# Patient Record
Sex: Female | Born: 1975 | Race: Black or African American | Hispanic: No | Marital: Single | Smoking: Former smoker
Health system: Southern US, Community
[De-identification: ages and names within clinical notes are randomized; demographics above are authoritative.]

## PROBLEM LIST (undated history)

## (undated) DIAGNOSIS — I2699 Other pulmonary embolism without acute cor pulmonale: Secondary | ICD-10-CM

## (undated) DIAGNOSIS — F329 Major depressive disorder, single episode, unspecified: Secondary | ICD-10-CM

## (undated) DIAGNOSIS — E559 Vitamin D deficiency, unspecified: Secondary | ICD-10-CM

## (undated) DIAGNOSIS — K5900 Constipation, unspecified: Secondary | ICD-10-CM

## (undated) DIAGNOSIS — Z91018 Allergy to other foods: Secondary | ICD-10-CM

## (undated) DIAGNOSIS — Z8489 Family history of other specified conditions: Secondary | ICD-10-CM

## (undated) DIAGNOSIS — G35 Multiple sclerosis: Secondary | ICD-10-CM

## (undated) DIAGNOSIS — F419 Anxiety disorder, unspecified: Secondary | ICD-10-CM

## (undated) DIAGNOSIS — G35D Multiple sclerosis, unspecified: Secondary | ICD-10-CM

## (undated) DIAGNOSIS — R001 Bradycardia, unspecified: Secondary | ICD-10-CM

## (undated) DIAGNOSIS — F988 Other specified behavioral and emotional disorders with onset usually occurring in childhood and adolescence: Secondary | ICD-10-CM

## (undated) DIAGNOSIS — M1990 Unspecified osteoarthritis, unspecified site: Secondary | ICD-10-CM

## (undated) DIAGNOSIS — R600 Localized edema: Secondary | ICD-10-CM

## (undated) DIAGNOSIS — E669 Obesity, unspecified: Secondary | ICD-10-CM

## (undated) DIAGNOSIS — F32A Depression, unspecified: Secondary | ICD-10-CM

## (undated) DIAGNOSIS — R569 Unspecified convulsions: Secondary | ICD-10-CM

## (undated) DIAGNOSIS — K219 Gastro-esophageal reflux disease without esophagitis: Secondary | ICD-10-CM

## (undated) DIAGNOSIS — R519 Headache, unspecified: Secondary | ICD-10-CM

## (undated) DIAGNOSIS — K829 Disease of gallbladder, unspecified: Secondary | ICD-10-CM

## (undated) DIAGNOSIS — I10 Essential (primary) hypertension: Secondary | ICD-10-CM

## (undated) HISTORY — DX: Localized edema: R60.0

## (undated) HISTORY — DX: Depression, unspecified: F32.A

## (undated) HISTORY — DX: Major depressive disorder, single episode, unspecified: F32.9

## (undated) HISTORY — DX: Obesity, unspecified: E66.9

## (undated) HISTORY — DX: Bradycardia, unspecified: R00.1

## (undated) HISTORY — DX: Anxiety disorder, unspecified: F41.9

## (undated) HISTORY — DX: Other specified behavioral and emotional disorders with onset usually occurring in childhood and adolescence: F98.8

## (undated) HISTORY — DX: Essential (primary) hypertension: I10

## (undated) HISTORY — PX: CHOLECYSTECTOMY: SHX55

## (undated) HISTORY — PX: KNEE SURGERY: SHX244

## (undated) HISTORY — DX: Allergy to other foods: Z91.018

## (undated) HISTORY — DX: Multiple sclerosis: G35

## (undated) HISTORY — DX: Disease of gallbladder, unspecified: K82.9

## (undated) HISTORY — DX: Constipation, unspecified: K59.00

## (undated) HISTORY — DX: Vitamin D deficiency, unspecified: E55.9

## (undated) HISTORY — DX: Unspecified osteoarthritis, unspecified site: M19.90

## (undated) HISTORY — DX: Multiple sclerosis, unspecified: G35.D

---

## 1999-02-06 ENCOUNTER — Emergency Department (HOSPITAL_COMMUNITY): Admission: EM | Admit: 1999-02-06 | Discharge: 1999-02-07 | Payer: Self-pay | Admitting: Emergency Medicine

## 1999-02-07 ENCOUNTER — Encounter: Payer: Self-pay | Admitting: Emergency Medicine

## 2001-04-18 ENCOUNTER — Other Ambulatory Visit: Admission: RE | Admit: 2001-04-18 | Discharge: 2001-04-18 | Payer: Self-pay | Admitting: Obstetrics and Gynecology

## 2001-12-14 ENCOUNTER — Inpatient Hospital Stay (HOSPITAL_COMMUNITY): Admission: AD | Admit: 2001-12-14 | Discharge: 2001-12-14 | Payer: Self-pay | Admitting: Obstetrics

## 2001-12-15 ENCOUNTER — Inpatient Hospital Stay (HOSPITAL_COMMUNITY): Admission: AD | Admit: 2001-12-15 | Discharge: 2001-12-15 | Payer: Self-pay | Admitting: Obstetrics & Gynecology

## 2001-12-17 ENCOUNTER — Inpatient Hospital Stay (HOSPITAL_COMMUNITY): Admission: AD | Admit: 2001-12-17 | Discharge: 2001-12-17 | Payer: Self-pay | Admitting: *Deleted

## 2012-09-19 DIAGNOSIS — R569 Unspecified convulsions: Secondary | ICD-10-CM | POA: Insufficient documentation

## 2012-09-19 DIAGNOSIS — G40909 Epilepsy, unspecified, not intractable, without status epilepticus: Secondary | ICD-10-CM | POA: Insufficient documentation

## 2012-09-20 DIAGNOSIS — G43909 Migraine, unspecified, not intractable, without status migrainosus: Secondary | ICD-10-CM | POA: Insufficient documentation

## 2014-03-05 DIAGNOSIS — F1910 Other psychoactive substance abuse, uncomplicated: Secondary | ICD-10-CM | POA: Insufficient documentation

## 2014-05-31 ENCOUNTER — Ambulatory Visit: Payer: Medicaid Other | Attending: Specialist

## 2014-05-31 DIAGNOSIS — G35 Multiple sclerosis: Secondary | ICD-10-CM | POA: Diagnosis not present

## 2014-05-31 DIAGNOSIS — G8190 Hemiplegia, unspecified affecting unspecified side: Secondary | ICD-10-CM | POA: Insufficient documentation

## 2014-05-31 DIAGNOSIS — IMO0001 Reserved for inherently not codable concepts without codable children: Secondary | ICD-10-CM | POA: Insufficient documentation

## 2014-08-30 ENCOUNTER — Emergency Department (HOSPITAL_COMMUNITY)
Admission: EM | Admit: 2014-08-30 | Discharge: 2014-08-30 | Disposition: A | Discharge status: Home / Self Care | Payer: Medicaid Other | Attending: Emergency Medicine | Admitting: Emergency Medicine

## 2014-08-30 ENCOUNTER — Emergency Department (HOSPITAL_COMMUNITY): Payer: Medicaid Other

## 2014-08-30 ENCOUNTER — Encounter (HOSPITAL_COMMUNITY): Payer: Self-pay | Admitting: Emergency Medicine

## 2014-08-30 DIAGNOSIS — Z87891 Personal history of nicotine dependence: Secondary | ICD-10-CM | POA: Insufficient documentation

## 2014-08-30 DIAGNOSIS — S0093XA Contusion of unspecified part of head, initial encounter: Secondary | ICD-10-CM

## 2014-08-30 DIAGNOSIS — S0003XA Contusion of scalp, initial encounter: Secondary | ICD-10-CM | POA: Insufficient documentation

## 2014-08-30 DIAGNOSIS — S0990XA Unspecified injury of head, initial encounter: Secondary | ICD-10-CM | POA: Diagnosis present

## 2014-08-30 DIAGNOSIS — Y92009 Unspecified place in unspecified non-institutional (private) residence as the place of occurrence of the external cause: Secondary | ICD-10-CM | POA: Insufficient documentation

## 2014-08-30 DIAGNOSIS — Z8669 Personal history of other diseases of the nervous system and sense organs: Secondary | ICD-10-CM | POA: Diagnosis not present

## 2014-08-30 DIAGNOSIS — Z3202 Encounter for pregnancy test, result negative: Secondary | ICD-10-CM | POA: Insufficient documentation

## 2014-08-30 DIAGNOSIS — R7989 Other specified abnormal findings of blood chemistry: Secondary | ICD-10-CM | POA: Insufficient documentation

## 2014-08-30 DIAGNOSIS — R259 Unspecified abnormal involuntary movements: Secondary | ICD-10-CM | POA: Diagnosis not present

## 2014-08-30 DIAGNOSIS — S1093XA Contusion of unspecified part of neck, initial encounter: Principal | ICD-10-CM

## 2014-08-30 DIAGNOSIS — Y9389 Activity, other specified: Secondary | ICD-10-CM | POA: Insufficient documentation

## 2014-08-30 DIAGNOSIS — S0083XA Contusion of other part of head, initial encounter: Principal | ICD-10-CM | POA: Insufficient documentation

## 2014-08-30 DIAGNOSIS — W010XXA Fall on same level from slipping, tripping and stumbling without subsequent striking against object, initial encounter: Secondary | ICD-10-CM | POA: Diagnosis not present

## 2014-08-30 DIAGNOSIS — F1110 Opioid abuse, uncomplicated: Secondary | ICD-10-CM | POA: Insufficient documentation

## 2014-08-30 DIAGNOSIS — R945 Abnormal results of liver function studies: Secondary | ICD-10-CM

## 2014-08-30 DIAGNOSIS — W19XXXA Unspecified fall, initial encounter: Secondary | ICD-10-CM

## 2014-08-30 DIAGNOSIS — G8190 Hemiplegia, unspecified affecting unspecified side: Secondary | ICD-10-CM

## 2014-08-30 DIAGNOSIS — N390 Urinary tract infection, site not specified: Secondary | ICD-10-CM | POA: Diagnosis not present

## 2014-08-30 HISTORY — DX: Unspecified convulsions: R56.9

## 2014-08-30 LAB — URINALYSIS, ROUTINE W REFLEX MICROSCOPIC
Bilirubin Urine: NEGATIVE
GLUCOSE, UA: NEGATIVE mg/dL
HGB URINE DIPSTICK: NEGATIVE
Ketones, ur: NEGATIVE mg/dL
Nitrite: POSITIVE — AB
PH: 6.5 (ref 5.0–8.0)
PROTEIN: NEGATIVE mg/dL
Specific Gravity, Urine: 1.013 (ref 1.005–1.030)
Urobilinogen, UA: 1 mg/dL (ref 0.0–1.0)

## 2014-08-30 LAB — DIFFERENTIAL
Basophils Absolute: 0 10*3/uL (ref 0.0–0.1)
Basophils Relative: 0 % (ref 0–1)
EOS PCT: 0 % (ref 0–5)
Eosinophils Absolute: 0 10*3/uL (ref 0.0–0.7)
LYMPHS PCT: 20 % (ref 12–46)
Lymphs Abs: 1.5 10*3/uL (ref 0.7–4.0)
Monocytes Absolute: 0.7 10*3/uL (ref 0.1–1.0)
Monocytes Relative: 10 % (ref 3–12)
Neutro Abs: 5.1 10*3/uL (ref 1.7–7.7)
Neutrophils Relative %: 70 % (ref 43–77)

## 2014-08-30 LAB — COMPREHENSIVE METABOLIC PANEL
ALT: 115 U/L — ABNORMAL HIGH (ref 0–35)
AST: 254 U/L — AB (ref 0–37)
Albumin: 3.5 g/dL (ref 3.5–5.2)
Alkaline Phosphatase: 72 U/L (ref 39–117)
Anion gap: 9 (ref 5–15)
BUN: 9 mg/dL (ref 6–23)
CALCIUM: 8.5 mg/dL (ref 8.4–10.5)
CO2: 27 mEq/L (ref 19–32)
Chloride: 102 mEq/L (ref 96–112)
Creatinine, Ser: 0.94 mg/dL (ref 0.50–1.10)
GFR calc Af Amer: 89 mL/min — ABNORMAL LOW (ref >90)
GFR calc non Af Amer: 77 mL/min — ABNORMAL LOW (ref >90)
Glucose, Bld: 91 mg/dL (ref 70–99)
Potassium: 3.7 mEq/L (ref 3.7–5.3)
SODIUM: 138 meq/L (ref 137–147)
TOTAL PROTEIN: 6.6 g/dL (ref 6.0–8.3)
Total Bilirubin: 0.8 mg/dL (ref 0.3–1.2)

## 2014-08-30 LAB — I-STAT TROPONIN, ED: TROPONIN I, POC: 0 ng/mL (ref 0.00–0.08)

## 2014-08-30 LAB — PROTIME-INR
INR: 1.16 (ref 0.00–1.49)
PROTHROMBIN TIME: 14.8 s (ref 11.6–15.2)

## 2014-08-30 LAB — URINE MICROSCOPIC-ADD ON

## 2014-08-30 LAB — RAPID URINE DRUG SCREEN, HOSP PERFORMED
AMPHETAMINES: NOT DETECTED
BENZODIAZEPINES: NOT DETECTED
Barbiturates: NOT DETECTED
Cocaine: NOT DETECTED
OPIATES: POSITIVE — AB
Tetrahydrocannabinol: NOT DETECTED

## 2014-08-30 LAB — CBC
HEMATOCRIT: 37.8 % (ref 36.0–46.0)
Hemoglobin: 12.8 g/dL (ref 12.0–15.0)
MCH: 31.4 pg (ref 26.0–34.0)
MCHC: 33.9 g/dL (ref 30.0–36.0)
MCV: 92.9 fL (ref 78.0–100.0)
PLATELETS: 227 10*3/uL (ref 150–400)
RBC: 4.07 MIL/uL (ref 3.87–5.11)
RDW: 13.8 % (ref 11.5–15.5)
WBC: 7.3 10*3/uL (ref 4.0–10.5)

## 2014-08-30 LAB — POC URINE PREG, ED: Preg Test, Ur: NEGATIVE

## 2014-08-30 LAB — APTT: aPTT: 29 seconds (ref 24–37)

## 2014-08-30 MED ORDER — LORAZEPAM 1 MG PO TABS
0.5000 mg | ORAL_TABLET | Freq: Two times a day (BID) | ORAL | Status: DC
  Filled 2014-08-30: qty 1

## 2014-08-30 MED ORDER — DEXTROSE 5 % IV SOLN
1.0000 g | INTRAVENOUS | Status: DC
  Administered 2014-08-30: 1 g via INTRAVENOUS
  Filled 2014-08-30: qty 10

## 2014-08-30 MED ORDER — OXYCODONE HCL 5 MG PO TABS: 5.0000 mg | ORAL_TABLET | ORAL | Status: DC | PRN

## 2014-08-30 MED ORDER — LORAZEPAM 1 MG PO TABS
1.0000 mg | ORAL_TABLET | Freq: Once | ORAL | Status: AC
  Administered 2014-08-30: 1 mg via ORAL

## 2014-08-30 MED ORDER — DIPHENHYDRAMINE HCL 50 MG/ML IJ SOLN
25.0000 mg | Freq: Once | INTRAMUSCULAR | Status: AC
  Administered 2014-08-30: 25 mg via INTRAVENOUS
  Filled 2014-08-30: qty 1

## 2014-08-30 MED ORDER — MORPHINE SULFATE 4 MG/ML IJ SOLN
4.0000 mg | Freq: Once | INTRAMUSCULAR | Status: AC
  Administered 2014-08-30: 4 mg via INTRAVENOUS
  Filled 2014-08-30: qty 1

## 2014-08-30 MED ORDER — LORAZEPAM 1 MG PO TABS
1.0000 mg | ORAL_TABLET | Freq: Two times a day (BID) | ORAL | Status: DC
  Filled 2014-08-30: qty 1

## 2014-08-30 MED ORDER — ESCITALOPRAM OXALATE 10 MG PO TABS
20.0000 mg | ORAL_TABLET | Freq: Every day | ORAL | Status: DC
  Administered 2014-08-30: 20 mg via ORAL
  Filled 2014-08-30: qty 2

## 2014-08-30 MED ORDER — CEPHALEXIN 500 MG PO CAPS: 500.0000 mg | ORAL_CAPSULE | Freq: Two times a day (BID) | ORAL | Status: DC

## 2014-08-30 NOTE — ED Notes (Signed)
Pt states she was in her bathroom when she fell into the tub hitting her head on the soap holder. Pt now c/o pain in the back of her head. Pt has C-Collar in place.

## 2014-08-30 NOTE — ED Provider Notes (Addendum)
Patient presented to the ER with fall and head injury. Patient reports falling in the shower earlier today, hitting the left side of her head on the soap holder. No loss of consciousness. Patient with headache and left-sided neck pain.  Face to face Exam: HEENT - PERRLA, contusion left occiput Lungs - CTAB Heart - RRR, no M/R/G Abd - S/NT/ND Neuro - alert, oriented x3. Lateral nystagmus, plus minus rotational nystagmus, difficulty with left lateral gaze in the left eye, possible deficit  Plan:  Present to the ER for evaluation of possible head injury. She had a fall this morning, causing fall is unclear at this time. Patient complaining of headache and neck pain. CT scan of head and neck were unremarkable. Examination, however, reveal some nystagmus the patient reports that she has been having double vision and feeling like her Hyzaar causing. She does have nystagmus and some irritability with leftward gaze in the left eye. Patient does, however, have a previous history of CVA. Obtain neurology consult for further evaluation.  Procedure: Venapuncture Area was prepped with chlorhexidine. Right antecubital fossa vein was identified using ultrasound guidance. 20-gauge butterfly was advanced under visualization with ultrasound into the vein, 10 mL so blood was collected. The patient tolerated procedure, no complications.  Gilda Crease, MD 08/30/14 1113  Gilda Crease, MD 08/30/14 [phone]

## 2014-08-30 NOTE — Consult Note (Signed)
Referring Physician: Pollina    Chief Complaint: fall with symptoms of right leg weakness   HPI:                                                                                                                                         Ebony Warren is an 38 y.o. female who presents to ED after she fell into her bath tube. Patient states she had a stroke in 2008 with residual right leg weakness and decreased sensation but per mother she had minimal right leg weakness. In 2013 she states she had a seizure.  Her mother at bedside states she had a full neurological work up in [place] at [facility].  At that time they were told her seizures were not "from the brain but stress induced."  Today she was hanging a wash cloth on a hanger located in the bath tube when she fell into the tube.  She did not lose consciousness and was alert the whole time.  She states she is unclear why she fell. After her fall she noted she had right leg weakness, double vision when looking to the left but only when she did not have her glasses on.  When glasses on, her vision was normal. Her mother noted her speech was not baseline but stuttering.  She has had similar speech problem "when under stress and each time her speech will clear up once the stress is cleared from her life"--per mother. Neurology was called to evaluate patient.   Date last known well: Date: 08/30/2014 Time last known well: Time: 08:00 tPA Given: No: minimal symptoms  Past Medical History  Diagnosis Date  . Seizures     told that they are not from the brain but stress related    Past Surgical History  Procedure Laterality Date  . Cholecystectomy    . Knee surgery      Family History  Problem Relation Age of Onset  . Hypertension Mother   . Hypertension Father    Social History:  reports that she quit smoking about 8 years ago. She does not have any smokeless tobacco history on file. She reports that she does not drink alcohol. Her drug  history is not on file.  Allergies:  Allergies  Allergen Reactions  . Percocet [Oxycodone-Acetaminophen]     Stomach cramps   . Vicodin [Hydrocodone-Acetaminophen]     Stomach cramps     Medications:  Current Facility-Administered Medications  Medication Dose Route Frequency Provider Last Rate Last Dose  . escitalopram (LEXAPRO) tablet 20 mg  20 mg Oral Daily Mellody Drown, PA-C   20 mg at 08/30/14 1117  . LORazepam (ATIVAN) tablet 1 mg  1 mg Oral BID Mellody Drown, PA-C       Current Outpatient Prescriptions  Medication Sig Dispense Refill  . acetaminophen (TYLENOL) 500 MG tablet Take 500 mg by mouth every 8 (eight) hours as needed (pain).      Marland Kitchen acetaminophen-codeine (TYLENOL #3) 300-30 MG per tablet Take 1 tablet by mouth every 6 (six) hours as needed for moderate pain.      Marland Kitchen escitalopram (LEXAPRO) 20 MG tablet Take 20 mg by mouth daily.      Marland Kitchen ibuprofen (ADVIL,MOTRIN) 800 MG tablet Take 800 mg by mouth every 8 (eight) hours as needed (pain).      . LORazepam (ATIVAN) 0.5 MG tablet Take 0.5 mg by mouth 2 (two) times daily.        ROS:                                                                                                                                       History obtained from the patient  General ROS: negative for - chills, fatigue, fever, night sweats, weight gain or weight loss Psychological ROS: negative for - behavioral disorder, hallucinations, memory difficulties, mood swings or suicidal ideation Ophthalmic ROS: negative for - blurry vision, double vision, eye pain or loss of vision ENT ROS: negative for - epistaxis, nasal discharge, oral lesions, sore throat, tinnitus or vertigo Allergy and Immunology ROS: negative for - hives or itchy/watery eyes Hematological and Lymphatic ROS: negative for - bleeding problems, bruising or swollen lymph  nodes Endocrine ROS: negative for - galactorrhea, hair pattern changes, polydipsia/polyuria or temperature intolerance Respiratory ROS: negative for - cough, hemoptysis, shortness of breath or wheezing Cardiovascular ROS: negative for - chest pain, dyspnea on exertion, edema or irregular heartbeat Gastrointestinal ROS: negative for - abdominal pain, diarrhea, hematemesis, nausea/vomiting or stool incontinence Genito-Urinary ROS: negative for - dysuria, hematuria, incontinence or urinary frequency/urgency Musculoskeletal ROS: negative for - joint swelling or muscular weakness Neurological ROS: as noted in HPI Dermatological ROS: negative for rash and skin lesion changes  Neurologic Examination:                                                                                                      Blood pressure 120/95, pulse  55, temperature 97.6 F (36.4 C), temperature source Oral, resp. rate 14, last menstrual period 08/23/2014, SpO2 100.00%.   General: NAD Mental Status: Alert, oriented, thought content appropriate.  Speech stuttering and with a lisp without evidence of aphasia.  Able to follow 3 step commands without difficulty. Cranial Nerves: II: Discs flat bilaterally; Visual fields grossly normal, pupils equal, round, reactive to light and accommodation III,IV, VI: ptosis not present, extra-ocular motions intact bilaterally V,VII: smile symmetric (initially lifted right corner of mouth but when distracted moved face symmetrically), facial light touch sensation normal bilaterally VIII: hearing normal bilaterally IX,X: gag reflex present XI: bilateral shoulder shrug XII: midline tongue extension without atrophy or fasciculations  Motor: Right : Upper extremity   5/5    Left:     Upper extremity   5/5  Lower extremity   5/5-give way weakness  Lower extremity   5/5 Tone and bulk:normal tone throughout; no atrophy noted Sensory: Pinprick and light touch decreased on the right leg  (old) Deep Tendon Reflexes:  Right: Upper Extremity   Left: Upper extremity   biceps (C-5 to C-6) 2/4   biceps (C-5 to C-6) 2/4 tricep (C7) 2/4    triceps (C7) 2/4 Brachioradialis (C6) 2/4  Brachioradialis (C6) 2/4  Lower Extremity Lower Extremity  quadriceps (L-2 to L-4) 2/4   quadriceps (L-2 to L-4) 2/4 Achilles (S1) 2/4   Achilles (S1) 2/4  Plantars: Right: downgoing   Left: downgoing Cerebellar: normal finger-to-nose,  normal heel-to-shin test  Gait: not tested.  CV: pulses palpable throughout    Lab Results: Basic Metabolic Panel: No results found for this basename: NA, K, CL, CO2, GLUCOSE, BUN, CREATININE, CALCIUM, MG, PHOS,  in the last 168 hours  Liver Function Tests: No results found for this basename: AST, ALT, ALKPHOS, BILITOT, PROT, ALBUMIN,  in the last 168 hours No results found for this basename: LIPASE, AMYLASE,  in the last 168 hours No results found for this basename: AMMONIA,  in the last 168 hours  CBC: No results found for this basename: WBC, NEUTROABS, HGB, HCT, MCV, PLT,  in the last 168 hours  Cardiac Enzymes: No results found for this basename: CKTOTAL, CKMB, CKMBINDEX, TROPONINI,  in the last 168 hours  Lipid Panel: No results found for this basename: CHOL, TRIG, HDL, CHOLHDL, VLDL, LDLCALC,  in the last 168 hours  CBG: No results found for this basename: GLUCAP,  in the last 168 hours  Microbiology: No results found for this or any previous visit.  Coagulation Studies: No results found for this basename: LABPROT, INR,  in the last 72 hours  Imaging: Ct Head Wo Contrast  08/30/2014   CLINICAL DATA:  Recent fall with head and neck pain  EXAM: CT HEAD WITHOUT CONTRAST  CT CERVICAL SPINE WITHOUT CONTRAST  TECHNIQUE: Multidetector CT imaging of the head and cervical spine was performed following the standard protocol without intravenous contrast. Multiplanar CT image reconstructions of the cervical spine were also generated.  COMPARISON:  None.   FINDINGS: CT HEAD FINDINGS  The bony calvarium is intact. The ventricles are of normal size and configuration. No findings to suggest acute hemorrhage, acute infarction or space-occupying mass lesion are noted.  CT CERVICAL SPINE FINDINGS  Seven cervical segments are well visualized. Vertebral body height is well maintained. No acute fracture or acute facet abnormality is noted. The surrounding soft tissues are within normal limits. The visualized lung apices are unremarkable.  IMPRESSION: CT of the head:  No acute intracranial abnormality  is noted.  CT of the cervical spine:  No acute abnormality noted.   Electronically Signed   By: Alcide Clever M.D.   On: 08/30/2014 09:42   Ct Cervical Spine Wo Contrast  08/30/2014   CLINICAL DATA:  Recent fall with head and neck pain  EXAM: CT HEAD WITHOUT CONTRAST  CT CERVICAL SPINE WITHOUT CONTRAST  TECHNIQUE: Multidetector CT imaging of the head and cervical spine was performed following the standard protocol without intravenous contrast. Multiplanar CT image reconstructions of the cervical spine were also generated.  COMPARISON:  None.  FINDINGS: CT HEAD FINDINGS  The bony calvarium is intact. The ventricles are of normal size and configuration. No findings to suggest acute hemorrhage, acute infarction or space-occupying mass lesion are noted.  CT CERVICAL SPINE FINDINGS  Seven cervical segments are well visualized. Vertebral body height is well maintained. No acute fracture or acute facet abnormality is noted. The surrounding soft tissues are within normal limits. The visualized lung apices are unremarkable.  IMPRESSION: CT of the head:  No acute intracranial abnormality is noted.  CT of the cervical spine:  No acute abnormality noted.   Electronically Signed   By: Alcide Clever M.D.   On: 08/30/2014 09:42    Felicie Morn PA-C [facility] [phone]  08/30/2014, 12:23 PM   Assessment: 38 y.o. female S/P fall with complaints of diplopia when looking to  the left (only when not wearing glasses), stuttering speech and right leg weakness.  Neurological examination is not static and changes with each examiner.  Head CT reviewed and shows no acute changes. Patient on no antiplatelet therapy at home.    Stroke Risk Factors - none  Recommendations: 1.  Patient without evidence of bleed or cervical cord trauma from fall.  Not clear if patient has had another ischemic event with inconsistent examination.  Patient does have risk factors.  Would perform MRI of the brain without contrast.  If MRI unremarkable for an acute event no further neurological testing is recommended.  If evidence of an acute event, admission and further work up recommended.  Will follow up results of imaging.   2.  ASA  daily  Case discussed with Darnelle Maffucci   Thana Farr, MD [facility] [phone] 08/30/2014  12:53 PM

## 2014-08-30 NOTE — Discharge Instructions (Signed)
Call for a follow up appointment with a Family or Primary Care Provider for further evaluation of your elevated liver enzymes. Call a neurologist. Return if Symptoms worsen.   Take medication as prescribed.  Drink plenty of fluid.

## 2014-08-30 NOTE — ED Notes (Signed)
IV team at bedside 

## 2014-08-30 NOTE — ED Notes (Signed)
Difficulty getting blood.  RN x 2 unsuccessful.   Phlebotomy x 2 attempts unsuccessful.

## 2014-08-30 NOTE — ED Provider Notes (Signed)
CSN: [id number]     Arrival date & time 08/30/14  0810 History   First MD Initiated Contact with Patient 08/30/14 0813     Chief Complaint  Patient presents with  . 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 Patient reports while in the shower she slipped and hit her head against the soap water. Mom reports the patient broke the dispenser with her left lower head/neck. The patient denies syncope, lightheadedness. The patient reports visual symptoms as feeling "cross eyed".  Patient denies nausea, vomiting, patient denies increase in weakness or paresthesias. Per the patient's mother the patient is at her baseline while in pain, increasing trouble with talking.  Pt denies seizure prior to fall. Normally ambulates with cane.  Patient is a 38 y.o. female presenting with fall. The history is provided by the patient. No language interpreter was used.  Fall Associated symptoms include headaches and neck pain. Pertinent negatives include no abdominal pain, chills, fever, nausea or vomiting.    Past Medical History  Diagnosis Date  . Seizures    Past Surgical History  Procedure Laterality Date  . Cholecystectomy    . Knee surgery     History reviewed. No pertinent family history. History  Substance Use Topics  . Smoking status: Former Smoker    Quit date: 12/07/2005  . Smokeless tobacco: Not on file  . Alcohol Use: No   OB History   Grav Para Term Preterm Abortions TAB SAB Ect Mult Living                 Review of Systems  Constitutional: Negative for fever and chills.  Eyes: Positive for visual disturbance.  Gastrointestinal: Negative for nausea, vomiting and abdominal pain.  Musculoskeletal: Positive for neck pain.  Skin: Negative  for wound.  Neurological: Positive for headaches. Negative for light-headedness.      Allergies  Percocet and Vicodin  Home Medications   Prior to Admission medications   Not on File   BP 113/74  Pulse 52  Temp(Src) 98.3 F (36.8 C) (Oral)  Resp 16  SpO2 100%  LMP 08/23/2014 Physical Exam  Nursing note and vitals reviewed. Constitutional: She is oriented to person, place, and time. She appears well-developed and well-nourished. She does not have a sickly appearance. She does not appear ill. Cervical collar in place.  HENT:  Head: Normocephalic. Head is with contusion. Head is without laceration.    Right Ear: No hemotympanum.  Left Ear: No hemotympanum.  Eyes: Pupils are equal, round, and reactive to light.  Pulmonary/Chest: Effort normal. No respiratory distress.  Neurological: She is alert and oriented to person, place, and time. She is not disoriented. She displays tremor. No sensory deficit. She exhibits abnormal muscle tone. She displays no seizure activity. GCS eye subscore is 4. GCS verbal subscore is 5. GCS motor subscore is 6.  Speech is hesitant and slow. Cranial Nerves:   II-Visual fields were normal.   III/IV/VI-Pupils were equal and reacted. Extraocular movements were full and conjugate. The patient had mild horizontal nystagmus with left lateral gaze of the left eye.  No nystagmus of right eye. No gaze preference. V/VII-no facial droop VIII-normal.   Motor: Right upper extremity 3/5. Right lower extremity 3/5. Left 5/5. Sensory: Normal tactile sensation to upper and loser extremities.  Cerebellar: Moderately impaired finger  with tremors bilaterally. Positive pronator drift on Right.  Skin: She is not diaphoretic.    ED Course  Procedures (including critical care time) Labs Review Labs Reviewed - No data to display  Results for orders placed during the hospital encounter of 08/30/14  URINE CULTURE      Result Value Ref Range   Specimen Description  URINE, CLEAN CATCH     Special Requests NONE     Culture  Setup Time       Value: 08/30/2014 19:38     Performed at [facility] Count       Value: >=100,000 COLONIES/ML     Performed at [facility]   Culture       Value: ESCHERICHIA COLI     Performed at [facility]   Report Status PENDING    PROTIME-INR      Result Value Ref Range   Prothrombin Time 14.8  11.6 - 15.2 seconds   INR 1.16  0.00 - 1.49  APTT      Result Value Ref Range   aPTT 29  24 - 37 seconds  CBC      Result Value Ref Range   WBC 7.3  4.0 - 10.5 K/uL   RBC 4.07  3.87 - 5.11 MIL/uL   Hemoglobin 12.8  12.0 - 15.0 g/dL   HCT 99.3  57.0 - 17.7 %   MCV 92.9  78.0 - 100.0 fL   MCH 31.4  26.0 - 34.0 pg   MCHC 33.9  30.0 - 36.0 g/dL   RDW 93.9  03.0 - 09.2 %   Platelets 227  150 - 400 K/uL  DIFFERENTIAL      Result Value Ref Range   Neutrophils Relative % 70  43 - 77 %   Neutro Abs 5.1  1.7 - 7.7 K/uL   Lymphocytes Relative 20  12 - 46 %   Lymphs Abs 1.5  0.7 - 4.0 K/uL   Monocytes Relative 10  3 - 12 %   Monocytes Absolute 0.7  0.1 - 1.0 K/uL   Eosinophils Relative 0  0 - 5 %   Eosinophils Absolute 0.0  0.0 - 0.7 K/uL   Basophils Relative 0  0 - 1 %   Basophils Absolute 0.0  0.0 - 0.1 K/uL  COMPREHENSIVE METABOLIC PANEL      Result Value Ref Range   Sodium 138  137 - 147 mEq/L   Potassium 3.7  3.7 - 5.3 mEq/L   Chloride 102  96 - 112 mEq/L   CO2 27  19 - 32 mEq/L   Glucose, Bld 91  70 - 99 mg/dL   BUN 9  6 - 23 mg/dL   Creatinine, Ser 3.30  0.50 - 1.10 mg/dL   Calcium 8.5  8.4 - 07.6 mg/dL   Total Protein 6.6  6.0 - 8.3 g/dL   Albumin 3.5  3.5 - 5.2 g/dL   AST 226 (*) 0 - 37 U/L   ALT 115 (*) 0 - 35 U/L   Alkaline Phosphatase 72  39 - 117 U/L   Total Bilirubin 0.8  0.3 - 1.2 mg/dL   GFR calc non Af Amer 77 (*) >90 mL/min   GFR calc Af Amer 89 (*) >90 mL/min   Anion gap 9  5 - 15  URINE RAPID DRUG SCREEN (HOSP PERFORMED)      Result Value Ref Range   Opiates  POSITIVE (*) NONE DETECTED  Cocaine NONE DETECTED  NONE DETECTED   Benzodiazepines NONE DETECTED  NONE DETECTED   Amphetamines NONE DETECTED  NONE DETECTED   Tetrahydrocannabinol NONE DETECTED  NONE DETECTED   Barbiturates NONE DETECTED  NONE DETECTED  URINALYSIS, ROUTINE W REFLEX MICROSCOPIC      Result Value Ref Range   Color, Urine YELLOW  YELLOW   APPearance HAZY (*) CLEAR   Specific Gravity, Urine 1.013  1.005 - 1.030   pH 6.5  5.0 - 8.0   Glucose, UA NEGATIVE  NEGATIVE mg/dL   Hgb urine dipstick NEGATIVE  NEGATIVE   Bilirubin Urine NEGATIVE  NEGATIVE   Ketones, ur NEGATIVE  NEGATIVE mg/dL   Protein, ur NEGATIVE  NEGATIVE mg/dL   Urobilinogen, UA 1.0  0.0 - 1.0 mg/dL   Nitrite POSITIVE (*) NEGATIVE   Leukocytes, UA SMALL (*) NEGATIVE  URINE MICROSCOPIC-ADD ON      Result Value Ref Range   Squamous Epithelial / LPF MANY (*) RARE   WBC, UA 3-6  <3 WBC/hpf   RBC / HPF 0-2  <3 RBC/hpf   Bacteria, UA MANY (*) RARE  I-STAT TROPOININ, ED      Result Value Ref Range   Troponin i, poc 0.00  0.00 - 0.08 ng/mL   Comment 3           POC URINE PREG, ED      Result Value Ref Range   Preg Test, Ur NEGATIVE  NEGATIVE   Ct Head Wo Contrast  08/30/2014   CLINICAL DATA:  Recent fall with head and neck pain  EXAM: CT HEAD WITHOUT CONTRAST  CT CERVICAL SPINE WITHOUT CONTRAST  TECHNIQUE: Multidetector CT imaging of the head and cervical spine was performed following the standard protocol without intravenous contrast. Multiplanar CT image reconstructions of the cervical spine were also generated.  COMPARISON:  None.  FINDINGS: CT HEAD FINDINGS  The bony calvarium is intact. The ventricles are of normal size and configuration. No findings to suggest acute hemorrhage, acute infarction or space-occupying mass lesion are noted.  CT CERVICAL SPINE FINDINGS  Seven cervical segments are well visualized. Vertebral body height is well maintained. No acute fracture or acute facet abnormality is noted. The  surrounding soft tissues are within normal limits. The visualized lung apices are unremarkable.  IMPRESSION: CT of the head:  No acute intracranial abnormality is noted.  CT of the cervical spine:  No acute abnormality noted.   Electronically Signed   By: Alcide Clever M.D.   On: 08/30/2014 09:42   Ct Cervical Spine Wo Contrast  08/30/2014   CLINICAL DATA:  Recent fall with head and neck pain  EXAM: CT HEAD WITHOUT CONTRAST  CT CERVICAL SPINE WITHOUT CONTRAST  TECHNIQUE: Multidetector CT imaging of the head and cervical spine was performed following the standard protocol without intravenous contrast. Multiplanar CT image reconstructions of the cervical spine were also generated.  COMPARISON:  None.  FINDINGS: CT HEAD FINDINGS  The bony calvarium is intact. The ventricles are of normal size and configuration. No findings to suggest acute hemorrhage, acute infarction or space-occupying mass lesion are noted.  CT CERVICAL SPINE FINDINGS  Seven cervical segments are well visualized. Vertebral body height is well maintained. No acute fracture or acute facet abnormality is noted. The surrounding soft tissues are within normal limits. The visualized lung apices are unremarkable.  IMPRESSION: CT of the head:  No acute intracranial abnormality is noted.  CT of the cervical spine:  No acute abnormality noted.  Electronically Signed   By: Alcide Clever M.D.   On: 08/30/2014 09:42   Mr Brain Wo Contrast  08/30/2014   CLINICAL DATA:  38 year old female with right side weakness, fall in bathtub. Initial encounter. Patient reports stroke in 2008 with residual right lower extremity weakness.  EXAM: MRI HEAD WITHOUT CONTRAST  TECHNIQUE: Multiplanar, multiecho pulse sequences of the brain and surrounding structures were obtained without intravenous contrast.  COMPARISON:  Head CT without contrast 0919 hr today.  FINDINGS: Cerebral volume is normal. No restricted diffusion to suggest acute infarction. No midline shift, mass  effect, evidence of mass lesion, ventriculomegaly, extra-axial collection or acute intracranial hemorrhage. Cervicomedullary junction and pituitary are within normal limits. Negative visualized cervical spine. Major intracranial vascular flow voids are preserved.  There is a small focus of subcortical white matter encephalomalacia in the left frontal lobe best seen on series 5, image 18. There are 1 or 2 other areas of nonspecific more central, periventricular white matter T2 and FLAIR hyperintensity. No cortical encephalomalacia identified. No chronic blood products identified in the brain. There is also mild T2 and T1 signal abnormality in the medial aspect of the left thalamus, best seen on series 11, image 58. Other deep gray matter nuclei appear within normal limits. There is also subtle evidence of abnormal signal in the left dorsal brainstem at the cervicomedullary junction (series 5, image 4, and possibly also continuing into the dorsal cervical spinal cord at C2 (series 5, image 1). These signal changes are also suggested on coronal T2 images, but subtle. Cerebellum within normal limits.  Visible internal auditory structures appear normal. Visualized paranasal sinuses and mastoids are clear. Visualized orbit soft tissues are within normal limits. Normal bone marrow signal. Visualized scalp soft tissues are within normal limits.  IMPRESSION: No acute infarct or acute intracranial abnormality identified, however, there there is scattered abnormal signal in the brain which could indicate either multifocal prior ischemia or sequelae of demyelinating disease.  Does the past medical history suggest the possibility of multiple sclerosis, or conversely does the patient have risk factors for stroke at this young age?  Consider also a source of paradoxic emboli (e.g. patent foramen ovale).   Electronically Signed   By: Augusto Gamble M.D.   On: 08/30/2014 15:45   Imaging Review No results found.   EKG  Interpretation   Date/Time:  Thursday August 30 2014 11:24:54 EDT Ventricular Rate:  55 PR Interval:  192 QRS Duration: 95 QT Interval:  586 QTC Calculation: 561 R Axis:   97 Text Interpretation:  Sinus rhythm Borderline right axis deviation Low  voltage, precordial leads Borderline T abnormalities, anterior leads  Prolonged QT interval No previous tracing Confirmed by POLLINA  MD,  CHRISTOPHER [phone]) on 08/30/2014 11:28:42 AM      MDM   Final diagnoses:  Head contusion, initial encounter  UTI (lower urinary tract infection)  Abnormal liver function test  Fall at home, initial encounter   Patient with history of CVA and right-sided weakness presents with new visual disturbances, exam shows right upper and right lower extremity weakness.  CT head C-spine without acute findings. 9:59 AM Discussed pt history, condition with Dr. Thad Ranger who agrees to evaluate the patient in the ED.  10:05 AM Reevaluation removed c-collar from patient. Discussed negative CT and head CT findings with the patient and patient's mother. Discussed neurology consult and the neurologist will evaluate patient in emergency department. Patient agrees to plan. Her initial reports were that she slipped in the  shower. However patient reports she does not know how she fell, remembers hitting head but not sure if she slipped, tripped. Neurology consulted and advises follow up out-patient after MRI, if negative. Discussed lab results, imaging results, and treatment plan with the patient. Return precautions given. Reports understanding and no other concerns at this time.  Patient is stable for discharge at this time. Meds given in ED:  Medications  morphine 4 MG/ML injection 4 mg (4 mg Intravenous Given 08/30/14 1102)  LORazepam (ATIVAN) tablet 1 mg (1 mg Oral Given 08/30/14 1006)  diphenhydrAMINE (BENADRYL) injection 25 mg (25 mg Intravenous Given 08/30/14 1111)    Discharge Medication List as of 08/30/2014  4:01  PM    START taking these medications   Details  cephALEXin (KEFLEX) 500 MG capsule Take 1 capsule (500 mg total) by mouth 2 (two) times daily., Starting 08/30/2014, Until Discontinued, Print    oxyCODONE (ROXICODONE) 5 MG immediate release tablet Take 1 tablet (5 mg total) by mouth every 4 (four) hours as needed for severe pain., Starting 08/30/2014, Until Discontinued, Print        Mellody Drown, PA-C 08/31/14 2212

## 2014-08-30 NOTE — ED Notes (Signed)
Used bedside comode but unable to void

## 2014-08-30 NOTE — ED Notes (Signed)
Attempted IV start, unsuccessful. IV team paged.

## 2014-08-30 NOTE — ED Notes (Signed)
Neurology at bedside.

## 2014-09-01 LAB — URINE CULTURE: Colony Count: >=100000

## 2014-09-01 NOTE — ED Provider Notes (Signed)
Medical screening examination/treatment/procedure(s) were conducted as a shared visit with non-physician practitioner(s) and myself.  I personally evaluated the patient during the encounter.  Please see separate associated note for evaluation and plan.    EKG Interpretation   Date/Time:  Thursday August 30 2014 11:24:54 EDT Ventricular Rate:  55 PR Interval:  192 QRS Duration: 95 QT Interval:  586 QTC Calculation: 561 R Axis:   97 Text Interpretation:  Sinus rhythm Borderline right axis deviation Low  voltage, precordial leads Borderline T abnormalities, anterior leads  Prolonged QT interval No previous tracing Confirmed by Blinda Leatherwood  MD,  CHRISTOPHER ([id number]) on 08/30/2014 11:28:42 AM       Gilda Crease, MD 09/01/14 [phone]

## 2014-09-03 ENCOUNTER — Telehealth (HOSPITAL_BASED_OUTPATIENT_CLINIC_OR_DEPARTMENT_OTHER): Payer: Self-pay

## 2014-09-03 NOTE — Telephone Encounter (Signed)
Post ED Visit - Positive Culture Follow-up  Culture report reviewed by antimicrobial stewardship pharmacist:  Wes Dulaney, Pharm.D., BCPS  Celedonio Miyamoto, Pharm.D., BCPS  Georgina Pillion, [address].D., BCPS  [place], [address].D., BCPS, AAHIVP  Estella Husk, Pharm.D., BCPS, AAHIVP  Carly Sabat, Pharm.D.  Enzo Bi, [address].D.  Positive Urine culture, >/=100,000 colonies -> Ecoli Treated with Cephalexin, organism sensitive to the same and no further patient follow-up is required at this time.  [name] 09/03/2014, 4:30 AM

## 2014-10-02 ENCOUNTER — Ambulatory Visit (INDEPENDENT_AMBULATORY_CARE_PROVIDER_SITE_OTHER): Payer: Medicaid Other | Admitting: Diagnostic Neuroimaging

## 2014-10-02 ENCOUNTER — Ambulatory Visit (INDEPENDENT_AMBULATORY_CARE_PROVIDER_SITE_OTHER): Payer: Medicaid Other

## 2014-10-02 ENCOUNTER — Other Ambulatory Visit: Payer: Self-pay | Admitting: Diagnostic Neuroimaging

## 2014-10-02 ENCOUNTER — Encounter: Payer: Self-pay | Admitting: Diagnostic Neuroimaging

## 2014-10-02 VITALS — BP 106/67 | HR 74 | Temp 97.8°F | Ht 68.0 in | Wt 217.0 lb

## 2014-10-02 DIAGNOSIS — H5500 Unspecified nystagmus: Secondary | ICD-10-CM

## 2014-10-02 DIAGNOSIS — R4781 Slurred speech: Secondary | ICD-10-CM

## 2014-10-02 DIAGNOSIS — H532 Diplopia: Secondary | ICD-10-CM

## 2014-10-02 DIAGNOSIS — R292 Abnormal reflex: Secondary | ICD-10-CM

## 2014-10-02 DIAGNOSIS — R29898 Other symptoms and signs involving the musculoskeletal system: Secondary | ICD-10-CM

## 2014-10-02 DIAGNOSIS — R930 Abnormal findings on diagnostic imaging of skull and head, not elsewhere classified: Secondary | ICD-10-CM

## 2014-10-02 DIAGNOSIS — R9089 Other abnormal findings on diagnostic imaging of central nervous system: Secondary | ICD-10-CM

## 2014-10-02 NOTE — Patient Instructions (Signed)
I will check additional testing (MRI and labs).   

## 2014-10-02 NOTE — Procedures (Signed)
    [facility]  VEP (VISUAL EVOKED POTENTIAL) REPORT   STUDY DATE: 10/02/14 PATIENT NAME: Ebony Warren DOB: November 22, 1976 MRN: [id number]  ORDERING CLINICIAN: Joycelyn Schmid, MD m  TECHNOLOGIST: Gearldine Shown TECHNIQUE: The visual evoked potential test was performed using 32 x 32 check sizes with full pattern reversal. CLINICAL INFORMATION: 38 year old female with abnormal MRI. Evaluate for demyelinating disease.  FINDINGS: The visual acuity was 20/70 OD and 20/100 OS.  There are well formed evoked potential wave forms bilaterally.   P100 latency with right eye stimulation: 106 ms.   P100 latency with left eye stimulation: 104 ms.  The amplitudes for the P100 waveforms were also borderline low with muscle artifact. Possibly related to poor patient concentration / fixation.   IMPRESSION:  This visual evoked potential study is normal. No definite evidence of conduction problems of the visual pathways at this time.   INTERPRETING PHYSICIAN:  Suanne Marker, MD Certified in Neurology, Neurophysiology and Neuroimaging  [facility] [address], [address] [place], [place] [address] [phone]

## 2014-10-02 NOTE — Progress Notes (Signed)
[facility]  PATIENT: Ebony Warren DOB: Feb 03, 1976  REFERRING CLINICIAN: Williams  HISTORY FROM: patient  REASON FOR VISIT: new consult    HISTORICAL  CHIEF COMPLAINT:  Chief Complaint  Patient presents with  . Fall  . Diplopia    HISTORY OF PRESENT ILLNESS:   38 year old right-handed female here for evaluation of double vision.  2008 patient was 6 months pregnant with her son, when all of a sudden she didn't feel good. She noticed right hand clumsiness and incoordination. This lasted for approximately 9 months and then stopped. She did not seek medical attention for this problem.  2013 patient had onset of right arm weakness, right leg weakness, numbness on the right side, intermittent shaking sensation all over. Patient was evaluated in [place] and diagnosed with possible stroke. 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 Patient did not recover right arm or leg strength. Her speech was also affected.  October 2014 patient moved to [facility].   08/30/2014 patient was at home hanging clothes over the bathtub when she fell down. She is not sure what caused her fall. Her right leg may have given out. Patient fell and struck her head against the soap dish which broke off. Patient did not lose consciousness. Patient's family advised her to go to the emergency room. Patient was evaluated with CT and MRI of the brain. No acute findings were found. MRI of the brain did show multiple white matter lesions suspicious for chronic multiple sclerosis versus chronic small vessel ischemic disease. Advised to follow-up in outpatient basis.  Since this fall patient has noticed some additional blurred vision, and double vision especially when looking to the left side.   REVIEW OF SYSTEMS: Full 14 system review of systems performed and notable only for memory loss confusion numbness weakness seizure tremor sleepiness depression anxiety joint pain joint swelling  cramps aching muscles feeling cold blurred vision double vision fatigue.  ALLERGIES: Allergies  Allergen Reactions  . Percocet [Oxycodone-Acetaminophen]     Stomach cramps   . Vicodin [Hydrocodone-Acetaminophen]     Stomach cramps   . Other Rash    narcotics    HOME MEDICATIONS: Outpatient Prescriptions Prior to Visit  Medication Sig Dispense Refill  . escitalopram (LEXAPRO) 20 MG tablet Take 20 mg by mouth daily.      Marland Kitchen ibuprofen (ADVIL,MOTRIN) 800 MG tablet Take 800 mg by mouth every 8 (eight) hours as needed (pain).      Marland Kitchen oxyCODONE (ROXICODONE) 5 MG immediate release tablet Take 1 tablet (5 mg total) by mouth every 4 (four) hours as needed for severe pain.  10 tablet  0  . cephALEXin (KEFLEX) 500 MG capsule Take 1 capsule (500 mg total) by mouth 2 (two) times daily.  10 capsule  0  . LORazepam (ATIVAN) 0.5 MG tablet Take 0.5 mg by mouth 2 (two) times daily.       No facility-administered medications prior to visit.    PAST MEDICAL HISTORY: Past Medical History  Diagnosis Date  . Seizures     told that they are not from the brain but stress related  . Bradycardia   . Anxiety and depression     PAST SURGICAL HISTORY: Past Surgical History  Procedure Laterality Date  . Cholecystectomy    . Knee surgery      FAMILY HISTORY: Family History  Problem Relation Age of Onset  . Hypertension Mother   . Atrial fibrillation Mother   . Hypertension Father   . Atrial fibrillation Maternal  Grandmother     SOCIAL HISTORY:  History   Social History  . Marital Status: Single    Spouse Name: N/A    Number of Children: 1  . Years of Education: College   Occupational History  .  Other    disabled   Social History Main Topics  . Smoking status: Former Smoker -- 0.35 packs/day for 3 years    Types: Cigarettes    Quit date: 12/07/2006  . Smokeless tobacco: Not on file  . Alcohol Use: No     Comment: quit: 2013 (socially)  . Drug Use: Yes    Special: Marijuana      Comment: quit: 2001   . Sexual Activity: Not on file   Other Topics Concern  . Not on file   Social History Narrative   Patient lives at home with family.   Caffeine Use: none     PHYSICAL EXAM  Filed Vitals:   10/02/14 1009  BP: 106/67  Pulse: 74  Temp: 97.8 F (36.6 C)  TempSrc: Oral  Height: 5\' 8"  (1.727 m)  Weight: 217 lb (98.431 kg)    Not recorded    Visual Acuity Screening   Right eye Left eye Both eyes  Without correction:     With correction: 20/100 20/70     Body mass index is 33 kg/(m^2).  GENERAL EXAM: Patient is in no distress; well developed, nourished and groomed; neck is supple  CARDIOVASCULAR: Regular rate and rhythm, no murmurs, no carotid bruits  NEUROLOGIC: MENTAL STATUS: awake, alert, oriented to person, place and time, recent and remote memory intact, normal attention and concentration, language fluent, comprehension intact, naming intact, fund of knowledge appropriate; SLOW SCANNING SPEECH PATTERN CRANIAL NERVE: no papilledema on fundoscopic exam, PUPILS PINPOINT AND REACTIVE; RIGHT EYE MORE SENSITIVE TO LIGHT; visual fields full to confrontation, extraocular muscles --> ON LEFT GAZE, RIGHT HAS INCOMPLETE ADDUCTION WITH LEFT EYE NYSTAGMUS AND SUBJECTIVE DOUBLE VISION; facial sensation symmetric, SUBTLE DECR LEFT NL FOLD; hearing intact, palate elevates symmetrically, uvula midline, shoulder shrug symmetric, tongue midline. MOTOR: INCR TONE IN RIGHT ARM AND RIGHT LEG; RUE 4+; RLE 3 PROX, 4 DISTAL; LUE AND LLE NORMAL SENSORY: DECR IN RIGHT HAND AND RIGHT FOOT TO ALL MODALITIES COORDINATION: RUE ATAXIA; LUE NORMAL REFLEXES: RUE 3+ WITH SPREAD;  LUE 3; RLE (KNEE 3+, ANKLE TRACE); LLE (KNEE 3, ANKLE 1); RIGHT TOE UPGOING GAIT/STATION: SPASTIC, HEMIPARETIC GAIT WITH RIGHT LEG WEAKNESS/STIFFNESS; UNSTEADY    DIAGNOSTIC DATA (LABS, IMAGING, TESTING) - I reviewed patient records, labs, notes, testing and imaging myself where available.  Lab Results    Component Value Date   WBC 7.3 08/30/2014   HGB 12.8 08/30/2014   HCT 37.8 08/30/2014   MCV 92.9 08/30/2014   PLT 227 08/30/2014      Component Value Date/Time   NA 138 08/30/2014 1031   K 3.7 08/30/2014 1031   CL 102 08/30/2014 1031   CO2 27 08/30/2014 1031   GLUCOSE 91 08/30/2014 1031   BUN 9 08/30/2014 1031   CREATININE 0.94 08/30/2014 1031   CALCIUM 8.5 08/30/2014 1031   PROT 6.6 08/30/2014 1031   ALBUMIN 3.5 08/30/2014 1031   AST 254* 08/30/2014 1031   ALT 115* 08/30/2014 1031   ALKPHOS 72 08/30/2014 1031   BILITOT 0.8 08/30/2014 1031   GFRNONAA 77* 08/30/2014 1031   GFRAA 89* 08/30/2014 1031   No results found for this basename: CHOL, HDL, LDLCALC, LDLDIRECT, TRIG, CHOLHDL   No results found for this  basename: HGBA1C   No results found for this basename: VITAMINB12   No results found for this basename: TSH    I reviewed images myself and agree with interpretation. -VRP  08/30/14 MRI BRAIN - No acute infarct or acute intracranial abnormality identified, however, there there is scattered abnormal signal in the brain which could indicate either multifocal prior ischemia or sequelae of demyelinating disease. Does the past medical history suggest the possibility of multiple sclerosis, or conversely does the patient have risk factors for stroke at this young age? Consider also a source of paradoxic emboli (e.g. patent foramen ovale). There is also subtle evidence of abnormal signal in the left dorsal brainstem at the cervicomedullary junction (series 5, image 4, and possibly also continuing into the dorsal cervical spinal cord at C2 (series 5, image 1). These signal changes are also suggested on coronal T2 images, but subtle.     ASSESSMENT AND PLAN  38 y.o. year old female here with multiple focal neurologic attacks since 2008. 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 MRI brain suspicious for chronic  demyelinating disease. Will pursue further workup.   PLAN:  Orders Placed This Encounter  Procedures  . MR Cervical Spine W Wo Contrast  . MR Thoracic Spine W Wo Contrast  . MR Brain W Contrast  . ANA w/Reflex  . Angiotensin converting enzyme  . Pan-ANCA  . HIV antibody (with reflex)  . RPR, Rfx Qn RPR/Confirm TP  . CBC With differential/Platelet  . Comprehensive metabolic panel  . Stratify JCV Antibody Test ([facility])  . Visual evoked potential test   Return in about 2 weeks (around 10/16/2014).    Suanne MarkerVIKRAM R. PENUMALLI, MD 10/02/2014, 11:32 AM Certified in Neurology, Neurophysiology and Neuroimaging  [facility] [address], [address] [place], [place] [address] [phone]

## 2014-10-03 LAB — CBC WITH DIFFERENTIAL
Basophils Absolute: 0 10*3/uL (ref 0.0–0.2)
Basos: 0 %
Eos: 2 %
Eosinophils Absolute: 0.1 10*3/uL (ref 0.0–0.4)
HEMATOCRIT: 39.7 % (ref 34.0–46.6)
Hemoglobin: 13.4 g/dL (ref 11.1–15.9)
IMMATURE GRANULOCYTES: 0 %
Immature Grans (Abs): 0 10*3/uL (ref 0.0–0.1)
LYMPHS ABS: 1.7 10*3/uL (ref 0.7–3.1)
Lymphs: 36 %
MCH: 31.5 pg (ref 26.6–33.0)
MCHC: 33.8 g/dL (ref 31.5–35.7)
MCV: 93 fL (ref 79–97)
MONOS ABS: 0.4 10*3/uL (ref 0.1–0.9)
Monocytes: 9 %
NEUTROS ABS: 2.5 10*3/uL (ref 1.4–7.0)
Neutrophils Relative %: 53 %
PLATELETS: 256 10*3/uL (ref 150–379)
RBC: 4.26 x10E6/uL (ref 3.77–5.28)
RDW: 13.1 % (ref 12.3–15.4)
WBC: 4.7 10*3/uL (ref 3.4–10.8)

## 2014-10-03 LAB — COMPREHENSIVE METABOLIC PANEL
ALBUMIN: 3.9 g/dL (ref 3.5–5.5)
ALT: 5 IU/L (ref 0–32)
AST: 13 IU/L (ref 0–40)
Albumin/Globulin Ratio: 1.5 (ref 1.1–2.5)
Alkaline Phosphatase: 51 IU/L (ref 39–117)
BILIRUBIN TOTAL: 0.4 mg/dL (ref 0.0–1.2)
BUN / CREAT RATIO: 12 (ref 8–20)
BUN: 10 mg/dL (ref 6–20)
CO2: 25 mmol/L (ref 18–29)
Calcium: 8.9 mg/dL (ref 8.7–10.2)
Chloride: 103 mmol/L (ref 97–108)
Creatinine, Ser: 0.81 mg/dL (ref 0.57–1.00)
GFR, EST AFRICAN AMERICAN: 108 mL/min/{1.73_m2} (ref >59)
GFR, EST NON AFRICAN AMERICAN: 94 mL/min/{1.73_m2} (ref >59)
GLOBULIN, TOTAL: 2.6 g/dL (ref 1.5–4.5)
Glucose: 67 mg/dL (ref 65–99)
Potassium: 3.9 mmol/L (ref 3.5–5.2)
SODIUM: 140 mmol/L (ref 134–144)
Total Protein: 6.5 g/dL (ref 6.0–8.5)

## 2014-10-03 LAB — RPR QUALITATIVE: SYPHILIS RPR SCR: NONREACTIVE

## 2014-10-03 LAB — HIV ANTIBODY (ROUTINE TESTING W REFLEX)
HIV 1/HIV 2 AB: NONREACTIVE
HIV 1/O/2 Abs-Index Value: <1 (ref <1.00)

## 2014-10-03 LAB — PAN-ANCA
Atypical pANCA: <1:20 {titer}
Myeloperoxidase Ab: <9 U/mL (ref 0.0–9.0)
P-ANCA: <1:20 {titer}

## 2014-10-03 LAB — ANGIOTENSIN CONVERTING ENZYME: Angio Convert Enzyme: 43 U/L (ref 14–82)

## 2014-10-03 LAB — ANA W/REFLEX: Anti Nuclear Antibody(ANA): NEGATIVE

## 2014-10-20 DIAGNOSIS — G35 Multiple sclerosis: Secondary | ICD-10-CM | POA: Insufficient documentation

## 2014-10-24 ENCOUNTER — Ambulatory Visit
Admission: RE | Admit: 2014-10-24 | Discharge: 2014-10-24 | Disposition: A | Discharge status: Home / Self Care | Payer: Medicaid Other | Source: Ambulatory Visit | Attending: Diagnostic Neuroimaging | Admitting: Diagnostic Neuroimaging

## 2014-10-24 ENCOUNTER — Ambulatory Visit: Payer: Medicaid Other | Admitting: Diagnostic Neuroimaging

## 2014-10-24 ENCOUNTER — Encounter (INDEPENDENT_AMBULATORY_CARE_PROVIDER_SITE_OTHER): Payer: Medicaid Other | Admitting: Diagnostic Neuroimaging

## 2014-10-24 DIAGNOSIS — H532 Diplopia: Secondary | ICD-10-CM

## 2014-10-24 DIAGNOSIS — R4781 Slurred speech: Secondary | ICD-10-CM

## 2014-10-24 DIAGNOSIS — H5500 Unspecified nystagmus: Secondary | ICD-10-CM

## 2014-10-24 DIAGNOSIS — R292 Abnormal reflex: Secondary | ICD-10-CM

## 2014-10-24 DIAGNOSIS — R9089 Other abnormal findings on diagnostic imaging of central nervous system: Secondary | ICD-10-CM

## 2014-10-24 DIAGNOSIS — R930 Abnormal findings on diagnostic imaging of skull and head, not elsewhere classified: Secondary | ICD-10-CM

## 2014-10-24 DIAGNOSIS — R29898 Other symptoms and signs involving the musculoskeletal system: Secondary | ICD-10-CM

## 2014-10-24 MED ORDER — GADOBENATE DIMEGLUMINE 529 MG/ML IV SOLN
14.0000 mL | Freq: Once | INTRAVENOUS | Status: AC | PRN
  Administered 2014-10-24: 14 mL via INTRAVENOUS

## 2014-10-31 ENCOUNTER — Encounter: Payer: Self-pay | Admitting: Diagnostic Neuroimaging

## 2014-10-31 ENCOUNTER — Ambulatory Visit (INDEPENDENT_AMBULATORY_CARE_PROVIDER_SITE_OTHER): Payer: Medicaid Other | Admitting: Diagnostic Neuroimaging

## 2014-10-31 VITALS — BP 113/74 | HR 53 | Temp 97.3°F | Ht 68.0 in | Wt 212.4 lb

## 2014-10-31 DIAGNOSIS — G35 Multiple sclerosis: Secondary | ICD-10-CM

## 2014-10-31 NOTE — Patient Instructions (Addendum)
I will check JCV antibody test. Depending on results. We will start tysabri or tecfidera.  See website: nmss.org for more information  Start daily multi-vitamin with vitamin D

## 2014-10-31 NOTE — Progress Notes (Signed)
[facility]  PATIENT: Ebony Warren DOB: 19-Aug-1976  REFERRING CLINICIAN: Williams  HISTORY FROM: patient  REASON FOR VISIT: new consult    HISTORICAL  CHIEF COMPLAINT:  Chief Complaint  Patient presents with  . Follow-up    Patient states that she is here for results of her test    HISTORY OF PRESENT ILLNESS:   UPDATE 10/31/14: Since last visit, sxs are stable. Test results and diagnosis reviewed. No new events. Using cane to walk.  UPDATE (10/02/14): 38 year old right-handed female here for evaluation of double vision. 2008 patient was 6 months pregnant with her son, when all of a sudden she didn't feel good. She noticed right hand clumsiness and incoordination. This lasted for approximately 9 months and then stopped. She did not seek medical attention for this problem. 2013 patient had onset of right arm weakness, right leg weakness, numbness on the right side, intermittent shaking sensation all over. Patient was evaluated in [place] and diagnosed with possible stroke. 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 Patient did not recover right arm or leg strength. Her speech was also affected. October 2014 patient moved to [facility]. 08/30/2014 patient was at home hanging clothes over the bathtub when she fell down. She is not sure what caused her fall. Her right leg may have given out. Patient fell and struck her head against the soap dish which broke off. Patient did not lose consciousness. Patient's family advised her to go to the emergency room. Patient was evaluated with CT and MRI of the brain. No acute findings were found. MRI of the brain did show multiple white matter lesions suspicious for chronic multiple sclerosis versus chronic small vessel ischemic disease. Advised to follow-up in outpatient basis. Since this fall patient has noticed some additional blurred vision, and double vision especially when looking to the left side.   REVIEW OF SYSTEMS:  Full 14 system review of systems performed and notable only for memory loss confusion numbness weakness seizure tremor sleepiness depression anxiety joint pain joint swelling cramps aching muscles feeling cold blurred vision double vision fatigue.  ALLERGIES: Allergies  Allergen Reactions  . Percocet [Oxycodone-Acetaminophen]     Stomach cramps   . Vicodin [Hydrocodone-Acetaminophen]     Stomach cramps   . Other Rash    narcotics    HOME MEDICATIONS: Outpatient Prescriptions Prior to Visit  Medication Sig Dispense Refill  . escitalopram (LEXAPRO) 20 MG tablet Take 20 mg by mouth daily.    Marland Kitchen LORazepam (ATIVAN) 1 MG tablet Take 1 mg by mouth daily.    Marland Kitchen ibuprofen (ADVIL,MOTRIN) 800 MG tablet Take 800 mg by mouth every 8 (eight) hours as needed (pain).    Marland Kitchen oxyCODONE (ROXICODONE) 5 MG immediate release tablet Take 1 tablet (5 mg total) by mouth every 4 (four) hours as needed for severe pain. 10 tablet 0   No facility-administered medications prior to visit.    PAST MEDICAL HISTORY: Past Medical History  Diagnosis Date  . Seizures     told that they are not from the brain but stress related  . Bradycardia   . Anxiety and depression     PAST SURGICAL HISTORY: Past Surgical History  Procedure Laterality Date  . Cholecystectomy    . Knee surgery      FAMILY HISTORY: Family History  Problem Relation Age of Onset  . Hypertension Mother   . Atrial fibrillation Mother   . Hypertension Father   . Atrial fibrillation Maternal Grandmother     SOCIAL HISTORY:  History   Social History  . Marital Status: Single    Spouse Name: N/A    Number of Children: 1  . Years of Education: College   Occupational History  .  Other    disabled   Social History Main Topics  . Smoking status: Former Smoker -- 0.35 packs/day for 3 years    Types: Cigarettes    Quit date: 12/07/2006  . Smokeless tobacco: Not on file  . Alcohol Use: No     Comment: quit: 2013 (socially)  . Drug  Use: Yes    Special: Marijuana     Comment: quit: 2001   . Sexual Activity: Not on file   Other Topics Concern  . Not on file   Social History Narrative   Patient lives at home with family.   Caffeine Use: none   Patient is right handed.   Patient has a college education     PHYSICAL EXAM  Filed Vitals:   10/31/14 1358  BP: 113/74  Pulse: 53  Temp: 97.3 F (36.3 C)  Height: 5\' 8"  (1.727 m)  Weight: 212 lb 6 oz (96.333 kg)    Visual Acuity Screening   Right eye Left eye Both eyes  Without correction: 20/100 20/100 20/200  With correction: 20/20 20/40 20/30      Body mass index is 32.3 kg/(m^2).  GENERAL EXAM: Patient is in no distress; well developed, nourished and groomed; neck is supple  CARDIOVASCULAR: Regular rate and rhythm, no murmurs, no carotid bruits  NEUROLOGIC: MENTAL STATUS: awake, alert, language fluent, comprehension intact, naming intact, fund of knowledge appropriate; SLOW SCANNING SPEECH PATTERN CRANIAL NERVE: no papilledema on fundoscopic exam, PUPILS PINPOINT AND REACTIVE; RIGHT EYE MORE SENSITIVE TO LIGHT; visual fields full to confrontation, extraocular muscles --> ON LEFT GAZE, RIGHT HAS INCOMPLETE ADDUCTION WITH LEFT EYE NYSTAGMUS AND SUBJECTIVE DOUBLE VISION; facial sensation symmetric, SUBTLE DECR LEFT NL FOLD; DECR RIGHT EYE BROW RAISE,  hearing intact, palate elevates symmetrically, uvula midline, shoulder shrug symmetric, tongue midline. MOTOR: INCR TONE IN RIGHT ARM AND RIGHT LEG; RUE 4+; RLE 3 PROX, 4 DISTAL; LUE AND LLE NORMAL SENSORY: DECR IN RIGHT HAND AND RIGHT FOOT TO ALL MODALITIES COORDINATION: RUE ATAXIA; LUE NORMAL REFLEXES: RUE 3+ WITH SPREAD;  LUE 3; RLE (KNEE 3+, ANKLE TRACE); LLE (KNEE 3, ANKLE 1); RIGHT TOE UPGOING GAIT/STATION: SPASTIC, HEMIPARETIC GAIT WITH RIGHT LEG WEAKNESS/STIFFNESS; UNSTEADY; USING SINGLE POINT CANE.    DIAGNOSTIC DATA (LABS, IMAGING, TESTING) - I reviewed patient records, labs, notes, testing and  imaging myself where available.  Lab Results  Component Value Date   WBC 4.7 10/02/2014   HGB 13.4 10/02/2014   HCT 39.7 10/02/2014   MCV 93 10/02/2014   PLT 256 10/02/2014      Component Value Date/Time   NA 140 10/02/2014 1236   NA 138 08/30/2014 1031   K 3.9 10/02/2014 1236   CL 103 10/02/2014 1236   CO2 25 10/02/2014 1236   GLUCOSE 67 10/02/2014 1236   GLUCOSE 91 08/30/2014 1031   BUN 10 10/02/2014 1236   BUN 9 08/30/2014 1031   CREATININE 0.81 10/02/2014 1236   CALCIUM 8.9 10/02/2014 1236   PROT 6.5 10/02/2014 1236   PROT 6.6 08/30/2014 1031   ALBUMIN 3.5 08/30/2014 1031   AST 13 10/02/2014 1236   ALT 5 10/02/2014 1236   ALKPHOS 51 10/02/2014 1236   BILITOT 0.4 10/02/2014 1236   GFRNONAA 94 10/02/2014 1236   GFRAA 108 10/02/2014 1236   No  results found for: CHOL No results found for: HGBA1C No results found for: VITAMINB12 No results found for: TSH  I reviewed images myself and agree with interpretation. -VRP  08/30/14 MRI BRAIN - No acute infarct or acute intracranial abnormality identified, however, there there is scattered abnormal signal in the brain which could indicate either multifocal prior ischemia or sequelae of demyelinating disease. Does the past medical history suggest the possibility of multiple sclerosis, or conversely does the patient have risk factors for stroke at this young age? Consider also a source of paradoxic emboli (e.g. patent foramen ovale). There is also subtle evidence of abnormal signal in the left dorsal brainstem at the cervicomedullary junction (series 5, image 4, and possibly also continuing into the dorsal cervical spinal cord at C2 (series 5, image 1). These signal changes are also suggested on coronal T2 images, but subtle.   10/24/14 MRI brain (with) 1. Few round and ovoid peri-callosal and subcortical T2 hyperintense plaques, suspicious for demyelinating disease. Other autoimmune, inflammatory, post-infectious or microvascular  etiologies are possible. 2. No abnormal enhancing lesions.  10/24/14 MRI cervical spine (with and without)  1. Multiple T2 hyperintense spinal cord lesion from cervico-medullary junction down to C7 level (~8 lesions), more posteriorly in the spinal cord. Findings suspicious for chronic demyelinating disease. 2. No abnormal enhancing lesions.  Labs - ANA, ANCA, ACE, HIV, RPR - all negative   ASSESSMENT AND PLAN  38 y.o. year old female here with multiple focal neurologic attacks since 2008. 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 MRI brain and cervical spine suspicious for chronic demyelinating disease. Other labs negative.  PLAN: - check JCV antibody - depending on results, will start tysabri or tecfidera - PT evaluation - check vitamin D level   Orders Placed This Encounter  Procedures  . Stratify JCV Antibody Test ([facility])  . Ambulatory referral to Physical Therapy   Return in about 3 months (around 01/31/2015).    Suanne MarkerVIKRAM R. PENUMALLI, MD 10/31/2014, 2:45 PM Certified in Neurology, Neurophysiology and Neuroimaging  [facility] [address], [address] [place], [place] [address] [phone]

## 2014-11-01 LAB — VITAMIN D 25 HYDROXY (VIT D DEFICIENCY, FRACTURES): VIT D 25 HYDROXY: 15.1 ng/mL — AB (ref 30.0–100.0)

## 2014-11-08 ENCOUNTER — Telehealth: Payer: Self-pay | Admitting: Diagnostic Neuroimaging

## 2014-11-08 NOTE — Telephone Encounter (Signed)
All clinical info was submitted to ins, request is currently under review.  I called back.  They are aware.

## 2014-11-08 NOTE — Telephone Encounter (Signed)
Jackelyn PolingFranchesca with [facility] is calling to follow up on a prior authorization for Tecfidera for patient.  Please call. Thank you.

## 2014-11-12 ENCOUNTER — Telehealth: Payer: Self-pay | Admitting: Diagnostic Neuroimaging

## 2014-11-12 NOTE — Telephone Encounter (Signed)
Crystal with [employer] is calling because the fax you sent over for patient for pre authorization for Tecfidera maintenance dose did not completely come through. Please refax. Thank you.

## 2014-11-12 NOTE — Telephone Encounter (Signed)
Ebony Warren with [facility] @ [phone], requesting a return call regarding Tecfidera Prior Authorization request.  Please call and advise.

## 2014-11-12 NOTE — Telephone Encounter (Signed)
Request has been re-faxed.

## 2014-11-12 NOTE — Telephone Encounter (Signed)
Patient requesting blood work results and based on results, what medication does she start?  Please call and advise.

## 2014-11-12 NOTE — Telephone Encounter (Signed)
I called back.  She is aware request is pending.  (Her fax number is [phone])

## 2014-11-13 ENCOUNTER — Telehealth: Payer: Self-pay

## 2014-11-13 NOTE — Telephone Encounter (Signed)
pls appeal for tecfidera. She has aggressive course of MS and significant symptoms. -VRP

## 2014-11-13 NOTE — Telephone Encounter (Signed)
Franchesca from [facility] calling stating [place] was denied and for appeal you need to call [phone].

## 2014-11-13 NOTE — Telephone Encounter (Signed)
Medicaid has denied our request for coverage on Tecfidera.  They indicate the patient must first have a documented trial and failure of at least two preferred products which include: Avonex, Copaxone, Rebif or Betaseron.  Would you like to change to a preferred alternative, or try to appeal denial?  Please advise.  Thank you.

## 2014-11-13 NOTE — Telephone Encounter (Signed)
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 Will proceed with tecfidera. -VRP

## 2014-11-13 NOTE — Telephone Encounter (Signed)
I called back again.  After about 15 rings someone pick up and asked me to hold.  I waited on the line for several minutes, and no one came back to the phone.  Call then disconnected.  I called again, and no one answered.  No voicemail option.

## 2014-11-13 NOTE — Telephone Encounter (Signed)
I called the number provided.  Phone rang over 20 times with no answer, no voicemail.  I will try again later.

## 2014-11-13 NOTE — Telephone Encounter (Signed)
Front desk asked me to call the patient back.  I did so.  She inquired if she was JCV positive, and per this note, I relayed she is.  Patient inquired about therapy.  I advised we are working with insurance to try and get approval for Tecfidera.  (Explained they had denied coverage and we are appealing this denial)  She verbalized understanding.  Patient said she is having a great deal of pain in her legs when standing or trying to walk.  She would like to know if something could be prescribed to help with her leg pain.  Please advise.  Thank you.

## 2014-11-13 NOTE — Telephone Encounter (Signed)
I called again.  Was transferred to Pharmacy Dept.  [place] SpanielSaid they will be sending appeal instructions to us.  They were unable to provide this info via phone, indicating each medication has a different procedure.  We will proceed with this as soon as the info has been received.

## 2014-11-13 NOTE — Telephone Encounter (Signed)
Called patient.  No answer.

## 2014-11-13 NOTE — Telephone Encounter (Signed)
Patient returning Ebony Warren, CPHT call.  Please return call at home # [phone], if no answer call mobile # [phone].

## 2014-11-13 NOTE — Telephone Encounter (Signed)
I have not called this patient.  I called back to see if I could assist her.  She was referring to a call she got from [place], not myself.  She asked if she was JCV positive, and by viewing the previous note, she is.  She also asked if she would be proceeding with Tecfidera.  I explained her ins has denied our request for coverage, but we re appealing the denial.  Patient verbalized understanding.

## 2014-11-15 NOTE — Telephone Encounter (Signed)
Our portion of the appeal has been completed and faxed back to [phone] Attn Amy.

## 2014-11-16 NOTE — Telephone Encounter (Signed)
Ebony Warren with [facility] called back this morning stating that the prior auth was denied for Teferderia and that the appeal needs to be faxed to them at [phone]. You can call her back at [phone].

## 2014-11-16 NOTE — Telephone Encounter (Signed)
Copy of appeal info has been faxed to [facility]

## 2014-11-19 NOTE — Telephone Encounter (Signed)
I called patient x 2; no answer. -VRP

## 2014-11-20 ENCOUNTER — Telehealth: Payer: Self-pay | Admitting: Diagnostic Neuroimaging

## 2014-11-20 NOTE — Telephone Encounter (Signed)
Pt is returning Dr. Richrd Humbles call.  Please try cell number first if no answer please call [phone] (mother) she can be reached at either number.

## 2014-11-21 ENCOUNTER — Telehealth: Payer: Self-pay | Admitting: Diagnostic Neuroimaging

## 2014-11-21 NOTE — Telephone Encounter (Signed)
Abran Duke with [facility] is calling to get a copy of the completed prior authorization form for Tecfidera faxed to her at [phone]. Thank you.

## 2014-11-21 NOTE — Telephone Encounter (Signed)
Info has been faxed.

## 2014-11-22 ENCOUNTER — Encounter: Payer: Self-pay | Admitting: Diagnostic Neuroimaging

## 2014-11-26 NOTE — Telephone Encounter (Signed)
Info has been resent

## 2014-11-26 NOTE — Telephone Encounter (Signed)
Abran Duke with [facility] is calling again because she has not received prior authorization form for Tecfidera. Please refax to [phone]. Thank you.

## 2014-11-27 ENCOUNTER — Telehealth: Payer: Self-pay | Admitting: *Deleted

## 2014-11-27 NOTE — Telephone Encounter (Signed)
Additional clinical info was already sent to [facility].  The request is currently being re-reviewed.  I called the patient back.  She is aware.  As well, we have faxed a copy of the denials to [employer] for their records.  If the appeal is denied, they should be able to get patient some sort of assistance for this med.

## 2014-12-10 NOTE — Telephone Encounter (Signed)
Can you follow up on status of patient and tecfidera? Setup follow up appt if needed to discuss. -VRP

## 2014-12-10 NOTE — Telephone Encounter (Signed)
I called Medicaid appeal dept at [phone].  Was transferred to a voicemail.  I left message asking for a call back regarding patient's appeal.  I called Abran Duke back at [facility], got no answer.  Left message.  I called Medicaid PA line at [phone].  Spoke with Patience.  She said they have denied the appeal effective 12/23, and will not allow coverage unless patient has documented trial and failure of 2 preferred drugs.  She said they have mailed a denial letter, but we likely will not get it until later this week, and they will not fax [name] a copy of this letter.  I called [employer] at [phone].  Spoke with [place].  She placed me on hold for over 30 minutes while she checked with multiple depts regarding this patient's situation.  When she came back to the line, she said the patient will have to call them in order to request financial assistance, as they cannot reach out to the patient to offer this service.  I called the patient back.  She will contact [employer] and call us back if anything further is needed.

## 2014-12-10 NOTE — Telephone Encounter (Signed)
Patient says she has not heard from [employer], but specialty [facility] is awaiting 2nd appeal of insurance denial. I called [employer] and representative confirmed still awaiting appeal resolution.

## 2014-12-11 NOTE — Telephone Encounter (Signed)
Annette with [place] [employer] is calling regarding the patient. Drinda Butts wanted to let you know that the patient has Appeal rights. If additional information is needed please call. Thank you,.

## 2014-12-12 ENCOUNTER — Telehealth: Payer: Self-pay | Admitting: Diagnostic Neuroimaging

## 2014-12-12 NOTE — Telephone Encounter (Signed)
Patient is aware of appeal denial as of 01/04.  She is in contact with [employer] regarding financial assistance with this drug.  Patient will call us back if anything further is needed.  (this info is documented in a previous encounter)

## 2014-12-12 NOTE — Telephone Encounter (Signed)
Calling back to say that her appeal has been denied.  If you have any questions please call and advise.

## 2014-12-17 NOTE — Telephone Encounter (Signed)
I called back.  She is aware patient has referred to [employer] to try and obtain assistance.

## 2014-12-17 NOTE — Telephone Encounter (Signed)
Ebony Warren with [facility] is calling again to f/u on appeal status for Tecfidera.

## 2014-12-21 ENCOUNTER — Ambulatory Visit: Payer: Medicaid Other

## 2014-12-25 ENCOUNTER — Encounter: Payer: Self-pay | Admitting: Physical Therapy

## 2014-12-25 ENCOUNTER — Ambulatory Visit: Payer: Medicaid Other | Attending: Diagnostic Neuroimaging | Admitting: Physical Therapy

## 2014-12-25 DIAGNOSIS — R269 Unspecified abnormalities of gait and mobility: Secondary | ICD-10-CM | POA: Diagnosis not present

## 2014-12-25 DIAGNOSIS — R29898 Other symptoms and signs involving the musculoskeletal system: Secondary | ICD-10-CM | POA: Insufficient documentation

## 2014-12-25 NOTE — Therapy (Signed)
[facility] [address] [place], [place], [address] Phone: [phone]   Fax:  [phone]  Physical Therapy Evaluation  Patient Details  Name: Josetta Feig MRN: [id number] Date of Birth: [birth year] Referring Provider:  Suanne Marker, MD  Encounter Date: 12/25/2014      PT End of Session - 12/25/14 1440    Visit Number 1   Number of Visits 9   Date for PT Re-Evaluation 01/31/15   Authorization Type Medicaid   PT Start Time 1016   PT Stop Time 1102   PT Time Calculation (min) 46 min      Past Medical History  Diagnosis Date  . Seizures     told that they are not from the brain but stress related  . Bradycardia   . Anxiety and depression     Past Surgical History  Procedure Laterality Date  . Cholecystectomy    . Knee surgery      There were no vitals taken for this visit.  Visit Diagnosis:  Right leg weakness  Abnormality of gait      Subjective Assessment - 12/25/14 1654    Symptoms Pt. reports symptoms of gait difficulty and speech problems started in Oct. 2013; pt. reports she received PT in [place], [place] and improved; symptoms re-occurred in July 2014 and has been using quad cane since that time   Pertinent History  diagnosed with relapsing/remitting MS in Nov. 2015 after sustaining fall in her bathroom and hitting her head - went to ED and CT scan was performed; pt. was referred to neurologist after this episode and diagnosed with MS          [facility] PT Assessment - 12/25/14 1032    Assessment   Medical Diagnosis Multiple Sclerosis   Onset Date --  Oct. 2015 - fell and hit head in her bathroom   Next MD Visit Feb. 26, 2016   Prior Therapy 2013 in [place], [place]   Precautions   Precautions Fall   Balance Screen   Has the patient fallen in the past 6 months Yes   How many times? 40 plus   Has the patient had a decrease in activity level because of a fear of falling?  Yes   Is the  patient reluctant to leave their home because of a fear of falling?  No   Home Environment   Living Enviornment Private residence   Home Access Stairs to enter   Entrance Stairs-Number of Steps 4   Entrance Stairs-Rails None   Home Layout One level   Strength   Right Hip Flexion 3-/5   Right Knee Flexion 3+/5   Right Knee Extension 4-/5   Ambulation/Gait   Gait velocity 1.56  21.03 secs   Berg Balance Test   Sit to Stand Able to stand without using hands and stabilize independently   Standing Unsupported Able to stand safely 2 minutes   Sitting with Back Unsupported but Feet Supported on Floor or Stool Able to sit safely and securely 2 minutes   Stand to Sit Sits safely with minimal use of hands   Transfers Able to transfer safely, minor use of hands   Standing Unsupported with Eyes Closed Able to stand 10 seconds with supervision   Standing Ubsupported with Feet Together Able to place feet together independently and stand for 1 minute with supervision   From Standing, Reach Forward with Outstretched Arm Can reach confidently >25 cm (10")   From Standing Position,  Pick up Object from Floor Unable to try/needs assist to keep balance   From Standing Position, Turn to Look Behind Over each Shoulder Looks behind from both sides and weight shifts well   Turn 360 Degrees Needs close supervision or verbal cueing  L=6.87   R=6.29   Standing Unsupported, Alternately Place Feet on Step/Stool Able to stand independently and safely and complete 8 steps in 20 seconds   Standing Unsupported, One Foot in Front Able to place foot tandem independently and hold 30 seconds   Standing on One Leg Able to lift leg independently and hold 5-10 seconds  close S with LOB at end of 30 secs   Total Score 46   Timed Up and Go Test   Normal TUG (seconds) 23.72  with quad cane                          PT Education - 12/25/14 1439    Education provided Yes   Education Details discussed  recommendation for use of RW to decr. fall risk - will cont. to assess need - pt. states she has a RW she could use   Person(s) Educated Patient   Methods Explanation   Comprehension Verbalized understanding          PT Short Term Goals - 12/25/14 1640    PT SHORT TERM GOAL #1   Title Same as LTG's           PT Long Term Goals - 12/25/14 1649    Additional Long Term Goals   Additional Long Term Goals Yes   PT LONG TERM GOAL #6   Title Independent in HEP for strengthening and balance   Baseline Dependent    Time 4   Period Weeks   Status New               Plan - 12/25/14 1635    Pt will benefit from skilled therapeutic intervention in order to improve on the following deficits Decreased balance;Decreased mobility;Decreased strength;Abnormal gait;Decreased endurance;Decreased activity tolerance;Decreased coordination;Increased muscle spasms   Rehab Potential Good   PT Frequency 2x / week   PT Duration 4 weeks   PT Treatment/Interventions ADLs/Self Care Home Management;Therapeutic activities;Patient/family education;Therapeutic exercise;Gait training;Balance training;Stair training;Neuromuscular re-education;Functional mobility training   PT Next Visit Plan HEP for RLE strengthening; gait training with RW   PT Home Exercise Plan R hip strengthening, hamstring strengthening   Recommended Other Services possible AFO   Consulted and Agree with Plan of Care Patient         Problem List Patient Active Problem List   Diagnosis Date Noted  . Multiple sclerosis 10/31/2014  . Hemiplegia, unspecified, affecting dominant side 08/30/2014    Kateleen Encarnacion, Donavan Burnet, PT 12/25/2014, 4:55 PM  [facility] [address] [place], [place], [address] Phone: [phone]   Fax:  [phone]

## 2015-01-04 ENCOUNTER — Telehealth: Payer: Self-pay | Admitting: *Deleted

## 2015-01-04 NOTE — Telephone Encounter (Signed)
Spoke with pts mother, asked her to have Steward call the office and change her follow up appt since Dr. Marjory Lies will not be in the office 02/01/15

## 2015-01-08 ENCOUNTER — Telehealth: Payer: Self-pay | Admitting: *Deleted

## 2015-01-08 NOTE — Telephone Encounter (Signed)
Patient calling stating that she is on week 2 for Ectodermal and now she is experiencing UTI symptoms. Patient was not sure if this is a side affect form the medication or not. Patient did not want to call her PCP until after hearing back from here. Please advise

## 2015-01-09 ENCOUNTER — Telehealth: Payer: Self-pay | Admitting: *Deleted

## 2015-01-09 NOTE — Telephone Encounter (Signed)
Spoke with the pt on the phone. Explained that a UTI is not a side effect of Tecfidera. I asked her to follow-up with her PCP to get tested for a UTI and get medication if needed. Pt asked if she could take ibuprophen with Tecfidera and I stated that it would be ok. Told her to call back if it did not help. Pt stated an understanding and says thank you. I will talk with Dr. Marjory Lies about potentially starting a muscle relaxer.

## 2015-01-09 NOTE — Telephone Encounter (Signed)
Samantha, pls contact patient. UTI is not a side effect of tecfidera (not Ectodermal). She should be tested for UTI. -VRP

## 2015-01-16 ENCOUNTER — Telehealth: Payer: Self-pay | Admitting: *Deleted

## 2015-01-16 NOTE — Telephone Encounter (Signed)
Spoke with the pt on the phone and got her appt rescheduled  

## 2015-01-25 ENCOUNTER — Encounter: Payer: Self-pay | Admitting: Diagnostic Neuroimaging

## 2015-01-25 ENCOUNTER — Ambulatory Visit (INDEPENDENT_AMBULATORY_CARE_PROVIDER_SITE_OTHER): Payer: Medicaid Other | Admitting: Diagnostic Neuroimaging

## 2015-01-25 VITALS — BP 113/76 | HR 64 | Ht 67.0 in | Wt 203.4 lb

## 2015-01-25 DIAGNOSIS — G35 Multiple sclerosis: Secondary | ICD-10-CM

## 2015-01-25 MED ORDER — BACLOFEN 10 MG PO TABS: 10.0000 mg | ORAL_TABLET | Freq: Three times a day (TID) | ORAL | Status: DC | PRN

## 2015-01-25 NOTE — Patient Instructions (Signed)
Start vitamin D supplement (2000 units per day).  Start general multivitamin daily.  I will set up home physical therapy.  I will set up roller walker with seat and brakes.  Continue Tecfidera.  Try baclofen 10 mg twice a day up to 3 times per day as needed for muscle spasm.

## 2015-01-25 NOTE — Progress Notes (Signed)
[facility]  PATIENT: Ebony Warren DOB: 1976/02/18  REFERRING CLINICIAN: Williams  HISTORY FROM: patient  REASON FOR VISIT: follow up   HISTORICAL  CHIEF COMPLAINT:  Chief Complaint  Patient presents with  . Follow-up    multiple sclerosis    HISTORY OF PRESENT ILLNESS:   UPDATE 01/25/15: Since last visit, had more problems for a few weeks (lower ext weakness, bladder incont), but now improved. Now on tecfidera x 3 weeks. Some itching and stomach issues with tecfidera, but mild. Main issues now consist of lower extremity pain, spasms, urinary leakage, daytime fatigue.  UPDATE 10/31/14: Since last visit, sxs are stable. Test results and diagnosis reviewed. No new events. Using cane to walk.  UPDATE (10/02/14): 39 year old right-handed female here for evaluation of double vision. 2008 patient was 6 months pregnant with her son, when all of a sudden she didn't feel good. She noticed right hand clumsiness and incoordination. This lasted for approximately 9 months and then stopped. She did not seek medical attention for this problem. 2013 patient had onset of right arm weakness, right leg weakness, numbness on the right side, intermittent shaking sensation all over. Patient was evaluated in [place] and diagnosed with possible stroke. 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 Patient did not recover right arm or leg strength. Her speech was also affected. October 2014 patient moved to [facility]. 08/30/2014 patient was at home hanging clothes over the bathtub when she fell down. She is not sure what caused her fall. Her right leg may have given out. Patient fell and struck her head against the soap dish which broke off. Patient did not lose consciousness. Patient's family advised her to go to the emergency room. Patient was evaluated with CT and MRI of the brain. No acute findings were found. MRI of the brain did show multiple white matter lesions suspicious for  chronic multiple sclerosis versus chronic small vessel ischemic disease. Advised to follow-up in outpatient basis. Since this fall patient has noticed some additional blurred vision, and double vision especially when looking to the left side.   REVIEW OF SYSTEMS: Full 14 system review of systems performed and notable only for fatigue hearing loss leg swelling blurred vision double vision cold intolerance flushing incontinent bladder joint pain joint swelling muscle cramps gait difficulty neck stiffness confusion depression anxiety tremor weakness speech difficulty numbness headache dizziness memory loss.   ALLERGIES: Allergies  Allergen Reactions  . Percocet [Oxycodone-Acetaminophen]     Stomach cramps   . Vicodin [Hydrocodone-Acetaminophen]     Stomach cramps   . Other Rash    narcotics    HOME MEDICATIONS: Outpatient Prescriptions Prior to Visit  Medication Sig Dispense Refill  . escitalopram (LEXAPRO) 20 MG tablet Take 20 mg by mouth daily.    Marland Kitchen LORazepam (ATIVAN) 1 MG tablet Take 1 mg by mouth daily.     No facility-administered medications prior to visit.    PAST MEDICAL HISTORY: Past Medical History  Diagnosis Date  . Seizures     told that they are not from the brain but stress related  . Bradycardia   . Anxiety and depression     PAST SURGICAL HISTORY: Past Surgical History  Procedure Laterality Date  . Cholecystectomy    . Knee surgery      FAMILY HISTORY: Family History  Problem Relation Age of Onset  . Hypertension Mother   . Atrial fibrillation Mother   . Hypertension Father   . Atrial fibrillation Maternal Grandmother  SOCIAL HISTORY:  History   Social History  . Marital Status: Single    Spouse Name: N/A  . Number of Children: 1  . Years of Education: College   Occupational History  .  Other    disabled   Social History Main Topics  . Smoking status: Former Smoker -- 0.35 packs/day for 3 years    Types: Cigarettes    Quit date:  12/07/2006  . Smokeless tobacco: Not on file  . Alcohol Use: No     Comment: quit: 2013 (socially)  . Drug Use: Yes    Special: Marijuana     Comment: quit: 2001   . Sexual Activity: Not on file   Other Topics Concern  . Not on file   Social History Narrative   Patient lives at home with family.   Caffeine Use: drinks a cup of hot tea with tecfidera in the am   Patient is right handed.   Patient has a college education     PHYSICAL EXAM  Filed Vitals:   01/25/15 1116  BP: 113/76  Pulse: 64  Height:  (1.702 m)  Weight: 203 lb 6.4 oz (92.262 kg)   No exam data present   Body mass index is 31.85 kg/(m^2).  GENERAL EXAM: Patient is in no distress; well developed, nourished and groomed; neck is supple  CARDIOVASCULAR: Regular rate and rhythm, no murmurs, no carotid bruits  NEUROLOGIC: MENTAL STATUS: awake, alert, language fluent, comprehension intact, naming intact, fund of knowledge appropriate; SLOW SCANNING SPEECH PATTERN CRANIAL NERVE: no papilledema on fundoscopic exam, PUPILS PINPOINT AND REACTIVE; RIGHT EYE MORE SENSITIVE TO LIGHT; visual fields full to confrontation, extraocular muscles --> ON LEFT GAZE, RIGHT HAS INCOMPLETE ADDUCTION WITH LEFT EYE NYSTAGMUS AND SUBJECTIVE DOUBLE VISION; facial sensation symmetric, SUBTLE DECR LEFT NL FOLD; DECR RIGHT EYE BROW RAISE,  hearing intact, palate elevates symmetrically, uvula midline, shoulder shrug symmetric, tongue midline. MOTOR: INCR TONE IN RIGHT ARM AND RIGHT LEG; RUE 4+; RLE 3 PROX, 4 DISTAL; LUE AND LLE NORMAL SENSORY: DECR IN RIGHT HAND AND RIGHT FOOT TO ALL MODALITIES COORDINATION: RUE ATAXIA; LUE NORMAL REFLEXES: RUE 3+ WITH SPREAD;  LUE 3; RLE (KNEE 3+, ANKLE TRACE); LLE (KNEE 3, ANKLE 1) GAIT/STATION: SPASTIC, HEMIPARETIC GAIT WITH RIGHT LEG WEAKNESS/STIFFNESS; UNSTEADY; USING 4 POINT CANE.    DIAGNOSTIC DATA (LABS, IMAGING, TESTING) - I reviewed patient records, labs, notes, testing and imaging  myself where available.  Lab Results  Component Value Date   WBC 4.7 10/02/2014   HGB 13.4 10/02/2014   HCT 39.7 10/02/2014   MCV 93 10/02/2014   PLT 256 10/02/2014      Component Value Date/Time   NA 140 10/02/2014 1236   NA 138 08/30/2014 1031   K 3.9 10/02/2014 1236   CL 103 10/02/2014 1236   CO2 25 10/02/2014 1236   GLUCOSE 67 10/02/2014 1236   GLUCOSE 91 08/30/2014 1031   BUN 10 10/02/2014 1236   BUN 9 08/30/2014 1031   CREATININE 0.81 10/02/2014 1236   CALCIUM 8.9 10/02/2014 1236   PROT 6.5 10/02/2014 1236   PROT 6.6 08/30/2014 1031   ALBUMIN 3.5 08/30/2014 1031   AST 13 10/02/2014 1236   ALT 5 10/02/2014 1236   ALKPHOS 51 10/02/2014 1236   BILITOT 0.4 10/02/2014 1236   GFRNONAA 94 10/02/2014 1236   GFRAA 108 10/02/2014 1236   No results found for: CHOL No results found for: HGBA1C No results found for: VITAMINB12 No results found for: TSH  VIT D, 25-HYDROXY  Date Value Ref Range Status  10/31/2014 15.1* 30.0 - 100.0 ng/mL Final    Comment:    Vitamin D deficiency has been defined by the Institute of Medicine and an Endocrine Society practice guideline as a level of serum 25-OH vitamin D less than 20 ng/mL (1,2). The Endocrine Society went on to further define vitamin D insufficiency as a level between 21 and 29 ng/mL (2). 1. IOM (Institute of Medicine). 2010. Dietary reference    intakes for calcium and D. [place]: The    [employer]. 2. Holick MF, Binkley [place], Bischoff-Ferrari HA, et al.    Evaluation, treatment, and prevention of vitamin D    deficiency: an Endocrine Society clinical practice    guideline. JCEM. 2011 Jul; 96(7):1911-30.    LYMPHOCYTES ABSOLUTE  Date Value Ref Range Status  10/02/2014 1.7 0.7 - 3.1 x10E3/uL Final   LYMPHS ABS  Date Value Ref Range Status  08/30/2014 1.5 0.7 - 4.0 K/uL Final    I reviewed images myself and agree with interpretation. -VRP  08/30/14 MRI BRAIN - No acute infarct or acute  intracranial abnormality identified, however, there there is scattered abnormal signal in the brain which could indicate either multifocal prior ischemia or sequelae of demyelinating disease. Does the past medical history suggest the possibility of multiple sclerosis, or conversely does the patient have risk factors for stroke at this young age? Consider also a source of paradoxic emboli (e.g. patent foramen ovale). There is also subtle evidence of abnormal signal in the left dorsal brainstem at the cervicomedullary junction (series 5, image 4, and possibly also continuing into the dorsal cervical spinal cord at C2 (series 5, image 1). These signal changes are also suggested on coronal T2 images, but subtle.   10/24/14 MRI brain (with) 1. Few round and ovoid peri-callosal and subcortical T2 hyperintense plaques, suspicious for demyelinating disease. Other autoimmune, inflammatory, post-infectious or microvascular etiologies are possible. 2. No abnormal enhancing lesions.  10/24/14 MRI cervical spine (with and without)  1. Multiple T2 hyperintense spinal cord lesion from cervico-medullary junction down to C7 level (~8 lesions), more posteriorly in the spinal cord. Findings suspicious for chronic demyelinating disease. 2. No abnormal enhancing lesions.  Labs - ANA, ANCA, ACE, HIV, RPR - all negative  11/01/14 anti-JCV ab - 1.31 (H) positive    ASSESSMENT AND PLAN  39 y.o. year old female here with multiple focal neurologic attacks since 2008. 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 MRI brain and cervical spine suspicious for chronic demyelinating disease. Other labs negative.  PLAN: - home PT - baclofen for muscle spasms - start Vit D and MVI supplements - rollator walker - check labs today - continue tecfidera  Meds ordered this encounter  Medications  . baclofen (LIORESAL) 10 MG tablet    Sig:  Take 1 tablet (10 mg total) by mouth 3 (three) times daily as needed for muscle spasms.    Dispense:  90 tablet    Refill:  6   Orders Placed This Encounter  Procedures  . For home use only DME 4 wheeled rolling walker with seat  . CBC with Differential/Platelet  . Comprehensive metabolic panel  . Ambulatory referral to Home Health   Return in about 3 months (around 04/25/2015).    Suanne Marker, MD 01/25/2015, 11:48 AM Certified in Neurology, Neurophysiology and Neuroimaging  [facility] [address], [address]  [place], [place] [address] [phone]

## 2015-01-26 LAB — COMPREHENSIVE METABOLIC PANEL
ALBUMIN: 4.1 g/dL (ref 3.5–5.5)
ALK PHOS: 54 IU/L (ref 39–117)
ALT: 43 IU/L — ABNORMAL HIGH (ref 0–32)
AST: 40 IU/L (ref 0–40)
Albumin/Globulin Ratio: 1.9 (ref 1.1–2.5)
BILIRUBIN TOTAL: 0.4 mg/dL (ref 0.0–1.2)
BUN / CREAT RATIO: 13 (ref 8–20)
BUN: 9 mg/dL (ref 6–20)
CHLORIDE: 101 mmol/L (ref 97–108)
CO2: 25 mmol/L (ref 18–29)
CREATININE: 0.69 mg/dL (ref 0.57–1.00)
Calcium: 9 mg/dL (ref 8.7–10.2)
GFR calc Af Amer: 129 mL/min/{1.73_m2} (ref >59)
GFR calc non Af Amer: 112 mL/min/{1.73_m2} (ref >59)
Globulin, Total: 2.2 g/dL (ref 1.5–4.5)
Glucose: 84 mg/dL (ref 65–99)
POTASSIUM: 4.1 mmol/L (ref 3.5–5.2)
Sodium: 139 mmol/L (ref 134–144)
Total Protein: 6.3 g/dL (ref 6.0–8.5)

## 2015-01-26 LAB — CBC WITH DIFFERENTIAL/PLATELET
BASOS: 0 %
Basophils Absolute: 0 10*3/uL (ref 0.0–0.2)
EOS: 1 %
Eosinophils Absolute: 0.1 10*3/uL (ref 0.0–0.4)
HCT: 38.2 % (ref 34.0–46.6)
Hemoglobin: 12.7 g/dL (ref 11.1–15.9)
IMMATURE GRANULOCYTES: 0 %
Immature Grans (Abs): 0 10*3/uL (ref 0.0–0.1)
Lymphocytes Absolute: 1.7 10*3/uL (ref 0.7–3.1)
Lymphs: 40 %
MCH: 31.7 pg (ref 26.6–33.0)
MCHC: 33.2 g/dL (ref 31.5–35.7)
MCV: 95 fL (ref 79–97)
Monocytes Absolute: 0.3 10*3/uL (ref 0.1–0.9)
Monocytes: 8 %
Neutrophils Absolute: 2.1 10*3/uL (ref 1.4–7.0)
Neutrophils Relative %: 51 %
Platelets: 209 10*3/uL (ref 150–379)
RBC: 4.01 x10E6/uL (ref 3.77–5.28)
RDW: 13.8 % (ref 12.3–15.4)
WBC: 4.2 10*3/uL (ref 3.4–10.8)

## 2015-02-01 ENCOUNTER — Ambulatory Visit: Payer: Medicaid Other | Admitting: Diagnostic Neuroimaging

## 2015-02-12 ENCOUNTER — Ambulatory Visit: Payer: Self-pay | Admitting: Diagnostic Neuroimaging

## 2015-02-18 DIAGNOSIS — F411 Generalized anxiety disorder: Secondary | ICD-10-CM | POA: Diagnosis not present

## 2015-03-12 ENCOUNTER — Telehealth: Payer: Self-pay | Admitting: *Deleted

## 2015-03-12 NOTE — Telephone Encounter (Signed)
Spoke with the medicare rep on the phone, she was questioning if the pt was receiving the medication or not. Read through the notes from 01/23/15 in which the pt stated that she was on tecfidera. Rep stated that the pt must be receiving it from the company. She thanked me for my time

## 2015-03-19 DIAGNOSIS — F419 Anxiety disorder, unspecified: Secondary | ICD-10-CM | POA: Diagnosis not present

## 2015-04-11 DIAGNOSIS — F411 Generalized anxiety disorder: Secondary | ICD-10-CM | POA: Diagnosis not present

## 2015-04-26 ENCOUNTER — Ambulatory Visit: Payer: Self-pay | Admitting: Diagnostic Neuroimaging

## 2015-04-26 ENCOUNTER — Telehealth: Payer: Self-pay | Admitting: *Deleted

## 2015-04-26 NOTE — Telephone Encounter (Signed)
Spoke to the pt on the phone and informed her that her appt would be at 9 am and not 915 and that she would need to try and be here for her appt at 845 AM. She stated a thanks and an understanding

## 2015-05-01 ENCOUNTER — Telehealth: Payer: Self-pay | Admitting: *Deleted

## 2015-05-01 ENCOUNTER — Ambulatory Visit (INDEPENDENT_AMBULATORY_CARE_PROVIDER_SITE_OTHER): Payer: Medicare Other | Admitting: Diagnostic Neuroimaging

## 2015-05-01 ENCOUNTER — Encounter: Payer: Self-pay | Admitting: Diagnostic Neuroimaging

## 2015-05-01 VITALS — BP 101/67 | HR 78 | Ht 67.0 in | Wt 207.8 lb

## 2015-05-01 DIAGNOSIS — G35 Multiple sclerosis: Secondary | ICD-10-CM

## 2015-05-01 DIAGNOSIS — E559 Vitamin D deficiency, unspecified: Secondary | ICD-10-CM | POA: Diagnosis not present

## 2015-05-01 DIAGNOSIS — Z79899 Other long term (current) drug therapy: Secondary | ICD-10-CM | POA: Diagnosis not present

## 2015-05-01 NOTE — Telephone Encounter (Signed)
I told the pt that I spoke to [place] at [facility] the referral nurse and that she had informed me that I would need to fill out a form with the pts insurance and submit it to [employer]. I told her that once they processed it, they should be sending someone out to assess her and see what she needed with home health services. I apologized that the referral was not taken care of earlier and she told me that it was ok and she thanked me for the update about the referral today.

## 2015-05-01 NOTE — Patient Instructions (Signed)
Continue tecfidera, baclofen.  I will setup home therapy (physical and occupational).

## 2015-05-01 NOTE — Telephone Encounter (Signed)
Spoke with the referral coordinator at [facility] about the referral that was sent to them in February and was questioning why nothing was done with it. She told me that with medicaid the pt would need to have form from [place] completed by the doctor and then faxed in so the could send an independent contractor out to access the pt and see exactly what she needed and for how much service she needs. She is emailing me the forms and i thanked her for being so helpful

## 2015-05-01 NOTE — Progress Notes (Signed)
[facility]  PATIENT: Ebony Warren DOB: [birth year]  REFERRING CLINICIAN: Williams  HISTORY FROM: patient  REASON FOR VISIT: follow up   HISTORICAL  CHIEF COMPLAINT:  Chief Complaint  Patient presents with  . Follow-up    Multiple Sclerosis     HISTORY OF PRESENT ILLNESS:   UPDATE 01/25/15: Since last visit, had more problems for a few weeks (lower ext weakness, bladder incont), but now improved. Now on tecfidera x 3 weeks. Some itching and stomach issues with tecfidera, but mild. Main issues now consist of lower extremity pain, spasms, urinary leakage, daytime fatigue.  UPDATE 10/31/14: Since last visit, sxs are stable. Test results and diagnosis reviewed. No new events. Using cane to walk.  UPDATE (10/02/14): 39 year old right-handed female here for evaluation of double vision. 2008 patient was 6 months pregnant with her son, when all of a sudden she didn't feel good. She noticed right hand clumsiness and incoordination. This lasted for approximately 9 months and then stopped. She did not seek medical attention for this problem. 2013 patient had onset of right arm weakness, right leg weakness, numbness on the right side, intermittent shaking sensation all over. Patient was evaluated in [place] and diagnosed with possible stroke. 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 Patient did not recover right arm or leg strength. Her speech was also affected. October 2014 patient moved to [facility]. 08/30/2014 patient was at home hanging clothes over the bathtub when she fell down. She is not sure what caused her fall. Her right leg may have given out. Patient fell and struck her head against the soap dish which broke off. Patient did not lose consciousness. Patient's family advised her to go to the emergency room. Patient was evaluated with CT and MRI of the brain. No acute findings were found. MRI of the brain did show multiple white matter lesions suspicious for  chronic multiple sclerosis versus chronic small vessel ischemic disease. Advised to follow-up in outpatient basis. Since this fall patient has noticed some additional blurred vision, and double vision especially when looking to the left side.   REVIEW OF SYSTEMS: Full 14 system review of systems performed and notable only for fatigue hearing loss leg swelling blurred vision double vision cold intolerance flushing incontinent bladder joint pain joint swelling muscle cramps gait difficulty neck stiffness confusion depression anxiety tremor weakness speech difficulty numbness headache dizziness memory loss.   ALLERGIES: Allergies  Allergen Reactions  . Orange Fruit [Citrus]     Makes her itchy and her face "feels like its on fire"  . Percocet [Oxycodone-Acetaminophen]     Stomach cramps   . Vicodin [Hydrocodone-Acetaminophen]     Stomach cramps   . Other Rash    narcotics    HOME MEDICATIONS: Outpatient Prescriptions Prior to Visit  Medication Sig Dispense Refill  . baclofen (LIORESAL) 10 MG tablet Take 1 tablet (10 mg total) by mouth 3 (three) times daily as needed for muscle spasms. 90 tablet 6  . Dimethyl Fumarate 240 MG CPDR Take 240 mg by mouth.    . escitalopram (LEXAPRO) 20 MG tablet Take 20 mg by mouth daily.    Marland Warren LORazepam (ATIVAN) 1 MG tablet Take 1 mg by mouth daily.    . traZODone (DESYREL) 100 MG tablet Take by mouth at bedtime.  0   No facility-administered medications prior to visit.    PAST MEDICAL HISTORY: Past Medical History  Diagnosis Date  . Seizures     told that they are not from the brain  but stress related  . Bradycardia   . Anxiety and depression     PAST SURGICAL HISTORY: Past Surgical History  Procedure Laterality Date  . Cholecystectomy    . Knee surgery      FAMILY HISTORY: Family History  Problem Relation Age of Onset  . Hypertension Mother   . Atrial fibrillation Mother   . Hypertension Father   . Atrial fibrillation Maternal  Grandmother     SOCIAL HISTORY:  History   Social History  . Marital Status: Single    Spouse Name: N/A  . Number of Children: 1  . Years of Education: College   Occupational History  .  Other    disabled   Social History Main Topics  . Smoking status: Former Smoker -- 0.35 packs/day for 3 years    Types: Cigarettes    Quit date: 12/07/2006  . Smokeless tobacco: Not on file  . Alcohol Use: No     Comment: quit: 2013 (socially)  . Drug Use: Yes    Special: Marijuana     Comment: quit: 2001   . Sexual Activity: Not on file   Other Topics Concern  . Not on file   Social History Narrative   Patient lives at home with family.   Caffeine Use: drinks a cup of hot tea with tecfidera in the am   Patient is right handed.   Patient has a college education     PHYSICAL EXAM  Filed Vitals:   05/01/15 0900  BP: 101/67  Pulse: 78  Height: 5\' 7"  (1.702 m)  Weight: 207 lb 12.8 oz (94.257 kg)   No exam data present   Body mass index is 32.54 kg/(m^2).  GENERAL EXAM: Patient is in no distress; well developed, nourished and groomed; neck is supple  CARDIOVASCULAR: Regular rate and rhythm, no murmurs, no carotid bruits  NEUROLOGIC: MENTAL STATUS: awake, alert, language fluent, comprehension intact, naming intact, fund of knowledge appropriate; SLOW SCANNING SPEECH PATTERN CRANIAL NERVE: no papilledema on fundoscopic exam, PUPILS PINPOINT AND REACTIVE; RIGHT EYE MORE SENSITIVE TO LIGHT; visual fields full to confrontation, extraocular muscles --> ON LEFT GAZE, RIGHT HAS INCOMPLETE ADDUCTION WITH LEFT EYE NYSTAGMUS AND SUBJECTIVE DOUBLE VISION; facial sensation symmetric, SUBTLE DECR LEFT NL FOLD; DECR RIGHT EYE BROW RAISE,  hearing intact, palate elevates symmetrically, uvula midline, shoulder shrug symmetric, tongue midline. MOTOR: INCR TONE IN RIGHT ARM AND RIGHT LEG; RUE 4+; RLE 3 PROX, 4 DISTAL; LUE AND LLE NORMAL SENSORY: DECR IN RIGHT HAND AND RIGHT FOOT TO ALL  MODALITIES COORDINATION: RUE ATAXIA; LUE NORMAL REFLEXES: RUE 3+ WITH SPREAD;  LUE 3; RLE (KNEE 3+, ANKLE TRACE); LLE (KNEE 3, ANKLE 1) GAIT/STATION: SPASTIC, HEMIPARETIC GAIT WITH RIGHT LEG WEAKNESS/STIFFNESS; UNSTEADY; USING 4 POINT CANE.    DIAGNOSTIC DATA (LABS, IMAGING, TESTING) - I reviewed patient records, labs, notes, testing and imaging myself where available.  Lab Results  Component Value Date   WBC 4.2 01/25/2015   HGB 12.7 01/25/2015   HCT 38.2 01/25/2015   MCV 95 01/25/2015   PLT 209 01/25/2015      Component Value Date/Time   NA 139 01/25/2015 1238   NA 138 08/30/2014 1031   K 4.1 01/25/2015 1238   CL 101 01/25/2015 1238   CO2 25 01/25/2015 1238   GLUCOSE 84 01/25/2015 1238   GLUCOSE 91 08/30/2014 1031   BUN 9 01/25/2015 1238   BUN 9 08/30/2014 1031   CREATININE 0.69 01/25/2015 1238   CALCIUM 9.0 01/25/2015 1238  PROT 6.3 01/25/2015 1238   PROT 6.6 08/30/2014 1031   ALBUMIN 3.5 08/30/2014 1031   AST 40 01/25/2015 1238   ALT 43* 01/25/2015 1238   ALKPHOS 54 01/25/2015 1238   BILITOT 0.4 01/25/2015 1238   BILITOT 0.4 10/02/2014 1236   GFRNONAA 112 01/25/2015 1238   GFRAA 129 01/25/2015 1238   No results found for: CHOL No results found for: HGBA1C No results found for: VITAMINB12 No results found for: TSH  VIT D, 25-HYDROXY  Date Value Ref Range Status  10/31/2014 15.1* 30.0 - 100.0 ng/mL Final    Comment:    Vitamin D deficiency has been defined by the Institute of Medicine and an Endocrine Society practice guideline as a level of serum 25-OH vitamin D less than 20 ng/mL (1,2). The Endocrine Society went on to further define vitamin D insufficiency as a level between 21 and 29 ng/mL (2). 1. IOM (Institute of Medicine). 2010. Dietary reference    intakes for calcium and D. [place]: The    [employer]. 2. Holick MF, Binkley [place], Bischoff-Ferrari HA, et al.    Evaluation, treatment, and prevention of vitamin D    deficiency: an  Endocrine Society clinical practice    guideline. JCEM. 2011 Jul; 96(7):1911-30.    LYMPHOCYTES ABSOLUTE  Date Value Ref Range Status  01/25/2015 1.7 0.7 - 3.1 x10E3/uL Final  10/02/2014 1.7 0.7 - 3.1 x10E3/uL Final   LYMPHS ABS  Date Value Ref Range Status  08/30/2014 1.5 0.7 - 4.0 K/uL Final    I reviewed images myself and agree with interpretation. -VRP  08/30/14 MRI BRAIN - No acute infarct or acute intracranial abnormality identified, however, there there is scattered abnormal signal in the brain which could indicate either multifocal prior ischemia or sequelae of demyelinating disease. Does the past medical history suggest the possibility of multiple sclerosis, or conversely does the patient have risk factors for stroke at this young age? Consider also a source of paradoxic emboli (e.g. patent foramen ovale). There is also subtle evidence of abnormal signal in the left dorsal brainstem at the cervicomedullary junction (series 5, image 4, and possibly also continuing into the dorsal cervical spinal cord at C2 (series 5, image 1). These signal changes are also suggested on coronal T2 images, but subtle.   10/24/14 MRI brain (with) 1. Few round and ovoid peri-callosal and subcortical T2 hyperintense plaques, suspicious for demyelinating disease. Other autoimmune, inflammatory, post-infectious or microvascular etiologies are possible. 2. No abnormal enhancing lesions.  10/24/14 MRI cervical spine (with and without)  1. Multiple T2 hyperintense spinal cord lesion from cervico-medullary junction down to C7 level (~8 lesions), more posteriorly in the spinal cord. Findings suspicious for chronic demyelinating disease. 2. No abnormal enhancing lesions.  Labs - ANA, ANCA, ACE, HIV, RPR - all negative  11/01/14 anti-JCV ab - 1.31 (H) positive    ASSESSMENT AND PLAN  39 y.o. year old female here with multiple focal neurologic attacks since 2008. 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 MRI brain and cervical spine suspicious for chronic demyelinating disease. Other labs negative.  PLAN: - home PT/OT - continue baclofen for muscle spasms - continue tecfidera - continue MVI supplements - check labs today - repeat MRI in Nov 2016   Orders Placed This Encounter  Procedures  . CBC with Differential/Platelet  . Comprehensive metabolic panel  . Vit D  25 hydroxy (rtn osteoporosis monitoring)  Return in about 6 months (around 11/01/2015).    Suanne Marker, MD 05/01/2015, 9:35 AM Certified in Neurology, Neurophysiology and Neuroimaging  [facility] [place] [facility] [address] [place], [place] [address] [phone]

## 2015-05-02 ENCOUNTER — Encounter: Payer: Self-pay | Admitting: *Deleted

## 2015-05-02 ENCOUNTER — Telehealth: Payer: Self-pay | Admitting: *Deleted

## 2015-05-02 LAB — CBC WITH DIFFERENTIAL/PLATELET
BASOS ABS: 0 10*3/uL (ref 0.0–0.2)
BASOS: 0 %
EOS (ABSOLUTE): 0.1 10*3/uL (ref 0.0–0.4)
Eos: 2 %
HEMOGLOBIN: 12.8 g/dL (ref 11.1–15.9)
Hematocrit: 38.9 % (ref 34.0–46.6)
Immature Grans (Abs): 0 10*3/uL (ref 0.0–0.1)
Immature Granulocytes: 0 %
LYMPHS: 25 %
Lymphocytes Absolute: 1.3 10*3/uL (ref 0.7–3.1)
MCH: 32.1 pg (ref 26.6–33.0)
MCHC: 32.9 g/dL (ref 31.5–35.7)
MCV: 98 fL — AB (ref 79–97)
Monocytes Absolute: 0.4 10*3/uL (ref 0.1–0.9)
Monocytes: 8 %
NEUTROS PCT: 65 %
Neutrophils Absolute: 3.4 10*3/uL (ref 1.4–7.0)
Platelets: 231 10*3/uL (ref 150–379)
RBC: 3.99 x10E6/uL (ref 3.77–5.28)
RDW: 13.3 % (ref 12.3–15.4)
WBC: 5.3 10*3/uL (ref 3.4–10.8)

## 2015-05-02 LAB — COMPREHENSIVE METABOLIC PANEL
ALT: 12 IU/L (ref 0–32)
AST: 15 IU/L (ref 0–40)
Albumin/Globulin Ratio: 1.7 (ref 1.1–2.5)
Albumin: 4.2 g/dL (ref 3.5–5.5)
Alkaline Phosphatase: 44 IU/L (ref 39–117)
BILIRUBIN TOTAL: 0.4 mg/dL (ref 0.0–1.2)
BUN/Creatinine Ratio: 13 (ref 8–20)
BUN: 9 mg/dL (ref 6–20)
CO2: 26 mmol/L (ref 18–29)
Calcium: 8.9 mg/dL (ref 8.7–10.2)
Chloride: 102 mmol/L (ref 97–108)
Creatinine, Ser: 0.72 mg/dL (ref 0.57–1.00)
GFR calc Af Amer: 124 mL/min/{1.73_m2} (ref >59)
GFR calc non Af Amer: 107 mL/min/{1.73_m2} (ref >59)
GLOBULIN, TOTAL: 2.5 g/dL (ref 1.5–4.5)
Glucose: 67 mg/dL (ref 65–99)
POTASSIUM: 3.7 mmol/L (ref 3.5–5.2)
Sodium: 142 mmol/L (ref 134–144)
TOTAL PROTEIN: 6.7 g/dL (ref 6.0–8.5)

## 2015-05-02 LAB — VITAMIN D 25 HYDROXY (VIT D DEFICIENCY, FRACTURES): Vit D, 25-Hydroxy: 25.3 ng/mL — ABNORMAL LOW (ref 30.0–100.0)

## 2015-05-02 NOTE — Telephone Encounter (Signed)
Spoke to the pt on the phone and informed her of her lab results and about her need to take replacement Vit D3 1000 IU daily for 6-12 months. She stated a thanks and an understanding

## 2015-05-16 ENCOUNTER — Other Ambulatory Visit: Payer: Self-pay | Admitting: *Deleted

## 2015-05-16 ENCOUNTER — Telehealth: Payer: Self-pay | Admitting: Diagnostic Neuroimaging

## 2015-05-16 DIAGNOSIS — G35 Multiple sclerosis: Secondary | ICD-10-CM

## 2015-05-16 NOTE — Telephone Encounter (Signed)
Called and spoke with the pt, talked about the referrals to PT/OT and home health. She told me that home health contacted her and that she had still not heard anything about PT/OT. I asked if she would like to come to the neuro rehab next door and she said yes. I told her to give the rehab place until Wednesday of next week and to call me back if she had not heard anything and I would give her their number to call. She thanked me   I talked with Denese Killings the referral coordinator who informed me the referral from 10/2014 was expired and asked that I place the order again.   I placed the order for the pt

## 2015-05-16 NOTE — Telephone Encounter (Signed)
Patient called stating she was returning Samantha's call. Please call and advise. Patient can be reached at [phone]

## 2015-05-24 ENCOUNTER — Ambulatory Visit: Payer: Self-pay | Admitting: Certified Nurse Midwife

## 2015-07-01 ENCOUNTER — Ambulatory Visit: Payer: Medicare Other | Admitting: Physical Therapy

## 2015-07-01 ENCOUNTER — Encounter: Payer: Self-pay | Admitting: Physical Therapy

## 2015-07-01 ENCOUNTER — Ambulatory Visit: Payer: Medicare Other | Attending: Diagnostic Neuroimaging | Admitting: Occupational Therapy

## 2015-07-01 DIAGNOSIS — R2689 Other abnormalities of gait and mobility: Secondary | ICD-10-CM

## 2015-07-01 DIAGNOSIS — R29818 Other symptoms and signs involving the nervous system: Secondary | ICD-10-CM | POA: Diagnosis not present

## 2015-07-01 DIAGNOSIS — M6281 Muscle weakness (generalized): Secondary | ICD-10-CM | POA: Insufficient documentation

## 2015-07-01 DIAGNOSIS — M25641 Stiffness of right hand, not elsewhere classified: Secondary | ICD-10-CM | POA: Insufficient documentation

## 2015-07-01 DIAGNOSIS — R4189 Other symptoms and signs involving cognitive functions and awareness: Secondary | ICD-10-CM | POA: Insufficient documentation

## 2015-07-01 DIAGNOSIS — R252 Cramp and spasm: Secondary | ICD-10-CM

## 2015-07-01 DIAGNOSIS — R531 Weakness: Secondary | ICD-10-CM | POA: Insufficient documentation

## 2015-07-01 DIAGNOSIS — R279 Unspecified lack of coordination: Secondary | ICD-10-CM | POA: Insufficient documentation

## 2015-07-01 DIAGNOSIS — H539 Unspecified visual disturbance: Secondary | ICD-10-CM | POA: Insufficient documentation

## 2015-07-01 DIAGNOSIS — R258 Other abnormal involuntary movements: Secondary | ICD-10-CM | POA: Diagnosis not present

## 2015-07-01 DIAGNOSIS — R2681 Unsteadiness on feet: Secondary | ICD-10-CM

## 2015-07-01 DIAGNOSIS — R269 Unspecified abnormalities of gait and mobility: Secondary | ICD-10-CM | POA: Insufficient documentation

## 2015-07-01 DIAGNOSIS — M25569 Pain in unspecified knee: Secondary | ICD-10-CM | POA: Diagnosis not present

## 2015-07-01 DIAGNOSIS — R6889 Other general symptoms and signs: Secondary | ICD-10-CM | POA: Insufficient documentation

## 2015-07-01 NOTE — Therapy (Signed)
[facility] [address] [place], [place], [address] Phone: [phone]   Fax:  [phone]  Physical Therapy Evaluation  Patient Details  Name: Ebony Warren MRN: [id number] Date of Birth: [birth year] Referring Provider:  Suanne Marker, MD  Encounter Date: 07/01/2015      PT End of Session - 07/01/15 1230    Visit Number 1   Number of Visits 18   Date for PT Re-Evaluation 08/30/15   Authorization Type G-Code    PT Start Time 1230   PT Stop Time 1314   PT Time Calculation (min) 44 min   Equipment Utilized During Treatment Gait belt   Activity Tolerance Patient tolerated treatment well   Behavior During Therapy [facility] for tasks assessed/performed      Past Medical History  Diagnosis Date  . Seizures     told that they are not from the brain but stress related  . Bradycardia   . Anxiety and depression     Past Surgical History  Procedure Laterality Date  . Cholecystectomy    . Knee surgery      There were no vitals filed for this visit.  Visit Diagnosis:  Abnormality of gait  Unsteadiness  Balance problems  Generalized weakness  Pain in joint, lower leg, unspecified laterality      Subjective Assessment - 07/01/15 1241    Subjective This 38yo was referred to PT & OT for weakness & mobility issues with MS. She was diagnosed November 2015 with onset of seizures. She just got Medicare to assist with coverage of needed services.   Patient Stated Goals To run again, walk without walker and hopefully no device, improve balance   Currently in Pain? Yes   Pain Score 3   in last week worst 10/10, best 3/10   Pain Location Leg   Pain Orientation Right;Left  thigh distally bilaterally   Pain Descriptors / Indicators Aching;Other (Comment)  cold   Pain Type Chronic pain   Pain Onset More than a month ago   Pain Frequency Constant   Aggravating Factors  alot of movement, sit too long, swelling,  cold   Pain Relieving Factors if walking- sitting,  if sitting, getting up to walk, lying down with deep breathing   Multiple Pain Sites Yes   Pain Score 6  in last week worst 10/10, best 4/10   Pain Location Hand   Pain Orientation Right   Pain Descriptors / Indicators Aching;Throbbing;Numbness   Pain Type Chronic pain   Pain Onset More than a month ago   Pain Frequency Constant   Aggravating Factors  unknown   Pain Relieving Factors holding it still, medications            [facility] PT Assessment - 07/01/15 1230    Assessment   Medical Diagnosis Multiple Sclerosis   Onset Date/Surgical Date --  Nov. 2015 diagnosed, relapsing   Precautions   Precautions Fall   Restrictions   Weight Bearing Restrictions No   Balance Screen   Has the patient fallen in the past 6 months Yes   How many times? >2x/wk, twice yesterday (outside)   Has the patient had a decrease in activity level because of a fear of falling?  Yes   Is the patient reluctant to leave their home because of a fear of falling?  No   Home Tourist information centre manager residence   Research officer, trade union;Children  58yo mother with back issues, 7yo son  Type of Home House   Home Access Stairs to enter   Entrance Stairs-Number of Steps 4   Entrance Stairs-Rails None   Home Layout One level   Home Equipment Walker - 2 wheels;Cane - single point;Cane - quad;Tub bench;Hand held shower head;Wheelchair - Cytogeneticist   Prior Function   Level of Independence Independent with household mobility with device;Independent with community mobility with device;Independent with gait;Independent with transfers  used [facility] for ~6 months prior to diagnosis up to 4 miles   Vocation On disability   Coordination   Gross Motor Movements are Fluid and Coordinated No   Coordination and Movement Description Right LE slow to intiate & perform hip, knee & ankle movements   Posture/Postural Control   Posture/Postural Control  Postural limitations   Postural Limitations Rounded Shoulders;Forward head;Increased lumbar lordosis;Flexed trunk  wide base   ROM / Strength   AROM / PROM / Strength Strength   AROM   Overall AROM  Within functional limits for tasks performed   Strength   Right Hip Flexion 2+/5   Right Hip Extension 3-/5   Right Hip ABduction 3-/5   Left Hip Flexion 4/5   Left Hip Extension 4/5   Left Hip ABduction 4/5   Right/Left Knee Left;Right   Right Knee Flexion 3+/5   Right Knee Extension 4-/5   Left Knee Flexion 4/5   Left Knee Extension 4+/5   Right/Left Ankle Right;Left   Right Ankle Dorsiflexion 4-/5   Left Ankle Dorsiflexion 4/5   Transfers   Transfers Sit to Stand;Stand to Sit   Sit to Stand 5: Supervision;With upper extremity assist;With armrests;From chair/3-in-1  with touching walker to stabilize   Stand to Sit 5: Supervision;With upper extremity assist;With armrests;To chair/3-in-1   Ambulation/Gait   Ambulation/Gait Yes   Ambulation/Gait Assistance 5: Supervision;4: Min assist  SBA with walker or SBQC, MinA without deivce   Ambulation Distance (Feet) 150 Feet  150 with rollator, 25' with SBQC & no device   Assistive device Rollator;Small based quad cane;None   Gait Pattern Step-through pattern;Decreased step length - left;Decreased step length - right;Decreased stride length;Decreased stance time - right;Decreased hip/knee flexion - right;Right circumduction;Trunk flexed;Wide base of support;Poor foot clearance - right   Ambulation Surface Indoor;Level   Gait velocity 1.43ft/sec  rollator   Standardized Balance Assessment   Four Square Step Test Trial One   Berg Balance Test   Sit to Stand Able to stand  independently using hands   Standing Unsupported Able to stand safely 2 minutes   Sitting with Back Unsupported but Feet Supported on Floor or Stool Able to sit safely and securely 2 minutes   Stand to Sit Controls descent by using hands   Transfers Able to transfer  safely, minor use of hands   Standing Unsupported with Eyes Closed Able to stand 10 seconds with supervision   Standing Ubsupported with Feet Together Needs help to attain position but able to stand for 30 seconds with feet together   From Standing, Reach Forward with Outstretched Arm Reaches forward but needs supervision   From Standing Position, Pick up Object from Floor Unable to pick up and needs supervision   From Standing Position, Turn to Look Behind Over each Shoulder Needs supervision when turning   Turn 360 Degrees Needs close supervision or verbal cueing   Standing Unsupported, Alternately Place Feet on Step/Stool Able to complete >2 steps/needs minimal assist   Standing Unsupported, One Foot in Front Needs help to step but  can hold 15 seconds   Standing on One Leg Unable to try or needs assist to prevent fall   Total Score 28   Timed Up and Go Test   TUG Normal TUG;Cognitive TUG   Normal TUG (seconds) 17.18  17.18sec w/ rollator, 14.69sec w/ SBQC, 10.97sec no device    Cognitive TUG (seconds) 25.03  no device 128% increase   Four Square Step Test    Trial One  42.17  SBQC                              PT Short Term Goals - 07/01/15 1230    PT SHORT TERM GOAL #1   Title Verbalizes understanding of fall prevention strategies in home (Target Date: 07/31/2015)   Time 1   Period Months   Status New   PT SHORT TERM GOAL #2   Title demonstrates / verbalizes understanding of initial HEP (Target Date: 07/31/2015)   Time 1   Period Months   Status New   PT SHORT TERM GOAL #3   Title patient reports pain improved 25% (Target Date: 07/31/2015)   Time 1   Period Months   Status New   PT SHORT TERM GOAL #4   Title Timed Up & Go with SBQC <13 seconds. (Target Date: 07/31/2015)   Time 1   Period Months   Status New           PT Long Term Goals - 07/01/15 1230    PT LONG TERM GOAL #1   Title Patient demonstrates / verbalizes appropriate ongoing fitness  plan / HEP. (Target Date: 08/30/2015)   Time 2   Period Months   Status New   PT LONG TERM GOAL #2   Title Patient verbalizes understanding of MS percautions with MS. . (Target Date: 08/30/2015)   Time 2   Period Months   Status New   PT LONG TERM GOAL #3   Title Increase Berg Balance >/= 45/56 . (Target Date: 08/30/2015)   Time 2   Period Months   Status New   PT LONG TERM GOAL #4   Title Timed Up & Go without device cognitive increases <50% from std. TUG . (Target Date: 08/30/2015)   Time 2   Period Months   Status New   PT LONG TERM GOAL #5   Title Four Square with SBQC <30sec safely. . (Target Date: 08/30/2015)   Time 2   Period Months   Status New   PT LONG TERM GOAL #6   Title Lower Extremity pain improved 50% from initial evaluation. . (Target Date: 08/30/2015)   Time 2   Period Months   Status New               Plan - 07/01/15 1230    Clinical Impression Statement This 38yo has had decline in mobility over last year with MS. She is unknowledgeable in MS and function like increased falls with outside activities in summer heat. Patient test high risk for fall with standing activities noted by Sharlene Motts  and with gait noted by TUG, 4-square and gait abnormalities   Pt will benefit from skilled therapeutic intervention in order to improve on the following deficits Abnormal gait;Decreased activity tolerance;Decreased balance;Decreased endurance;Decreased mobility;Decreased safety awareness;Decreased strength;Pain;Postural dysfunction   Rehab Potential Good   PT Frequency 2x / week   PT Duration --  9 weeks (60 days)   PT Treatment/Interventions ADLs/Self Care Home  Management;DME Instruction;Gait training;Stair training;Functional mobility training;Therapeutic activities;Therapeutic exercise;Balance training;Neuromuscular re-education;Patient/family education   PT Next Visit Plan HEP for strength & balance, MS instruction   Consulted and Agree with Plan of Care Patient           G-Codes - 07-03-15 1230    Functional Assessment Tool Used Berg Balance 28/56   Functional Limitation Mobility: Walking and moving around   Mobility: Walking and Moving Around Current Status [phone]) At least 60 percent but less than 80 percent impaired, limited or restricted   Mobility: Walking and Moving Around Goal Status [phone]) At least 20 percent but less than 40 percent impaired, limited or restricted       Problem List Patient Active Problem List   Diagnosis Date Noted  . Multiple sclerosis 10/31/2014  . Hemiplegia, unspecified, affecting dominant side 08/30/2014    Vladimir Faster PT, DPT 2015-07-03, 10:31 PM  [facility] [address] [place], [place], [address] Phone: [phone]   Fax:  [phone]
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
## 2015-07-03 DIAGNOSIS — F411 Generalized anxiety disorder: Secondary | ICD-10-CM | POA: Diagnosis not present

## 2015-07-05 ENCOUNTER — Telehealth: Payer: Self-pay | Admitting: Diagnostic Neuroimaging

## 2015-07-05 ENCOUNTER — Ambulatory Visit: Payer: Medicare Other

## 2015-07-05 DIAGNOSIS — R2681 Unsteadiness on feet: Secondary | ICD-10-CM

## 2015-07-05 DIAGNOSIS — M6281 Muscle weakness (generalized): Secondary | ICD-10-CM | POA: Diagnosis not present

## 2015-07-05 DIAGNOSIS — R269 Unspecified abnormalities of gait and mobility: Secondary | ICD-10-CM

## 2015-07-05 NOTE — Patient Instructions (Addendum)
Energy Conservation Techniques  1. Sit for as many activities as possible. 2. Use slow, smooth movements.  Rushing increases discomfort. 3. Determine the necessity of performing the task.  Simplify those tasks that are necessary.  (Get clothes out of the dryer when they are warm instead of ironing, let dishes air dry, etc.) 4. Take frequent rests both during and between activities.  Avoid repetitive tasks. 5. Pre-plan your activities; try a daily and/or weekly schedule.  Spread out the activities that are most fatiguing (break up cleaning tasks over multiple days). 6. Remember to plan a balance of work, rest and recreation. 7. Consider the best time for each activity.  Do the most exertive task when you have the most energy. 8. Don't carry items if you can push them.  Slide, don't lift. Push, don't pull. 9. Utilize two hands when appropriate. 10. Maintain good posture and use proper body mechanics.  Avoid remaining in one position for too long.  When lifting, bend at the knees, not at the waist.  Exhale when bending down, inhale when straightening up.  Carry objects as close to your body and as near to the center of the pelvis.  11. Avoid wasted body movements (position yourself for the task so that you avoid bending, twisting, etc.                when possible). 12. Select the best working environment.  Consider lighting, ventilation, clothing, and equipment. 13. Organize your storage areas, making the items you use daily convenient.  Store heaviest items at waist            height.  Store frequently used items between shoulders and knee height.  Consider leaving frequently used       items on countertops.  (You can organize in storage baskets based on time used/purpose). 14. Feelings and emotions can be real causes of fatigue.  Try to avoid unnecessary worry, irritation, or                    frustration.  Avoid stress, it can also be a source of fatigue. 15. Get help from other people for  difficult tasks. 16. Explore equipment or items that may be able to do the job for you with greater ease.  (Electric can        openers, blenders, lightweight items for cleaning, etc.)  PERFORM AT KITCHEN SINK WITH A STURDY CHAIR BEHIND YOU FOR SAFETY.  Feet Apart, Head Motion - Eyes Open   With eyes open, feet apart, move head slowly: up and down 10 times and side to side 10 times. Repeat _1___ times per session. Do __2__ sessions per day.  Copyright  VHI. All rights reserved.  Feet Apart, Varied Arm Positions - Eyes Closed   Stand with feet shoulder width apart and arms at your side. Close eyes and visualize upright position. Hold __30__ seconds. Repeat __1__ times per session. Do __2__ sessions per day.  Copyright  VHI. All rights reserved.  Feet Together, Varied Arm Positions - Eyes Open   With eyes open, feet together, arms at your side, look straight ahead at a stationary object. Hold __30__ seconds. Repeat __1__ times per session. Do __2__ sessions per day.  Copyright  VHI. All rights reserved.  Weight Shift: Anterior / Posterior (Limits of Stability)   Slowly shift weight backward until toes begin to rise off floor. Return to starting position. Shift weight slowly forward until heels begin to rise off floor.  Hold each position __2__ seconds. Repeat _10___ times per session. Do __2__ sessions per day.  USE 1-2 HANDS ON COUNTER FOR SUPPORT AND PROGRESS TO NO HANDS. Copyright  VHI. All rights reserved.

## 2015-07-05 NOTE — Telephone Encounter (Signed)
Requested papers completed and successfully faxed to [facility], [facility].

## 2015-07-05 NOTE — Therapy (Signed)
[facility] [address] [place], [place], [address] Phone: [phone]   Fax:  [phone]  Physical Therapy Treatment  Patient Details  Name: Ebony Warren MRN: [id number] Date of Birth: May 01, 1976 Referring Provider:  Suanne Marker, MD  Encounter Date: 07/05/2015      PT End of Session - 07/05/15 1449    Visit Number 2   Number of Visits 18   Date for PT Re-Evaluation 08/30/15   Authorization Type G-Code    PT Start Time 1400   PT Stop Time 1443   PT Time Calculation (min) 43 min   Equipment Utilized During Treatment Gait belt   Activity Tolerance Patient tolerated treatment well   Behavior During Therapy [facility] for tasks assessed/performed      Past Medical History  Diagnosis Date  . Seizures     told that they are not from the brain but stress related  . Bradycardia   . Anxiety and depression     Past Surgical History  Procedure Laterality Date  . Cholecystectomy    . Knee surgery      There were no vitals filed for this visit.  Visit Diagnosis:  Abnormality of gait  Unsteadiness      Subjective Assessment - 07/05/15 1402    Subjective Pt reported two falls since last visit, when playing with her 34 month old puppy. The puppy's kennel will be delivered for outdoors tomorrow. Pt reported she took muscle relaxer 2 hours ago to reduce pain prior to PT. pt denied having a seizure after last visit, but stated she was in a lot of pain. Pt reported she would like to get the most out of therapy but is concerned that 2x/week is too much, as she has other MD and OT appointments.    Patient Stated Goals To run again, walk without walker and hopefully no device, improve balance   Currently in Pain? Yes   Pain Score 2    Pain Location Hand   Pain Orientation Right;Left   Pain Descriptors / Indicators Tingling   Pain Type Chronic pain   Pain Onset More than a month ago   Pain Frequency Constant   Aggravating Factors  heat   Pain Relieving Factors medication   Multiple Pain Sites Yes   Pain Score 3   Pain Location Leg   Pain Orientation Right;Left   Pain Descriptors / Indicators Aching   Pain Type Chronic pain   Pain Onset More than a month ago   Pain Frequency Constant   Aggravating Factors  walking   Pain Relieving Factors rest        Neuro re-ed: Pt performed balance HEP at counter with 0-2 UE support. Pt performed 3 sets/activity with 10-30 second holds or reps of 10 for head turns. VC's, demonstration and tactile cues for technique. Pt required frequent seated rest breaks 2/2 fatigue. Please see pt instructions for details.                         PT Education - 07/05/15 1448    Education provided Yes   Education Details Energy conservation technique handout/education, balance HEP. PT discussed the importance of maintaining 2x/week PT frequency in order to maximize functional gains. Pt still wishes to reduce frequency due to other appointments, PT will notify primary PT.   Person(s) Educated Patient   Methods Explanation;Handout;Verbal cues;Demonstration   Comprehension Verbalized understanding;Returned demonstration  PT Short Term Goals - 07/05/15 1451    PT SHORT TERM GOAL #1   Title Verbalizes understanding of fall prevention strategies in home (Target Date: 07/31/2015)   Time 1   Period Months   Status On-going   PT SHORT TERM GOAL #2   Title demonstrates / verbalizes understanding of initial HEP (Target Date: 07/31/2015)   Time 1   Period Months   Status On-going   PT SHORT TERM GOAL #3   Title patient reports pain improved 25% (Target Date: 07/31/2015)   Time 1   Period Months   Status On-going   PT SHORT TERM GOAL #4   Title Timed Up & Go with SBQC <13 seconds. (Target Date: 07/31/2015)   Time 1   Period Months   Status On-going           PT Long Term Goals - 07/05/15 1451    PT LONG TERM GOAL #1   Title Patient  demonstrates / verbalizes appropriate ongoing fitness plan / HEP. (Target Date: 08/30/2015)   Time 2   Period Months   Status On-going   PT LONG TERM GOAL #2   Title Patient verbalizes understanding of MS percautions with MS. . (Target Date: 08/30/2015)   Time 2   Period Months   Status On-going   PT LONG TERM GOAL #3   Title Increase Berg Balance >/= 45/56 . (Target Date: 08/30/2015)   Time 2   Period Months   Status On-going   PT LONG TERM GOAL #4   Title Timed Up & Go without device cognitive increases <50% from std. TUG . (Target Date: 08/30/2015)   Time 2   Period Months   Status On-going   PT LONG TERM GOAL #5   Title [place] with SBQC <30sec safely. . (Target Date: 08/30/2015)   Time 2   Period Months   Status On-going   PT LONG TERM GOAL #6   Title Lower Extremity pain improved 50% from initial evaluation. . (Target Date: 08/30/2015)   Time 2   Period Months   Status On-going               Plan - 07/05/15 1449    Clinical Impression Statement Pt required frequent seated rest breaks during balance HEP 2/2 fatigue and tingling in B LEs. Pt experienced increased postural sway during standing balance activities with eyes close and during head turns and required occasional min A to maintain balance. Pt would continue to benefit from skilled PT to improve safety during functional mobility.    Pt will benefit from skilled therapeutic intervention in order to improve on the following deficits Abnormal gait;Decreased activity tolerance;Decreased balance;Decreased endurance;Decreased mobility;Decreased safety awareness;Decreased strength;Pain;Postural dysfunction   Rehab Potential Good   PT Frequency 2x / week   PT Duration Other (comment)  9 weeks (60 days)   PT Treatment/Interventions ADLs/Self Care Home Management;DME Instruction;Gait training;Stair training;Functional mobility training;Therapeutic activities;Therapeutic exercise;Balance training;Neuromuscular  re-education;Patient/family education   PT Next Visit Plan Initiate HEP for strength. Provide fall prevention handout.   PT Home Exercise Plan Balance HEP   Consulted and Agree with Plan of Care Patient        Problem List Patient Active Problem List   Diagnosis Date Noted  . Multiple sclerosis 10/31/2014  . Hemiplegia, unspecified, affecting dominant side 08/30/2014    Miller,Jennifer L 07/05/2015, 2:53 PM  [facility] [address] [place], [place], [address] Phone: [phone]   Fax:  [phone]     Zerita Boers, PT,DPT 07/05/2015 2:53 PM Phone: [phone] Fax: [phone]

## 2015-07-05 NOTE — Telephone Encounter (Signed)
Ebony Warren with [facility] called inquiring if POC has been received? She states it was faxed on 06/03/15. Please call and advise. She can be reached at [phone] and fax # is [phone].

## 2015-07-09 ENCOUNTER — Encounter: Payer: Medicare Other | Admitting: Occupational Therapy

## 2015-07-11 ENCOUNTER — Encounter: Payer: Self-pay | Admitting: Physical Therapy

## 2015-07-11 ENCOUNTER — Ambulatory Visit: Payer: Medicare Other | Attending: Diagnostic Neuroimaging | Admitting: Physical Therapy

## 2015-07-11 DIAGNOSIS — R531 Weakness: Secondary | ICD-10-CM | POA: Diagnosis not present

## 2015-07-11 DIAGNOSIS — H539 Unspecified visual disturbance: Secondary | ICD-10-CM | POA: Diagnosis not present

## 2015-07-11 DIAGNOSIS — R279 Unspecified lack of coordination: Secondary | ICD-10-CM | POA: Diagnosis not present

## 2015-07-11 DIAGNOSIS — R6889 Other general symptoms and signs: Secondary | ICD-10-CM | POA: Diagnosis not present

## 2015-07-11 DIAGNOSIS — R258 Other abnormal involuntary movements: Secondary | ICD-10-CM | POA: Diagnosis not present

## 2015-07-11 DIAGNOSIS — M25569 Pain in unspecified knee: Secondary | ICD-10-CM | POA: Diagnosis not present

## 2015-07-11 DIAGNOSIS — R29818 Other symptoms and signs involving the nervous system: Secondary | ICD-10-CM | POA: Insufficient documentation

## 2015-07-11 DIAGNOSIS — R2689 Other abnormalities of gait and mobility: Secondary | ICD-10-CM

## 2015-07-11 DIAGNOSIS — R269 Unspecified abnormalities of gait and mobility: Secondary | ICD-10-CM | POA: Diagnosis not present

## 2015-07-11 DIAGNOSIS — R4189 Other symptoms and signs involving cognitive functions and awareness: Secondary | ICD-10-CM | POA: Insufficient documentation

## 2015-07-11 DIAGNOSIS — M25641 Stiffness of right hand, not elsewhere classified: Secondary | ICD-10-CM | POA: Insufficient documentation

## 2015-07-11 DIAGNOSIS — R29898 Other symptoms and signs involving the musculoskeletal system: Secondary | ICD-10-CM | POA: Insufficient documentation

## 2015-07-11 DIAGNOSIS — M6281 Muscle weakness (generalized): Secondary | ICD-10-CM

## 2015-07-11 DIAGNOSIS — R2681 Unsteadiness on feet: Secondary | ICD-10-CM

## 2015-07-11 NOTE — Patient Instructions (Signed)
Multiple Sclerosis Multiple sclerosis (MS) is a disease of the central nervous system. It leads to the loss of the insulating covering of the nerves (myelin sheath) of your brain. When this happens, brain signals do not get sent properly or may not get sent at all. The age of onset of MS varies.  CAUSES The cause of MS is unknown. However, it is more common in the [place] than in the [place]. RISK FACTORS There is a higher number of women with MS than men. MS is not an illness that is passed down to you from your family members (inherited). However, your risk of MS is higher if you have a relative with MS. SIGNS AND SYMPTOMS  The symptoms of MS occur in episodes or attacks. These attacks may last weeks to months. There may be long periods of almost no symptoms between attacks. The symptoms of MS vary. This is because of the many different ways it affects the central nervous system. The main symptoms of MS include:  Vision problems and eye pain.  Numbness.  Weakness.  Inability to move your arms, hands, feet, or legs (paralysis).  Balance problems.  Tremors. DIAGNOSIS  Your health care provider can diagnose MS with the help of imaging exams and lab tests. These may include specialized X-ray exams and spinal fluid tests. The best imaging exam to confirm a diagnosis of MS is an MRI. TREATMENT  There is no known cure for MS, but there are medicines that can decrease the number and frequency of attacks. Steroids are often used for short-term relief. Physical and occupational therapy may also help. There are also many new alternative or complementary treatments available to help control the symptoms of MS. Ask your health care provider if any of these other options are right for you. HOME CARE INSTRUCTIONS   Take medicines as directed by your health care provider.  Exercise as directed by your health care provider. SEEK MEDICAL CARE IF: You begin to feel  depressed. SEEK IMMEDIATE MEDICAL CARE IF:  You develop paralysis.  You have problems with bladder, bowel, or sexual function.  You develop mental changes, such as forgetfulness or mood swings.  You have a period of uncontrolled movements (seizure). Document Released: 11/20/2000 Document Revised: 11/28/2013 Document Reviewed: 07/31/2013  PERFORM AT KITCHEN SINK WITH A STURDY CHAIR BEHIND YOU FOR SAFETY.  Feet Apart, Head Motion - Eyes Open   With eyes open, feet apart, move head slowly: up and down, right & left, up-right & down-left, up-left & down-right 10 times each of 4 directions. Repeat _1___ times per session. Do __1__ sessions every other day.  Copyright  VHI. All rights reserved.  Feet Apart, Varied Arm Positions - Eyes Closed   Stand with feet shoulder width apart and arms at your side. Close eyes and visualize upright position. Hold __30__ seconds. Repeat __1__ times per session. Do __2__ sessions per day.  Copyright  VHI. All rights reserved.  Feet Together, Varied Arm Positions - Eyes Open   With eyes open, feet together, arms at your side, look straight ahead at a stationary object. Hold __30__ seconds. Repeat __1__ times per session. Do __2__ sessions per day.  Copyright  VHI. All rights reserved.  Weight Shift: Anterior / Posterior (Limits of Stability)   Slowly shift weight backward until toes begin to rise off floor. Return to starting position. Shift weight slowly forward until heels begin to rise off floor. Hold each position __2__ seconds. Repeat _10___ times  per session. Do __2__ sessions per day.  USE 1-2 HANDS ON COUNTER FOR SUPPORT AND PROGRESS TO NO HANDS. ExitCare Patient Information 2015 [place], [place]. This information is not intended to replace advice given to you by your health care provider. Make sure you discuss any questions you have with your health care provider. Weight Shift: Lateral (Limits of Stability)   Slowly shift HIPS  (not upper body as picture shows) weight to right just enough to put more weight on right foot. Return to starting position. Shift to opposite side. Hold each position _3___ seconds. Repeat _10___ times per session. Do __1__ sessions every other day.   Copyright  VHI. All rights reserved.    In sitting at edge of chair so back is not supported with table in front of you for safety: 1. Eyes open move your head up and down, right & left, up-right & down-left, up-left & down-right 10 times each of 4 directions. 2. Do this with your eyes closed: move your head  up and down, right & left, up-right & down-left, up-left & down-right 10 times each of 4 directions.

## 2015-07-12 ENCOUNTER — Ambulatory Visit: Payer: Medicare Other | Admitting: Physical Therapy

## 2015-07-12 ENCOUNTER — Encounter: Payer: Self-pay | Admitting: Physical Therapy

## 2015-07-12 DIAGNOSIS — R6889 Other general symptoms and signs: Secondary | ICD-10-CM

## 2015-07-12 DIAGNOSIS — M6281 Muscle weakness (generalized): Secondary | ICD-10-CM

## 2015-07-12 DIAGNOSIS — R269 Unspecified abnormalities of gait and mobility: Secondary | ICD-10-CM | POA: Diagnosis not present

## 2015-07-12 DIAGNOSIS — M25569 Pain in unspecified knee: Secondary | ICD-10-CM

## 2015-07-12 DIAGNOSIS — R2681 Unsteadiness on feet: Secondary | ICD-10-CM

## 2015-07-12 NOTE — Therapy (Signed)
[facility] [address] [place], [place], [address] Phone: [phone]   Fax:  [phone]  Physical Therapy Treatment  Patient Details  Name: Ebony Warren MRN: [id number] Date of Birth: [birth year] Referring Provider:  Suanne Marker, MD  Encounter Date: 07/12/2015     07/12/15 1410  PT Visits / Re-Eval  Visit Number 4  Number of Visits 18  Date for PT Re-Evaluation 08/30/15  Authorization  Authorization Type G-Code   PT Time Calculation  PT Start Time 1403  PT Stop Time 1445  PT Time Calculation (min) 42 min  PT - End of Session  Equipment Utilized During Treatment Gait belt  Activity Tolerance Patient tolerated treatment well  Behavior During Therapy [facility] for tasks assessed/performed    Past Medical History  Diagnosis Date  . Seizures     told that they are not from the brain but stress related  . Bradycardia   . Anxiety and depression     Past Surgical History  Procedure Laterality Date  . Cholecystectomy    . Knee surgery      There were no vitals filed for this visit.  Visit Diagnosis:  Muscle weakness (generalized)  Unsteadiness  Decreased activity tolerance  Pain in joint, lower leg, unspecified laterality     07/12/15 1407  Symptoms/Limitations  Subjective No new complaints. No falls to report.   Pain Assessment  Currently in Pain? Yes  Pain Score 2  Pain Location Hand  Pain Orientation Right  Pain Descriptors / Indicators Sore;Aching;Numbness (changes between numbness and aching)  Pain Type Chronic pain  Pain Onset More than a month ago  Pain Frequency Constant  Aggravating Factors  unknown  Pain Relieving Factors medication- baclofen, naproxren   Treatment: Reviewed current HEP and provided cues on correct technique with supervision on balance activities.  Stretching: Seated hamstring stretch Piriformis stretch Hip flexor stretch 30 sec's each x 2 each bil  sides. Pt very flexible, so advised to do these once a day for maintenance.         PT Education - 07/12/15 1441    Education provided Yes   Education Details HEP: hamstring, piriformis and hip flexor stretches.   Person(s) Educated Patient   Methods Explanation;Demonstration;Handout   Comprehension Verbalized understanding;Returned demonstration          PT Short Term Goals - 07/05/15 1451    PT SHORT TERM GOAL #1   Title Verbalizes understanding of fall prevention strategies in home (Target Date: 07/31/2015)   Time 1   Period Months   Status On-going   PT SHORT TERM GOAL #2   Title demonstrates / verbalizes understanding of initial HEP (Target Date: 07/31/2015)   Time 1   Period Months   Status On-going   PT SHORT TERM GOAL #3   Title patient reports pain improved 25% (Target Date: 07/31/2015)   Time 1   Period Months   Status On-going   PT SHORT TERM GOAL #4   Title Timed Up & Go with SBQC <13 seconds. (Target Date: 07/31/2015)   Time 1   Period Months   Status On-going           PT Long Term Goals - 07/05/15 1451    PT LONG TERM GOAL #1   Title Patient demonstrates / verbalizes appropriate ongoing fitness plan / HEP. (Target Date: 08/30/2015)   Time 2   Period Months   Status On-going   PT LONG TERM GOAL #2  Title Patient verbalizes understanding of MS percautions with MS. . (Target Date: 08/30/2015)   Time 2   Period Months   Status On-going   PT LONG TERM GOAL #3   Title Increase Berg Balance >/= 45/56 . (Target Date: 08/30/2015)   Time 2   Period Months   Status On-going   PT LONG TERM GOAL #4   Title Timed Up & Go without device cognitive increases <50% from std. TUG . (Target Date: 08/30/2015)   Time 2   Period Months   Status On-going   PT LONG TERM GOAL #5   Title Four Square with SBQC <30sec safely. . (Target Date: 08/30/2015)   Time 2   Period Months   Status On-going   PT LONG TERM GOAL #6   Title Lower Extremity pain improved 50% from  initial evaluation. . (Target Date: 08/30/2015)   Time 2   Period Months   Status On-going        07/12/15 1411  Plan  Clinical Impression Statement Educated pt on and issued stretching for HEP. 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 Pt making steady progress toward goals.  Pt will benefit from skilled therapeutic intervention in order to improve on the following deficits Abnormal gait;Decreased activity tolerance;Decreased balance;Decreased endurance;Decreased mobility;Decreased safety awareness;Decreased strength;Pain;Postural dysfunction  Rehab Potential Good  PT Frequency 2x / week  PT Duration Other (comment) (9 weeks (60 days))  PT Treatment/Interventions ADLs/Self Care Home Management;DME Instruction;Gait training;Stair training;Functional mobility training;Therapeutic activities;Therapeutic exercise;Balance training;Neuromuscular re-education;Patient/family education  PT Next Visit Plan review rollator walker safety and assess on barriers  Consulted and Agree with Plan of Care Patient     Problem List Patient Active Problem List   Diagnosis Date Noted  . Multiple sclerosis 10/31/2014  . Hemiplegia, unspecified, affecting dominant side 08/30/2014    Sallyanne Kuster 07/13/2015, 10:12 PM  Sallyanne Kuster, PTA, [facility] [address] [place], [place] [address] [phone] 07/13/2015, 10:12 PM

## 2015-07-12 NOTE — Therapy (Signed)
[facility] [address] [place], [place], [address] Phone: [phone]   Fax:  [phone]  Physical Therapy Treatment  Patient Details  Name: Ebony Warren MRN: [id number] Date of Birth: [birth year] Referring Provider:  Suanne Marker, MD  Encounter Date: 07/11/2015      PT End of Session - 07/11/15 1530    Visit Number 3   Number of Visits 18   Date for PT Re-Evaluation 08/30/15   Authorization Type G-Code    PT Start Time 1530   PT Stop Time 1615   PT Time Calculation (min) 45 min   Equipment Utilized During Treatment Gait belt   Activity Tolerance Patient tolerated treatment well   Behavior During Therapy [facility] for tasks assessed/performed      Past Medical History  Diagnosis Date  . Seizures     told that they are not from the brain but stress related  . Bradycardia   . Anxiety and depression     Past Surgical History  Procedure Laterality Date  . Cholecystectomy    . Knee surgery      There were no vitals filed for this visit.  Visit Diagnosis:  Abnormality of gait  Unsteadiness  Balance problems  Pain in joint, lower leg, unspecified laterality  Muscle weakness (generalized)  Decreased activity tolerance      Subjective Assessment - 07/11/15 1544    Subjective She went to beach on vacation and passed out for ~5 minutes. No injuries. She went to hospital for dehydration but was not admitted. Her puppy is in [employer] & an organization built a pen for back yard.   Currently in Pain? Yes   Pain Score 8    Pain Location Leg   Pain Orientation Left   Pain Descriptors / Indicators Aching   Pain Type Chronic pain   Pain Onset More than a month ago   Pain Frequency Intermittent   Aggravating Factors  stand too long or sit too long   Pain Relieving Factors medication     Self-Care: See patient education & instructions for MS education Gait Training: PT instructed with verbal &  tactile cues on safety of rollator walker use on level surfaces including brake use during gait, sit to/ from stand and turning; patient ambulated 100' X 2 with cues. Neuromuscular Re-Education: See pt education & instructions on balance HEP                            PT Education - 07/11/15 1530    Education provided Yes   Education Details Heat & MS issues, recommend limiting outdoor summer activities or at least using shade, AC in car, etc to reduce effects on her muscles & decrease falls that result, need to make progress in PT & decreased frequency would limit progress, balancing appts with ~1 per day over multiple in one day that would cause fatigue issues, HEP reviewed & added            Person(s) Educated Patient   Methods Explanation;Demonstration;Tactile cues;Verbal cues;Handout   Comprehension Verbalized understanding;Returned demonstration;Verbal cues required;Tactile cues required;Need further instruction          PT Short Term Goals - 07/05/15 1451    PT SHORT TERM GOAL #1   Title Verbalizes understanding of fall prevention strategies in home (Target Date: 07/31/2015)   Time 1   Period Months   Status On-going   PT SHORT TERM  GOAL #2   Title demonstrates / verbalizes understanding of initial HEP (Target Date: 07/31/2015)   Time 1   Period Months   Status On-going   PT SHORT TERM GOAL #3   Title patient reports pain improved 25% (Target Date: 07/31/2015)   Time 1   Period Months   Status On-going   PT SHORT TERM GOAL #4   Title Timed Up & Go with SBQC <13 seconds. (Target Date: 07/31/2015)   Time 1   Period Months   Status On-going           PT Long Term Goals - 07/05/15 1451    PT LONG TERM GOAL #1   Title Patient demonstrates / verbalizes appropriate ongoing fitness plan / HEP. (Target Date: 08/30/2015)   Time 2   Period Months   Status On-going   PT LONG TERM GOAL #2   Title Patient verbalizes understanding of MS percautions with MS.  . (Target Date: 08/30/2015)   Time 2   Period Months   Status On-going   PT LONG TERM GOAL #3   Title Increase Berg Balance >/= 45/56 . (Target Date: 08/30/2015)   Time 2   Period Months   Status On-going   PT LONG TERM GOAL #4   Title Timed Up & Go without device cognitive increases <50% from std. TUG . (Target Date: 08/30/2015)   Time 2   Period Months   Status On-going   PT LONG TERM GOAL #5   Title Four Square with SBQC <30sec safely. . (Target Date: 08/30/2015)   Time 2   Period Months   Status On-going   PT LONG TERM GOAL #6   Title Lower Extremity pain improved 50% from initial evaluation. . (Target Date: 08/30/2015)   Time 2   Period Months   Status On-going               Plan - 07/11/15 1530    Clinical Impression Statement Patient appears to understand heat issues with MS better & why outdoor summer activities are a factor in her recent falls. She appears to understand HEP instructed thus far.    Pt will benefit from skilled therapeutic intervention in order to improve on the following deficits Abnormal gait;Decreased activity tolerance;Decreased balance;Decreased endurance;Decreased mobility;Decreased safety awareness;Decreased strength;Pain;Postural dysfunction   Rehab Potential Good   PT Frequency 2x / week   PT Duration Other (comment)  9 weeks (60 days)   PT Treatment/Interventions ADLs/Self Care Home Management;DME Instruction;Gait training;Stair training;Functional mobility training;Therapeutic activities;Therapeutic exercise;Balance training;Neuromuscular re-education;Patient/family education   PT Next Visit Plan review HEP and add flexibility exercises, review rollator walker safety and assess on barriers   Consulted and Agree with Plan of Care Patient        Problem List Patient Active Problem List   Diagnosis Date Noted  . Multiple sclerosis 10/31/2014  . Hemiplegia, unspecified, affecting dominant side 08/30/2014    Vladimir Faster PT,  DPT 07/12/2015, 1:36 PM  [facility] [address] [place], [place], [address] Phone: [phone]   Fax:  [phone]

## 2015-07-12 NOTE — Patient Instructions (Signed)
Chair Sitting   Sit at edge of seat, spine straight, one leg extended. Put a hand on each thigh and bend forward from the hip, keeping spine straight. Allow hand on extended leg to reach toward toes. Support upper body with other arm. Hold ___ seconds. Repeat ___ times per session. Do ___ sessions per day.  Copyright  VHI. All rights reserved.  Piriformis (Supine)   Cross legs, right on top. Gently pull other knee toward chest until stretch is felt in buttock/hip of top leg. Hold __30__ seconds. Repeat with other leg. Repeat _3___ times.  Do _1-2__ sessions per day.  http://orth.exer.us/676   Copyright  VHI. All rights reserved.  Hip Flexor Stretch   Lying on back near edge of bed, bend one leg, foot flat. Hang other leg over edge, relaxed, thigh resting entirely on bed for 30 seconds. Repeat __3__ times each leg. Do _1___ sessions per day. Advanced Exercise: Bend knee back keeping thigh in contact with bed.  http://gt2.exer.us/347   Copyright  VHI. All rights reserved.

## 2015-07-15 ENCOUNTER — Encounter: Payer: Self-pay | Admitting: Physical Therapy

## 2015-07-15 ENCOUNTER — Ambulatory Visit: Payer: Medicare Other | Admitting: Physical Therapy

## 2015-07-15 DIAGNOSIS — R279 Unspecified lack of coordination: Secondary | ICD-10-CM

## 2015-07-15 DIAGNOSIS — M25569 Pain in unspecified knee: Secondary | ICD-10-CM

## 2015-07-15 DIAGNOSIS — R269 Unspecified abnormalities of gait and mobility: Secondary | ICD-10-CM | POA: Diagnosis not present

## 2015-07-15 DIAGNOSIS — R2689 Other abnormalities of gait and mobility: Secondary | ICD-10-CM

## 2015-07-15 DIAGNOSIS — M6281 Muscle weakness (generalized): Secondary | ICD-10-CM

## 2015-07-15 NOTE — Therapy (Signed)
[facility] [address] [place], [place], [address] Phone: [phone]   Fax:  [phone]  Physical Therapy Treatment  Patient Details  Name: Ebony Warren MRN: [id number] Date of Birth: [birth year] Referring Provider:  Suanne Marker, MD  Encounter Date: 07/15/2015      PT End of Session - 07/15/15 1619    Visit Number 5   Number of Visits 18   PT Start Time 1530   PT Stop Time 1615   PT Time Calculation (min) 45 min   Activity Tolerance Patient tolerated treatment well      Past Medical History  Diagnosis Date  . Seizures     told that they are not from the brain but stress related  . Bradycardia   . Anxiety and depression     Past Surgical History  Procedure Laterality Date  . Cholecystectomy    . Knee surgery      There were no vitals filed for this visit.  Visit Diagnosis:  Muscle weakness (generalized)  Pain in joint, lower leg, unspecified laterality  Balance problems  Lack of coordination      Subjective Assessment - 07/15/15 1537    Subjective No falls to report; no new complaints; forgot her red folder today; she loves stretching ex's ' they feel great'   Currently in Pain? Yes   Pain Score 4    Pain Location Arm   Pain Orientation Right   Pain Descriptors / Indicators Constant   Pain Type Chronic pain   Pain Radiating Towards wrist up her arm to elbow   Pain Onset More than a month ago   Pain Frequency Constant   Multiple Pain Sites Yes      There ex; Reviewed the HEP stretches; for hams, pirformis and hip flexors; Core balance ex's ; seated on BOSU ball/ w/ball on mat;  1) Arms folded;eyes closed ; head mvts; single leg support  2) arms folded leg lifts; 2 x 10 bilateral 3) 5 pound dumbell in both hands ; diagonal floor to ceiling mvt  Standing balance; feet together/tandem head mvt side to side (couldn't maintain balance with eyes closed;  Stepping in and out of  tandem alternate each step  X 10                              PT Education - 07/15/15 1618    Education provided Yes   Education Details emphasis on saftey with amb; reviewed the HEP LE stretches   Person(s) Educated Patient   Methods Demonstration   Comprehension Returned demonstration          PT Short Term Goals - 07/05/15 1451    PT SHORT TERM GOAL #1   Title Verbalizes understanding of fall prevention strategies in home (Target Date: 07/31/2015)   Time 1   Period Months   Status On-going   PT SHORT TERM GOAL #2   Title demonstrates / verbalizes understanding of initial HEP (Target Date: 07/31/2015)   Time 1   Period Months   Status On-going   PT SHORT TERM GOAL #3   Title patient reports pain improved 25% (Target Date: 07/31/2015)   Time 1   Period Months   Status On-going   PT SHORT TERM GOAL #4   Title Timed Up & Go with SBQC <13 seconds. (Target Date: 07/31/2015)   Time 1   Period Months   Status On-going  PT Long Term Goals - 07/05/15 1451    PT LONG TERM GOAL #1   Title Patient demonstrates / verbalizes appropriate ongoing fitness plan / HEP. (Target Date: 08/30/2015)   Time 2   Period Months   Status On-going   PT LONG TERM GOAL #2   Title Patient verbalizes understanding of MS percautions with MS. . (Target Date: 08/30/2015)   Time 2   Period Months   Status On-going   PT LONG TERM GOAL #3   Title Increase Berg Balance >/= 45/56 . (Target Date: 08/30/2015)   Time 2   Period Months   Status On-going   PT LONG TERM GOAL #4   Title Timed Up & Go without device cognitive increases <50% from std. TUG . (Target Date: 08/30/2015)   Time 2   Period Months   Status On-going   PT LONG TERM GOAL #5   Title Four Square with SBQC <30sec safely. . (Target Date: 08/30/2015)   Time 2   Period Months   Status On-going   PT LONG TERM GOAL #6   Title Lower Extremity pain improved 50% from initial evaluation. . (Target Date:  08/30/2015)   Time 2   Period Months   Status On-going               Plan - 07/15/15 1621    Clinical Impression Statement Patient return demonstrated the LE stretches well , with good intensity and understanding; they are helpful for her to address LE discomfort and pain; ROM is [employer]; She tolerated the core strengthening ex's on the bosu ball well and she reported her overall pain at only a 2/10 when we finished ; She has a great attitude;         Problem List Patient Active Problem List   Diagnosis Date Noted  . Multiple sclerosis 10/31/2014  . Hemiplegia, unspecified, affecting dominant side 08/30/2014    Cecilie Kicks  PT, DPT 07/15/2015, 4:28 PM  [facility] [address] [place], [place], [address] Phone: [phone]   Fax:  [phone]

## 2015-07-16 ENCOUNTER — Encounter: Payer: Self-pay | Admitting: Physical Therapy

## 2015-07-16 ENCOUNTER — Ambulatory Visit: Payer: Medicare Other | Admitting: Physical Therapy

## 2015-07-16 DIAGNOSIS — R2689 Other abnormalities of gait and mobility: Secondary | ICD-10-CM

## 2015-07-16 DIAGNOSIS — M25569 Pain in unspecified knee: Secondary | ICD-10-CM

## 2015-07-16 DIAGNOSIS — R269 Unspecified abnormalities of gait and mobility: Secondary | ICD-10-CM | POA: Diagnosis not present

## 2015-07-16 DIAGNOSIS — R2681 Unsteadiness on feet: Secondary | ICD-10-CM

## 2015-07-16 DIAGNOSIS — M6281 Muscle weakness (generalized): Secondary | ICD-10-CM

## 2015-07-16 DIAGNOSIS — R6889 Other general symptoms and signs: Secondary | ICD-10-CM

## 2015-07-16 NOTE — Patient Instructions (Signed)
KNEE: Extension, Long Arc Quads - Sitting   Raise leg until knee is straight. You may need to help right leg the first few times using a belt around forefoot. Focus on thigh muscle lifting leg, hold 2-3 seconds, then lower foot slowly _10-15__ reps per set, __1-2_ sets per day, _7__ days per week  Copyright  VHI. All rights reserved.  HIP: Flexors - Supine   Lie on edge of surface with hips near edge & shoulders in somewhat. Bring knee to chest that is away from the edge, then lower leg beside the edge to floor. Hold _15-20__ seconds. _3-5__ reps per set, __1-2_ sets per day, __7_ days per week Rest lowered foot on stool.  Copyright  VHI. All rights reserved.

## 2015-07-17 ENCOUNTER — Ambulatory Visit: Payer: Medicare Other | Admitting: Physical Therapy

## 2015-07-17 NOTE — Therapy (Signed)
[facility] [address] [place], [place], [address] Phone: [phone]   Fax:  [phone]  Physical Therapy Treatment  Patient Details  Name: Ebony Warren MRN: [id number] Date of Birth: 12/14/1975 Referring Provider:  Suanne Marker, MD  Encounter Date: 07/16/2015      PT End of Session - 07/16/15 1530    Visit Number 6   Number of Visits 18   Date for PT Re-Evaluation 08/30/15   Authorization Type G-Code    PT Start Time 1530   PT Stop Time 1615   PT Time Calculation (min) 45 min   Equipment Utilized During Treatment Gait belt   Activity Tolerance Patient tolerated treatment well   Behavior During Therapy [facility] for tasks assessed/performed      Past Medical History  Diagnosis Date  . Seizures     told that they are not from the brain but stress related  . Bradycardia   . Anxiety and depression     Past Surgical History  Procedure Laterality Date  . Cholecystectomy    . Knee surgery      There were no vitals filed for this visit.  Visit Diagnosis:  Muscle weakness (generalized)  Pain in joint, lower leg, unspecified laterality  Balance problems  Unsteadiness  Decreased activity tolerance      Subjective Assessment - 07/16/15 1537    Subjective She has been working on getting leg up with exercise the last PT showed her.   Currently in Pain? Yes   Pain Score 6    Pain Location Calf   Pain Orientation Left   Pain Descriptors / Indicators Aching   Pain Type Chronic pain   Pain Onset More than a month ago   Pain Frequency Constant   Aggravating Factors  outside in morning     Self-Care: MS education about factors that cause fatigue Therapeutic Exercise:  Knee extension with cues on proper position to support thigh and use of leg lifter to assist motion for first 5 reps, then able to perform 10 reps 2 sets without assistance. Supine hip flexor stretch with instructing proper  technique. PT verbally reviewed other exercises in HEP booklet & patient able to verbalize understanding. Gait Training: Patient ambulated with rollator walker with cues on brake management & posture 200' with SBA.                            PT Education - 07/16/15 1530    Education provided Yes   Education Details updated HEP, gave National MS handout & instructed in section about factors related to fatigue with MS   Person(s) Educated Patient   Methods Explanation;Demonstration;Tactile cues;Verbal cues;Handout   Comprehension Verbalized understanding;Returned demonstration;Verbal cues required;Tactile cues required;Need further instruction          PT Short Term Goals - 07/05/15 1451    PT SHORT TERM GOAL #1   Title Verbalizes understanding of fall prevention strategies in home (Target Date: 07/31/2015)   Time 1   Period Months   Status On-going   PT SHORT TERM GOAL #2   Title demonstrates / verbalizes understanding of initial HEP (Target Date: 07/31/2015)   Time 1   Period Months   Status On-going   PT SHORT TERM GOAL #3   Title patient reports pain improved 25% (Target Date: 07/31/2015)   Time 1   Period Months   Status On-going   PT SHORT TERM GOAL #4  Title Timed Up & Go with SBQC <13 seconds. (Target Date: 07/31/2015)   Time 1   Period Months   Status On-going           PT Long Term Goals - 07/05/15 1451    PT LONG TERM GOAL #1   Title Patient demonstrates / verbalizes appropriate ongoing fitness plan / HEP. (Target Date: 08/30/2015)   Time 2   Period Months   Status On-going   PT LONG TERM GOAL #2   Title Patient verbalizes understanding of MS percautions with MS. . (Target Date: 08/30/2015)   Time 2   Period Months   Status On-going   PT LONG TERM GOAL #3   Title Increase Berg Balance >/= 45/56 . (Target Date: 08/30/2015)   Time 2   Period Months   Status On-going   PT LONG TERM GOAL #4   Title Timed Up & Go without device  cognitive increases <50% from std. TUG . (Target Date: 08/30/2015)   Time 2   Period Months   Status On-going   PT LONG TERM GOAL #5   Title Four Square with SBQC <30sec safely. . (Target Date: 08/30/2015)   Time 2   Period Months   Status On-going   PT LONG TERM GOAL #6   Title Lower Extremity pain improved 50% from initial evaluation. . (Target Date: 08/30/2015)   Time 2   Period Months   Status On-going               Plan - 07/16/15 1530    Clinical Impression Statement Patient appears to have modified activities to avoid outside in heat which has decreased falls. Patient was able to improve RLE knee extension with manual assist for initial 5 reps. Patient appears to be compliant with HEP and is performing 2-3 exercises only at time to avoid fatigue.   Pt will benefit from skilled therapeutic intervention in order to improve on the following deficits Abnormal gait;Decreased activity tolerance;Decreased balance;Decreased endurance;Decreased mobility;Decreased safety awareness;Decreased strength;Pain;Postural dysfunction   Rehab Potential Good   PT Frequency 2x / week   PT Duration Other (comment)  9 weeks   PT Treatment/Interventions ADLs/Self Care Home Management;DME Instruction;Gait training;Stair training;Functional mobility training;Therapeutic activities;Therapeutic exercise;Balance training;Neuromuscular re-education;Patient/family education   PT Next Visit Plan  review rollator walker safety and assess on barriers   Consulted and Agree with Plan of Care Patient        Problem List Patient Active Problem List   Diagnosis Date Noted  . Multiple sclerosis 10/31/2014  . Hemiplegia, unspecified, affecting dominant side 08/30/2014    Vladimir Faster PT, DPT 07/17/2015, 1:54 PM  [facility] [address] [place], [place], [address] Phone: [phone]   Fax:  [phone]

## 2015-07-22 DIAGNOSIS — F411 Generalized anxiety disorder: Secondary | ICD-10-CM | POA: Diagnosis not present

## 2015-07-25 ENCOUNTER — Ambulatory Visit: Payer: Medicare Other | Admitting: Occupational Therapy

## 2015-07-25 ENCOUNTER — Ambulatory Visit: Payer: Medicare Other | Admitting: Physical Therapy

## 2015-07-25 ENCOUNTER — Encounter: Payer: Self-pay | Admitting: Physical Therapy

## 2015-07-25 DIAGNOSIS — R2689 Other abnormalities of gait and mobility: Secondary | ICD-10-CM

## 2015-07-25 DIAGNOSIS — R6889 Other general symptoms and signs: Secondary | ICD-10-CM

## 2015-07-25 DIAGNOSIS — R252 Cramp and spasm: Secondary | ICD-10-CM

## 2015-07-25 DIAGNOSIS — R4189 Other symptoms and signs involving cognitive functions and awareness: Secondary | ICD-10-CM

## 2015-07-25 DIAGNOSIS — R531 Weakness: Secondary | ICD-10-CM

## 2015-07-25 DIAGNOSIS — R269 Unspecified abnormalities of gait and mobility: Secondary | ICD-10-CM

## 2015-07-25 DIAGNOSIS — H539 Unspecified visual disturbance: Secondary | ICD-10-CM

## 2015-07-25 DIAGNOSIS — R279 Unspecified lack of coordination: Secondary | ICD-10-CM

## 2015-07-25 DIAGNOSIS — R2681 Unsteadiness on feet: Secondary | ICD-10-CM

## 2015-07-25 DIAGNOSIS — R29898 Other symptoms and signs involving the musculoskeletal system: Secondary | ICD-10-CM

## 2015-07-25 DIAGNOSIS — M25641 Stiffness of right hand, not elsewhere classified: Secondary | ICD-10-CM
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
## 2015-07-25 NOTE — Therapy (Signed)
[facility] [address] [place], [place], [address] Phone: [phone]   Fax:  [phone]  Physical Therapy Treatment  Patient Details  Name: Ebony Warren MRN: [id number] Date of Birth: 06-Feb-1976 Referring Provider:  Suanne Marker, MD  Encounter Date: 07/25/2015      PT End of Session - 07/25/15 1456    Visit Number 7   Number of Visits 18   Date for PT Re-Evaluation 08/30/15   Authorization Type G-Code    PT Start Time 1448   PT Stop Time 1527   PT Time Calculation (min) 39 min   Equipment Utilized During Treatment Gait belt   Activity Tolerance Patient tolerated treatment well   Behavior During Therapy [facility] for tasks assessed/performed      Past Medical History  Diagnosis Date  . Seizures     told that they are not from the brain but stress related  . Bradycardia   . Anxiety and depression     Past Surgical History  Procedure Laterality Date  . Cholecystectomy    . Knee surgery      There were no vitals filed for this visit.  Visit Diagnosis:  Generalized weakness  Balance problems  Unsteadiness  Decreased activity tolerance  Lack of coordination  Abnormality of gait  Right leg weakness      Subjective Assessment - 07/25/15 1455    Subjective No new complaints. No falls or pain to report. Pt reports she is so excited that she can lift her right leg up with the exercises she is doing.   Currently in Pain? No/denies   Pain Score 0-No pain     Treatment: 230 feet with rollator on indoor level surfaces, supervision with occasional cues to increase base of support and on walker position with gait.  Ramp x 2 with rollator with min assist/cues on technique, use of brakes to slow it down.  6 inch curb x 2 reps with rollator with min assist/cues on technique and sequence. Cues on brake use as well.   Core balance ex's:  seated on BOSU ball with flat portion down - ball raises  overhead x 10 reps - holding ball at shoulder level, upper trunk rotation left<>right x 10 reps each way. - ball toss forward and then progressing to around the world x 2-3 minutes - with red theraband- shoulder horizontal abduction and rows x 10 reps each - alternating marching x 10 reps each leg - long arc quads x 10 reps each leg        PT Short Term Goals - 07/05/15 1451    PT SHORT TERM GOAL #1   Title Verbalizes understanding of fall prevention strategies in home (Target Date: 07/31/2015)   Time 1   Period Months   Status On-going   PT SHORT TERM GOAL #2   Title demonstrates / verbalizes understanding of initial HEP (Target Date: 07/31/2015)   Time 1   Period Months   Status On-going   PT SHORT TERM GOAL #3   Title patient reports pain improved 25% (Target Date: 07/31/2015)   Time 1   Period Months   Status On-going   PT SHORT TERM GOAL #4   Title Timed Up & Go with SBQC <13 seconds. (Target Date: 07/31/2015)   Time 1   Period Months   Status On-going           PT Long Term Goals - 07/05/15 1451    PT LONG TERM GOAL #  1   Title Patient demonstrates / verbalizes appropriate ongoing fitness plan / HEP. (Target Date: 08/30/2015)   Time 2   Period Months   Status On-going   PT LONG TERM GOAL #2   Title Patient verbalizes understanding of MS percautions with MS. . (Target Date: 08/30/2015)   Time 2   Period Months   Status On-going   PT LONG TERM GOAL #3   Title Increase Berg Balance >/= 45/56 . (Target Date: 08/30/2015)   Time 2   Period Months   Status On-going   PT LONG TERM GOAL #4   Title Timed Up & Go without device cognitive increases <50% from std. TUG . (Target Date: 08/30/2015)   Time 2   Period Months   Status On-going   PT LONG TERM GOAL #5   Title Four Square with SBQC <30sec safely. . (Target Date: 08/30/2015)   Time 2   Period Months   Status On-going   PT LONG TERM GOAL #6   Title Lower Extremity pain improved 50% from initial evaluation. .  (Target Date: 08/30/2015)   Time 2   Period Months   Status On-going            Plan - 07/25/15 1456    Clinical Impression Statement Pt appears to use rollator safely on indoor level surfaces, needed cues on ramp/curb for safe use of rollator. Did not go outdoors due to heat today (precaution for MS). Pt tolerated core exercises well with no issues today. Pt making steady progress toward goals.   Pt will benefit from skilled therapeutic intervention in order to improve on the following deficits Abnormal gait;Decreased activity tolerance;Decreased balance;Decreased endurance;Decreased mobility;Decreased safety awareness;Decreased strength;Pain;Postural dysfunction   Rehab Potential Good   PT Frequency 2x / week   PT Duration Other (comment)  9 weeks   PT Treatment/Interventions ADLs/Self Care Home Management;DME Instruction;Gait training;Stair training;Functional mobility training;Therapeutic activities;Therapeutic exercise;Balance training;Neuromuscular re-education;Patient/family education   PT Next Visit Plan Continue to work on rollator safety on barriers and balance/strengthening toward LTG's.   Consulted and Agree with Plan of Care Patient        Problem List Patient Active Problem List   Diagnosis Date Noted  . Multiple sclerosis 10/31/2014  . Hemiplegia, unspecified, affecting dominant side 08/30/2014    Sallyanne Kuster 07/25/2015, 7:36 PM  Sallyanne Kuster, PTA, [facility] [address] [place], [place] [address] [phone] 07/25/2015, 7:36 PM

## 2015-07-25 NOTE — Patient Instructions (Signed)
  Coordination Activities  Perform the following activities for 20 minutes 1-2 times per day with right hand(s).   Rotate ball in fingertips (clockwise and counter-clockwise).  Toss ball between hands.  Toss ball in air and catch with the same hand.  Flip cards 1 at a time as fast as you can.  Pick up coins, buttons, marbles, dried beans/pasta of different sizes and place in container.  Pick up coins and place in container or coin bank.  Pick up coins and stack.  Pick up coins one at a time until you get 5-10 in your hand, then move coins from palm to fingertips to stack one at a time.  Practice writing and/or typing.

## 2015-07-26 ENCOUNTER — Ambulatory Visit: Payer: Medicare Other | Admitting: Physical Therapy

## 2015-07-26 ENCOUNTER — Ambulatory Visit: Payer: Medicare Other | Admitting: Occupational Therapy

## 2015-07-26 DIAGNOSIS — R252 Cramp and spasm: Secondary | ICD-10-CM

## 2015-07-26 DIAGNOSIS — R2689 Other abnormalities of gait and mobility: Secondary | ICD-10-CM

## 2015-07-26 DIAGNOSIS — M25641 Stiffness of right hand, not elsewhere classified: Secondary | ICD-10-CM

## 2015-07-26 DIAGNOSIS — H539 Unspecified visual disturbance: Secondary | ICD-10-CM

## 2015-07-26 DIAGNOSIS — M6281 Muscle weakness (generalized): Secondary | ICD-10-CM

## 2015-07-26 DIAGNOSIS — R279 Unspecified lack of coordination: Secondary | ICD-10-CM

## 2015-07-26 DIAGNOSIS — R2681 Unsteadiness on feet: Secondary | ICD-10-CM

## 2015-07-26 DIAGNOSIS — R269 Unspecified abnormalities of gait and mobility: Secondary | ICD-10-CM | POA: Diagnosis not present

## 2015-07-26 DIAGNOSIS — R531 Weakness: Secondary | ICD-10-CM

## 2015-07-26 DIAGNOSIS — R4189 Other symptoms and signs involving cognitive functions and awareness: Secondary | ICD-10-CM

## 2015-07-26 DIAGNOSIS — R6889 Other general symptoms and signs: Secondary | ICD-10-CM
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
## 2015-07-26 NOTE — Therapy (Signed)
[facility] [address] [place], [place], [address] Phone: [phone]   Fax:  [phone]  Physical Therapy Treatment  Patient Details  Name: Ebony Warren MRN: [id number] Date of Birth: 1976-09-01 Referring Provider:  Suanne Marker, MD  Encounter Date: 07/26/2015      PT End of Session - 07/26/15 1610    Visit Number 8   Number of Visits 18   PT Start Time 1530   PT Stop Time 1615   PT Time Calculation (min) 45 min   Activity Tolerance Patient tolerated treatment well;Patient limited by fatigue      Past Medical History  Diagnosis Date  . Seizures     told that they are not from the brain but stress related  . Bradycardia   . Anxiety and depression     Past Surgical History  Procedure Laterality Date  . Cholecystectomy    . Knee surgery      There were no vitals filed for this visit.  Visit Diagnosis:  Lack of coordination  Decreased activity tolerance  Generalized weakness  Balance problems  Unsteadiness      Subjective Assessment - 07/26/15 1532    Subjective No new compliants ; denies pain; no falls ; I asked if she was too tired since she came in yesterday and she said just a little. We discussed fatigue with MS and need to try to avoid excessive fatigue and rest before she is exhausted when possible.  Also when chosing to do chores and or ex's to do during her best time of the day; she said this was late morning ; also discussed focus of ther ex on core    Currently in Pain? No/denies        Therapeutic exercise  Balance ex's In parallel bars;  Standing feet together   Weight shifting side to side looking over her shoulder;  After rest ; repeated on foam cushion; mulit bouts; Along counter; walking with one hand assist  And SBA/CGA  Tandem walking evenutally w/o looking down at .  Reviewed supine stretches; for LE and added back rotation (supine bring one leg across  other) Prone core ex's   hip extension 2 x 10 Super man; bilateral arm raise 2 x 10  .    Throughout there ex discussed monitoring herself for excessive fatigue and allowing herself adequate rest breaks                        PT Education - 07/26/15 1552    Education provided Yes   Education Details reviewed; HEP ; said she loves the stretchs; return demod balance ex's; discussed intensity and freqency    Person(s) Educated Patient   Methods Explanation   Comprehension Verbalized understanding          PT Short Term Goals - 07/05/15 1451    PT SHORT TERM GOAL #1   Title Verbalizes understanding of fall prevention strategies in home (Target Date: 07/31/2015)   Time 1   Period Months   Status On-going   PT SHORT TERM GOAL #2   Title demonstrates / verbalizes understanding of initial HEP (Target Date: 07/31/2015)   Time 1   Period Months   Status On-going   PT SHORT TERM GOAL #3   Title patient reports pain improved 25% (Target Date: 07/31/2015)   Time 1   Period Months   Status On-going   PT SHORT TERM GOAL #4  Title Timed Up & Go with SBQC <13 seconds. (Target Date: 07/31/2015)   Time 1   Period Months   Status On-going           PT Long Term Goals - 07/05/15 1451    PT LONG TERM GOAL #1   Title Patient demonstrates / verbalizes appropriate ongoing fitness plan / HEP. (Target Date: 08/30/2015)   Time 2   Period Months   Status On-going   PT LONG TERM GOAL #2   Title Patient verbalizes understanding of MS percautions with MS. . (Target Date: 08/30/2015)   Time 2   Period Months   Status On-going   PT LONG TERM GOAL #3   Title Increase Berg Balance >/= 45/56 . (Target Date: 08/30/2015)   Time 2   Period Months   Status On-going   PT LONG TERM GOAL #4   Title Timed Up & Go without device cognitive increases <50% from std. TUG . (Target Date: 08/30/2015)   Time 2   Period Months   Status On-going   PT LONG TERM GOAL #5   Title Four Square  with SBQC <30sec safely. . (Target Date: 08/30/2015)   Time 2   Period Months   Status On-going   PT LONG TERM GOAL #6   Title Lower Extremity pain improved 50% from initial evaluation. . (Target Date: 08/30/2015)   Time 2   Period Months   Status On-going               Plan - 07/26/15 1610    Clinical Impression Statement Pt is a high fall risk with fatigue; needs to use assisted device at all times; she comprehends the MS education well   and demos safe technique with transfers and walking; Continue to progress core and balance ex's        Problem List Patient Active Problem List   Diagnosis Date Noted  . Multiple sclerosis 10/31/2014  . Hemiplegia, unspecified, affecting dominant side 08/30/2014    Vashti Hey D  PT DPT 07/26/2015, 4:16 PM  [facility] [address] [place], [place], [address] Phone: [phone]   Fax:  [phone]

## 2015-08-01 ENCOUNTER — Encounter: Payer: Self-pay | Admitting: Physical Therapy

## 2015-08-01 ENCOUNTER — Ambulatory Visit: Payer: Medicare Other | Admitting: Physical Therapy

## 2015-08-01 ENCOUNTER — Ambulatory Visit: Payer: Medicare Other | Admitting: Occupational Therapy

## 2015-08-01 DIAGNOSIS — M25641 Stiffness of right hand, not elsewhere classified: Secondary | ICD-10-CM

## 2015-08-01 DIAGNOSIS — R269 Unspecified abnormalities of gait and mobility: Secondary | ICD-10-CM

## 2015-08-01 DIAGNOSIS — R279 Unspecified lack of coordination: Secondary | ICD-10-CM

## 2015-08-01 DIAGNOSIS — R531 Weakness: Secondary | ICD-10-CM

## 2015-08-01 DIAGNOSIS — R6889 Other general symptoms and signs: Secondary | ICD-10-CM

## 2015-08-01 DIAGNOSIS — R252 Cramp and spasm: Secondary | ICD-10-CM

## 2015-08-01 DIAGNOSIS — R29898 Other symptoms and signs involving the musculoskeletal system: Secondary | ICD-10-CM

## 2015-08-01 NOTE — Therapy (Signed)
[facility] [address] [place], [place], [address] Phone: [phone]   Fax:  [phone]  Physical Therapy Treatment  Patient Details  Name: Ebony Warren MRN: [id number] Date of Birth: 12/13/1975 Referring Provider:  Penni Bombard, MD  Encounter Date: 08/01/2015      PT End of Session - 08/01/15 1543    Visit Number 9   Number of Visits 18   PT Start Time 1530   PT Stop Time 8099   PT Time Calculation (min) 44 min   Activity Tolerance Patient tolerated treatment well;Patient limited by fatigue      Past Medical History  Diagnosis Date  . Seizures     told that they are not from the brain but stress related  . Bradycardia   . Anxiety and depression     Past Surgical History  Procedure Laterality Date  . Cholecystectomy    . Knee surgery      There were no vitals filed for this visit.  Visit Diagnosis:  Lack of coordination  Generalized weakness  Decreased activity tolerance  Right leg weakness  Abnormality of gait      Subjective Assessment - 08/01/15 1537    Subjective No new complaints. Had a fall yesterday.trying to stop her son from going outside. She tripped over a toy or something and then she "hit hard" on the floor. Has bruises on her left arm and leg. Denies any pain.     Currently in Pain? No/denies   Pain Score 0-No pain     Treatment: Neuro Re-ed/Exercises combined: Seated on green air disc: cues on midline position and for core activation for stability  UE raises with dowel rod x 10 reps Upper trunk rotation x 10 each way Alternating UE raises with 1# hand weights x 10 reps  Seated on large blue pball:min to mod assist for balance and positioning on ball along with intermittent UE support on mat table Bouncing with emphasis on tall posture through out Pelvic rocking laterally and then anterior/posterior directions with emphasis on pelvic mobility and upper trunk  stability Alternating long are quads x 5 reps each leg, increased time and difficulty noted with right leg  Exercises: Lying on mat - piriformis stretching x 30 sec's each leg - hamstring stretch with belt x 30 sec's each leg         PT Short Term Goals - 08/02/15 1013    PT SHORT TERM GOAL #1   Title Verbalizes understanding of fall prevention strategies in home (Target Date: 07/31/2015)   Time 1   Period Months   Status On-going   PT SHORT TERM GOAL #2   Title demonstrates / verbalizes understanding of initial HEP (Target Date: 07/31/2015)   Time 1   Period Months   Status On-going   PT SHORT TERM GOAL #3   Title patient reports pain improved 25% (Target Date: 07/31/2015)   Time 1   Period Months   Status On-going   PT SHORT TERM GOAL #4   Title Timed Up & Go with SBQC <13 seconds. (Target Date: 07/31/2015)   Time 1   Period Months   Status On-going           PT Long Term Goals - 07/05/15 1451    PT LONG TERM GOAL #1   Title Patient demonstrates / verbalizes appropriate ongoing fitness plan / HEP. (Target Date: 08/30/2015)   Time 2   Period Months   Status On-going  PT LONG TERM GOAL #2   Title Patient verbalizes understanding of MS percautions with MS. . (Target Date: 08/30/2015)   Time 2   Period Months   Status On-going   PT LONG TERM GOAL #3   Title Increase Berg Balance >/= 45/56 . (Target Date: 08/30/2015)   Time 2   Period Months   Status On-going   PT LONG TERM GOAL #4   Title Timed Up & Go without device cognitive increases <50% from std. TUG . (Target Date: 08/30/2015)   Time 2   Period Months   Status On-going   PT LONG TERM GOAL #5   Title Four Square with SBQC <30sec safely. . (Target Date: 08/30/2015)   Time 2   Period Months   Status On-going   PT LONG TERM GOAL #6   Title Lower Extremity pain improved 50% from initial evaluation. . (Target Date: 08/30/2015)   Time 2   Period Months   Status On-going           Plan - 08/01/15 1544     Clinical Impression Statement Pt fatigued with exercises today, however no pain was reported. Challeged in balance and strengthening on pball, needed assist to stabilize on ball. Pt making steady progress toward goals.   Pt will benefit from skilled therapeutic intervention in order to improve on the following deficits Abnormal gait;Decreased activity tolerance;Decreased balance;Decreased endurance;Decreased mobility;Decreased safety awareness;Decreased strength;Pain;Postural dysfunction   Rehab Potential Good   PT Frequency 2x / week   PT Duration Other (comment)  9 weeks   PT Treatment/Interventions ADLs/Self Care Home Management;DME Instruction;Gait training;Stair training;Functional mobility training;Therapeutic activities;Therapeutic exercise;Balance training;Neuromuscular re-education;Patient/family education   PT Next Visit Plan Check STG's not met. Provide fall prevention handouts and review with pt.(generated today and printed, not given to pt) Initiate gait with SBQC (possibly in parallel bars or along counter top intitially). Continue with strengthening and balance work.   Consulted and Agree with Plan of Care Patient        Problem List Patient Active Problem List   Diagnosis Date Noted  . Multiple sclerosis 10/31/2014  . Hemiplegia, unspecified, affecting dominant side 08/30/2014    Willow Ora 08/02/2015, 11:48 AM  Willow Ora, PTA, [facility] [place] [address] [place], [place] [address] [phone] 08/02/2015, 11:48 AM
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
## 2015-08-01 NOTE — Patient Instructions (Signed)
1. Grip Strengthening (Resistive Putty)   Squeeze putty using thumb and all fingers. Repeat 10 times. Do 1 sessions per day.   Extension (Assistive Putty)   Roll putty back and forth, being sure to use all fingertips. Repeat 2 times. Do 1 sessions per day.  Then pinch as below.   Palmar Pinch Strengthening (Resistive Putty)   Pinch putty between thumb and each fingertip in turn after rolling out      ** Work on stretching out fingers when flipping cards.  ** Try using foam grips on toothbrush, utensils (red) and pen (tan)  ** Stretch hand without hyperextending big knuckles.  Place hand on table in standing or stretch fingers/wrist with other hand.

## 2015-08-02 ENCOUNTER — Ambulatory Visit: Payer: Medicare Other | Admitting: *Deleted

## 2015-08-02 ENCOUNTER — Ambulatory Visit: Payer: Medicare Other | Admitting: Occupational Therapy

## 2015-08-02 NOTE — Patient Instructions (Signed)

## 2015-08-15 ENCOUNTER — Encounter: Payer: Self-pay | Admitting: Occupational Therapy

## 2015-08-15 ENCOUNTER — Ambulatory Visit: Payer: Medicare Other | Attending: Diagnostic Neuroimaging | Admitting: Occupational Therapy

## 2015-08-15 ENCOUNTER — Encounter: Payer: Self-pay | Admitting: Physical Therapy

## 2015-08-15 ENCOUNTER — Ambulatory Visit: Payer: Medicare Other | Admitting: Physical Therapy

## 2015-08-15 DIAGNOSIS — R2681 Unsteadiness on feet: Secondary | ICD-10-CM

## 2015-08-15 DIAGNOSIS — R279 Unspecified lack of coordination: Secondary | ICD-10-CM

## 2015-08-15 DIAGNOSIS — R29898 Other symptoms and signs involving the musculoskeletal system: Secondary | ICD-10-CM | POA: Insufficient documentation

## 2015-08-15 DIAGNOSIS — M25569 Pain in unspecified knee: Secondary | ICD-10-CM

## 2015-08-15 DIAGNOSIS — M6281 Muscle weakness (generalized): Secondary | ICD-10-CM | POA: Diagnosis not present

## 2015-08-15 DIAGNOSIS — R269 Unspecified abnormalities of gait and mobility: Secondary | ICD-10-CM | POA: Diagnosis not present

## 2015-08-15 DIAGNOSIS — R29818 Other symptoms and signs involving the nervous system: Secondary | ICD-10-CM | POA: Diagnosis not present

## 2015-08-15 DIAGNOSIS — R6889 Other general symptoms and signs: Secondary | ICD-10-CM | POA: Insufficient documentation

## 2015-08-15 DIAGNOSIS — R258 Other abnormal involuntary movements: Secondary | ICD-10-CM | POA: Insufficient documentation

## 2015-08-15 DIAGNOSIS — M25641 Stiffness of right hand, not elsewhere classified: Secondary | ICD-10-CM | POA: Insufficient documentation

## 2015-08-15 DIAGNOSIS — H539 Unspecified visual disturbance: Secondary | ICD-10-CM | POA: Diagnosis not present

## 2015-08-15 DIAGNOSIS — R252 Cramp and spasm: Secondary | ICD-10-CM

## 2015-08-15 DIAGNOSIS — R531 Weakness: Secondary | ICD-10-CM | POA: Insufficient documentation

## 2015-08-15 DIAGNOSIS — R2689 Other abnormalities of gait and mobility: Secondary | ICD-10-CM

## 2015-08-15 DIAGNOSIS — R4189 Other symptoms and signs involving cognitive functions and awareness: Secondary | ICD-10-CM | POA: Diagnosis not present
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
## 2015-08-16 ENCOUNTER — Ambulatory Visit: Payer: Medicare Other

## 2015-08-16 ENCOUNTER — Ambulatory Visit: Payer: Medicare Other | Admitting: Occupational Therapy

## 2015-08-16 DIAGNOSIS — R2689 Other abnormalities of gait and mobility: Secondary | ICD-10-CM

## 2015-08-16 DIAGNOSIS — R269 Unspecified abnormalities of gait and mobility: Secondary | ICD-10-CM

## 2015-08-16 DIAGNOSIS — R252 Cramp and spasm: Secondary | ICD-10-CM

## 2015-08-16 DIAGNOSIS — R6889 Other general symptoms and signs: Secondary | ICD-10-CM

## 2015-08-16 DIAGNOSIS — R279 Unspecified lack of coordination: Secondary | ICD-10-CM | POA: Diagnosis not present

## 2015-08-16 DIAGNOSIS — R531 Weakness: Secondary | ICD-10-CM

## 2015-08-16 DIAGNOSIS — M25641 Stiffness of right hand, not elsewhere classified: Secondary | ICD-10-CM

## 2015-08-16 NOTE — Patient Instructions (Signed)

## 2015-08-16 NOTE — Therapy (Signed)
[facility] [address] [place], [place], [address] Phone: [phone]   Fax:  [phone]  Physical Therapy Treatment  Patient Details  Name: Ebony Warren MRN: [id number] Date of Birth: 1976-12-07 Referring Provider:  Suanne Marker, MD  Encounter Date: 08/16/2015      PT End of Session - 08/16/15 1615    Visit Number 11   Number of Visits 18   Date for PT Re-Evaluation 08/30/15   Authorization Type G-Code    PT Start Time 1532   PT Stop Time 1612   PT Time Calculation (min) 40 min   Equipment Utilized During Treatment Gait belt   Activity Tolerance Patient tolerated treatment well   Behavior During Therapy [facility] for tasks assessed/performed      Past Medical History  Diagnosis Date  . Seizures     told that they are not from the brain but stress related  . Bradycardia   . Anxiety and depression     Past Surgical History  Procedure Laterality Date  . Cholecystectomy    . Knee surgery      There were no vitals filed for this visit.  Visit Diagnosis:  Abnormality of gait  Balance problems      Subjective Assessment - 08/16/15 1535    Subjective Pt denied falls or changes since last visit.   Patient Stated Goals To run again, walk without walker and hopefully no device, improve balance   Currently in Pain? No/denies                      [facility] Adult PT Treatment/Exercise - 08/16/15 1549    Ambulation/Gait   Ambulation/Gait Yes   Ambulation/Gait Assistance 4: Min guard;4: Min assist   Ambulation/Gait Assistance Details Pt amb. with R foot up brace donned and doffed. Pt ambulated while performing head turns. Cues for sequencing with SPC and to reduce intermittent adduction of R LE during amb. Pt required min A during 3 LOB episodes due to poor R toe clearance without foot up brace donned. Pt required seated rest breaks after each bout of amb. 2/2 fatigue.   Ambulation Distance  (Feet) --  115'x2, 230', 230' and 115' with foot up brace   Assistive device Straight cane   Gait Pattern Step-through pattern;Decreased step length - left;Decreased step length - right;Decreased stride length;Decreased stance time - right;Decreased hip/knee flexion - right;Right circumduction;Trunk flexed;Poor foot clearance - right  occasional R LE adduction   Ambulation Surface Level;Indoor                PT Education - 08/16/15 1615    Education provided Yes   Education Details Fall prevention handout.   Person(s) Educated Patient   Methods Explanation   Comprehension Verbalized understanding          PT Short Term Goals - 08/15/15 1500    PT SHORT TERM GOAL #1   Title Verbalizes understanding of fall prevention strategies in home (Target Date: 07/31/2015)   Time 1   Period Months   Status On-going   PT SHORT TERM GOAL #2   Title demonstrates / verbalizes understanding of initial HEP (Target Date: 07/31/2015)   Baseline MET 08/15/2015 with exercises instructed thus far   Time 1   Period Months   Status Achieved   PT SHORT TERM GOAL #3   Title patient reports pain improved 25% (Target Date: 07/31/2015)   Baseline MET 08/15/2015   Time 1  Period Months   Status Achieved   PT SHORT TERM GOAL #4   Title Timed Up & Go with SBQC <13 seconds. (Target Date: 07/31/2015)   Baseline Partially MET TUG with rollator 15.31sec and cane with minA 13.78sec   Time 1   Period Months   Status Partially Met           PT Long Term Goals - 07/05/15 1451    PT LONG TERM GOAL #1   Title Patient demonstrates / verbalizes appropriate ongoing fitness plan / HEP. (Target Date: 08/30/2015)   Time 2   Period Months   Status On-going   PT LONG TERM GOAL #2   Title Patient verbalizes understanding of MS percautions with MS. . (Target Date: 08/30/2015)   Time 2   Period Months   Status On-going   PT LONG TERM GOAL #3   Title Increase Berg Balance >/= 45/56 . (Target Date: 08/30/2015)    Time 2   Period Months   Status On-going   PT LONG TERM GOAL #4   Title Timed Up & Go without device cognitive increases <50% from std. TUG . (Target Date: 08/30/2015)   Time 2   Period Months   Status On-going   PT LONG TERM GOAL #5   Title [place] with SBQC <30sec safely. . (Target Date: 08/30/2015)   Time 2   Period Months   Status On-going   PT LONG TERM GOAL #6   Title Lower Extremity pain improved 50% from initial evaluation. . (Target Date: 08/30/2015)   Time 2   Period Months   Status On-going               Plan - 08/16/15 1615    Clinical Impression Statement Pt demonstrated progress as she was able to amb. with SPC today. Pt demonstrated improved R toe clearance with R foot up brace donned. Pt did require min A during 3 LOB episodes 2/2 poor R toe clearance without brace donned. PT will discuss use of brace/AFO with pt's primary PT. Continue with POC.   Pt will benefit from skilled therapeutic intervention in order to improve on the following deficits Abnormal gait;Decreased activity tolerance;Decreased balance;Decreased endurance;Decreased mobility;Decreased safety awareness;Decreased strength;Pain;Postural dysfunction   Rehab Potential Good   PT Frequency 2x / week   PT Duration Other (comment)  9 weeks   PT Treatment/Interventions ADLs/Self Care Home Management;DME Instruction;Gait training;Stair training;Functional mobility training;Therapeutic activities;Therapeutic exercise;Balance training;Neuromuscular re-education;Patient/family education   PT Next Visit Plan Gait with SPC, continue strengthening and balance activities.   PT Home Exercise Plan Balance HEP   Consulted and Agree with Plan of Care Patient      Problem List Patient Active Problem List   Diagnosis Date Noted  . Multiple sclerosis 10/31/2014  . Hemiplegia, unspecified, affecting dominant side 08/30/2014    Chelle Cayton L 08/16/2015, 4:18 PM  [facility] [address] [place], [place], [address] Phone: [phone]   Fax:  [phone]     Geoffry Paradise, PT,DPT 08/16/2015 4:18 PM Phone: [phone] Fax: [phone]
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
## 2015-08-16 NOTE — Patient Instructions (Signed)

## 2015-08-16 NOTE — Therapy (Addendum)
[facility] [address] [place], [place], [address] Phone: [phone]   Fax:  [phone]  Physical Therapy Treatment  Patient Details  Name: Ebony Warren MRN: [id number] Date of Birth: 05-Feb-1976 Referring Provider:  Penni Bombard, MD  Encounter Date: 08/15/2015      PT End of Session - 08/15/15 1500    Visit Number 10   Number of Visits 18   Date for PT Re-Evaluation 08/30/15   Authorization Type G-Code    PT Start Time 1500   PT Stop Time 1530   PT Time Calculation (min) 30 min   Equipment Utilized During Treatment Gait belt   Activity Tolerance Patient tolerated treatment well;Patient limited by fatigue   Behavior During Therapy [facility] for tasks assessed/performed      Past Medical History  Diagnosis Date  . Seizures     told that they are not from the brain but stress related  . Bradycardia   . Anxiety and depression     Past Surgical History  Procedure Laterality Date  . Cholecystectomy    . Knee surgery      There were no vitals filed for this visit.  Visit Diagnosis:  Generalized weakness  Decreased activity tolerance  Right leg weakness  Abnormality of gait  Balance problems  Unsteadiness  Pain in joint, lower leg, unspecified laterality      Subjective Assessment - 08/15/15 1504    Subjective (p) No falls. She was laying hay/ straw for dog. She took breaks sitting in shade.    Currently in Pain? (p) Yes   Pain Score (p) 3    Pain Location (p) Calf   Pain Orientation (p) Left   Pain Descriptors / Indicators (p) Cramping   Pain Type (p) Acute pain   Pain Onset (p) Today   Aggravating Factors  (p) from moving straw today   Pain Relieving Factors (p) rest            [facility] PT Assessment - 08/15/15 1500    Berg Balance Test   Sit to Stand Able to stand  independently using hands   Standing Unsupported Able to stand safely 2 minutes   Sitting with Back Unsupported  but Feet Supported on Floor or Stool Able to sit safely and securely 2 minutes   Stand to Sit Sits safely with minimal use of hands   Transfers Able to transfer safely, minor use of hands   Standing Unsupported with Eyes Closed Able to stand 10 seconds safely   Standing Ubsupported with Feet Together Able to place feet together independently but unable to hold for 30 seconds   From Standing, Reach Forward with Outstretched Arm Can reach forward >12 cm safely (5")   From Standing Position, Pick up Object from Floor Able to pick up shoe, needs supervision   From Standing Position, Turn to Look Behind Over each Shoulder Turn sideways only but maintains balance   Turn 360 Degrees Needs close supervision or verbal cueing   Standing Unsupported, Alternately Place Feet on Step/Stool Able to complete >2 steps/needs minimal assist   Standing Unsupported, One Foot in Front Needs help to step but can hold 15 seconds   Standing on One Leg Unable to try or needs assist to prevent fall   Total Score 36   Timed Up and Go Test   Normal TUG (seconds) 15.31  15.31sec with rollator, 13.78sec with cane  [facility] Adult PT Treatment/Exercise - 08/15/15 1500    Transfers   Transfers Sit to Stand;Stand to Sit   Sit to Stand 5: Supervision;With upper extremity assist;From chair/3-in-1  chairs without armrests   Sit to Stand Details (indicate cue type and reason) cues on technique for chairs without armrests   Stand to Sit 5: Supervision;With upper extremity assist;To chair/3-in-1  chairs without armrests   Stand to Sit Details cues on technique for chairs without armrests   Ambulation/Gait   Ambulation/Gait Yes   Ambulation/Gait Assistance 5: Supervision;4: Min guard  supervision with rollator, min guard with single point cane   Ambulation/Gait Assistance Details verbal cues on sequence & step length with cane; SBQC vs single point cane with different tips   Ambulation Distance  (Feet) 250 Feet  250' with rollator, 100' X 2 with single point cane quad tip   Assistive device Straight cane;Rolling walker  straight cane with qued tip   Ambulation Surface Indoor;Level   Stairs Yes   Stairs Assistance 5: Supervision   Stairs Assistance Details (indicate cue type and reason) cues on cane & 1 rail technique   Stair Management Technique One rail Right;With cane;Step to pattern;Forwards   Number of Stairs 4   Ramp 4: Min assist  cane with quad tip   Ramp Details (indicate cue type and reason) cues on technique / posture, wt shift   Curb 4: Min assist  cane with quad tip   Curb Details (indicate cue type and reason) cues on technique with cane   Standardized Balance Assessment   Standardized Balance Assessment Berg Balance Test;Timed Up and Go Test                  PT Short Term Goals - 08/15/15 1500    PT SHORT TERM GOAL #1   Title Verbalizes understanding of fall prevention strategies in home (Target Date: 07/31/2015)   Time 1   Period Months   Status On-going   PT SHORT TERM GOAL #2   Title demonstrates / verbalizes understanding of initial HEP (Target Date: 07/31/2015)   Baseline MET 08/15/2015 with exercises instructed thus far   Time 1   Period Months   Status Achieved   PT SHORT TERM GOAL #3   Title patient reports pain improved 25% (Target Date: 07/31/2015)   Baseline MET 08/15/2015   Time 1   Period Months   Status Achieved   PT SHORT TERM GOAL #4   Title Timed Up & Go with SBQC <13 seconds. (Target Date: 07/31/2015)   Baseline Partially MET TUG with rollator 15.31sec and cane with minA 13.78sec   Time 1   Period Months   Status Partially Met           PT Long Term Goals - 07/05/15 1451    PT LONG TERM GOAL #1   Title Patient demonstrates / verbalizes appropriate ongoing fitness plan / HEP. (Target Date: 08/30/2015)   Time 2   Period Months   Status On-going   PT LONG TERM GOAL #2   Title Patient verbalizes understanding of MS  percautions with MS. . (Target Date: 08/30/2015)   Time 2   Period Months   Status On-going   PT LONG TERM GOAL #3   Title Increase Berg Balance >/= 45/56 . (Target Date: 08/30/2015)   Time 2   Period Months   Status On-going   PT LONG TERM GOAL #4   Title Timed Up & Go without device cognitive increases <50%  from std. TUG . (Target Date: 08/30/2015)   Time 2   Period Months   Status On-going   PT LONG TERM GOAL #5   Title [place] with SBQC <30sec safely. . (Target Date: 08/30/2015)   Time 2   Period Months   Status On-going   PT LONG TERM GOAL #6   Title Lower Extremity pain improved 50% from initial evaluation. . (Target Date: 08/30/2015)   Time 2   Period Months   Status On-going               Plan - 07-Sep-2015 1400    Clinical Impression Statement Patient appears to be incorporate patient eduation into her daily routine which is reducing falls. Patient improved gait with cane with quad tip with instruction & repetition. Patient met STGs.    Pt will benefit from skilled therapeutic intervention in order to improve on the following deficits Abnormal gait;Decreased activity tolerance;Decreased balance;Decreased endurance;Decreased mobility;Decreased safety awareness;Decreased strength;Pain;Postural dysfunction   Rehab Potential Good   PT Frequency 2x / week   PT Duration Other (comment)  9 weeks   PT Treatment/Interventions ADLs/Self Care Home Management;DME Instruction;Gait training;Stair training;Functional mobility training;Therapeutic activities;Therapeutic exercise;Balance training;Neuromuscular re-education;Patient/family education   PT Next Visit Plan Provide fall prevention handouts and review with pt.(generated today and printed, not given to pt) Initiate gait with SPC. Continue with strengthening and balance work.   Consulted and Agree with Plan of Care Patient          G-Codes - 2015/09/07 1500    Functional Assessment Tool Used Berg Balance 36/56    Functional Limitation Mobility: Walking and moving around   Mobility: Walking and Moving Around Current Status [phone]) At least 40 percent but less than 60 percent impaired, limited or restricted   Mobility: Walking and Moving Around Goal Status [phone]) At least 20 percent but less than 40 percent impaired, limited or restricted      Problem List Patient Active Problem List   Diagnosis Date Noted  . Multiple sclerosis 10/31/2014  . Hemiplegia, unspecified, affecting dominant side 08/30/2014    Physical Therapy Progress Note  Dates of Reporting Period: 07/01/2015 to 07-Sep-2015  Objective Reports of Subjective Statement: Patient reports less falls. She is using PT instructions to modify activities with her MS especially paying attention to energy conservation & avoiding outside in heat when able.  Objective Measurements: Patient improved her Berg Balance from 28/56 to 36/56 indicating lower fall risk  Goal Update:      PT Short Term Goals - Sep 07, 2015 1500    PT SHORT TERM GOAL #1   Title Verbalizes understanding of fall prevention strategies in home (Target Date: 07/31/2015)   Time 1   Period Months   Status On-going   PT SHORT TERM GOAL #2   Title demonstrates / verbalizes understanding of initial HEP (Target Date: 07/31/2015)   Baseline MET 2015/09/07 with exercises instructed thus far   Time 1   Period Months   Status Achieved   PT SHORT TERM GOAL #3   Title patient reports pain improved 25% (Target Date: 07/31/2015)   Baseline MET 2015/09/07   Time 1   Period Months   Status Achieved   PT SHORT TERM GOAL #4   Title Timed Up & Go with SBQC <13 seconds. (Target Date: 07/31/2015)   Baseline Partially MET TUG with rollator 15.31sec and cane with minA 13.78sec   Time 1   Period Months   Status Partially Met  Plan: continue with above plan  Reason Skilled Services are Required: Patient still has limitations with weakness & balance deficits limiting her mobility and placing  her at risk of falls.   Jamey Reas PT, DPT 08/16/2015, 1:11 PM  [facility] [place] [address] [place], [place], [address] Phone: [phone]   Fax:  [phone]

## 2015-08-20 DIAGNOSIS — F411 Generalized anxiety disorder: Secondary | ICD-10-CM | POA: Diagnosis not present

## 2015-08-22 ENCOUNTER — Encounter: Payer: Self-pay | Admitting: Physical Therapy

## 2015-08-22 ENCOUNTER — Ambulatory Visit: Payer: Medicare Other | Admitting: Physical Therapy

## 2015-08-22 ENCOUNTER — Ambulatory Visit: Payer: Medicare Other | Admitting: Occupational Therapy

## 2015-08-22 DIAGNOSIS — R279 Unspecified lack of coordination: Secondary | ICD-10-CM

## 2015-08-22 DIAGNOSIS — R531 Weakness: Secondary | ICD-10-CM

## 2015-08-22 DIAGNOSIS — R2689 Other abnormalities of gait and mobility: Secondary | ICD-10-CM

## 2015-08-22 DIAGNOSIS — R6889 Other general symptoms and signs: Secondary | ICD-10-CM

## 2015-08-22 DIAGNOSIS — R29898 Other symptoms and signs involving the musculoskeletal system: Secondary | ICD-10-CM

## 2015-08-22 NOTE — Patient Instructions (Signed)
Plank to Downward Dog (Floor)   From table, step to plank. Hold steady, keep base of skull rising, arms and abdominals strong, and pelvic tilt engaged. Hold for __3-5__ breaths. Reach hips up and back into downward dog. Push through hands, lengthen spine. May bend knees to keep spine straight.  Repeat __5__ times, 2 sessions per day  Cat Back   On hands and knees, exhale and round back up. Inhale and arch back down. Hold each position _3-5__ seconds. Repeat _5__ times per session. Do _2__ sessions per day.  Then rest, try to lift one leg up at a time as able (not fully), but keep position in arms and do not let trunk rotate  TORSO-STRENGTHENING: Roll-Up (Bent Knee)   Sitting up tall, recline 1/2 way with chair behind you. Then push back to sitting uprigh.  Repeat __5-10_ times. Do _2__ times per day.  Copyright  VHI. All rights reserved.

## 2015-08-22 NOTE — Therapy (Signed)
[facility] [address] [place], [place], [address] Phone: [phone]   Fax:  [phone]  Physical Therapy Treatment  Patient Details  Name: Ebony Warren MRN: [id number] Date of Birth: Jan 28, 1976 Referring Provider:  Penni Bombard, MD  Encounter Date: 08/22/2015   08/22/15 1507  PT Visits / Re-Eval  Visit Number 12  Number of Visits 18  Date for PT Re-Evaluation 08/30/15  Authorization  Authorization Type G-Code   PT Time Calculation  PT Start Time 1500 (pt late for session)  PT Stop Time 1530  PT Time Calculation (min) 30 min  PT - End of Session  Equipment Utilized During Treatment Gait belt  Activity Tolerance Patient tolerated treatment well  Behavior During Therapy [facility] for tasks assessed/performed     Past Medical History  Diagnosis Date  . Seizures     told that they are not from the brain but stress related  . Bradycardia   . Anxiety and depression     Past Surgical History  Procedure Laterality Date  . Cholecystectomy    . Knee surgery      There were no vitals filed for this visit.  Visit Diagnosis:   .  Generalized weakness    Decreased activity tolerance     Balance problems     Lack of coordination   .  Right leg weakness         Subjective Assessment - 08/22/15 1504    Subjective Reports her legs are hurting today, from knees down. No falls. Also has been feeling tired and run down all week. Has also started taking Allegra for allergies at night, possible cause of her increased fatigue.   Currently in Pain? Yes   Pain Score 5    Pain Location Leg  knee's down   Pain Orientation Right;Left   Pain Descriptors / Indicators Heaviness;Aching;Constant   Pain Type Acute pain   Pain Onset In the past 7 days   Pain Frequency Intermittent   Aggravating Factors  unknown   Pain Relieving Factors medicine, rest     Treatment Exercises:  Seated on green air disc:  cues on midline, posture and abdominal activation with all exercises Pelvic rocks fwd/bwd and laterally x 10 each way Alternating long arc quads x 10 each side Alternating marching x 10 each side With dowel rod:  - UE raises x 10 reps - upper trunk rotation left<>right x 10 each way With 1# weighted ball - ball toss x 1-2 minutes from straight to around the world paths - UE raises x 10 reps With yellow thera band - hamstring curls x 10 each leg, decreased range on right with less resistance vs left.         PT Short Term Goals - 08/15/15 1500    PT SHORT TERM GOAL #1   Title Verbalizes understanding of fall prevention strategies in home (Target Date: 07/31/2015)   Time 1   Period Months   Status On-going   PT SHORT TERM GOAL #2   Title demonstrates / verbalizes understanding of initial HEP (Target Date: 07/31/2015)   Baseline MET 08/15/2015 with exercises instructed thus far   Time 1   Period Months   Status Achieved   PT SHORT TERM GOAL #3   Title patient reports pain improved 25% (Target Date: 07/31/2015)   Baseline MET 08/15/2015   Time 1   Period Months   Status Achieved   PT SHORT TERM GOAL #4   Title  Timed Up & Go with SBQC <13 seconds. (Target Date: 07/31/2015)   Baseline Partially MET TUG with rollator 15.31sec and cane with minA 13.78sec   Time 1   Period Months   Status Partially Met           PT Long Term Goals - 07/05/15 1451    PT LONG TERM GOAL #1   Title Patient demonstrates / verbalizes appropriate ongoing fitness plan / HEP. (Target Date: 08/30/2015)   Time 2   Period Months   Status On-going   PT LONG TERM GOAL #2   Title Patient verbalizes understanding of MS percautions with MS. . (Target Date: 08/30/2015)   Time 2   Period Months   Status On-going   PT LONG TERM GOAL #3   Title Increase Berg Balance >/= 45/56 . (Target Date: 08/30/2015)   Time 2   Period Months   Status On-going   PT LONG TERM GOAL #4   Title Timed Up & Go without device  cognitive increases <50% from std. TUG . (Target Date: 08/30/2015)   Time 2   Period Months   Status On-going   PT LONG TERM GOAL #5   Title [place] with SBQC <30sec safely. . (Target Date: 08/30/2015)   Time 2   Period Months   Status On-going   PT LONG TERM GOAL #6   Title Lower Extremity pain improved 50% from initial evaluation. . (Target Date: 08/30/2015)   Time 2   Period Months   Status On-going        08/22/15 1508  Plan  Clinical Impression Statement Focused on seated core and leg strengthening due to pt already feeling weak today on arrival. Pt making steady progress toward goals.  Pt will benefit from skilled therapeutic intervention in order to improve on the following deficits Abnormal gait;Decreased activity tolerance;Decreased balance;Decreased endurance;Decreased mobility;Decreased safety awareness;Decreased strength;Pain;Postural dysfunction  Rehab Potential Good  PT Frequency 2x / week  PT Duration Other (comment) (9 weeks)  PT Treatment/Interventions ADLs/Self Care Home Management;DME Instruction;Gait training;Stair training;Functional mobility training;Therapeutic activities;Therapeutic exercise;Balance training;Neuromuscular re-education;Patient/family education  PT Next Visit Plan Gait with SPC, continue strengthening and balance activities.  PT Home Exercise Plan Balance HEP  Consulted and Agree with Plan of Care Patient      Problem List Patient Active Problem List   Diagnosis Date Noted  . Multiple sclerosis 10/31/2014  . Hemiplegia, unspecified, affecting dominant side 08/30/2014    Willow Ora 08/22/2015, 3:11 PM  Willow Ora, PTA, [facility] [address] [place], [place] [address] [phone] 08/22/2015, 3:29 PM
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
## 2015-08-23 ENCOUNTER — Ambulatory Visit: Payer: Medicare Other | Admitting: Occupational Therapy

## 2015-08-23 ENCOUNTER — Ambulatory Visit: Payer: Medicare Other | Admitting: Physical Therapy

## 2015-08-23 ENCOUNTER — Encounter: Payer: Self-pay | Admitting: Physical Therapy

## 2015-08-23 DIAGNOSIS — R279 Unspecified lack of coordination: Secondary | ICD-10-CM | POA: Diagnosis not present

## 2015-08-23 DIAGNOSIS — R6889 Other general symptoms and signs: Secondary | ICD-10-CM

## 2015-08-23 DIAGNOSIS — R531 Weakness: Secondary | ICD-10-CM

## 2015-08-23 DIAGNOSIS — R269 Unspecified abnormalities of gait and mobility: Secondary | ICD-10-CM

## 2015-08-23 DIAGNOSIS — R29898 Other symptoms and signs involving the musculoskeletal system: Secondary | ICD-10-CM
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
## 2015-08-27 ENCOUNTER — Ambulatory Visit: Payer: Medicare Other | Admitting: Occupational Therapy

## 2015-08-27 DIAGNOSIS — R4189 Other symptoms and signs involving cognitive functions and awareness: Secondary | ICD-10-CM

## 2015-08-27 DIAGNOSIS — R531 Weakness: Secondary | ICD-10-CM

## 2015-08-27 DIAGNOSIS — R279 Unspecified lack of coordination: Secondary | ICD-10-CM

## 2015-08-27 NOTE — Patient Instructions (Signed)
Memory Compensation Strategies  1. Use "WARM" strategy.  W= write it down  A= associate it  R= repeat it  M= make a mental note  2.   You can keep a Glass blower/designer.  Use a 3-ring notebook with sections for the following: calendar, important names and phone numbers,  medications, doctors' names/phone numbers, lists/reminders, and a section to journal what you did  each day.   3.    Use a calendar to write appointments down.  4.    Write yourself a schedule for the day.  This can be placed on the calendar or in a separate section of the Memory Notebook.  Keeping a  regular schedule can help memory.  5.    Use medication organizer with sections for each day or morning/evening pills.  You may need help loading it  6.    Keep a basket, or pegboard by the door.  Place items that you need to take out with you in the basket or on the pegboard.  You may also want to  include a message board for reminders.  7.    Use sticky notes.  Place sticky notes with reminders in a place where the task is performed.  For example: " turn off the  stove" placed by the stove, "lock the door" placed on the door at eye level, " take your medications" on  the bathroom mirror or by the place where you normally take your medications.  8.    Use alarms/timers.  Use while cooking to remind yourself to check on food or as a reminder to take your medicine, or as a  reminder to make a call, or as a reminder to perform another task, etc.  9.  Use recorder on your phone for reminders.
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
## 2015-08-28 ENCOUNTER — Ambulatory Visit: Payer: Medicare Other | Admitting: Physical Therapy

## 2015-08-29 ENCOUNTER — Encounter: Payer: Medicare Other | Admitting: Occupational Therapy

## 2015-08-29 ENCOUNTER — Ambulatory Visit: Payer: Medicare Other | Admitting: Physical Therapy

## 2015-08-29 ENCOUNTER — Ambulatory Visit: Payer: Medicare Other | Admitting: Occupational Therapy

## 2015-08-29 DIAGNOSIS — R279 Unspecified lack of coordination: Secondary | ICD-10-CM | POA: Diagnosis not present

## 2015-08-29 DIAGNOSIS — R6889 Other general symptoms and signs: Secondary | ICD-10-CM

## 2015-08-29 DIAGNOSIS — R29898 Other symptoms and signs involving the musculoskeletal system: Secondary | ICD-10-CM

## 2015-08-29 DIAGNOSIS — R269 Unspecified abnormalities of gait and mobility: Secondary | ICD-10-CM

## 2015-08-29 DIAGNOSIS — R531 Weakness: Secondary | ICD-10-CM

## 2015-08-29 DIAGNOSIS — M6281 Muscle weakness (generalized): Secondary | ICD-10-CM

## 2015-08-29 NOTE — Therapy (Signed)
[facility] [address] [place], [place], [address] Phone: [phone]   Fax:  [phone]  Physical Therapy Treatment  Patient Details  Name: Ebony Warren MRN: [id number] Date of Birth: 1976-11-30 Referring Provider:  Penni Bombard, MD  Encounter Date: 08/29/2015      PT End of Session - 08/29/15 1933    Visit Number 14   Number of Visits 18   Date for PT Re-Evaluation 08/30/15   Authorization Type G-Code    PT Start Time 1451   PT Stop Time 1530   PT Time Calculation (min) 39 min   Equipment Utilized During Treatment Gait belt   Activity Tolerance Patient tolerated treatment well;Patient limited by fatigue   Behavior During Therapy [facility] for tasks assessed/performed      Past Medical History  Diagnosis Date  . Seizures     told that they are not from the brain but stress related  . Bradycardia   . Anxiety and depression     Past Surgical History  Procedure Laterality Date  . Cholecystectomy    . Knee surgery      There were no vitals filed for this visit.  Visit Diagnosis:  Abnormality of gait  Right leg weakness  Muscle weakness (generalized)      Subjective Assessment - 08/29/15 1510    Subjective Pt reports having sustained a fall yesterday due to puppy pulling her leash. Pt was uninjured. Upon further questioning, pt shared that she was not using AD at time of fall.  Pt reports owning a standard cane, which was her brothers; however, pt unable to safely use due to inappropriate height.   Patient Stated Goals To run again, walk without walker and hopefully no device, improve balance   Currently in Pain? No/denies                         [facility] Adult PT Treatment/Exercise - 08/29/15 0001    Ambulation/Gait   Ambulation/Gait Yes   Ambulation/Gait Assistance 4: Min guard;4: Min assist   Ambulation/Gait Assistance Details Tactile, verbal cueing for upright posture  (focus on B hip extension), increased R hip/knee flexion during RLE advancement, and forward gaze.   Ambulation Distance (Feet) 395 Feet  x115', x80', x200'   Assistive device Straight cane  R FootUp brace   Gait Pattern Step-through pattern;Decreased step length - left;Decreased step length - right;Decreased stride length;Decreased stance time - right;Decreased hip/knee flexion - right;Trunk flexed;Decreased dorsiflexion - right;Right circumduction   Ambulation Surface Level;Unlevel;Indoor;Outdoor;Paved   Stairs --   Door Management 4: Min assist   Door Managment Details (indicate cue type and reason) using [place]   Curb 4: Min assist   Curb Details (indicate cue type and reason) using [facility]                PT Education - 08/29/15 1524    Education provided Yes   Education Details During gait training, discussed quickly gazing down at surface changes/obstacles on ground but avoiding flexed posture.   Person(s) Educated Patient   Methods Explanation;Demonstration;Verbal cues   Comprehension Verbalized understanding;Returned demonstration          PT Short Term Goals - 08/15/15 1500    PT SHORT TERM GOAL #1   Title Verbalizes understanding of fall prevention strategies in home (Target Date: 07/31/2015)   Time 1   Period Months   Status On-going   PT SHORT TERM GOAL #2  Title demonstrates / verbalizes understanding of initial HEP (Target Date: 07/31/2015)   Baseline MET 08/15/2015 with exercises instructed thus far   Time 1   Period Months   Status Achieved   PT SHORT TERM GOAL #3   Title patient reports pain improved 25% (Target Date: 07/31/2015)   Baseline MET 08/15/2015   Time 1   Period Months   Status Achieved   PT SHORT TERM GOAL #4   Title Timed Up & Go with SBQC <13 seconds. (Target Date: 07/31/2015)   Baseline Partially MET TUG with rollator 15.31sec and cane with minA 13.78sec   Time 1   Period Months   Status Partially Met           PT Long Term Goals -  07/05/15 1451    PT LONG TERM GOAL #1   Title Patient demonstrates / verbalizes appropriate ongoing fitness plan / HEP. (Target Date: 08/30/2015)   Time 2   Period Months   Status On-going   PT LONG TERM GOAL #2   Title Patient verbalizes understanding of MS percautions with MS. . (Target Date: 08/30/2015)   Time 2   Period Months   Status On-going   PT LONG TERM GOAL #3   Title Increase Berg Balance >/= 45/56 . (Target Date: 08/30/2015)   Time 2   Period Months   Status On-going   PT LONG TERM GOAL #4   Title Timed Up & Go without device cognitive increases <50% from std. TUG . (Target Date: 08/30/2015)   Time 2   Period Months   Status On-going   PT LONG TERM GOAL #5   Title [place] with SBQC <30sec safely. . (Target Date: 08/30/2015)   Time 2   Period Months   Status On-going   PT LONG TERM GOAL #6   Title Lower Extremity pain improved 50% from initial evaluation. . (Target Date: 08/30/2015)   Time 2   Period Months   Status On-going               Plan - 08/29/15 1936    Clinical Impression Statement Skilled session focused on gait training with SPC and R FootUp brace. Noted decreased gait stability with functional head turns (transitioning from looking at feet to looking at obstacles ahead). Pt reports that size of personal cane isn't safe/appopriate. Continue per POC.   Pt will benefit from skilled therapeutic intervention in order to improve on the following deficits Abnormal gait;Decreased activity tolerance;Decreased balance;Decreased endurance;Decreased mobility;Decreased safety awareness;Decreased strength;Pain;Postural dysfunction   Rehab Potential Good   PT Frequency 2x / week   PT Duration Other (comment)  9 weeks   PT Treatment/Interventions ADLs/Self Care Home Management;DME Instruction;Gait training;Stair training;Functional mobility training;Therapeutic activities;Therapeutic exercise;Balance training;Neuromuscular re-education;Patient/family education    PT Next Visit Plan Gait with SPC, continue strengthening and balance activities.    PT Home Exercise Plan Balance HEP   Consulted and Agree with Plan of Care Patient        Problem List Patient Active Problem List   Diagnosis Date Noted  . Multiple sclerosis 10/31/2014  . Hemiplegia, unspecified, affecting dominant side 08/30/2014    Billie Ruddy, PT, DPT [facility] [address] [place], [place], [address] Phone: [phone]   Fax:  [phone] 08/29/2015, 7:40 PM
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
## 2015-08-30 ENCOUNTER — Ambulatory Visit: Payer: Medicare Other | Admitting: Physical Therapy

## 2015-08-30 ENCOUNTER — Encounter: Payer: Medicare Other | Admitting: Occupational Therapy

## 2015-09-03 ENCOUNTER — Ambulatory Visit: Payer: Medicare Other | Admitting: Physical Therapy

## 2015-09-03 ENCOUNTER — Encounter: Payer: Self-pay | Admitting: Physical Therapy

## 2015-09-03 ENCOUNTER — Ambulatory Visit: Payer: Medicare Other | Admitting: Occupational Therapy

## 2015-09-03 ENCOUNTER — Encounter: Payer: Self-pay | Admitting: Occupational Therapy

## 2015-09-03 DIAGNOSIS — R6889 Other general symptoms and signs: Secondary | ICD-10-CM

## 2015-09-03 DIAGNOSIS — R279 Unspecified lack of coordination: Secondary | ICD-10-CM | POA: Diagnosis not present

## 2015-09-03 DIAGNOSIS — R252 Cramp and spasm: Secondary | ICD-10-CM

## 2015-09-03 DIAGNOSIS — R269 Unspecified abnormalities of gait and mobility: Secondary | ICD-10-CM

## 2015-09-03 DIAGNOSIS — R2689 Other abnormalities of gait and mobility: Secondary | ICD-10-CM

## 2015-09-03 DIAGNOSIS — R531 Weakness: Secondary | ICD-10-CM

## 2015-09-03 DIAGNOSIS — R4189 Other symptoms and signs involving cognitive functions and awareness: Secondary | ICD-10-CM

## 2015-09-03 DIAGNOSIS — R2681 Unsteadiness on feet: Secondary | ICD-10-CM

## 2015-09-03 DIAGNOSIS — M25641 Stiffness of right hand, not elsewhere classified: Secondary | ICD-10-CM

## 2015-09-03 DIAGNOSIS — H539 Unspecified visual disturbance: Secondary | ICD-10-CM
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
## 2015-09-04 NOTE — Therapy (Signed)
[facility] [address] [place], [place], [address] Phone: [phone]   Fax:  [phone]  Physical Therapy Treatment  Patient Details  Name: Ebony Warren MRN: [id number] Date of Birth: [birth year] Referring Provider:  Penni Bombard, MD  Encounter Date: 09/03/2015  PHYSICAL THERAPY DISCHARGE SUMMARY  Visits from Start of Care: 15  Current functional level related to goals / functional outcomes: See below   Remaining deficits: See below   Education / Equipment: HEP / fitness plan, MS education & fall prevention strategies  Plan: Patient agrees to discharge.  Patient goals were partially met. Patient is being discharged due to meeting the stated rehab goals.  ?????          PT End of Session - 09/03/15 1100    Visit Number 15   Number of Visits 18   Date for PT Re-Evaluation 08/30/15   Authorization Type G-Code    PT Start Time 1105   PT Stop Time 1149   PT Time Calculation (min) 44 min   Equipment Utilized During Treatment Gait belt   Activity Tolerance Patient tolerated treatment well;Patient limited by fatigue   Behavior During Therapy WFL for tasks assessed/performed      Past Medical History  Diagnosis Date  . Seizures     told that they are not from the brain but stress related  . Bradycardia   . Anxiety and depression     Past Surgical History  Procedure Laterality Date  . Cholecystectomy    . Knee surgery      There were no vitals filed for this visit.  Visit Diagnosis:  Generalized weakness  Abnormality of gait  Unsteadiness  Balance problems      Subjective Assessment - 09/03/15 1106    Subjective No falls. She has been doing his exercises   Currently in Pain? Yes   Pain Score 5    Pain Location Leg   Pain Orientation Posterior   Pain Descriptors / Indicators Aching   Pain Type Chronic pain   Pain Onset More than a month ago   Pain Frequency Constant   Aggravating Factors  continuing with activity or position   Pain Relieving Factors if standing then sitting, or if sitting then stand up, changing positions or activities            [facility] PT Assessment - 09/03/15 1100    Berg Balance Test   Sit to Stand Able to stand without using hands and stabilize independently   Standing Unsupported Able to stand safely 2 minutes   Sitting with Back Unsupported but Feet Supported on Floor or Stool Able to sit safely and securely 2 minutes   Stand to Sit Sits safely with minimal use of hands   Transfers Able to transfer safely, minor use of hands   Standing Unsupported with Eyes Closed Able to stand 10 seconds safely   Standing Ubsupported with Feet Together Able to place feet together independently and stand 1 minute safely   From Standing, Reach Forward with Outstretched Arm Can reach confidently >25 cm (10")   From Standing Position, Pick up Object from Floor Able to pick up shoe safely and easily   From Standing Position, Turn to Look Behind Over each Shoulder Looks behind from both sides and weight shifts well   Turn 360 Degrees Able to turn 360 degrees safely but slowly   Standing Unsupported, Alternately Place Feet on Step/Stool Able to complete >2 steps/needs minimal assist  Standing Unsupported, One Foot in [place] to take small step independently and hold 30 seconds   Standing on One Leg Tries to lift leg/unable to hold 3 seconds but remains standing independently   Total Score 46   Timed Up and Go Test   Normal TUG (seconds) 11.78  single point cane with quad tip   Cognitive TUG (seconds) 17.47  48% increase from std TUG today but 7sec improve from initia   Four Square Step Test    Trial One  30.47  with single point cane quad tip no falls or balance losses   Trial Two 37.75  with single point cane quad cane "fall" stopped by PT     Patient verbalized ongoing HEP / fitness program. Patient verbalized fall prevention strategies  and MS issues.                [facility] Adult PT Treatment/Exercise - 09/03/15 1100    Transfers   Transfers Sit to Stand   Ambulation/Gait   Ambulation/Gait Yes   Ambulation/Gait Assistance 6: Modified independent (Device/Increase time)   Ambulation Distance (Feet) 400 Feet  400' with rollator & 125" with cane   Assistive device Rollator;Straight cane   Gait Pattern Step-through pattern;Trunk flexed;Wide base of support   Ambulation Surface Indoor;Level   Gait velocity 2.83 ft/sec   Door Management 6: Modified independent (Device/Increase time)  rollator walker   Curb 6: Modified independent (Device/increase time)  rollator walker                  PT Short Term Goals - 08/15/15 1500    PT SHORT TERM GOAL #1   Title Verbalizes understanding of fall prevention strategies in home (Target Date: 07/31/2015)   Time 1   Period Months   Status On-going   PT SHORT TERM GOAL #2   Title demonstrates / verbalizes understanding of initial HEP (Target Date: 07/31/2015)   Baseline MET 08/15/2015 with exercises instructed thus far   Time 1   Period Months   Status Achieved   PT SHORT TERM GOAL #3   Title patient reports pain improved 25% (Target Date: 07/31/2015)   Baseline MET 08/15/2015   Time 1   Period Months   Status Achieved   PT SHORT TERM GOAL #4   Title Timed Up & Go with SBQC <13 seconds. (Target Date: 07/31/2015)   Baseline Partially MET TUG with rollator 15.31sec and cane with minA 13.78sec   Time 1   Period Months   Status Partially Met           PT Long Term Goals - 09/03/15 1100    PT LONG TERM GOAL #1   Title Patient demonstrates / verbalizes appropriate ongoing fitness plan / HEP. (Target Date: 08/30/2015)   Baseline MET 09/03/2015    Time 2   Period Months   Status Achieved   PT LONG TERM GOAL #2   Title Patient verbalizes understanding of MS percautions with MS. . (Target Date: 08/30/2015)   Baseline MET 09/03/2015   Time 2   Period Months    Status Achieved   PT LONG TERM GOAL #3   Title Increase Berg Balance >/= 45/56 . (Target Date: 08/30/2015)   Baseline MET 9/.27/2016   Merrilee Jansky Balance 46/56   Time 2   Period Months   Status Achieved   PT LONG TERM GOAL #4   Title Timed Up & Go without device cognitive increases <50% from std. TUG . (Target Date: 08/30/2015)  Baseline MET 09-30-15   Standard TUG 11.78sec and cognitive TUG 17.47sec (48.3% increase). Both times improved from initial eval with std TUG now <13sec (fall risk cut-off)   Time 2   Period Months   Status Achieved   PT LONG TERM GOAL #5   Title Four Square with SBQC <30sec safely. . (Target Date: 08/30/2015)   Baseline Partially MET September 30, 2015 Four Square with single point cane quad tip 30.47sec first trial and 37.75sec with "fall" stopped by PT second trial. Patient able to demonstrate understanding of safe environment set-up to practice so works on abliity to step all directions.   Time 2   Period Months   Status Partially Met   PT LONG TERM GOAL #6   Title Lower Extremity pain improved 50% from initial evaluation. . (Target Date: 08/30/2015)   Baseline MET 09-30-2015   Time 2   Period Months   Status Achieved               Plan - 2015-09-30 1100    Clinical Impression Statement Patient met fully 5 of 6 LTGs and partially MET other LTG. Patient has improved her mobility and reports able to do more with less falls. Her Berg Balance of 46/56, TUG of 11/78sec both indicate lower fall risk.   Pt will benefit from skilled therapeutic intervention in order to improve on the following deficits Abnormal gait;Decreased activity tolerance;Decreased balance;Decreased endurance;Decreased mobility;Decreased safety awareness;Decreased strength;Pain;Postural dysfunction   Rehab Potential Good   PT Frequency 1x / week for re-evaluation on September 30, 2015   PT Duration Other (comment)     PT Treatment/Interventions DME Instruction;Gait training;Stair training;Functional mobility  training;Therapeutic activities;Therapeutic exercise;Balance training;Neuromuscular re-education;Patient/family education   PT Next Visit Plan Discharge PT   Consulted and Agree with Plan of Care Patient          G-Codes - 09/30/15 1100    Functional Assessment Tool Used Berg Balance 46/56   Functional Limitation Mobility: Walking and moving around   Mobility: Walking and Moving Around Goal Status [phone]) At least 20 percent but less than 40 percent impaired, limited or restricted   Mobility: Walking and Moving Around Discharge Status [phone]) At least 20 percent but less than 40 percent impaired, limited or restricted      Problem List Patient Active Problem List   Diagnosis Date Noted  . Multiple sclerosis 10/31/2014  . Hemiplegia, unspecified, affecting dominant side 08/30/2014    Aslyn Cottman PT, DPT 09/04/2015, 7:55 AM  [facility] [address] [place], [place], [address] Phone: [phone]   Fax:  [phone]

## 2015-09-05 ENCOUNTER — Ambulatory Visit: Payer: Medicare Other | Admitting: Occupational Therapy

## 2015-09-05 ENCOUNTER — Encounter: Payer: Self-pay | Admitting: Occupational Therapy

## 2015-09-05 ENCOUNTER — Ambulatory Visit: Payer: Medicare Other | Admitting: Physical Therapy

## 2015-09-05 DIAGNOSIS — M25641 Stiffness of right hand, not elsewhere classified: Secondary | ICD-10-CM

## 2015-09-05 DIAGNOSIS — R531 Weakness: Secondary | ICD-10-CM

## 2015-09-05 DIAGNOSIS — R279 Unspecified lack of coordination: Secondary | ICD-10-CM | POA: Diagnosis not present

## 2015-09-05 DIAGNOSIS — R252 Cramp and spasm: Secondary | ICD-10-CM
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
## 2015-09-05 NOTE — Patient Instructions (Signed)
Your Splint This splint should initially be fitted by a healthcare practitioner.  The healthcare practitioner is responsible for providing wearing instructions and precautions to the patient, other healthcare practitioners and care provider involved in the patient's care.  This splint was custom made for you. Please read the following instructions to learn about wearing and caring for your splint.  Precautions Should your splint cause any of the following problems, remove the splint immediately and contact your therapist/physician.  Swelling  Severe Pain  Pressure Areas  Stiffness  Numbness  Do not wear your splint while operating machinery unless it has been fabricated for that purpose.  When To Wear Your Splint Where your splint according to your therapist/physician instructions. Daytime for 3 hours the for first 2 days.  If no problems after the first 2 days, wear only at night.  Care and Cleaning of Your Splint 1. Keep your splint away from open flames. 2. Your splint will lose its shape in temperatures over 135 degrees Farenheit, ( in car windows, near radiators, ovens or in hot water).  Never make any adjustments to your splint, if the splint needs adjusting remove it and make an appointment to see your therapist. 3. Your splint, including the cushion liner may be cleaned with soap and lukewarm water.  Do not immerse in hot water over 135 degrees Farenheit. 4. Straps may be washed with soap and water, but do not moisten the self-adhesive portion.   Or wipe with rubbing alcohol. 5. For ink or hard to remove spots use a scouring cleanser which contains chlorine.  Rinse the splint thoroughly after using chlorine cleanser. 6.

## 2015-09-06 ENCOUNTER — Ambulatory Visit: Payer: Medicare Other | Admitting: Physical Therapy

## 2015-09-06 ENCOUNTER — Encounter: Payer: Medicare Other | Admitting: Occupational Therapy

## 2015-09-10 ENCOUNTER — Encounter: Payer: Self-pay | Admitting: Occupational Therapy

## 2015-09-10 ENCOUNTER — Ambulatory Visit: Payer: Medicare Other | Attending: Diagnostic Neuroimaging | Admitting: Occupational Therapy

## 2015-09-10 ENCOUNTER — Ambulatory Visit: Payer: Medicare Other | Admitting: Physical Therapy

## 2015-09-10 DIAGNOSIS — M25641 Stiffness of right hand, not elsewhere classified: Secondary | ICD-10-CM | POA: Insufficient documentation

## 2015-09-10 DIAGNOSIS — R279 Unspecified lack of coordination: Secondary | ICD-10-CM | POA: Insufficient documentation

## 2015-09-10 DIAGNOSIS — H539 Unspecified visual disturbance: Secondary | ICD-10-CM | POA: Diagnosis not present

## 2015-09-10 DIAGNOSIS — R258 Other abnormal involuntary movements: Secondary | ICD-10-CM | POA: Insufficient documentation

## 2015-09-10 DIAGNOSIS — R252 Cramp and spasm: Secondary | ICD-10-CM

## 2015-09-10 DIAGNOSIS — R531 Weakness: Secondary | ICD-10-CM | POA: Diagnosis not present

## 2015-09-10 DIAGNOSIS — R6889 Other general symptoms and signs: Secondary | ICD-10-CM | POA: Insufficient documentation

## 2015-09-10 DIAGNOSIS — R4189 Other symptoms and signs involving cognitive functions and awareness: Secondary | ICD-10-CM | POA: Insufficient documentation
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
## 2015-09-12 ENCOUNTER — Ambulatory Visit: Payer: Medicare Other | Admitting: Physical Therapy

## 2015-09-12 ENCOUNTER — Ambulatory Visit: Payer: Medicare Other | Admitting: Occupational Therapy

## 2015-09-12 ENCOUNTER — Encounter: Payer: Self-pay | Admitting: Occupational Therapy

## 2015-09-12 DIAGNOSIS — R531 Weakness: Secondary | ICD-10-CM

## 2015-09-12 DIAGNOSIS — M25641 Stiffness of right hand, not elsewhere classified: Secondary | ICD-10-CM

## 2015-09-12 DIAGNOSIS — R252 Cramp and spasm: Secondary | ICD-10-CM

## 2015-09-12 DIAGNOSIS — R279 Unspecified lack of coordination: Secondary | ICD-10-CM

## 2015-09-12 DIAGNOSIS — H539 Unspecified visual disturbance: Secondary | ICD-10-CM | POA: Diagnosis not present

## 2015-09-12 DIAGNOSIS — R258 Other abnormal involuntary movements: Secondary | ICD-10-CM | POA: Diagnosis not present

## 2015-09-12 DIAGNOSIS — R6889 Other general symptoms and signs: Secondary | ICD-10-CM

## 2015-09-12 DIAGNOSIS — R4189 Other symptoms and signs involving cognitive functions and awareness: Secondary | ICD-10-CM | POA: Diagnosis not present
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
## 2015-09-13 ENCOUNTER — Encounter: Payer: Medicare Other | Admitting: Occupational Therapy

## 2015-09-17 ENCOUNTER — Ambulatory Visit: Payer: Medicare Other | Admitting: Physical Therapy

## 2015-09-17 ENCOUNTER — Encounter: Payer: Self-pay | Admitting: Occupational Therapy

## 2015-09-17 ENCOUNTER — Ambulatory Visit: Payer: Medicare Other | Admitting: Occupational Therapy

## 2015-09-17 DIAGNOSIS — R4189 Other symptoms and signs involving cognitive functions and awareness: Secondary | ICD-10-CM | POA: Diagnosis not present

## 2015-09-17 DIAGNOSIS — R531 Weakness: Secondary | ICD-10-CM | POA: Diagnosis not present

## 2015-09-17 DIAGNOSIS — R258 Other abnormal involuntary movements: Secondary | ICD-10-CM | POA: Diagnosis not present

## 2015-09-17 DIAGNOSIS — R279 Unspecified lack of coordination: Secondary | ICD-10-CM | POA: Diagnosis not present

## 2015-09-17 DIAGNOSIS — M25641 Stiffness of right hand, not elsewhere classified: Secondary | ICD-10-CM | POA: Diagnosis not present

## 2015-09-17 DIAGNOSIS — R252 Cramp and spasm: Secondary | ICD-10-CM

## 2015-09-17 DIAGNOSIS — H539 Unspecified visual disturbance: Secondary | ICD-10-CM | POA: Diagnosis not present
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
## 2015-09-19 ENCOUNTER — Ambulatory Visit: Payer: Medicare Other | Admitting: Occupational Therapy

## 2015-09-19 ENCOUNTER — Ambulatory Visit: Payer: Medicare Other | Admitting: Physical Therapy

## 2015-09-20 ENCOUNTER — Encounter: Payer: Medicare Other | Admitting: Occupational Therapy

## 2015-09-24 ENCOUNTER — Ambulatory Visit: Payer: Medicare Other | Admitting: Occupational Therapy

## 2015-09-24 ENCOUNTER — Encounter: Payer: Self-pay | Admitting: Occupational Therapy

## 2015-09-24 ENCOUNTER — Ambulatory Visit: Payer: Medicare Other | Admitting: Physical Therapy

## 2015-09-24 DIAGNOSIS — M25641 Stiffness of right hand, not elsewhere classified: Secondary | ICD-10-CM

## 2015-09-24 DIAGNOSIS — R531 Weakness: Secondary | ICD-10-CM | POA: Diagnosis not present

## 2015-09-24 DIAGNOSIS — R279 Unspecified lack of coordination: Secondary | ICD-10-CM | POA: Diagnosis not present

## 2015-09-24 DIAGNOSIS — H539 Unspecified visual disturbance: Secondary | ICD-10-CM | POA: Diagnosis not present

## 2015-09-24 DIAGNOSIS — R4189 Other symptoms and signs involving cognitive functions and awareness: Secondary | ICD-10-CM | POA: Diagnosis not present

## 2015-09-24 DIAGNOSIS — R252 Cramp and spasm: Secondary | ICD-10-CM

## 2015-09-24 DIAGNOSIS — R258 Other abnormal involuntary movements: Secondary | ICD-10-CM | POA: Diagnosis not present
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
## 2015-09-26 ENCOUNTER — Ambulatory Visit: Payer: Medicare Other | Admitting: Physical Therapy

## 2015-09-26 ENCOUNTER — Ambulatory Visit: Payer: Medicare Other | Admitting: Occupational Therapy

## 2015-09-26 ENCOUNTER — Encounter: Payer: Self-pay | Admitting: Occupational Therapy

## 2015-09-26 DIAGNOSIS — R252 Cramp and spasm: Secondary | ICD-10-CM

## 2015-09-26 DIAGNOSIS — M25641 Stiffness of right hand, not elsewhere classified: Secondary | ICD-10-CM

## 2015-09-26 DIAGNOSIS — R258 Other abnormal involuntary movements: Secondary | ICD-10-CM | POA: Diagnosis not present

## 2015-09-26 DIAGNOSIS — R4189 Other symptoms and signs involving cognitive functions and awareness: Secondary | ICD-10-CM

## 2015-09-26 DIAGNOSIS — R279 Unspecified lack of coordination: Secondary | ICD-10-CM

## 2015-09-26 DIAGNOSIS — H539 Unspecified visual disturbance: Secondary | ICD-10-CM | POA: Diagnosis not present

## 2015-09-26 DIAGNOSIS — R531 Weakness: Secondary | ICD-10-CM
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
## 2015-10-02 DIAGNOSIS — F411 Generalized anxiety disorder: Secondary | ICD-10-CM | POA: Diagnosis not present

## 2015-10-03 DIAGNOSIS — F411 Generalized anxiety disorder: Secondary | ICD-10-CM | POA: Diagnosis not present

## 2015-10-15 ENCOUNTER — Telehealth: Payer: Self-pay | Admitting: Diagnostic Neuroimaging

## 2015-10-15 MED ORDER — DIMETHYL FUMARATE 240 MG PO CPDR: 240.0000 mg | DELAYED_RELEASE_CAPSULE | Freq: Two times a day (BID) | ORAL | Status: DC

## 2015-10-15 NOTE — Telephone Encounter (Signed)
[facility] called and states that pt needs refill on tecfidera. They wanted to know if pt will need a starter pack . They will be the ones dispensing the medication. Please call [phone],

## 2015-10-15 NOTE — Telephone Encounter (Signed)
Patient is currently on Tecfidera therapy, no starter pack is necessary.  Rx has been provided for maintenance dose.

## 2015-10-22 ENCOUNTER — Encounter: Payer: Self-pay | Admitting: Diagnostic Neuroimaging

## 2015-10-22 ENCOUNTER — Ambulatory Visit: Payer: Self-pay | Admitting: Diagnostic Neuroimaging

## 2015-10-22 ENCOUNTER — Ambulatory Visit (INDEPENDENT_AMBULATORY_CARE_PROVIDER_SITE_OTHER): Payer: Medicare Other | Admitting: Diagnostic Neuroimaging

## 2015-10-22 VITALS — BP 102/65 | HR 64 | Ht 67.0 in | Wt 198.4 lb

## 2015-10-22 DIAGNOSIS — E559 Vitamin D deficiency, unspecified: Secondary | ICD-10-CM | POA: Diagnosis not present

## 2015-10-22 DIAGNOSIS — G35 Multiple sclerosis: Secondary | ICD-10-CM | POA: Diagnosis not present

## 2015-10-22 DIAGNOSIS — N39498 Other specified urinary incontinence: Secondary | ICD-10-CM | POA: Diagnosis not present

## 2015-10-22 MED ORDER — AMANTADINE HCL 100 MG PO CAPS: 100.0000 mg | ORAL_CAPSULE | Freq: Two times a day (BID) | ORAL | Status: DC

## 2015-10-22 NOTE — Progress Notes (Signed)
[facility]  PATIENT: Ebony Warren DOB: February 13, 1976  REFERRING CLINICIAN: Williams  HISTORY FROM: patient  REASON FOR VISIT: follow up   HISTORICAL  CHIEF COMPLAINT:  Chief Complaint  Patient presents with  . Follow-up    In room 6 by herself. Here for f/u MS. Taking Tecfidera as prescribed. Said she had a flare up last week. She is getting out of breath when she does any short of activity. She had tingling/burning pain in face.    HISTORY OF PRESENT ILLNESS:   UPDATE 10/22/15: Since last visit was doing well. Then last week had new onset weakness in legs, bladder incontinence, strong smelling urine, and hand tremors. Now sxs almost fully resolved since yesterday.  UPDATE 05/01/15: Symptoms stable. Tolerating tecfidera.  UPDATE 01/25/15: Since last visit, had more problems for a few weeks (lower ext weakness, bladder incont), but now improved. Now on tecfidera x 3 weeks. Some itching and stomach issues with tecfidera, but mild. Main issues now consist of lower extremity pain, spasms, urinary leakage, daytime fatigue.  UPDATE 10/31/14: Since last visit, sxs are stable. Test results and diagnosis reviewed. No new events. Using cane to walk.  UPDATE (10/02/14): 39 year old right-handed femalemale here for evaluation of double vision. 2008 patient was 6 months pregnant with her son, when all of a sudden she didn't feel good. She noticed right hand clumsiness and incoordination. This lasted for approximately 9 months and then stopped. She did not seek medical attention for this problem. 2013 patient had onset of right arm weakness, right leg weakness, numbness on the right side, intermittent shaking sensation all over. Patient was evaluated in [place] and diagnosed with possible stroke. 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 Patient did not recover right arm or leg strength. Her speech was also affected. October 2014 patient moved to [facility].  08/30/2014 patient was at home hanging clothes over the bathtub when she fell down. She is not sure what caused her fall. Her right leg may have given out. Patient fell and struck her head against the soap dish which broke off. Patient did not lose consciousness. Patient's family advised her to go to the emergency room. Patient was evaluated with CT and MRI of the brain. No acute findings were found. MRI of the brain did show multiple white matter lesions suspicious for chronic multiple sclerosis versus chronic small vessel ischemic disease. Advised to follow-up in outpatient basis. Since this fall patient has noticed some additional blurred vision, and double vision especially when looking to the left side.   REVIEW OF SYSTEMS: Full 14 system review of systems performed and notable only for fatigue double vision flushing incont bladder food allergies shortness of breath confusion depression anxiety hyperactive memory loss numbness weakness.    ALLERGIES: Allergies  Allergen Reactions  . Orange Fruit [Citrus]     Makes her itchy and her face "feels like its on fire"  . Percocet [Oxycodone-Acetaminophen]     Stomach cramps   . Vicodin [Hydrocodone-Acetaminophen]     Stomach cramps   . Other Rash    narcotics    HOME MEDICATIONS: Outpatient Prescriptions Prior to Visit  Medication Sig Dispense Refill  . baclofen (LIORESAL) 10 MG tablet Take 1 tablet (10 mg total) by mouth 3 (three) times daily as needed for muscle spasms. 90 tablet 6  . cholecalciferol (VITAMIN D) 1000 UNITS tablet Take 1,000 Units by mouth daily. Take one a day for 6-12 months and we will recheck your levels when you come back in  the office in 6 months    . Dimethyl Fumarate 240 MG CPDR Take 1 capsule (240 mg total) by mouth 2 (two) times daily. 60 capsule 1  . escitalopram (LEXAPRO) 20 MG tablet Take 20 mg by mouth daily.    Marland Kitchen LORazepam (ATIVAN) 1 MG tablet Take 1 mg by mouth daily.     No facility-administered  medications prior to visit.    PAST MEDICAL HISTORY: Past Medical History  Diagnosis Date  . Seizures (HCC)     told that they are not from the brain but stress related  . Bradycardia   . Anxiety and depression     PAST SURGICAL HISTORY: Past Surgical History  Procedure Laterality Date  . Cholecystectomy    . Knee surgery      FAMILY HISTORY: Family History  Problem Relation Age of Onset  . Hypertension Mother   . Atrial fibrillation Mother   . Hypertension Father   . Atrial fibrillation Maternal Grandmother     SOCIAL HISTORY:  Social History   Social History  . Marital Status: Single    Spouse Name: N/A  . Number of Children: 1  . Years of Education: College   Occupational History  .  Other    disabled   Social History Main Topics  . Smoking status: Former Smoker -- 0.35 packs/day for 3 years    Types: Cigarettes    Quit date: 12/07/2006  . Smokeless tobacco: Not on file  . Alcohol Use: No     Comment: quit: 2013 (socially)  . Drug Use: Yes    Special: Marijuana     Comment: quit: 2001   . Sexual Activity: Not on file   Other Topics Concern  . Not on file   Social History Narrative   Patient lives at home with family.   Caffeine Use: drinks a cup of hot tea with tecfidera in the am   Patient is right handed.   Patient has a college education     PHYSICAL EXAM  Filed Vitals:   10/22/15 1459  BP: 102/65  Pulse: 64  Height:  (1.702 m)  Weight: 198 lb 6.4 oz (89.994 kg)   No exam data present   Body mass index is 31.07 kg/(m^2).  GENERAL EXAM: Patient is in no distress; well developed, nourished and groomed; neck is supple  CARDIOVASCULAR: Regular rate and rhythm, no murmurs, no carotid bruits  NEUROLOGIC: MENTAL STATUS: awake, alert, language fluent, comprehension intact, naming intact, fund of knowledge appropriate; SLOW SCANNING SPEECH PATTERN CRANIAL NERVE: PUPILS PINPOINT AND REACTIVE; RIGHT EYE MORE SENSITIVE TO LIGHT;  visual fields full to confrontation, extraocular muscles intact; facial sensation symmetric, face symm, hearing intact, palate elevates symmetrically, uvula midline, shoulder shrug symmetric, tongue midline. MOTOR: INCR TONE IN RIGHT ARM AND RIGHT LEG; RUE 4+; RLE 3 PROX, 4 DISTAL; LUE AND LLE NORMAL SENSORY: DECR IN RIGHT HAND AND RIGHT FOOT TO ALL MODALITIES COORDINATION: RUE ATAXIA; LUE NORMAL REFLEXES: BUE 2; BLE KNEES 3, ANKLES 2 GAIT/STATION: SPASTIC, HEMIPARETIC GAIT WITH RIGHT LEG WEAKNESS/STIFFNESS; UNSTEADY; USING 4 POINT CANE.    DIAGNOSTIC DATA (LABS, IMAGING, TESTING) - I reviewed patient records, labs, notes, testing and imaging myself where available.  Lab Results  Component Value Date   WBC 5.3 05/01/2015   HGB 12.7 01/25/2015   HCT 38.9 05/01/2015   MCV 95 01/25/2015   PLT 209 01/25/2015      Component Value Date/Time   NA 142 05/01/2015 0953   NA  138 08/30/2014 1031   K 3.7 05/01/2015 0953   CL 102 05/01/2015 0953   CO2 26 05/01/2015 0953   GLUCOSE 67 05/01/2015 0953   GLUCOSE 91 08/30/2014 1031   BUN 9 05/01/2015 0953   BUN 9 08/30/2014 1031   CREATININE 0.72 05/01/2015 0953   CALCIUM 8.9 05/01/2015 0953   PROT 6.7 05/01/2015 0953   PROT 6.6 08/30/2014 1031   ALBUMIN 4.2 05/01/2015 0953   ALBUMIN 3.5 08/30/2014 1031   AST 15 05/01/2015 0953   ALT 12 05/01/2015 0953   ALKPHOS 44 05/01/2015 0953   BILITOT 0.4 05/01/2015 0953   BILITOT 0.4 10/02/2014 1236   GFRNONAA 107 05/01/2015 0953   GFRAA 124 05/01/2015 0953   No results found for: CHOL No results found for: HGBA1C No results found for: VITAMINB12 No results found for: TSH  VIT D, 25-HYDROXY  Date Value Ref Range Status  05/01/2015 25.3* 30.0 - 100.0 ng/mL Final    Comment:    Vitamin D deficiency has been defined by the Institute of Medicine and an Endocrine Society practice guideline as a level of serum 25-OH vitamin D less than 20 ng/mL (1,2). The Endocrine Society went on to further  define vitamin D insufficiency as a level between 21 and 29 ng/mL (2). 1. IOM (Institute of Medicine). 2010. Dietary reference    intakes for calcium and D. [place]: The    [employer]. 2. Holick MF, Binkley [place], Bischoff-Ferrari HA, et al.    Evaluation, treatment, and prevention of vitamin D    deficiency: an Endocrine Society clinical practice    guideline. JCEM. 2011 Jul; 96(7):1911-30.   10/31/2014 15.1* 30.0 - 100.0 ng/mL Final    Comment:    Vitamin D deficiency has been defined by the Institute of Medicine and an Endocrine Society practice guideline as a level of serum 25-OH vitamin D less than 20 ng/mL (1,2). The Endocrine Society went on to further define vitamin D insufficiency as a level between 21 and 29 ng/mL (2). 1. IOM (Institute of Medicine). 2010. Dietary reference    intakes for calcium and D. [place]: The    [employer]. 2. Holick MF, Binkley [place], Bischoff-Ferrari HA, et al.    Evaluation, treatment, and prevention of vitamin D    deficiency: an Endocrine Society clinical practice    guideline. JCEM. 2011 Jul; 96(7):1911-30.    LYMPHOCYTES ABSOLUTE  Date Value Ref Range Status  05/01/2015 1.3 0.7 - 3.1 x10E3/uL Final  01/25/2015 1.7 0.7 - 3.1 x10E3/uL Final  10/02/2014 1.7 0.7 - 3.1 x10E3/uL Final   LYMPHS ABS  Date Value Ref Range Status  08/30/2014 1.5 0.7 - 4.0 K/uL Final    08/30/14 MRI BRAIN - No acute infarct or acute intracranial abnormality identified, however, there there is scattered abnormal signal in the brain which could indicate either multifocal prior ischemia or sequelae of demyelinating disease. Does the past medical history suggest the possibility of multiple sclerosis, or conversely does the patient have risk factors for stroke at this young age? Consider also a source of paradoxic emboli (e.g. patent foramen ovale). There is also subtle evidence of abnormal signal in the left dorsal brainstem at the  cervicomedullary junction (series 5, image 4, and possibly also continuing into the dorsal cervical spinal cord at C2 (series 5, image 1). These signal changes are also suggested on coronal T2 images, but subtle.   10/24/14 MRI brain (with) 1. Few round and ovoid peri-callosal and subcortical T2  hyperintense plaques, suspicious for demyelinating disease. Other autoimmune, inflammatory, post-infectious or microvascular etiologies are possible. 2. No abnormal enhancing lesions.  10/24/14 MRI cervical spine (with and without)  1. Multiple T2 hyperintense spinal cord lesion from cervico-medullary junction down to C7 level (~8 lesions), more posteriorly in the spinal cord. Findings suspicious for chronic demyelinating disease. 2. No abnormal enhancing lesions.  Labs - ANA, ANCA, ACE, HIV, RPR - all negative  11/01/14 anti-JCV ab - 1.31 (H) positive    ASSESSMENT AND PLAN  39 y.o. year old female here with multiple focal neurologic attacks since 2008. 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 Other labs negative. Now on tecfidera since Feb 2016. Now new flare up symptoms since Nov 2016. Will check MRI, labs and UA.   PLAN: - check MRI brain and cervical spine - check labs today - check UA/Ucx - may consider steroids for MS falre if labs negative for infection - start amantadine for fatigue - continue baclofen for muscle spasms - continue tecfidera - continue MVI supplements  Orders Placed This Encounter  Procedures  . MR Brain W Wo Contrast  . MR Cervical Spine W Wo Contrast  . UA/M w/rflx Culture, Routine  . CBC with Differential/Platelet  . Comprehensive metabolic panel  . VITAMIN D 25 Hydroxy (Vit-D Deficiency, Fractures)   Return in about 3 months (around 01/22/2016).    Suanne Marker, MD 10/22/2015, 3:09  PM Certified in Neurology, Neurophysiology and Neuroimaging  [facility] [address] [place], [place] [address] [phone]

## 2015-10-22 NOTE — Patient Instructions (Signed)
Thank you for coming to see [name] at [facility]. I hope we have been able to provide you high quality care today.  You may receive a patient satisfaction survey over the next few weeks. We would appreciate your feedback and comments so that we may continue to improve ourselves and the health of our patients.  - start amantadine 174m twice a day for multiple sclerosis related fatigue - I will check MRI and labs   ~~~~~~~~~~~~~~~~~~~~~~~~~~~~~~~~~~~~~~~~~~~~~~~~~~~~~~~~~~~~~~~~~  DR. Inita Uram'S GUIDE TO HAPPY AND HEALTHY LIVING These are some of my general health and wellness recommendations. Some of them may apply to you better than others. Please use common sense as you try these suggestions and feel free to ask me any questions.   ACTIVITY/FITNESS Mental, social, emotional and physical stimulation are very important for brain and body health. Try learning a new activity (arts, music, language, sports, games).  Keep moving your body to the best of your abilities. You can do this at home, inside or outside, the park, community center, gym or anywhere you like. Consider a physical therapist or personal trainer to get started. Consider the app Sworkit. Fitness trackers such as smart-watches, smart-phones or Fitbits can help as well.   NUTRITION Eat more plants: colorful vegetables, nuts, seeds and berries.  Eat less sugar, salt, preservatives and processed foods.  Avoid toxins such as cigarettes and alcohol.  Drink water when you are thirsty. Warm water with a slice of lemon is an excellent morning drink to start the day.  Consider these websites for more information The Nutrition Source (hhttps://www.henry-hernandez.biz/ Precision Nutrition (wWindowBlog.ch   RELAXATION Consider practicing mindfulness meditation or other relaxation techniques such as deep breathing, prayer, yoga, tai chi, massage. See website mindful.org or  the apps Headspace or Calm to help get started.   SLEEP Try to get at least 7-8+ hours sleep per day. Regular exercise and reduced caffeine will help you sleep better. Practice good sleep hygeine techniques. See website sleep.org for more information.   PLANNING Prepare estate planning, living will, healthcare POA documents. Sometimes this is best planned with the help of an attorney. Theconversationproject.org and agingwithdignity.org are excellent resources.

## 2015-10-23 NOTE — Telephone Encounter (Signed)
I called [facility] back to clarify.  Spoke with Abran Duke.  Says all requested info would not be necessary if we will contact ins for them.  She provided me with the current prescription benefit info, [employer] Medicare Part D, ID # [id number]  Group # [id number].  I have contacted ins and provided the clinical info.  Request is currently under review Ref # [id number]  Ins has approved the request for coverage on Tecfidera effective until 10/22/2016

## 2015-10-23 NOTE — Telephone Encounter (Signed)
[facility] [phone] called, states she has left multiple messages for Stanton Kidney in Medical Records with no response. Needs treatment notes, lab results and MRI for Prior Authorization for Tecfidera. Direct fax# [phone].

## 2015-10-24 ENCOUNTER — Telehealth: Payer: Self-pay | Admitting: *Deleted

## 2015-10-24 MED ORDER — SULFAMETHOXAZOLE-TRIMETHOPRIM 800-160 MG PO TABS: 1.0000 | ORAL_TABLET | Freq: Two times a day (BID) | ORAL | Status: DC

## 2015-10-24 NOTE — Addendum Note (Signed)
Addended byJoycelyn Schmid on: 10/24/2015 05:10 PM   Modules accepted: Orders

## 2015-10-24 NOTE — Telephone Encounter (Signed)
Spoke to patient and informed her, per Dr Marjory Lies, all labs are normal except UA. Informed her that Dr Marjory Lies prescribed antibiotic to pharmacy listed in [facility]. She requested script be called to [facility] [address]; this was done. Advised patient and her mother that she complete entire prescription. She verbalized understanding.

## 2015-10-25 ENCOUNTER — Ambulatory Visit: Payer: Self-pay | Admitting: Diagnostic Neuroimaging

## 2015-10-25 LAB — UA/M W/RFLX CULTURE, ROUTINE
Bilirubin, UA: NEGATIVE
Glucose, UA: NEGATIVE
NITRITE UA: POSITIVE — AB
PH UA: 8 — AB (ref 5.0–7.5)
Specific Gravity, UA: 1.023 (ref 1.005–1.030)
Urobilinogen, Ur: 1 mg/dL (ref 0.2–1.0)

## 2015-10-25 LAB — COMPREHENSIVE METABOLIC PANEL
ALBUMIN: 4.1 g/dL (ref 3.5–5.5)
ALT: 9 IU/L (ref 0–32)
AST: 15 IU/L (ref 0–40)
Albumin/Globulin Ratio: 1.7 (ref 1.1–2.5)
Alkaline Phosphatase: 42 IU/L (ref 39–117)
BUN / CREAT RATIO: 17 (ref 8–20)
BUN: 12 mg/dL (ref 6–20)
Bilirubin Total: 0.4 mg/dL (ref 0.0–1.2)
CALCIUM: 8.8 mg/dL (ref 8.7–10.2)
CO2: 26 mmol/L (ref 18–29)
CREATININE: 0.71 mg/dL (ref 0.57–1.00)
Chloride: 103 mmol/L (ref 97–106)
GFR, EST AFRICAN AMERICAN: 125 mL/min/{1.73_m2} (ref >59)
GFR, EST NON AFRICAN AMERICAN: 108 mL/min/{1.73_m2} (ref >59)
GLOBULIN, TOTAL: 2.4 g/dL (ref 1.5–4.5)
GLUCOSE: 91 mg/dL (ref 65–99)
Potassium: 4.1 mmol/L (ref 3.5–5.2)
SODIUM: 139 mmol/L (ref 136–144)
TOTAL PROTEIN: 6.5 g/dL (ref 6.0–8.5)

## 2015-10-25 LAB — CBC WITH DIFFERENTIAL/PLATELET
BASOS ABS: 0 10*3/uL (ref 0.0–0.2)
Basos: 0 %
EOS (ABSOLUTE): 0.1 10*3/uL (ref 0.0–0.4)
EOS: 1 %
HEMATOCRIT: 40.3 % (ref 34.0–46.6)
HEMOGLOBIN: 13.2 g/dL (ref 11.1–15.9)
IMMATURE GRANS (ABS): 0 10*3/uL (ref 0.0–0.1)
IMMATURE GRANULOCYTES: 0 %
LYMPHS ABS: 2 10*3/uL (ref 0.7–3.1)
LYMPHS: 37 %
MCH: 32 pg (ref 26.6–33.0)
MCHC: 32.8 g/dL (ref 31.5–35.7)
MCV: 98 fL — ABNORMAL HIGH (ref 79–97)
MONOCYTES: 9 %
Monocytes Absolute: 0.5 10*3/uL (ref 0.1–0.9)
Neutrophils Absolute: 2.7 10*3/uL (ref 1.4–7.0)
Neutrophils: 53 %
Platelets: 196 10*3/uL (ref 150–379)
RBC: 4.13 x10E6/uL (ref 3.77–5.28)
RDW: 13.6 % (ref 12.3–15.4)
WBC: 5.2 10*3/uL (ref 3.4–10.8)

## 2015-10-25 LAB — MICROSCOPIC EXAMINATION: CASTS: NONE SEEN /LPF

## 2015-10-25 LAB — URINE CULTURE, REFLEX

## 2015-10-25 LAB — VITAMIN D 25 HYDROXY (VIT D DEFICIENCY, FRACTURES): VIT D 25 HYDROXY: 35.7 ng/mL (ref 30.0–100.0)

## 2015-10-29 DIAGNOSIS — F411 Generalized anxiety disorder: Secondary | ICD-10-CM | POA: Diagnosis not present

## 2015-11-05 ENCOUNTER — Ambulatory Visit
Admission: RE | Admit: 2015-11-05 | Discharge: 2015-11-05 | Disposition: A | Discharge status: Home / Self Care | Payer: Medicare Other | Source: Ambulatory Visit | Attending: Diagnostic Neuroimaging | Admitting: Diagnostic Neuroimaging

## 2015-11-05 DIAGNOSIS — G35 Multiple sclerosis: Secondary | ICD-10-CM

## 2015-11-05 MED ORDER — GADOBENATE DIMEGLUMINE 529 MG/ML IV SOLN
19.0000 mL | Freq: Once | INTRAVENOUS | Status: AC | PRN
  Administered 2015-11-05: 19 mL via INTRAVENOUS

## 2015-11-19 ENCOUNTER — Telehealth: Payer: Self-pay | Admitting: Diagnostic Neuroimaging

## 2015-11-19 NOTE — Telephone Encounter (Signed)
Called pt back. Advised Dr Penumalli/mary clare out of office today. Dr Marjory Lies will be back tomorrow and I will try and call her tomorrow w/ an answer. She verbalized understanding.

## 2015-11-19 NOTE — Telephone Encounter (Signed)
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 Marjory Lies can do or prescribe for this.

## 2015-11-20 NOTE — Telephone Encounter (Signed)
i dont think it is tecfidera associated. Have her follow up with PCP. -VRP

## 2015-11-20 NOTE — Telephone Encounter (Signed)
Called pt back and relayed Dr Richrd Humbles message below. Pt verbalized understanding. She is going to call PCP and make appt.

## 2015-11-27 ENCOUNTER — Telehealth: Payer: Self-pay | Admitting: Diagnostic Neuroimaging

## 2015-11-27 NOTE — Telephone Encounter (Signed)
Spoke with [facility] and informed him, per Dr Richrd Humbles OV note 10/22/15, he prescribed amantidine that day for patient's fatigue. He then attempted to transfer call to pharmacist. Unable to speak directly with pharmacist, LVM on pharmacist's line informing that Dr Marjory Lies prescribed amantadine on 10/22/15 for fatigue. Also informed that per our record, patient is receiving Tecfidera from their pharmacy. Left this caller's name, number for questions.

## 2015-11-27 NOTE — Telephone Encounter (Signed)
Spoke with patient who states that after she changed the time of day she was taking some of her medications she no longer has fatigue unless it is at the end of the day. So she is not taking Amantadine. She then stated that she has always gotten Tecfidera from [facility] and that in 2017 [facility] will assist her with transitioning prescription to "private pharmacy". Informed her this RN will call [facility] and inform. Spoke with Starlint who then transferred to voice mail of pharmacist, LVM informing them of conversation with patient.

## 2015-11-27 NOTE — Telephone Encounter (Signed)
[facility] called and states the pt is telling them that amantadine (SYMMETREL) 100 MG capsule was d/c'd by this office. They just need to verify this .They also want to know if the pt is getting her tecfadera from the manufacturer  Please call [phone],

## 2015-12-26 DIAGNOSIS — F411 Generalized anxiety disorder: Secondary | ICD-10-CM | POA: Diagnosis not present

## 2016-01-20 ENCOUNTER — Telehealth: Payer: Self-pay | Admitting: Diagnostic Neuroimaging

## 2016-01-20 NOTE — Telephone Encounter (Signed)
Heli -[id number] opt 3, opt 1, fax [phone] with [facility] is calling for a Rx Tecfidera 240 mg 2 X day.  Thanks!

## 2016-01-21 MED ORDER — DIMETHYL FUMARATE 240 MG PO CPDR: 240.0000 mg | DELAYED_RELEASE_CAPSULE | Freq: Two times a day (BID) | ORAL | Status: DC

## 2016-01-21 NOTE — Telephone Encounter (Signed)
Patient called to advise Tecfidera RX needs to go to [facility]

## 2016-01-21 NOTE — Telephone Encounter (Signed)
Prescription e scribed to [facility].

## 2016-01-21 NOTE — Telephone Encounter (Addendum)
Spoke with JessiShanda Bumpsith [employer] who stated patient is enrolled in drug free program with [employer], [phone] for her Tecfidera.  Spoke with Sherre Poot who stated patient was last shipped medication on 10/17/15 for 3 month supply. She stated [employer] has stopped her benefits with [employer], and she advised calling [employer] to investigate.   Called [employer], spoke with Tiffany who stated she has been re-enrolled in free drug program as of 01/20/16. She stated that either a prescription can be faxed to [facility] [phone] or dr can wait for [employer] to reach out for new prescription.  Prescription faxed to above number.  Patient notified, verbalized understanding.

## 2016-01-21 NOTE — Addendum Note (Signed)
Addended by: Colen Darling C on: 01/21/2016 05:00 PM   Modules accepted: Orders

## 2016-01-27 ENCOUNTER — Ambulatory Visit (INDEPENDENT_AMBULATORY_CARE_PROVIDER_SITE_OTHER): Payer: Medicare Other | Admitting: Diagnostic Neuroimaging

## 2016-01-27 ENCOUNTER — Encounter: Payer: Self-pay | Admitting: Diagnostic Neuroimaging

## 2016-01-27 VITALS — BP 103/67 | HR 74 | Ht 67.0 in | Wt 199.0 lb

## 2016-01-27 DIAGNOSIS — G35 Multiple sclerosis: Secondary | ICD-10-CM | POA: Diagnosis not present

## 2016-01-27 DIAGNOSIS — F329 Major depressive disorder, single episode, unspecified: Secondary | ICD-10-CM | POA: Diagnosis not present

## 2016-01-27 DIAGNOSIS — R4781 Slurred speech: Secondary | ICD-10-CM | POA: Diagnosis not present

## 2016-01-27 DIAGNOSIS — F32A Depression, unspecified: Secondary | ICD-10-CM | POA: Insufficient documentation

## 2016-01-27 DIAGNOSIS — R32 Unspecified urinary incontinence: Secondary | ICD-10-CM | POA: Diagnosis not present

## 2016-01-27 DIAGNOSIS — R5383 Other fatigue: Secondary | ICD-10-CM | POA: Insufficient documentation

## 2016-01-27 MED ORDER — BACLOFEN 10 MG PO TABS: 10.0000 mg | ORAL_TABLET | Freq: Three times a day (TID) | ORAL | Status: DC | PRN

## 2016-01-27 NOTE — Progress Notes (Signed)
[facility]  PATIENT: Ebony Warren DOB: 1976-01-03  REFERRING CLINICIAN: Williams  HISTORY FROM: patient  REASON FOR VISIT: follow up   HISTORICAL  CHIEF COMPLAINT:  Chief Complaint  Patient presents with  . Multiple Sclerosis    rm 7, Caregiver-Tammy, "stiffness in R han";  Tecfidera, 10/2014 JCV, 10/2015 MRI brain  . Follow-up    3 month    HISTORY OF PRESENT ILLNESS:   UPDATE 01/27/16: Since last visit, fatigue improved with adjusting medication timing. Bladder issues stable. Had UTI at last visit, tx'd with abx, now better.  UPDATE 10/22/15: Since last visit was doing well. Then last week had new onset weakness in legs, bladder incontinence, strong smelling urine, and hand tremors. Now sxs almost fully resolved since yesterday.  UPDATE 05/01/15: Symptoms stable. Tolerating tecfidera.  UPDATE 01/25/15: Since last visit, had more problems for a few weeks (lower ext weakness, bladder incont), but now improved. Now on tecfidera x 3 weeks. Some itching and stomach issues with tecfidera, but mild. Main issues now consist of lower extremity pain, spasms, urinary leakage, daytime fatigue.  UPDATE 10/31/14: Since last visit, sxs are stable. Test results and diagnosis reviewed. No new events. Using cane to walk.  UPDATE (10/02/14): 40 year old right-handed female here for evaluation of double vision. 2008 patient was 6 months pregnant with her son, when all of a sudden she didn't feel good. She noticed right hand clumsiness and incoordination. This lasted for approximately 9 months and then stopped. She did not seek medical attention for this problem. 2013 patient had onset of right arm weakness, right leg weakness, numbness on the right side, intermittent shaking sensation all over. Patient was evaluated in [place] and diagnosed with possible stroke. 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 Patient did not recover right arm or leg strength. Her speech  was also affected. October 2014 patient moved to [facility]. 08/30/2014 patient was at home hanging clothes over the bathtub when she fell down. She is not sure what caused her fall. Her right leg may have given out. Patient fell and struck her head against the soap dish which broke off. Patient did not lose consciousness. Patient's family advised her to go to the emergency room. Patient was evaluated with CT and MRI of the brain. No acute findings were found. MRI of the brain did show multiple white matter lesions suspicious for chronic multiple sclerosis versus chronic small vessel ischemic disease. Advised to follow-up in outpatient basis. Since this fall patient has noticed some additional blurred vision, and double vision especially when looking to the left side.   REVIEW OF SYSTEMS: Full 14 system review of systems performed and notable only for fatigue double vision flushing incont bladder food allergies shortness of breath confusion depression anxiety hyperactive memory loss numbness weakness hyperactive ringing in ears leg swelling.    ALLERGIES: Allergies  Allergen Reactions  . Orange Fruit [Citrus]     Makes her itchy and her face "feels like its on fire"  . Percocet [Oxycodone-Acetaminophen]     Stomach cramps   . Vicodin [Hydrocodone-Acetaminophen]     Stomach cramps   . Other Rash    narcotics    HOME MEDICATIONS: Outpatient Prescriptions Prior to Visit  Medication Sig Dispense Refill  . baclofen (LIORESAL) 10 MG tablet Take 1 tablet (10 mg total) by mouth 3 (three) times daily as needed for muscle spasms. 90 tablet 6  . cholecalciferol (VITAMIN D) 1000 UNITS tablet Take 1,000 Units by mouth daily. Take one a day  for 6-12 months and we will recheck your levels when you come back in the office in 6 months    . Dimethyl Fumarate 240 MG CPDR Take 1 capsule (240 mg total) by mouth 2 (two) times daily. 180 capsule 4  . escitalopram (LEXAPRO) 20 MG tablet Take 20 mg by mouth  daily.    Marland Kitchen LORazepam (ATIVAN) 1 MG tablet Take 1 mg by mouth daily.    . Multiple Vitamins-Minerals (MULTIVITAMIN ADULT PO) Take 1 tablet by mouth daily.    . naproxen sodium (ANAPROX) 220 MG tablet Take 220 mg by mouth 2 (two) times daily with a meal.    . amantadine (SYMMETREL) 100 MG capsule Take 1 capsule (100 mg total) by mouth 2 (two) times daily. 60 capsule 6  . Influenza Vac Typ A&B Surf Ant (FLUVIRIN) 0.5 ML SUSY ADM 0.5ML IM UTD    . sulfamethoxazole-trimethoprim (BACTRIM DS,SEPTRA DS) 800-160 MG tablet Take 1 tablet by mouth 2 (two) times daily. 10 tablet 0   No facility-administered medications prior to visit.    PAST MEDICAL HISTORY: Past Medical History  Diagnosis Date  . Seizures (HCC)     told that they are not from the brain but stress related  . Bradycardia   . Anxiety and depression     PAST SURGICAL HISTORY: Past Surgical History  Procedure Laterality Date  . Cholecystectomy    . Knee surgery      FAMILY HISTORY: Family History  Problem Relation Age of Onset  . Hypertension Mother   . Atrial fibrillation Mother   . Hypertension Father   . Atrial fibrillation Maternal Grandmother     SOCIAL HISTORY:  Social History   Social History  . Marital Status: Single    Spouse Name: N/A  . Number of Children: 1  . Years of Education: College   Occupational History  .  Other    disabled   Social History Main Topics  . Smoking status: Former Smoker -- 0.35 packs/day for 3 years    Types: Cigarettes    Quit date: 12/07/2006  . Smokeless tobacco: Not on file  . Alcohol Use: No     Comment: quit: 2013 (socially)  . Drug Use: Yes    Special: Marijuana     Comment: quit: 2001   . Sexual Activity: Not on file   Other Topics Concern  . Not on file   Social History Narrative   Patient lives at home with family.   Caffeine Use: drinks a cup of hot tea with tecfidera in the am   Patient is right handed.   Patient has a college education      PHYSICAL EXAM  Filed Vitals:   01/27/16 1111  BP: 103/67  Pulse: 74  Height: 5\' 7"  (1.702 m)  Weight: 199 lb (90.266 kg)    Visual Acuity Screening   Right eye Left eye Both eyes  Without correction:     With correction: 20/50 20/70      Body mass index is 31.16 kg/(m^2).  GENERAL EXAM: Patient is in no distress; well developed, nourished and groomed; neck is supple  CARDIOVASCULAR: Regular rate and rhythm, no murmurs, no carotid bruits  NEUROLOGIC: MENTAL STATUS: awake, alert, language fluent, comprehension intact, naming intact, fund of knowledge appropriate; SLOW SCANNING SPEECH PATTERN CRANIAL NERVE: PUPILS PINPOINT AND REACTIVE; RIGHT EYE MORE SENSITIVE TO LIGHT; visual fields full to confrontation, extraocular muscles intact; facial sensation symmetric, face symm, hearing intact, palate elevates symmetrically, uvula midline,  shoulder shrug symmetric, tongue midline. MOTOR: INCR TONE IN RIGHT ARM AND RIGHT LEG; RUE 4+; RLE 3 PROX, 4 DISTAL; LUE AND LLE NORMAL SENSORY: DECR IN RIGHT HAND AND RIGHT FOOT TO ALL MODALITIES COORDINATION: BUE DYSMETRIA; SLOW FOOT TAP ON RIGHT REFLEXES: BUE 2; BLE KNEES 3, ANKLES 2 GAIT/STATION: SPASTIC, HEMIPARETIC GAIT WITH RIGHT LEG WEAKNESS/STIFFNESS; UNSTEADY; USING 4 POINT CANE.    DIAGNOSTIC DATA (LABS, IMAGING, TESTING) - I reviewed patient records, labs, notes, testing and imaging myself where available.  Lab Results  Component Value Date   WBC 5.2 10/22/2015   HGB 12.7 01/25/2015   HCT 40.3 10/22/2015   MCV 98* 10/22/2015   PLT 196 10/22/2015      Component Value Date/Time   NA 139 10/22/2015 1538   NA 138 08/30/2014 1031   K 4.1 10/22/2015 1538   CL 103 10/22/2015 1538   CO2 26 10/22/2015 1538   GLUCOSE 91 10/22/2015 1538   GLUCOSE 91 08/30/2014 1031   BUN 12 10/22/2015 1538   BUN 9 08/30/2014 1031   CREATININE 0.71 10/22/2015 1538   CALCIUM 8.8 10/22/2015 1538   PROT 6.5 10/22/2015 1538   PROT 6.6  08/30/2014 1031   ALBUMIN 4.1 10/22/2015 1538   ALBUMIN 3.5 08/30/2014 1031   AST 15 10/22/2015 1538   ALT 9 10/22/2015 1538   ALKPHOS 42 10/22/2015 1538   BILITOT 0.4 10/22/2015 1538   BILITOT 0.4 10/02/2014 1236   GFRNONAA 108 10/22/2015 1538   GFRAA 125 10/22/2015 1538   No results found for: CHOL No results found for: HGBA1C No results found for: VITAMINB12 No results found for: TSH  VIT D, 25-HYDROXY  Date Value Ref Range Status  10/22/2015 35.7 30.0 - 100.0 ng/mL Final    Comment:    Vitamin D deficiency has been defined by the Institute of Medicine and an Endocrine Society practice guideline as a level of serum 25-OH vitamin D less than 20 ng/mL (1,2). The Endocrine Society went on to further define vitamin D insufficiency as a level between 21 and 29 ng/mL (2). 1. IOM (Institute of Medicine). 2010. Dietary reference    intakes for calcium and D. [place]: The    [employer]. 2. Holick MF, Binkley [place], Bischoff-Ferrari HA, et al.    Evaluation, treatment, and prevention of vitamin D    deficiency: an Endocrine Society clinical practice    guideline. JCEM. 2011 Jul; 96(7):1911-30.   05/01/2015 25.3* 30.0 - 100.0 ng/mL Final    Comment:    Vitamin D deficiency has been defined by the Institute of Medicine and an Endocrine Society practice guideline as a level of serum 25-OH vitamin D less than 20 ng/mL (1,2). The Endocrine Society went on to further define vitamin D insufficiency as a level between 21 and 29 ng/mL (2). 1. IOM (Institute of Medicine). 2010. Dietary reference    intakes for calcium and D. [place]: The    [employer]. 2. Holick MF, Binkley [place], Bischoff-Ferrari HA, et al.    Evaluation, treatment, and prevention of vitamin D    deficiency: an Endocrine Society clinical practice    guideline. JCEM. 2011 Jul; 96(7):1911-30.   10/31/2014 15.1* 30.0 - 100.0 ng/mL Final    Comment:    Vitamin D deficiency has been  defined by the Institute of Medicine and an Endocrine Society practice guideline as a level of serum 25-OH vitamin D less than 20 ng/mL (1,2). The Endocrine Society went on to further define vitamin  D insufficiency as a level between 21 and 29 ng/mL (2). 1. IOM (Institute of Medicine). 2010. Dietary reference    intakes for calcium and D. [place]: The    [employer]. 2. Holick MF, Binkley [place], Bischoff-Ferrari HA, et al.    Evaluation, treatment, and prevention of vitamin D    deficiency: an Endocrine Society clinical practice    guideline. JCEM. 2011 Jul; 96(7):1911-30.    LYMPHOCYTES ABSOLUTE  Date Value Ref Range Status  10/22/2015 2.0 0.7 - 3.1 x10E3/uL Final  05/01/2015 1.3 0.7 - 3.1 x10E3/uL Final  01/25/2015 1.7 0.7 - 3.1 x10E3/uL Final   LYMPHS ABS  Date Value Ref Range Status  08/30/2014 1.5 0.7 - 4.0 K/uL Final    11/05/15 MRI cervical spine (with and without) demonstrating: 1. Multiple (~7-8) chronic demyelinating plaques from cervico-medullary junction down to C7 level. 2. No acute plaques. 3. No change from MRI on 10/24/14.   11/05/15 MRI brain (with and without) demonstrating: 1. Few periventricular and peri-callosal and juxtacortical and medullary chronic demyelinating plaques.  2. No abnormal lesions are seen on post contrast views.  3. No change from MRI on 08/30/14.   Labs - ANA, ANCA, ACE, HIV, RPR - all negative  11/01/14 anti-JCV ab - 1.31 (H) positive    ASSESSMENT AND PLAN  40 y.o. year old female here with multiple focal neurologic attacks since 2008. 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 Other labs negative. Now on tecfidera since Feb 2016. Now new flare up symptoms since Nov 2016. Fatigue improved with medication regiment adjustment. Bladder symptoms stable.  Depression stable (seeing Dr. Inda Coke, [facility]).    Dx:  MS (multiple sclerosis) (HCC)  Slurred speech  Other fatigue  Depression  Urinary incontinence, unspecified incontinence type    PLAN: - continue baclofen for muscle spasms - continue tecfidera - continue MVI supplements  Meds ordered this encounter  Medications  . baclofen (LIORESAL) 10 MG tablet    Sig: Take 1 tablet (10 mg total) by mouth 3 (three) times daily as needed for muscle spasms.    Dispense:  90 tablet    Refill:  12   Return in about 3 months (around 04/25/2016).    Suanne Marker, MD 01/27/2016, 11:20 AM Certified in Neurology, Neurophysiology and Neuroimaging  [facility] [address], [address] [place], [place] [address] [phone]

## 2016-01-27 NOTE — Patient Instructions (Signed)

## 2016-02-04 DIAGNOSIS — F411 Generalized anxiety disorder: Secondary | ICD-10-CM | POA: Diagnosis not present

## 2016-03-26 DIAGNOSIS — H469 Unspecified optic neuritis: Secondary | ICD-10-CM | POA: Diagnosis not present

## 2016-03-26 DIAGNOSIS — H40023 Open angle with borderline findings, high risk, bilateral: Secondary | ICD-10-CM | POA: Diagnosis not present

## 2016-03-26 DIAGNOSIS — G35 Multiple sclerosis: Secondary | ICD-10-CM | POA: Diagnosis not present

## 2016-03-26 DIAGNOSIS — H40003 Preglaucoma, unspecified, bilateral: Secondary | ICD-10-CM | POA: Diagnosis not present

## 2016-04-07 DIAGNOSIS — F411 Generalized anxiety disorder: Secondary | ICD-10-CM | POA: Diagnosis not present

## 2016-04-23 DIAGNOSIS — F411 Generalized anxiety disorder: Secondary | ICD-10-CM | POA: Diagnosis not present

## 2016-05-01 ENCOUNTER — Ambulatory Visit: Payer: Self-pay | Admitting: Diagnostic Neuroimaging

## 2016-05-06 ENCOUNTER — Encounter: Payer: Self-pay | Admitting: Diagnostic Neuroimaging

## 2016-05-07 DIAGNOSIS — F411 Generalized anxiety disorder: Secondary | ICD-10-CM | POA: Diagnosis not present

## 2016-05-13 ENCOUNTER — Encounter: Payer: Self-pay | Admitting: Diagnostic Neuroimaging

## 2016-05-13 ENCOUNTER — Ambulatory Visit (INDEPENDENT_AMBULATORY_CARE_PROVIDER_SITE_OTHER): Payer: Medicare Other | Admitting: Diagnostic Neuroimaging

## 2016-05-13 VITALS — BP 106/69 | HR 69 | Ht 67.0 in | Wt 203.0 lb

## 2016-05-13 DIAGNOSIS — F32A Depression, unspecified: Secondary | ICD-10-CM

## 2016-05-13 DIAGNOSIS — G35 Multiple sclerosis: Secondary | ICD-10-CM | POA: Diagnosis not present

## 2016-05-13 DIAGNOSIS — F329 Major depressive disorder, single episode, unspecified: Secondary | ICD-10-CM | POA: Diagnosis not present

## 2016-05-13 DIAGNOSIS — H469 Unspecified optic neuritis: Secondary | ICD-10-CM

## 2016-05-13 DIAGNOSIS — H47293 Other optic atrophy, bilateral: Secondary | ICD-10-CM | POA: Insufficient documentation

## 2016-05-13 MED ORDER — BACLOFEN 10 MG PO TABS: 10.0000 mg | ORAL_TABLET | Freq: Three times a day (TID) | ORAL | Status: DC | PRN

## 2016-05-13 NOTE — Progress Notes (Signed)
[facility]  PATIENT: Ebony Warren DOB: [birth year]  REFERRING CLINICIAN: Williams  HISTORY FROM: patient  REASON FOR VISIT: follow up   HISTORICAL  CHIEF COMPLAINT:  Chief Complaint  Patient presents with  . Multiple Sclerosis    rm 6, Tecfidera, 10/2015 MRIs, 10/2014 JCV 1.31, "no new problems except a cold"  . Follow-up    3 month    HISTORY OF PRESENT ILLNESS:   UPDATE 05/13/16: Since last visit, doing well. No new MS events or symptoms. In retrospect, may have lost vision in right eye (2015, and possibly March 2016), lasting 3-7 days. Didn't mention to me until now. Recently this was noted optometry and ophthalmology exams.   UPDATE 01/27/16: Since last visit, fatigue improved with adjusting medication timing. Bladder issues stable. Had UTI at last visit, tx'd with abx, now better.  UPDATE 10/22/15: Since last visit was doing well. Then last week had new onset weakness in legs, bladder incontinence, strong smelling urine, and hand tremors. Now sxs almost fully resolved since yesterday.  UPDATE 05/01/15: Symptoms stable. Tolerating tecfidera.  UPDATE 01/25/15: Since last visit, had more problems for a few weeks (lower ext weakness, bladder incont), but now improved. Now on tecfidera x 3 weeks. Some itching and stomach issues with tecfidera, but mild. Main issues now consist of lower extremity pain, spasms, urinary leakage, daytime fatigue.  UPDATE 10/31/14: Since last visit, sxs are stable. Test results and diagnosis reviewed. No new events. Using cane to walk.  UPDATE (10/02/14): 40 year old right-handed female here for evaluation of double vision. 2008 patient was 6 months pregnant with her son, when all of a sudden she didn't feel good. She noticed right hand clumsiness and incoordination. This lasted for approximately 9 months and then stopped. She did not seek medical attention for this problem. 2013 patient had onset of right arm weakness,  right leg weakness, numbness on the right side, intermittent shaking sensation all over. Patient was evaluated in [place] and diagnosed with possible stroke. 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 Patient did not recover right arm or leg strength. Her speech was also affected. October 2014 patient moved to [facility]. 08/30/2014 patient was at home hanging clothes over the bathtub when she fell down. She is not sure what caused her fall. Her right leg may have given out. Patient fell and struck her head against the soap dish which broke off. Patient did not lose consciousness. Patient's family advised her to go to the emergency room. Patient was evaluated with CT and MRI of the brain. No acute findings were found. MRI of the brain did show multiple white matter lesions suspicious for chronic multiple sclerosis versus chronic small vessel ischemic disease. Advised to follow-up in outpatient basis. Since this fall patient has noticed some additional blurred vision, and double vision especially when looking to the left side.   REVIEW OF SYSTEMS: Full 14 system review of systems performed and negative except for: fatigue double vision flushing incont bladder food allergies shortness of breath confusion depression anxiety hyperactive memory loss numbness weakness hyperactive ringing in ears leg swelling.    ALLERGIES: Allergies  Allergen Reactions  . Morphine Itching  . Orange Fruit [Citrus]     Makes her itchy and her face "feels like its on fire"  . Oxycodone Itching  . Percocet [Oxycodone-Acetaminophen]     Stomach cramps   . Vicodin [Hydrocodone-Acetaminophen]     Stomach cramps   . Other Rash    narcotics    HOME MEDICATIONS: Outpatient  Prescriptions Prior to Visit  Medication Sig Dispense Refill  . baclofen (LIORESAL) 10 MG tablet Take 1 tablet (10 mg total) by mouth 3 (three) times daily as needed for muscle spasms. 90 tablet 12  . cholecalciferol (VITAMIN D) 1000 UNITS  tablet Take 1,000 Units by mouth daily. Take one a day for 6-12 months and we will recheck your levels when you come back in the office in 6 months    . Dimethyl Fumarate 240 MG CPDR Take 1 capsule (240 mg total) by mouth 2 (two) times daily. 180 capsule 4  . LORazepam (ATIVAN) 1 MG tablet Take 1 mg by mouth daily.    . Multiple Vitamins-Minerals (MULTIVITAMIN ADULT PO) Take 1 tablet by mouth daily.    . naproxen sodium (ANAPROX) 220 MG tablet Take 220 mg by mouth 2 (two) times daily with a meal.    . escitalopram (LEXAPRO) 20 MG tablet Take 20 mg by mouth daily.     No facility-administered medications prior to visit.    PAST MEDICAL HISTORY: Past Medical History  Diagnosis Date  . Seizures (HCC)     told that they are not from the brain but stress related  . Bradycardia   . Anxiety and depression     PAST SURGICAL HISTORY: Past Surgical History  Procedure Laterality Date  . Cholecystectomy    . Knee surgery      FAMILY HISTORY: Family History  Problem Relation Age of Onset  . Hypertension Mother   . Atrial fibrillation Mother   . Hypertension Father   . Atrial fibrillation Maternal Grandmother     SOCIAL HISTORY:  Social History   Social History  . Marital Status: Single    Spouse Name: N/A  . Number of Children: 1  . Years of Education: College   Occupational History  .  Other    disabled   Social History Main Topics  . Smoking status: Former Smoker -- 0.35 packs/day for 3 years    Types: Cigarettes    Quit date: 12/07/2006  . Smokeless tobacco: Not on file  . Alcohol Use: No     Comment: quit: 2013 (socially)  . Drug Use: Yes    Special: Marijuana     Comment: quit: 2001   . Sexual Activity: Not on file   Other Topics Concern  . Not on file   Social History Narrative   Patient lives at home with family.   Caffeine Use: drinks a cup of hot tea with tecfidera in the am   Patient is right handed.   Patient has a college education     PHYSICAL  EXAM  Filed Vitals:   05/13/16 1442  BP: 106/69  Pulse: 69  Height: 5\' 7"  (1.702 m)  Weight: 203 lb (92.08 kg)   No exam data present   Body mass index is 31.79 kg/(m^2).  GENERAL EXAM: Patient is in no distress; well developed, nourished and groomed; neck is supple  CARDIOVASCULAR: Regular rate and rhythm, no murmurs, no carotid bruits  NEUROLOGIC: MENTAL STATUS: awake, alert, language fluent, comprehension intact, naming intact, fund of knowledge appropriate; SLOW SCANNING SPEECH PATTERN CRANIAL NERVE: PUPILS PINPOINT AND REACTIVE; RIGHT EYE MORE SENSITIVE TO LIGHT; visual fields full to confrontation, extraocular muscles intact; facial sensation symmetric, face symm, hearing intact, palate elevates symmetrically, uvula midline, shoulder shrug symmetric, tongue midline. MOTOR: INCR TONE IN RIGHT ARM AND RIGHT LEG; RUE 4+; RLE 3 PROX, 4 DISTAL; LUE AND LLE NORMAL SENSORY: DECR IN RIGHT  HAND AND RIGHT FOOT TO ALL MODALITIES COORDINATION: BUE DYSMETRIA; SLOW FOOT TAP ON RIGHT REFLEXES: BUE 2; BLE KNEES 3, ANKLES 2 GAIT/STATION: SPASTIC, HEMIPARETIC GAIT WITH RIGHT LEG WEAKNESS/STIFFNESS; UNSTEADY; USING ROLLATOR.    DIAGNOSTIC DATA (LABS, IMAGING, TESTING) - I reviewed patient records, labs, notes, testing and imaging myself where available.  Lab Results  Component Value Date   WBC 5.2 10/22/2015   HGB 12.7 01/25/2015   HCT 40.3 10/22/2015   MCV 98* 10/22/2015   PLT 196 10/22/2015      Component Value Date/Time   NA 139 10/22/2015 1538   NA 138 08/30/2014 1031   K 4.1 10/22/2015 1538   CL 103 10/22/2015 1538   CO2 26 10/22/2015 1538   GLUCOSE 91 10/22/2015 1538   GLUCOSE 91 08/30/2014 1031   BUN 12 10/22/2015 1538   BUN 9 08/30/2014 1031   CREATININE 0.71 10/22/2015 1538   CALCIUM 8.8 10/22/2015 1538   PROT 6.5 10/22/2015 1538   PROT 6.6 08/30/2014 1031   ALBUMIN 4.1 10/22/2015 1538   ALBUMIN 3.5 08/30/2014 1031   AST 15 10/22/2015 1538   ALT 9 10/22/2015  1538   ALKPHOS 42 10/22/2015 1538   BILITOT 0.4 10/22/2015 1538   BILITOT 0.4 10/02/2014 1236   GFRNONAA 108 10/22/2015 1538   GFRAA 125 10/22/2015 1538   No results found for: CHOL No results found for: HGBA1C No results found for: VITAMINB12 No results found for: TSH  VIT D, 25-HYDROXY  Date Value Ref Range Status  10/22/2015 35.7 30.0 - 100.0 ng/mL Final    Comment:    Vitamin D deficiency has been defined by the Institute of Medicine and an Endocrine Society practice guideline as a level of serum 25-OH vitamin D less than 20 ng/mL (1,2). The Endocrine Society went on to further define vitamin D insufficiency as a level between 21 and 29 ng/mL (2). 1. IOM (Institute of Medicine). 2010. Dietary reference    intakes for calcium and D. [place]: The    [employer]. 2. Holick MF, Binkley [place], Bischoff-Ferrari HA, et al.    Evaluation, treatment, and prevention of vitamin D    deficiency: an Endocrine Society clinical practice    guideline. JCEM. 2011 Jul; 96(7):1911-30.   05/01/2015 25.3* 30.0 - 100.0 ng/mL Final    Comment:    Vitamin D deficiency has been defined by the Institute of Medicine and an Endocrine Society practice guideline as a level of serum 25-OH vitamin D less than 20 ng/mL (1,2). The Endocrine Society went on to further define vitamin D insufficiency as a level between 21 and 29 ng/mL (2). 1. IOM (Institute of Medicine). 2010. Dietary reference    intakes for calcium and D. [place]: The    [employer]. 2. Holick MF, Binkley [place], Bischoff-Ferrari HA, et al.    Evaluation, treatment, and prevention of vitamin D    deficiency: an Endocrine Society clinical practice    guideline. JCEM. 2011 Jul; 96(7):1911-30.   10/31/2014 15.1* 30.0 - 100.0 ng/mL Final    Comment:    Vitamin D deficiency has been defined by the Institute of Medicine and an Endocrine Society practice guideline as a level of serum 25-OH vitamin D less than  20 ng/mL (1,2). The Endocrine Society went on to further define vitamin D insufficiency as a level between 21 and 29 ng/mL (2). 1. IOM (Institute of Medicine). 2010. Dietary reference    intakes for calcium and D. [place]: The  [employer]. 2. Holick MF, Binkley [place], Bischoff-Ferrari HA, et al.    Evaluation, treatment, and prevention of vitamin D    deficiency: an Endocrine Society clinical practice    guideline. JCEM. 2011 Jul; 96(7):1911-30.    LYMPHOCYTES ABSOLUTE  Date Value Ref Range Status  10/22/2015 2.0 0.7 - 3.1 x10E3/uL Final  05/01/2015 1.3 0.7 - 3.1 x10E3/uL Final  01/25/2015 1.7 0.7 - 3.1 x10E3/uL Final   LYMPHS ABS  Date Value Ref Range Status  08/30/2014 1.5 0.7 - 4.0 K/uL Final    11/05/15 MRI cervical spine (with and without) demonstrating: 1. Multiple (~7-8) chronic demyelinating plaques from cervico-medullary junction down to C7 level. 2. No acute plaques. 3. No change from MRI on 10/24/14.   11/05/15 MRI brain (with and without) demonstrating: 1. Few periventricular and peri-callosal and juxtacortical and medullary chronic demyelinating plaques.  2. No abnormal lesions are seen on post contrast views.  3. No change from MRI on 08/30/14.   Labs - ANA, ANCA, ACE, HIV, RPR - all negative  11/01/14 anti-JCV ab - 1.31 (H) positive    ASSESSMENT AND PLAN  40 y.o. year old female here with multiple focal neurologic attacks since 2008. 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 Other labs negative.   Now on tecfidera since Feb 2016. Now new flare up symptoms since Nov 2016.   Fatigue improved with medication regiment adjustment.   Bladder symptoms stable.   Depression stable (seeing Dr. Inda Coke, [facility]).    Dx:  MS (multiple sclerosis) (HCC)  Right  optic neuritis  Depression    PLAN: - continue baclofen for muscle spasms - continue tecfidera - continue MVI supplements - check labs  Orders Placed This Encounter  Procedures  . CBC with Differential/Platelet  . Comprehensive metabolic panel   Meds ordered this encounter  Medications  . baclofen (LIORESAL) 10 MG tablet    Sig: Take 1 tablet (10 mg total) by mouth 3 (three) times daily as needed for muscle spasms.    Dispense:  90 tablet    Refill:  12   Return in about 6 months (around 11/12/2016).    Suanne Marker, MD 05/13/2016, 3:14 PM Certified in Neurology, Neurophysiology and Neuroimaging  [facility] [address], [address] [place], [place] [address] [phone]

## 2016-05-14 ENCOUNTER — Telehealth: Payer: Self-pay | Admitting: *Deleted

## 2016-05-14 LAB — CBC WITH DIFFERENTIAL/PLATELET
BASOS ABS: 0 10*3/uL (ref 0.0–0.2)
Basos: 0 %
EOS (ABSOLUTE): 0 10*3/uL (ref 0.0–0.4)
EOS: 1 %
Hematocrit: 39 % (ref 34.0–46.6)
Hemoglobin: 13.3 g/dL (ref 11.1–15.9)
IMMATURE GRANS (ABS): 0 10*3/uL (ref 0.0–0.1)
Immature Granulocytes: 0 %
LYMPHS ABS: 0.8 10*3/uL (ref 0.7–3.1)
LYMPHS: 11 %
MCH: 32.3 pg (ref 26.6–33.0)
MCHC: 34.1 g/dL (ref 31.5–35.7)
MCV: 95 fL (ref 79–97)
MONOCYTES: 7 %
Monocytes Absolute: 0.5 10*3/uL (ref 0.1–0.9)
NEUTROS ABS: 5.8 10*3/uL (ref 1.4–7.0)
Neutrophils: 81 %
PLATELETS: 206 10*3/uL (ref 150–379)
RBC: 4.12 x10E6/uL (ref 3.77–5.28)
RDW: 13.3 % (ref 12.3–15.4)
WBC: 7.2 10*3/uL (ref 3.4–10.8)

## 2016-05-14 LAB — COMPREHENSIVE METABOLIC PANEL
ALT: 13 IU/L (ref 0–32)
AST: 16 IU/L (ref 0–40)
Albumin/Globulin Ratio: 1.3 (ref 1.2–2.2)
Albumin: 3.9 g/dL (ref 3.5–5.5)
Alkaline Phosphatase: 46 IU/L (ref 39–117)
BILIRUBIN TOTAL: 0.6 mg/dL (ref 0.0–1.2)
BUN/Creatinine Ratio: 17 (ref 9–23)
BUN: 14 mg/dL (ref 6–20)
CHLORIDE: 102 mmol/L (ref 96–106)
CO2: 25 mmol/L (ref 18–29)
CREATININE: 0.82 mg/dL (ref 0.57–1.00)
Calcium: 8.9 mg/dL (ref 8.7–10.2)
GFR calc Af Amer: 105 mL/min/{1.73_m2} (ref >59)
GFR calc non Af Amer: 91 mL/min/{1.73_m2} (ref >59)
GLUCOSE: 95 mg/dL (ref 65–99)
Globulin, Total: 2.9 g/dL (ref 1.5–4.5)
Potassium: 4.2 mmol/L (ref 3.5–5.2)
Sodium: 139 mmol/L (ref 134–144)
TOTAL PROTEIN: 6.8 g/dL (ref 6.0–8.5)

## 2016-05-14 NOTE — Telephone Encounter (Signed)
Spoke with patient and informed her lab results are normal. She verbalized understanding, appreciation.

## 2016-07-02 DIAGNOSIS — F411 Generalized anxiety disorder: Secondary | ICD-10-CM | POA: Diagnosis not present

## 2016-07-13 DIAGNOSIS — H47293 Other optic atrophy, bilateral: Secondary | ICD-10-CM | POA: Diagnosis not present

## 2016-07-13 DIAGNOSIS — H53483 Generalized contraction of visual field, bilateral: Secondary | ICD-10-CM | POA: Diagnosis not present

## 2016-07-13 DIAGNOSIS — Z113 Encounter for screening for infections with a predominantly sexual mode of transmission: Secondary | ICD-10-CM | POA: Diagnosis not present

## 2016-07-13 DIAGNOSIS — H40023 Open angle with borderline findings, high risk, bilateral: Secondary | ICD-10-CM | POA: Diagnosis not present

## 2016-07-13 DIAGNOSIS — G35 Multiple sclerosis: Secondary | ICD-10-CM | POA: Diagnosis not present

## 2016-07-16 DIAGNOSIS — F419 Anxiety disorder, unspecified: Secondary | ICD-10-CM | POA: Diagnosis not present

## 2016-07-25 DIAGNOSIS — G35 Multiple sclerosis: Secondary | ICD-10-CM | POA: Diagnosis not present

## 2016-07-25 DIAGNOSIS — H47293 Other optic atrophy, bilateral: Secondary | ICD-10-CM | POA: Diagnosis not present

## 2016-07-30 DIAGNOSIS — F419 Anxiety disorder, unspecified: Secondary | ICD-10-CM | POA: Diagnosis not present

## 2016-08-05 DIAGNOSIS — H40023 Open angle with borderline findings, high risk, bilateral: Secondary | ICD-10-CM | POA: Diagnosis not present

## 2016-08-05 DIAGNOSIS — H47293 Other optic atrophy, bilateral: Secondary | ICD-10-CM | POA: Diagnosis not present

## 2016-08-05 DIAGNOSIS — Z83511 Family history of glaucoma: Secondary | ICD-10-CM | POA: Diagnosis not present

## 2016-08-05 DIAGNOSIS — G35 Multiple sclerosis: Secondary | ICD-10-CM | POA: Diagnosis not present

## 2016-08-05 DIAGNOSIS — H542 Low vision, both eyes: Secondary | ICD-10-CM | POA: Diagnosis not present

## 2016-08-05 DIAGNOSIS — H40003 Preglaucoma, unspecified, bilateral: Secondary | ICD-10-CM | POA: Diagnosis not present

## 2016-08-27 DIAGNOSIS — F419 Anxiety disorder, unspecified: Secondary | ICD-10-CM | POA: Diagnosis not present

## 2016-09-23 DIAGNOSIS — F411 Generalized anxiety disorder: Secondary | ICD-10-CM | POA: Diagnosis not present

## 2016-09-30 DIAGNOSIS — F411 Generalized anxiety disorder: Secondary | ICD-10-CM | POA: Diagnosis not present

## 2016-10-07 DIAGNOSIS — F411 Generalized anxiety disorder: Secondary | ICD-10-CM | POA: Diagnosis not present

## 2016-10-09 DIAGNOSIS — Z23 Encounter for immunization: Secondary | ICD-10-CM | POA: Diagnosis not present

## 2016-10-21 DIAGNOSIS — F411 Generalized anxiety disorder: Secondary | ICD-10-CM | POA: Diagnosis not present

## 2016-11-04 DIAGNOSIS — F411 Generalized anxiety disorder: Secondary | ICD-10-CM | POA: Diagnosis not present

## 2016-11-09 ENCOUNTER — Telehealth: Payer: Self-pay | Admitting: Diagnostic Neuroimaging

## 2016-11-09 NOTE — Telephone Encounter (Signed)
Spoke with patient who stated she has been taking baclofen three times a day x 1 month, and she did get benefit at first. She stated that she is no longer getting benefit, and her right hand is also having spasms, along with both legs.   Her next follow up is 12/22/16. This RN stated she will route her question, request to Dr Marjory Lies and call her later this afternoon with his response. She verbalized understanding, appreciation.

## 2016-11-09 NOTE — Telephone Encounter (Signed)
Patient called and stated that he Rx for baclofen does not seem to be working any longer and she would like to know if she can be changed to a different medication. Please call and advise.

## 2016-11-09 NOTE — Telephone Encounter (Signed)
Attempted to reach patient. Her mother answered, stated patient was unavailable to come to phone. This RN stated will call back later today. Mother stated she would let her daughter know.

## 2016-11-10 MED ORDER — BACLOFEN 10 MG PO TABS: 10.0000 mg | ORAL_TABLET | Freq: Three times a day (TID) | ORAL | 12 refills | Status: DC | PRN

## 2016-11-10 NOTE — Telephone Encounter (Signed)
Increase to baclofen 10-20mg  TID as needed. rx sent in. -VRP

## 2016-11-10 NOTE — Telephone Encounter (Signed)
Per Dr Marjory Lies, spoke with patient and informed her he increased her Baclofen to up 20 mg three times a day as needed. Advised she may take as needed 10 or 20 mg. Informed her Dr Marjory Lies sent new Rx to her pharmacy. She repeated instructions, verbalized understanding, appreciation.

## 2016-11-16 DIAGNOSIS — R0981 Nasal congestion: Secondary | ICD-10-CM | POA: Diagnosis not present

## 2016-11-16 DIAGNOSIS — H60331 Swimmer's ear, right ear: Secondary | ICD-10-CM | POA: Diagnosis not present

## 2016-11-16 DIAGNOSIS — J330 Polyp of nasal cavity: Secondary | ICD-10-CM | POA: Diagnosis not present

## 2016-11-16 DIAGNOSIS — J069 Acute upper respiratory infection, unspecified: Secondary | ICD-10-CM | POA: Diagnosis not present

## 2016-11-17 ENCOUNTER — Ambulatory Visit: Payer: Self-pay | Admitting: Diagnostic Neuroimaging

## 2016-11-18 DIAGNOSIS — F419 Anxiety disorder, unspecified: Secondary | ICD-10-CM | POA: Diagnosis not present

## 2016-11-23 DIAGNOSIS — Z124 Encounter for screening for malignant neoplasm of cervix: Secondary | ICD-10-CM | POA: Diagnosis not present

## 2016-11-23 DIAGNOSIS — G35 Multiple sclerosis: Secondary | ICD-10-CM | POA: Diagnosis not present

## 2016-11-23 DIAGNOSIS — Z01419 Encounter for gynecological examination (general) (routine) without abnormal findings: Secondary | ICD-10-CM | POA: Diagnosis not present

## 2016-11-23 DIAGNOSIS — H60331 Swimmer's ear, right ear: Secondary | ICD-10-CM | POA: Diagnosis not present

## 2016-11-23 DIAGNOSIS — Z0000 Encounter for general adult medical examination without abnormal findings: Secondary | ICD-10-CM | POA: Diagnosis not present

## 2016-11-24 DIAGNOSIS — N760 Acute vaginitis: Secondary | ICD-10-CM | POA: Diagnosis not present

## 2016-11-24 DIAGNOSIS — Z124 Encounter for screening for malignant neoplasm of cervix: Secondary | ICD-10-CM | POA: Diagnosis not present

## 2016-11-24 DIAGNOSIS — Z0000 Encounter for general adult medical examination without abnormal findings: Secondary | ICD-10-CM | POA: Diagnosis not present

## 2016-12-16 DIAGNOSIS — F419 Anxiety disorder, unspecified: Secondary | ICD-10-CM | POA: Diagnosis not present

## 2016-12-22 ENCOUNTER — Ambulatory Visit (INDEPENDENT_AMBULATORY_CARE_PROVIDER_SITE_OTHER): Payer: Medicare Other | Admitting: Diagnostic Neuroimaging

## 2016-12-22 ENCOUNTER — Encounter: Payer: Self-pay | Admitting: Diagnostic Neuroimaging

## 2016-12-22 VITALS — BP 110/68 | HR 68 | Wt 195.6 lb

## 2016-12-22 DIAGNOSIS — H469 Unspecified optic neuritis: Secondary | ICD-10-CM

## 2016-12-22 DIAGNOSIS — G35 Multiple sclerosis: Secondary | ICD-10-CM | POA: Diagnosis not present

## 2016-12-22 DIAGNOSIS — H9201 Otalgia, right ear: Secondary | ICD-10-CM | POA: Diagnosis not present

## 2016-12-22 DIAGNOSIS — E559 Vitamin D deficiency, unspecified: Secondary | ICD-10-CM | POA: Diagnosis not present

## 2016-12-22 DIAGNOSIS — R5383 Other fatigue: Secondary | ICD-10-CM

## 2016-12-22 DIAGNOSIS — F32A Depression, unspecified: Secondary | ICD-10-CM

## 2016-12-22 DIAGNOSIS — M62838 Other muscle spasm: Secondary | ICD-10-CM

## 2016-12-22 DIAGNOSIS — F329 Major depressive disorder, single episode, unspecified: Secondary | ICD-10-CM | POA: Diagnosis not present

## 2016-12-22 MED ORDER — GABAPENTIN 300 MG PO CAPS: 300.0000 mg | ORAL_CAPSULE | Freq: Three times a day (TID) | ORAL | 11 refills | Status: DC

## 2016-12-22 MED ORDER — OXYBUTYNIN CHLORIDE ER 5 MG PO TB24: 5.0000 mg | ORAL_TABLET | Freq: Every day | ORAL | 6 refills | Status: DC

## 2016-12-22 MED ORDER — BACLOFEN 10 MG PO TABS: 20.0000 mg | ORAL_TABLET | Freq: Three times a day (TID) | ORAL | 12 refills | Status: DC

## 2016-12-22 MED ORDER — TIZANIDINE HCL 4 MG PO TABS: 4.0000 mg | ORAL_TABLET | Freq: Four times a day (QID) | ORAL | 0 refills | Status: DC | PRN

## 2016-12-22 NOTE — Progress Notes (Signed)
[facility]  PATIENT: Ebony Warren DOB: [birth year]  REFERRING CLINICIAN: Williams  HISTORY FROM: patient  REASON FOR VISIT: follow up   HISTORICAL  CHIEF COMPLAINT:  Chief Complaint  Patient presents with  . Multiple Sclerosis    rm 7, "left leg hurts so bad, cramps-from thigh to calf, constant pain, worse w/walking; right hand cramps; my right ear feels full; eyesight is worse"  . Follow-up    6 month    HISTORY OF PRESENT ILLNESS:   UPDATE 12/22/16: Since last visit, was doing well until Nov 2017 and then developed stabbing right ear pain (continues to present). Also with new and increasing spasm and cramps in right hand and left leg since Dec 2017. Continues with fatigue and bladder incontinence.  UPDATE 05/13/16: Since last visit, doing well. No new MS events or symptoms. In retrospect, may have lost vision in right eye (2015, and possibly March 2016), lasting 3-7 days. Didn't mention to me until now. Recently this was noted optometry and ophthalmology exams.   UPDATE 01/27/16: Since last visit, fatigue improved with adjusting medication timing. Bladder issues stable. Had UTI at last visit, tx'd with abx, now better.  UPDATE 10/22/15: Since last visit was doing well. Then last week had new onset weakness in legs, bladder incontinence, strong smelling urine, and hand tremors. Now sxs almost fully resolved since yesterday.  UPDATE 05/01/15: Symptoms stable. Tolerating tecfidera.  UPDATE 01/25/15: Since last visit, had more problems for a few weeks (lower ext weakness, bladder incont), but now improved. Now on tecfidera x 3 weeks. Some itching and stomach issues with tecfidera, but mild. Main issues now consist of lower extremity pain, spasms, urinary leakage, daytime fatigue.  UPDATE 10/31/14: Since last visit, sxs are stable. Test results and diagnosis reviewed. No new events. Using cane to walk.  UPDATE (10/02/14): 41 year old right-handed female  here for evaluation of double vision. 2008 patient was 6 months pregnant with her son, when all of a sudden she didn't feel good. She noticed right hand clumsiness and incoordination. This lasted for approximately 9 months and then stopped. She did not seek medical attention for this problem. 2013 patient had onset of right arm weakness, right leg weakness, numbness on the right side, intermittent shaking sensation all over. Patient was evaluated in [place] and diagnosed with possible stroke. 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 Patient did not recover right arm or leg strength. Her speech was also affected. October 2014 patient moved to [facility]. 08/30/2014 patient was at home hanging clothes over the bathtub when she fell down. She is not sure what caused her fall. Her right leg may have given out. Patient fell and struck her head against the soap dish which broke off. Patient did not lose consciousness. Patient's family advised her to go to the emergency room. Patient was evaluated with CT and MRI of the brain. No acute findings were found. MRI of the brain did show multiple white matter lesions suspicious for chronic multiple sclerosis versus chronic small vessel ischemic disease. Advised to follow-up in outpatient basis. Since this fall patient has noticed some additional blurred vision, and double vision especially when looking to the left side.   REVIEW OF SYSTEMS: Full 14 system review of systems performed and negative except for: double vision flushing incont bladder food allergies shortness of breath confusion depression anxiety memory loss numbness weakness hyperactive ringing in ears leg swelling.    ALLERGIES: Allergies  Allergen Reactions  . Morphine Itching  . Orange Fruit [Citrus]  Makes her itchy and her face "feels like its on fire"  . Oxycodone Itching  . Percocet [Oxycodone-Acetaminophen]     Stomach cramps   . Vicodin [Hydrocodone-Acetaminophen]     Stomach  cramps   . Other Rash    narcotics    HOME MEDICATIONS: Outpatient Medications Prior to Visit  Medication Sig Dispense Refill  . baclofen (LIORESAL) 10 MG tablet Take 1-2 tablets (10-20 mg total) by mouth 3 (three) times daily as needed for muscle spasms. 180 tablet 12  . cholecalciferol (VITAMIN D) 1000 UNITS tablet Take 1,000 Units by mouth daily. Take one a day for 6-12 months and we will recheck your levels when you come back in the office in 6 months    . Dimethyl Fumarate 240 MG CPDR Take 1 capsule (240 mg total) by mouth 2 (two) times daily. 180 capsule 4  . escitalopram (LEXAPRO) 20 MG tablet Take 20 mg by mouth.    Marland Kitchen LORazepam (ATIVAN) 1 MG tablet Take 1 mg by mouth daily.    . Multiple Vitamins-Minerals (MULTIVITAMIN ADULT PO) Take 1 tablet by mouth daily.    . naproxen sodium (ANAPROX) 220 MG tablet Take 220 mg by mouth 2 (two) times daily with a meal.    . fexofenadine (ALLEGRA ALLERGY) 180 MG tablet Take by mouth.     No facility-administered medications prior to visit.     PAST MEDICAL HISTORY: Past Medical History:  Diagnosis Date  . Anxiety and depression   . Bradycardia   . Seizures (HCC)    told that they are not from the brain but stress related    PAST SURGICAL HISTORY: Past Surgical History:  Procedure Laterality Date  . CHOLECYSTECTOMY    . KNEE SURGERY      FAMILY HISTORY: Family History  Problem Relation Age of Onset  . Hypertension Mother   . Atrial fibrillation Mother   . Hypertension Father   . Atrial fibrillation Maternal Grandmother     SOCIAL HISTORY:  Social History   Social History  . Marital status: Single    Spouse name: N/A  . Number of children: 1  . Years of education: College   Occupational History  .  Other    disabled   Social History Main Topics  . Smoking status: Former Smoker    Packs/day: 0.35    Years: 3.00    Types: Cigarettes    Quit date: 12/07/2006  . Smokeless tobacco: Never Used  . Alcohol use No      Comment: quit: 2013 (socially)  . Drug use:     Types: Marijuana     Comment: quit: 2001   . Sexual activity: Not on file   Other Topics Concern  . Not on file   Social History Narrative   Patient lives at home with family.   Caffeine Use: drinks a cup of hot tea with tecfidera in the am   Patient is right handed.   Patient has a college education     PHYSICAL EXAM  Vitals:   12/22/16 1524  BP: 110/68  Pulse: 68  Weight: 195 lb 9.6 oz (88.7 kg)    Visual Acuity Screening   Right eye Left eye Both eyes  Without correction:     With correction: 20/50 20/50      Body mass index is 30.64 kg/m.  GENERAL EXAM: Patient is in no distress; well developed, nourished and groomed; neck is supple  CARDIOVASCULAR: Regular rate and rhythm, no murmurs, no  carotid bruits  NEUROLOGIC: MENTAL STATUS: awake, alert, language fluent, comprehension intact, naming intact, fund of knowledge appropriate; SLOW SCANNING SPEECH PATTERN CRANIAL NERVE: PUPILS PINPOINT AND REACTIVE; RIGHT EYE MORE SENSITIVE TO LIGHT; visual fields full to confrontation, extraocular muscles intact; facial sensation symmetric, face symm, hearing intact, palate elevates symmetrically, uvula midline, shoulder shrug symmetric, tongue midline. MOTOR: INCR TONE IN RIGHT ARM AND RIGHT LEG; RUE 4 PROX AND 2 DISTAL; RLE 2 PROX AND 1-2 DISTAL; LUE NORMAL; LLE 4+ SENSORY: DECR IN RIGHT HAND AND RIGHT FOOT TO ALL MODALITIES COORDINATION: BUE DYSMETRIA; SLOW FOOT TAP ON RIGHT REFLEXES: BUE 2; BLE KNEES 3, ANKLES 2 GAIT/STATION: SPASTIC, HEMIPARETIC GAIT WITH RIGHT LEG WEAKNESS/STIFFNESS; UNSTEADY; USING ROLLATOR.    DIAGNOSTIC DATA (LABS, IMAGING, TESTING) - I reviewed patient records, labs, notes, testing and imaging myself where available.  Lab Results  Component Value Date   WBC 7.2 05/13/2016   HGB 12.7 01/25/2015   HCT 39.0 05/13/2016   MCV 95 05/13/2016   PLT 206 05/13/2016      Component Value Date/Time    NA 139 05/13/2016 1533   K 4.2 05/13/2016 1533   CL 102 05/13/2016 1533   CO2 25 05/13/2016 1533   GLUCOSE 95 05/13/2016 1533   GLUCOSE 91 08/30/2014 1031   BUN 14 05/13/2016 1533   CREATININE 0.82 05/13/2016 1533   CALCIUM 8.9 05/13/2016 1533   PROT 6.8 05/13/2016 1533   ALBUMIN 3.9 05/13/2016 1533   AST 16 05/13/2016 1533   ALT 13 05/13/2016 1533   ALKPHOS 46 05/13/2016 1533   BILITOT 0.6 05/13/2016 1533   GFRNONAA 91 05/13/2016 1533   GFRAA 105 05/13/2016 1533   No results found for: CHOL No results found for: HGBA1C No results found for: VITAMINB12 No results found for: TSH  Vit D, 25-Hydroxy  Date Value Ref Range Status  10/22/2015 35.7 30.0 - 100.0 ng/mL Final    Comment:    Vitamin D deficiency has been defined by the Institute of Medicine and an Endocrine Society practice guideline as a level of serum 25-OH vitamin D less than 20 ng/mL (1,2). The Endocrine Society went on to further define vitamin D insufficiency as a level between 21 and 29 ng/mL (2). 1. IOM (Institute of Medicine). 2010. Dietary reference    intakes for calcium and D. [place]: The    [employer]. 2. Holick MF, [employer], Bischoff-Ferrari HA, et al.    Evaluation, treatment, and prevention of vitamin D    deficiency: an Endocrine Society clinical practice    guideline. JCEM. 2011 Jul; 96(7):1911-30.   05/01/2015 25.3 (L) 30.0 - 100.0 ng/mL Final    Comment:    Vitamin D deficiency has been defined by the Institute of Medicine and an Endocrine Society practice guideline as a level of serum 25-OH vitamin D less than 20 ng/mL (1,2). The Endocrine Society went on to further define vitamin D insufficiency as a level between 21 and 29 ng/mL (2). 1. IOM (Institute of Medicine). 2010. Dietary reference    intakes for calcium and D. [place]: The    [employer]. 2. Holick MF, Binkley [place], Bischoff-Ferrari HA, et al.    Evaluation, treatment, and prevention of  vitamin D    deficiency: an Endocrine Society clinical practice    guideline. JCEM. 2011 Jul; 96(7):1911-30.   10/31/2014 15.1 (L) 30.0 - 100.0 ng/mL Final    Comment:    Vitamin D deficiency has been defined by the Institute of  Medicine and an Endocrine Society practice guideline as a level of serum 25-OH vitamin D less than 20 ng/mL (1,2). The Endocrine Society went on to further define vitamin D insufficiency as a level between 21 and 29 ng/mL (2). 1. IOM (Institute of Medicine). 2010. Dietary reference    intakes for calcium and D. [place]: The    [employer]. 2. Holick MF, Binkley [place], Bischoff-Ferrari HA, et al.    Evaluation, treatment, and prevention of vitamin D    deficiency: an Endocrine Society clinical practice    guideline. JCEM. 2011 Jul; 96(7):1911-30.    Lymphocytes Absolute  Date Value Ref Range Status  05/13/2016 0.8 0.7 - 3.1 x10E3/uL Final  10/22/2015 2.0 0.7 - 3.1 x10E3/uL Final  05/01/2015 1.3 0.7 - 3.1 x10E3/uL Final    11/05/15 MRI cervical spine (with and without) [I reviewed images myself and agree with interpretation. -VRP]  1. Multiple (~7-8) chronic demyelinating plaques from cervico-medullary junction down to C7 level. 2. No acute plaques. 3. No change from MRI on 10/24/14.   11/05/15 MRI brain (with and without) [I reviewed images myself and agree with interpretation. -VRP]  1. Few periventricular and peri-callosal and juxtacortical and medullary chronic demyelinating plaques.  2. No abnormal lesions are seen on post contrast views.  3. No change from MRI on 08/30/14.   Labs - ANA, ANCA, ACE, HIV, RPR - all negative  11/01/14 anti-JCV ab - 1.31 (H) positive    ASSESSMENT AND PLAN  41 y.o. year old female here with multiple focal neurologic attacks since 2008. Neurologic exam demonstrates long tract signs affecting the right arm and right leg more than left side. MRI brain and cervical spine consistent with chronic  demyelinating disease. Other labs negative.   Now on tecfidera since Feb 2016. Had flare up symptoms in Nov 2016 and Nov-Dec 2017.  More muscle spasms (on baclofen).  Fatigue improved with medication regimen adjustment.   Bladder incontinence --> using adult pads stable.   Depression stable (seeing Dr. Inda Coke, [facility]).    Dx:  MS (multiple sclerosis) (HCC)  Right ear pain  Muscle spasm  Right optic neuritis  Other fatigue  Depression, unspecified depression type  Vitamin D deficiency    PLAN:  Muscle spasms - continue baclofen 20mg  three times per day for muscle spasms - add tizanidine 4mg  three times per day for muscle spasms  Right ear pain - add gabapentin 300mg  twice a day for right ear pain (? vestibulocochlear neuralgia) - follow up with ENT for right ear pain  Bladder incontinence - trial of oxybutynin 5mg  XL daily  Multiple sclerosis disease modifying therapy - continue tecfidera - continue multivitamin and vitamin D supplements - check MRI brain and labs  Orders Placed This Encounter  Procedures  . MR BRAIN W WO CONTRAST  . CBC with Differential/Platelet  . Comprehensive metabolic panel  . VITAMIN D 25 Hydroxy (Vit-D Deficiency, Fractures)   Meds ordered this encounter  Medications  . baclofen (LIORESAL) 10 MG tablet    Sig: Take 2 tablets (20 mg total) by mouth 3 (three) times daily.    Dispense:  180 tablet    Refill:  12  . tiZANidine (ZANAFLEX) 4 MG tablet    Sig: Take 1 tablet (4 mg total) by mouth every 6 (six) hours as needed for muscle spasms.    Dispense:  30 tablet    Refill:  0  . gabapentin (NEURONTIN) 300 MG capsule    Sig: Take  1 capsule (300 mg total) by mouth 3 (three) times daily.    Dispense:  90 capsule    Refill:  11  . oxybutynin (DITROPAN-XL) 5 MG 24 hr tablet    Sig: Take 1 tablet (5 mg total) by mouth at bedtime.    Dispense:  30 tablet    Refill:  6   Return in about 3 months (around  03/22/2017).  I reviewed images, labs, notes, records myself. I summarized findings and reviewed with patient, for this high risk condition (multiple sclerosis; possible exacerbation; bladder incontinence; severe right ear pain; severe muscle spasms; gait difficulty) requiring high complexity decision making.     Suanne Marker, MD 12/22/2016, 3:42 PM Certified in Neurology, Neurophysiology and Neuroimaging  [facility] [address] [place], [place] [address] [phone]

## 2016-12-22 NOTE — Patient Instructions (Addendum)
Muscle spasms - continue baclofen 20mg  three times per day for muscle spasms - add tizanidine 4mg  three times per day for muscle spasms (may start with 1 tab per day)  Right ear pain - add gabapentin 300mg  twice a day for right ear pain (may start with 1 tab per day) - follow up with ENT for right ear pain  Bladder incontinence - trial of oxybutynin 5mg  per day  Multiple sclerosis disease modifying therapy - continue tecfidera - continue multivitamin and vitamin D supplements - check MRI brain and labs

## 2016-12-23 LAB — COMPREHENSIVE METABOLIC PANEL
ALT: 8 IU/L (ref 0–32)
AST: 12 IU/L (ref 0–40)
Albumin/Globulin Ratio: 1.6 (ref 1.2–2.2)
Albumin: 4.2 g/dL (ref 3.5–5.5)
Alkaline Phosphatase: 38 IU/L — ABNORMAL LOW (ref 39–117)
BUN/Creatinine Ratio: 15 (ref 9–23)
BUN: 11 mg/dL (ref 6–20)
Bilirubin Total: 0.3 mg/dL (ref 0.0–1.2)
CALCIUM: 9.3 mg/dL (ref 8.7–10.2)
CHLORIDE: 102 mmol/L (ref 96–106)
CO2: 23 mmol/L (ref 18–29)
Creatinine, Ser: 0.75 mg/dL (ref 0.57–1.00)
GFR, EST AFRICAN AMERICAN: 116 mL/min/{1.73_m2} (ref >59)
GFR, EST NON AFRICAN AMERICAN: 101 mL/min/{1.73_m2} (ref >59)
GLUCOSE: 93 mg/dL (ref 65–99)
Globulin, Total: 2.6 g/dL (ref 1.5–4.5)
Potassium: 4.1 mmol/L (ref 3.5–5.2)
Sodium: 140 mmol/L (ref 134–144)
TOTAL PROTEIN: 6.8 g/dL (ref 6.0–8.5)

## 2016-12-23 LAB — CBC WITH DIFFERENTIAL/PLATELET
BASOS ABS: 0 10*3/uL (ref 0.0–0.2)
BASOS: 0 %
EOS (ABSOLUTE): 0.1 10*3/uL (ref 0.0–0.4)
Eos: 2 %
Hematocrit: 38.6 % (ref 34.0–46.6)
Hemoglobin: 12.8 g/dL (ref 11.1–15.9)
IMMATURE GRANS (ABS): 0 10*3/uL (ref 0.0–0.1)
IMMATURE GRANULOCYTES: 0 %
LYMPHS: 37 %
Lymphocytes Absolute: 1.6 10*3/uL (ref 0.7–3.1)
MCH: 32 pg (ref 26.6–33.0)
MCHC: 33.2 g/dL (ref 31.5–35.7)
MCV: 97 fL (ref 79–97)
Monocytes Absolute: 0.4 10*3/uL (ref 0.1–0.9)
Monocytes: 9 %
NEUTROS PCT: 52 %
Neutrophils Absolute: 2.3 10*3/uL (ref 1.4–7.0)
PLATELETS: 206 10*3/uL (ref 150–379)
RBC: 4 x10E6/uL (ref 3.77–5.28)
RDW: 13.1 % (ref 12.3–15.4)
WBC: 4.4 10*3/uL (ref 3.4–10.8)

## 2016-12-23 LAB — VITAMIN D 25 HYDROXY (VIT D DEFICIENCY, FRACTURES): Vit D, 25-Hydroxy: 34.1 ng/mL (ref 30.0–100.0)

## 2016-12-25 ENCOUNTER — Telehealth: Payer: Self-pay | Admitting: *Deleted

## 2016-12-25 NOTE — Telephone Encounter (Signed)
Per Dr Danae Orleans, spoke with patient and informed her that her lab results were unremarkable. She verbalized understanding, appreciation.

## 2017-01-04 ENCOUNTER — Ambulatory Visit
Admission: RE | Admit: 2017-01-04 | Discharge: 2017-01-04 | Disposition: A | Discharge status: Home / Self Care | Payer: Medicare Other | Source: Ambulatory Visit | Attending: Diagnostic Neuroimaging | Admitting: Diagnostic Neuroimaging

## 2017-01-04 DIAGNOSIS — F32A Depression, unspecified: Secondary | ICD-10-CM

## 2017-01-04 DIAGNOSIS — H469 Unspecified optic neuritis: Secondary | ICD-10-CM

## 2017-01-04 DIAGNOSIS — E559 Vitamin D deficiency, unspecified: Secondary | ICD-10-CM

## 2017-01-04 DIAGNOSIS — F329 Major depressive disorder, single episode, unspecified: Secondary | ICD-10-CM

## 2017-01-04 DIAGNOSIS — G35 Multiple sclerosis: Secondary | ICD-10-CM | POA: Diagnosis not present

## 2017-01-04 DIAGNOSIS — M62838 Other muscle spasm: Secondary | ICD-10-CM

## 2017-01-04 DIAGNOSIS — H9201 Otalgia, right ear: Secondary | ICD-10-CM

## 2017-01-04 DIAGNOSIS — R5383 Other fatigue: Secondary | ICD-10-CM

## 2017-01-04 MED ORDER — GADOBENATE DIMEGLUMINE 529 MG/ML IV SOLN
18.0000 mL | Freq: Once | INTRAVENOUS | Status: AC | PRN
  Administered 2017-01-04: 18 mL via INTRAVENOUS

## 2017-01-05 DIAGNOSIS — F419 Anxiety disorder, unspecified: Secondary | ICD-10-CM | POA: Diagnosis not present

## 2017-01-11 ENCOUNTER — Other Ambulatory Visit: Payer: Self-pay | Admitting: Diagnostic Neuroimaging

## 2017-01-12 MED ORDER — TIZANIDINE HCL 4 MG PO TABS: 4.0000 mg | ORAL_TABLET | Freq: Four times a day (QID) | ORAL | 3 refills | Status: DC | PRN

## 2017-01-18 ENCOUNTER — Telehealth: Payer: Self-pay | Admitting: Diagnostic Neuroimaging

## 2017-01-18 DIAGNOSIS — F411 Generalized anxiety disorder: Secondary | ICD-10-CM | POA: Diagnosis not present

## 2017-01-18 DIAGNOSIS — G35 Multiple sclerosis: Secondary | ICD-10-CM

## 2017-01-18 DIAGNOSIS — F329 Major depressive disorder, single episode, unspecified: Secondary | ICD-10-CM | POA: Diagnosis not present

## 2017-01-18 DIAGNOSIS — G35D Multiple sclerosis, unspecified: Secondary | ICD-10-CM

## 2017-01-18 MED ORDER — DIMETHYL FUMARATE 240 MG PO CPDR: 240.0000 mg | DELAYED_RELEASE_CAPSULE | Freq: Two times a day (BID) | ORAL | 4 refills | Status: DC

## 2017-01-18 NOTE — Telephone Encounter (Signed)
[facility] called said PA is needed for tecfidera to [facility] (p) [phone].

## 2017-01-18 NOTE — Telephone Encounter (Signed)
Phone number incorrect, not Conservation officer, nature. Sent refill electronically.

## 2017-01-18 NOTE — Telephone Encounter (Signed)
[facility] with [employer] has called stating patient is needing a refill for Tecfidera 240mg  filled thru [facility] P#- [phone].  Please call

## 2017-01-18 NOTE — Addendum Note (Signed)
Addended by: Maryland Pink on: 01/18/2017 11:58 AM   Modules accepted: Orders

## 2017-01-18 NOTE — Telephone Encounter (Signed)
Called [facility] to obtain information to begin PA on Tecfidera tomorrow.

## 2017-01-19 ENCOUNTER — Telehealth: Payer: Self-pay | Admitting: *Deleted

## 2017-01-19 ENCOUNTER — Encounter: Payer: Self-pay | Admitting: Diagnostic Neuroimaging

## 2017-01-19 NOTE — Telephone Encounter (Signed)
Received fax from [facility] for clarification re: Tecfidera Rx, is it for a starter pack. LVM for pharmacist with details requested and advised this is not for a starter pack of Tecfidera. It is Rx for continuation of therapy.

## 2017-01-20 ENCOUNTER — Telehealth: Payer: Self-pay | Admitting: *Deleted

## 2017-01-20 DIAGNOSIS — F419 Anxiety disorder, unspecified: Secondary | ICD-10-CM | POA: Diagnosis not present

## 2017-01-20 NOTE — Telephone Encounter (Signed)
Received e mail from [place] w/[employer]. Patient has been given a 14 day supply of Tecfidera.

## 2017-01-20 NOTE — Telephone Encounter (Signed)
Called [place] with [employer] to inform her patient sent my chart message re: will be out of Tecfidera today due to delay in shipping. [place] stated she would contact case manger to investigate. She advised patient call [facility] today to get temporary supply. She stated it may take 2 days to get medication to patient.  [place] will call back for update.   Attempted to reach pt on home phone, no answer, did not LVM.  Reached pt on mobile # and advised she call [facility] [phone] asap to get temporary supply of medication until issue is resolved. She verbalized understanding,  agreement, appreciation.

## 2017-01-21 ENCOUNTER — Telehealth: Payer: Self-pay | Admitting: *Deleted

## 2017-01-21 NOTE — Telephone Encounter (Signed)
PA approved through cover my meds

## 2017-01-21 NOTE — Telephone Encounter (Signed)
Spoke with patient and per Dr Marjory Lies, informed her that her MRI brain showed one new plaque, but it is not acute. Advised she may continue taking Tecfidera and consider ocrevus. Informed her Dr Marjory Lies can discuss with her when he returns to the office next week. She then requested her FU be moved sooner; rescheduled for 02/08/17. Then spoke with mother re: Tecfidera. She stated she spoke with [facility] [employer] yesterday. Patient will be supplied with 14 days Tecfidera to be delivered today. [facility]. [facility]. She was advised by [facility] if she has not heard from them when she is down to 5 day supply of med.  This RN advised she call with any problems, issues, questions.  Mother and patient verbalized understanding, appreciation.

## 2017-01-22 ENCOUNTER — Telehealth: Payer: Self-pay | Admitting: *Deleted

## 2017-01-22 DIAGNOSIS — G35 Multiple sclerosis: Secondary | ICD-10-CM

## 2017-01-22 MED ORDER — DIMETHYL FUMARATE 240 MG PO CPDR: 240.0000 mg | DELAYED_RELEASE_CAPSULE | Freq: Two times a day (BID) | ORAL | 4 refills | Status: DC

## 2017-01-22 NOTE — Telephone Encounter (Signed)
[facility] is now specialty pharmacy for Tecfidera; script to [facility] discontinued. New Rx to [facility] e scribed.

## 2017-01-26 NOTE — Telephone Encounter (Signed)
[facility] Tecfidera approved 12/07/16 thru 01/21/18

## 2017-01-27 DIAGNOSIS — H532 Diplopia: Secondary | ICD-10-CM | POA: Diagnosis not present

## 2017-01-27 DIAGNOSIS — H53483 Generalized contraction of visual field, bilateral: Secondary | ICD-10-CM | POA: Diagnosis not present

## 2017-01-27 DIAGNOSIS — H47293 Other optic atrophy, bilateral: Secondary | ICD-10-CM | POA: Diagnosis not present

## 2017-01-27 DIAGNOSIS — G35 Multiple sclerosis: Secondary | ICD-10-CM | POA: Diagnosis not present

## 2017-02-02 ENCOUNTER — Telehealth: Payer: Self-pay | Admitting: Diagnostic Neuroimaging

## 2017-02-02 MED ORDER — TIZANIDINE HCL 4 MG PO TABS: 4.0000 mg | ORAL_TABLET | Freq: Three times a day (TID) | ORAL | 6 refills | Status: DC

## 2017-02-02 NOTE — Telephone Encounter (Signed)
If helping, then change to three times per day scheduled dosing. Rx sent in. -VRP

## 2017-02-02 NOTE — Telephone Encounter (Signed)
Patient is calling to discuss medication tiZANidine (ZANAFLEX) 4 MG tablet. She take 1 every 6 hours and runs out of medication every 10 days. Can she get a larger quantity? Patient uses [facility] on [address].

## 2017-02-02 NOTE — Addendum Note (Signed)
Addended byJoycelyn Schmid on: 02/02/2017 04:38 PM   Modules accepted: Orders

## 2017-02-03 DIAGNOSIS — H5111 Convergence insufficiency: Secondary | ICD-10-CM | POA: Diagnosis not present

## 2017-02-03 DIAGNOSIS — G35 Multiple sclerosis: Secondary | ICD-10-CM | POA: Diagnosis not present

## 2017-02-03 DIAGNOSIS — H47293 Other optic atrophy, bilateral: Secondary | ICD-10-CM | POA: Diagnosis not present

## 2017-02-03 DIAGNOSIS — H40023 Open angle with borderline findings, high risk, bilateral: Secondary | ICD-10-CM | POA: Diagnosis not present

## 2017-02-03 MED ORDER — TIZANIDINE HCL 4 MG PO TABS: 4.0000 mg | ORAL_TABLET | Freq: Three times a day (TID) | ORAL | 6 refills | Status: DC

## 2017-02-03 NOTE — Telephone Encounter (Signed)
Reached patient on cell # and advised her Dr Marjory Lies is putting Tizanidine on regular schedule. Advised she is to take it every 8 hours. Advised her the new Rx has been sent in. She verbalized understanding appreciation.

## 2017-02-03 NOTE — Telephone Encounter (Signed)
Called home phone; rang continuously, no answer. Will call back later.

## 2017-02-03 NOTE — Addendum Note (Signed)
Addended by: Maryland Pink on: 02/03/2017 09:51 AM   Modules accepted: Orders

## 2017-02-08 ENCOUNTER — Encounter: Payer: Self-pay | Admitting: Diagnostic Neuroimaging

## 2017-02-08 ENCOUNTER — Ambulatory Visit (INDEPENDENT_AMBULATORY_CARE_PROVIDER_SITE_OTHER): Payer: Medicare Other | Admitting: Diagnostic Neuroimaging

## 2017-02-08 VITALS — BP 100/61 | HR 71 | Wt 194.8 lb

## 2017-02-08 DIAGNOSIS — G35 Multiple sclerosis: Secondary | ICD-10-CM | POA: Diagnosis not present

## 2017-02-08 DIAGNOSIS — R5383 Other fatigue: Secondary | ICD-10-CM | POA: Diagnosis not present

## 2017-02-08 DIAGNOSIS — G35D Multiple sclerosis, unspecified: Secondary | ICD-10-CM

## 2017-02-08 DIAGNOSIS — M79605 Pain in left leg: Secondary | ICD-10-CM | POA: Diagnosis not present

## 2017-02-08 DIAGNOSIS — M62838 Other muscle spasm: Secondary | ICD-10-CM | POA: Diagnosis not present

## 2017-02-08 MED ORDER — GABAPENTIN 300 MG PO CAPS: 300.0000 mg | ORAL_CAPSULE | Freq: Three times a day (TID) | ORAL | 11 refills | Status: DC

## 2017-02-08 MED ORDER — OXYBUTYNIN CHLORIDE ER 10 MG PO TB24: 10.0000 mg | ORAL_TABLET | Freq: Every day | ORAL | 12 refills | Status: DC

## 2017-02-08 MED ORDER — BACLOFEN 10 MG PO TABS: 20.0000 mg | ORAL_TABLET | Freq: Three times a day (TID) | ORAL | 12 refills | Status: DC

## 2017-02-08 MED ORDER — TIZANIDINE HCL 4 MG PO TABS: 4.0000 mg | ORAL_TABLET | Freq: Three times a day (TID) | ORAL | 6 refills | Status: DC

## 2017-02-08 NOTE — Patient Instructions (Signed)
-   increase gabapentin up to 600mg  three times per dau  - I will start process to switch to ocrevus

## 2017-02-08 NOTE — Progress Notes (Signed)
[facility]  PATIENT: Ebony Warren DOB: [birth year]  REFERRING CLINICIAN: Williams  HISTORY FROM: patient  REASON FOR VISIT: follow up   HISTORICAL  CHIEF COMPLAINT:  Chief Complaint  Patient presents with  . Multiple Sclerosis    rm 7, "no new issues; getting 2 pr of new glasses from [facility]"  . Follow-up    3 month, post MRI, discuss MS medicaitons    HISTORY OF PRESENT ILLNESS:   UPDATE 02/08/17: Since last visit, MRI brain shows 1 new lesion. Here to discuss ocrevus. Also with more left leg pain, muscle spasms, gait diff, fatigue. Right ear pain better. Bladder incont slightly better with oxybutinin initially, but not anymore.   UPDATE 12/22/16: Since last visit, was doing well until Nov 2017 and then developed stabbing right ear pain (continues to present). Also with new and increasing spasm and cramps in right hand and left leg since Dec 2017. Continues with fatigue and bladder incontinence.  UPDATE 05/13/16: Since last visit, doing well. No new MS events or symptoms. In retrospect, may have lost vision in right eye (2015, and possibly March 2016), lasting 3-7 days. Didn't mention to me until now. Recently this was noted optometry and ophthalmology exams.   UPDATE 01/27/16: Since last visit, fatigue improved with adjusting medication timing. Bladder issues stable. Had UTI at last visit, tx'd with abx, now better.  UPDATE 10/22/15: Since last visit was doing well. Then last week had new onset weakness in legs, bladder incontinence, strong smelling urine, and hand tremors. Now sxs almost fully resolved since yesterday.  UPDATE 05/01/15: Symptoms stable. Tolerating tecfidera.  UPDATE 01/25/15: Since last visit, had more problems for a few weeks (lower ext weakness, bladder incont), but now improved. Now on tecfidera x 3 weeks. Some itching and stomach issues with tecfidera, but mild. Main issues now consist of lower extremity pain, spasms,  urinary leakage, daytime fatigue.  UPDATE 10/31/14: Since last visit, sxs are stable. Test results and diagnosis reviewed. No new events. Using cane to walk.  UPDATE (10/02/14): 41 year old right-handed female here for evaluation of double vision. 2008 patient was 6 months pregnant with her son, when all of a sudden she didn't feel good. She noticed right hand clumsiness and incoordination. This lasted for approximately 9 months and then stopped. She did not seek medical attention for this problem. 2013 patient had onset of right arm weakness, right leg weakness, numbness on the right side, intermittent shaking sensation all over. Patient was evaluated in [place] and diagnosed with possible stroke. 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 Patient did not recover right arm or leg strength. Her speech was also affected. October 2014 patient moved to [facility]. 08/30/2014 patient was at home hanging clothes over the bathtub when she fell down. She is not sure what caused her fall. Her right leg may have given out. Patient fell and struck her head against the soap dish which broke off. Patient did not lose consciousness. Patient's family advised her to go to the emergency room. Patient was evaluated with CT and MRI of the brain. No acute findings were found. MRI of the brain did show multiple white matter lesions suspicious for chronic multiple sclerosis versus chronic small vessel ischemic disease. Advised to follow-up in outpatient basis. Since this fall patient has noticed some additional blurred vision, and double vision especially when looking to the left side.   REVIEW OF SYSTEMS: Full 14 system review of systems performed and negative except for: double vision flushing incont  bladder food allergies shortness of breath confusion depression anxiety memory loss numbness weakness hyperactive ringing in ears leg swelling.    ALLERGIES: Allergies  Allergen Reactions  . Morphine Itching  .  Orange Fruit [Citrus]     Makes her itchy and her face "feels like its on fire"  . Oxycodone Itching  . Percocet [Oxycodone-Acetaminophen]     Stomach cramps   . Vicodin [Hydrocodone-Acetaminophen]     Stomach cramps   . Other Rash    narcotics    HOME MEDICATIONS: Outpatient Medications Prior to Visit  Medication Sig Dispense Refill  . baclofen (LIORESAL) 10 MG tablet Take 2 tablets (20 mg total) by mouth 3 (three) times daily. 180 tablet 12  . cholecalciferol (VITAMIN D) 1000 UNITS tablet Take 1,000 Units by mouth daily. Take one a day for 6-12 months and we will recheck your levels when you come back in the office in 6 months    . Dimethyl Fumarate 240 MG CPDR Take 1 capsule (240 mg total) by mouth 2 (two) times daily. 180 capsule 4  . diphenhydrAMINE (BENADRYL) 25 mg capsule Take 25 mg by mouth every 6 (six) hours as needed. allergies    . escitalopram (LEXAPRO) 20 MG tablet Take 20 mg by mouth.    . gabapentin (NEURONTIN) 300 MG capsule Take 1 capsule (300 mg total) by mouth 3 (three) times daily. 90 capsule 11  . LORazepam (ATIVAN) 1 MG tablet Take 1 mg by mouth daily.    . Multiple Vitamins-Minerals (MULTIVITAMIN ADULT PO) Take 1 tablet by mouth daily.    . naproxen sodium (ANAPROX) 220 MG tablet Take 220 mg by mouth 2 (two) times daily with a meal.    . oxybutynin (DITROPAN-XL) 5 MG 24 hr tablet Take 1 tablet (5 mg total) by mouth at bedtime. 30 tablet 6  . tiZANidine (ZANAFLEX) 4 MG tablet Take 1 tablet (4 mg total) by mouth 3 (three) times daily. 90 tablet 6   No facility-administered medications prior to visit.     PAST MEDICAL HISTORY: Past Medical History:  Diagnosis Date  . Anxiety and depression   . Bradycardia   . Multiple sclerosis (HCC)   . Seizures (HCC)    told that they are not from the brain but stress related    PAST SURGICAL HISTORY: Past Surgical History:  Procedure Laterality Date  . CHOLECYSTECTOMY    . KNEE SURGERY      FAMILY  HISTORY: Family History  Problem Relation Age of Onset  . Hypertension Mother   . Atrial fibrillation Mother   . Hypertension Father   . Atrial fibrillation Maternal Grandmother     SOCIAL HISTORY:  Social History   Social History  . Marital status: Single    Spouse name: N/A  . Number of children: 1  . Years of education: College   Occupational History  .  Other    disabled   Social History Main Topics  . Smoking status: Former Smoker    Packs/day: 0.35    Years: 3.00    Types: Cigarettes    Quit date: 12/07/2006  . Smokeless tobacco: Never Used  . Alcohol use No     Comment: quit: 2013 (socially)  . Drug use: Yes    Types: Marijuana     Comment: quit: 2001   . Sexual activity: Not on file   Other Topics Concern  . Not on file   Social History Narrative   Patient lives at home with family.  Caffeine Use: drinks a cup of hot tea with tecfidera in the am   Patient is right handed.   Patient has a college education     PHYSICAL EXAM  Vitals:   02/08/17 0948  BP: 100/61  Pulse: 71  Weight: 194 lb 12.8 oz (88.4 kg)    Visual Acuity Screening   Right eye Left eye Both eyes  Without correction: 20/200 20/200   With correction:     Comments: 02/08/17 getting new glasses, 2 pr from [facility]    Body mass index is 30.51 kg/m.  GENERAL EXAM: Patient is in no distress; well developed, nourished and groomed; neck is supple  CARDIOVASCULAR: Regular rate and rhythm, no murmurs, no carotid bruits  NEUROLOGIC: MENTAL STATUS: awake, alert, language fluent, comprehension intact, naming intact, fund of knowledge appropriate; SLOW SCANNING SPEECH PATTERN CRANIAL NERVE: PUPILS PINPOINT AND REACTIVE; RIGHT EYE MORE SENSITIVE TO LIGHT; visual fields full to confrontation, extraocular muscles intact; facial sensation symmetric, face symm, hearing intact, palate elevates symmetrically, uvula midline, shoulder shrug symmetric, tongue midline. MOTOR: INCR TONE IN  RIGHT ARM AND RIGHT LEG; RUE 4 PROX AND 2 DISTAL; RLE 2-3 PROX AND 1-2 DISTAL; LUE NORMAL; LLE 4+ SENSORY: DECR IN RIGHT HAND AND RIGHT FOOT TO ALL MODALITIES COORDINATION: BUE DYSMETRIA; SLOW FOOT TAP ON RIGHT REFLEXES: BUE 2; BLE KNEES 3, ANKLES 2 GAIT/STATION: SPASTIC, HEMIPARETIC GAIT WITH RIGHT LEG WEAKNESS/STIFFNESS; UNSTEADY; USING ROLLATOR.    DIAGNOSTIC DATA (LABS, IMAGING, TESTING) - I reviewed patient records, labs, notes, testing and imaging myself where available.  Lab Results  Component Value Date   WBC 4.4 12/22/2016   HGB 12.7 01/25/2015   HCT 38.6 12/22/2016   MCV 97 12/22/2016   PLT 206 12/22/2016      Component Value Date/Time   NA 140 12/22/2016 1629   K 4.1 12/22/2016 1629   CL 102 12/22/2016 1629   CO2 23 12/22/2016 1629   GLUCOSE 93 12/22/2016 1629   GLUCOSE 91 08/30/2014 1031   BUN 11 12/22/2016 1629   CREATININE 0.75 12/22/2016 1629   CALCIUM 9.3 12/22/2016 1629   PROT 6.8 12/22/2016 1629   ALBUMIN 4.2 12/22/2016 1629   AST 12 12/22/2016 1629   ALT 8 12/22/2016 1629   ALKPHOS 38 (L) 12/22/2016 1629   BILITOT 0.3 12/22/2016 1629   GFRNONAA 101 12/22/2016 1629   GFRAA 116 12/22/2016 1629   No results found for: CHOL No results found for: HGBA1C No results found for: VITAMINB12 No results found for: TSH  Vit D, 25-Hydroxy  Date Value Ref Range Status  12/22/2016 34.1 30.0 - 100.0 ng/mL Final    Comment:    Vitamin D deficiency has been defined by the Institute of Medicine and an Endocrine Society practice guideline as a level of serum 25-OH vitamin D less than 20 ng/mL (1,2). The Endocrine Society went on to further define vitamin D insufficiency as a level between 21 and 29 ng/mL (2). 1. IOM (Institute of Medicine). 2010. Dietary reference    intakes for calcium and D. [place]: The    [employer]. 2. Holick MF, Binkley [place], Bischoff-Ferrari HA, et al.    Evaluation, treatment, and prevention of vitamin D    deficiency:  an Endocrine Society clinical practice    guideline. JCEM. 2011 Jul; 96(7):1911-30.   10/22/2015 35.7 30.0 - 100.0 ng/mL Final    Comment:    Vitamin D deficiency has been defined by the Institute of Medicine and an Endocrine  Society practice guideline as a level of serum 25-OH vitamin D less than 20 ng/mL (1,2). The Endocrine Society went on to further define vitamin D insufficiency as a level between 21 and 29 ng/mL (2). 1. IOM (Institute of Medicine). 2010. Dietary reference    intakes for calcium and D. [place]: The    [employer]. 2. Holick MF, Binkley [place], Bischoff-Ferrari HA, et al.    Evaluation, treatment, and prevention of vitamin D    deficiency: an Endocrine Society clinical practice    guideline. JCEM. 2011 Jul; 96(7):1911-30.   05/01/2015 25.3 (L) 30.0 - 100.0 ng/mL Final    Comment:    Vitamin D deficiency has been defined by the Institute of Medicine and an Endocrine Society practice guideline as a level of serum 25-OH vitamin D less than 20 ng/mL (1,2). The Endocrine Society went on to further define vitamin D insufficiency as a level between 21 and 29 ng/mL (2). 1. IOM (Institute of Medicine). 2010. Dietary reference    intakes for calcium and D. [place]: The    [employer]. 2. Holick MF, Binkley , Bischoff-Ferrari HA, et al.    Evaluation, treatment, and prevention of vitamin D    deficiency: an Endocrine Society clinical practice    guideline. JCEM. 2011 Jul; 96(7):1911-30.    Lymphocytes Absolute  Date Value Ref Range Status  12/22/2016 1.6 0.7 - 3.1 x10E3/uL Final  05/13/2016 0.8 0.7 - 3.1 x10E3/uL Final  10/22/2015 2.0 0.7 - 3.1 x10E3/uL Final    11/05/15 MRI cervical spine (with and without) [I reviewed images myself and agree with interpretation. -VRP]  1. Multiple (~7-8) chronic demyelinating plaques from cervico-medullary junction down to C7 level. 2. No acute plaques. 3. No change from MRI on 10/24/14.    11/05/15 MRI brain (with and without) [I reviewed images myself and agree with interpretation. -VRP]  1. Few periventricular and peri-callosal and juxtacortical and medullary chronic demyelinating plaques.  2. No abnormal lesions are seen on post contrast views.  3. No change from MRI on 08/30/14.   01/04/17 MRI brain (with and without) demonstrating: 1. There are a few periventricular, peri-callosal, juxtacortical chronic demyelinating plaques.  2. No acute plaques.  3. Compared to MRI on 11/05/15, there is 1 new left parietal juxtacortical lesion (series 8, image 37).  Labs - ANA, ANCA, ACE, HIV, RPR - all negative  11/01/14 anti-JCV ab - 1.31 (H) positive    ASSESSMENT AND PLAN  41 y.o. year old female here with multiple focal neurologic attacks since 2008. Neurologic exam demonstrates long tract signs affecting the right arm and right leg more than left side. 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 Other labs negative.   Now on tecfidera since Feb 2016. Had flare up symptoms in Nov 2016 and Nov-Dec 2017. Also new plaque in Jan 2018.   More muscle spasms and leg pain (on baclofen, tizanidine and gabapentin).  Fatigue improved with medication regimen adjustment.   Bladder incontinence --> using adult pads and oxybutinin   Depression stable (seeing Dr. Lilian Kapur, [facility]).    Dx:  Multiple sclerosis (HCC) - Plan: Hep B Surface Antibody, Hep B Surface Antigen, Hepatitis B Core AB, Total, Quantiferon tb gold assay, CBC with Differential/Platelet, Comprehensive metabolic panel  Muscle spasm - Plan: Hep B Surface Antibody, Hep B Surface Antigen, Hepatitis B Core AB, Total, Quantiferon tb gold assay, CBC with Differential/Platelet, Comprehensive metabolic panel  Other fatigue - Plan: Hep B Surface  Antibody, Hep B Surface Antigen, Hepatitis B Core AB, Total, Quantiferon tb gold assay, CBC with Differential/Platelet, Comprehensive  metabolic panel  Left leg pain - Plan: Hep B Surface Antibody, Hep B Surface Antigen, Hepatitis B Core AB, Total, Quantiferon tb gold assay, CBC with Differential/Platelet, Comprehensive metabolic panel    PLAN:  Multiple sclerosis disease modifying therapy - continue tecfidera --> plan to switch to ocrevus - check labs (hepatitis B panel and Tb testing) - continue multivitamin and vitamin D supplements  Muscle spasms - continue baclofen 20mg  three times per day for muscle spasms - add tizanidine 4mg  three times per day for muscle spasms  Left leg pain - increase gabapentin up to 600mg  three times per day  Right ear pain --> resolved  Bladder incontinence - increase oxybutynin to 10mg  XL daily  Depression - per Dr. Lilian Kapur [facility]) - continue lexapro  Orders Placed This Encounter  Procedures  . Hep B Surface Antibody  . Hep B Surface Antigen  . Hepatitis B Core AB, Total  . Quantiferon tb gold assay  . CBC with Differential/Platelet  . Comprehensive metabolic panel   Meds ordered this encounter  Medications  . baclofen (LIORESAL) 10 MG tablet    Sig: Take 2 tablets (20 mg total) by mouth 3 (three) times daily.    Dispense:  180 tablet    Refill:  12  . gabapentin (NEURONTIN) 300 MG capsule    Sig: Take 1 capsule (300 mg total) by mouth 3 (three) times daily.    Dispense:  90 capsule    Refill:  11  . oxybutynin (DITROPAN-XL) 10 MG 24 hr tablet    Sig: Take 1 tablet (10 mg total) by mouth at bedtime.    Dispense:  30 tablet    Refill:  12  . tiZANidine (ZANAFLEX) 4 MG tablet    Sig: Take 1 tablet (4 mg total) by mouth 3 (three) times daily.    Dispense:  90 tablet    Refill:  6   Return in about 3 months (around 05/11/2017).  I reviewed images, labs, notes, records myself. I summarized findings and reviewed with patient, for this high risk condition (multiple sclerosis; switching therapies; possible progression; bladder incontinence; severe muscle spasms;  gait difficulty) requiring high complexity decision making.     Suanne Marker, MD 02/08/2017, 10:04 AM Certified in Neurology, Neurophysiology and Neuroimaging  [facility] [address] [place], [place] [address] [phone]

## 2017-02-09 LAB — CBC WITH DIFFERENTIAL/PLATELET
BASOS: 0 %
Basophils Absolute: 0 10*3/uL (ref 0.0–0.2)
EOS (ABSOLUTE): 0.1 10*3/uL (ref 0.0–0.4)
Eos: 1 %
HEMATOCRIT: 39 % (ref 34.0–46.6)
HEMOGLOBIN: 13.1 g/dL (ref 11.1–15.9)
IMMATURE GRANS (ABS): 0 10*3/uL (ref 0.0–0.1)
Immature Granulocytes: 0 %
LYMPHS ABS: 1.7 10*3/uL (ref 0.7–3.1)
LYMPHS: 37 %
MCH: 32.3 pg (ref 26.6–33.0)
MCHC: 33.6 g/dL (ref 31.5–35.7)
MCV: 96 fL (ref 79–97)
MONOCYTES: 11 %
Monocytes Absolute: 0.5 10*3/uL (ref 0.1–0.9)
NEUTROS ABS: 2.3 10*3/uL (ref 1.4–7.0)
Neutrophils: 51 %
Platelets: 207 10*3/uL (ref 150–379)
RBC: 4.06 x10E6/uL (ref 3.77–5.28)
RDW: 12.9 % (ref 12.3–15.4)
WBC: 4.6 10*3/uL (ref 3.4–10.8)

## 2017-02-09 LAB — COMPREHENSIVE METABOLIC PANEL
A/G RATIO: 1.4 (ref 1.2–2.2)
ALBUMIN: 4 g/dL (ref 3.5–5.5)
ALT: 9 IU/L (ref 0–32)
AST: 12 IU/L (ref 0–40)
Alkaline Phosphatase: 41 IU/L (ref 39–117)
BILIRUBIN TOTAL: 0.5 mg/dL (ref 0.0–1.2)
BUN / CREAT RATIO: 19 (ref 9–23)
BUN: 13 mg/dL (ref 6–20)
CHLORIDE: 103 mmol/L (ref 96–106)
CO2: 25 mmol/L (ref 18–29)
Calcium: 9.3 mg/dL (ref 8.7–10.2)
Creatinine, Ser: 0.69 mg/dL (ref 0.57–1.00)
GFR calc non Af Amer: 110 mL/min/{1.73_m2} (ref >59)
GFR, EST AFRICAN AMERICAN: 127 mL/min/{1.73_m2} (ref >59)
GLOBULIN, TOTAL: 2.8 g/dL (ref 1.5–4.5)
Glucose: 76 mg/dL (ref 65–99)
POTASSIUM: 4.6 mmol/L (ref 3.5–5.2)
SODIUM: 140 mmol/L (ref 134–144)
TOTAL PROTEIN: 6.8 g/dL (ref 6.0–8.5)

## 2017-02-09 LAB — HEPATITIS B SURFACE ANTIGEN: Hepatitis B Surface Ag: NEGATIVE

## 2017-02-09 LAB — HEPATITIS B SURFACE ANTIBODY,QUALITATIVE: Hep B Surface Ab, Qual: NONREACTIVE

## 2017-02-09 LAB — HEPATITIS B CORE ANTIBODY, TOTAL: Hep B Core Total Ab: NEGATIVE

## 2017-02-11 DIAGNOSIS — F419 Anxiety disorder, unspecified: Secondary | ICD-10-CM | POA: Diagnosis not present

## 2017-02-11 LAB — QUANTIFERON IN TUBE
QUANTIFERON MITOGEN VALUE: 7.49 IU/mL
QUANTIFERON TB AG VALUE: 0.12 IU/mL
QUANTIFERON TB GOLD: NEGATIVE
Quantiferon Nil Value: 0.16 IU/mL

## 2017-02-11 LAB — QUANTIFERON TB GOLD ASSAY (BLOOD)

## 2017-02-15 ENCOUNTER — Telehealth: Payer: Self-pay | Admitting: Diagnostic Neuroimaging

## 2017-02-15 MED ORDER — GABAPENTIN 300 MG PO CAPS: 300.0000 mg | ORAL_CAPSULE | Freq: Three times a day (TID) | ORAL | 11 refills | Status: DC

## 2017-02-15 NOTE — Telephone Encounter (Signed)
Pt called said Dr Demetrius Charity said she could take gabapentin (NEURONTIN) 300 MG capsule1-2 tabs 3 x day but directions on the bottle read 1 tab 3 x day. Says she will run out since she is taking 2 capsules 3 x day. Please send RX to [facility]. Pt is aware the clinic closes at noon today due to the weather.

## 2017-02-15 NOTE — Telephone Encounter (Signed)
rx sent in. -VRP 

## 2017-02-17 ENCOUNTER — Encounter: Payer: Self-pay | Admitting: Diagnostic Neuroimaging

## 2017-02-18 ENCOUNTER — Telehealth: Payer: Self-pay | Admitting: *Deleted

## 2017-02-18 NOTE — Telephone Encounter (Signed)
Per Dr Marjory Lies, spoke with patient and informed her that her lab results are unremarkable. Dr Marjory Lies will proceed with switch to ocrevus. Advised her that infusion RN has received Ocrevus paperwork and has started to process for approval. Advised she will get a call when it is ready to be scheduled. She verbalized understanding, appreciation.Marland Kitchen

## 2017-02-22 DIAGNOSIS — G35 Multiple sclerosis: Secondary | ICD-10-CM | POA: Diagnosis not present

## 2017-02-22 DIAGNOSIS — Z79899 Other long term (current) drug therapy: Secondary | ICD-10-CM | POA: Diagnosis not present

## 2017-02-25 DIAGNOSIS — F419 Anxiety disorder, unspecified: Secondary | ICD-10-CM | POA: Diagnosis not present

## 2017-03-11 ENCOUNTER — Encounter: Payer: Self-pay | Admitting: Diagnostic Neuroimaging

## 2017-03-15 ENCOUNTER — Encounter: Payer: Self-pay | Admitting: Diagnostic Neuroimaging

## 2017-03-16 ENCOUNTER — Encounter: Payer: Self-pay | Admitting: Diagnostic Neuroimaging

## 2017-03-16 DIAGNOSIS — F419 Anxiety disorder, unspecified: Secondary | ICD-10-CM | POA: Diagnosis not present

## 2017-03-16 NOTE — Telephone Encounter (Signed)
Gave information to tina with [facility].  They need for pt to call and speak to [facility] relating to her birth date.  (apparently discrepancy relating to what they have listed as her dob).  LMVM mobile for pt to call MCR.

## 2017-03-16 NOTE — Telephone Encounter (Signed)
Spoke to [place], in [place], they needed clarification on pts dob and MCR.  Spoke to pt.  Verified her MCR and Medicaid # and birth date.  She stated that her R shoulder twitching has stopped since yesterday.  (this had been ongoing (constant for 3 wks.).  Relayed to [place] in [place].

## 2017-03-22 ENCOUNTER — Ambulatory Visit: Payer: Self-pay | Admitting: Diagnostic Neuroimaging

## 2017-03-24 ENCOUNTER — Telehealth: Payer: Self-pay | Admitting: *Deleted

## 2017-03-24 NOTE — Telephone Encounter (Signed)
Received fax from [facility] that Ocrevus not convered on her plan.  Pick another therapy.

## 2017-04-02 NOTE — Telephone Encounter (Signed)
I have given information to [facility], Inetta Fermo.  She will follow up on the [facility] side.

## 2017-04-13 DIAGNOSIS — F329 Major depressive disorder, single episode, unspecified: Secondary | ICD-10-CM | POA: Diagnosis not present

## 2017-04-13 DIAGNOSIS — F419 Anxiety disorder, unspecified: Secondary | ICD-10-CM | POA: Diagnosis not present

## 2017-04-19 DIAGNOSIS — F419 Anxiety disorder, unspecified: Secondary | ICD-10-CM | POA: Diagnosis not present

## 2017-04-23 DIAGNOSIS — H47213 Primary optic atrophy, bilateral: Secondary | ICD-10-CM | POA: Diagnosis not present

## 2017-04-23 DIAGNOSIS — H53483 Generalized contraction of visual field, bilateral: Secondary | ICD-10-CM | POA: Diagnosis not present

## 2017-04-26 DIAGNOSIS — G35 Multiple sclerosis: Secondary | ICD-10-CM | POA: Diagnosis not present

## 2017-04-27 DIAGNOSIS — H53483 Generalized contraction of visual field, bilateral: Secondary | ICD-10-CM | POA: Diagnosis not present

## 2017-04-27 DIAGNOSIS — H47213 Primary optic atrophy, bilateral: Secondary | ICD-10-CM | POA: Diagnosis not present

## 2017-05-05 DIAGNOSIS — H53483 Generalized contraction of visual field, bilateral: Secondary | ICD-10-CM | POA: Diagnosis not present

## 2017-05-05 DIAGNOSIS — H47213 Primary optic atrophy, bilateral: Secondary | ICD-10-CM | POA: Diagnosis not present

## 2017-05-10 DIAGNOSIS — G35 Multiple sclerosis: Secondary | ICD-10-CM | POA: Diagnosis not present

## 2017-05-17 DIAGNOSIS — F419 Anxiety disorder, unspecified: Secondary | ICD-10-CM | POA: Diagnosis not present

## 2017-05-19 ENCOUNTER — Ambulatory Visit (INDEPENDENT_AMBULATORY_CARE_PROVIDER_SITE_OTHER): Payer: Medicare Other | Admitting: Diagnostic Neuroimaging

## 2017-05-19 ENCOUNTER — Encounter: Payer: Self-pay | Admitting: Diagnostic Neuroimaging

## 2017-05-19 VITALS — BP 95/61 | HR 67 | Wt 196.0 lb

## 2017-05-19 DIAGNOSIS — M62838 Other muscle spasm: Secondary | ICD-10-CM

## 2017-05-19 DIAGNOSIS — R5383 Other fatigue: Secondary | ICD-10-CM | POA: Diagnosis not present

## 2017-05-19 DIAGNOSIS — G35 Multiple sclerosis: Secondary | ICD-10-CM | POA: Diagnosis not present

## 2017-05-19 DIAGNOSIS — F329 Major depressive disorder, single episode, unspecified: Secondary | ICD-10-CM | POA: Diagnosis not present

## 2017-05-19 DIAGNOSIS — F32A Depression, unspecified: Secondary | ICD-10-CM

## 2017-05-19 MED ORDER — GABAPENTIN 300 MG PO CAPS: 300.0000 mg | ORAL_CAPSULE | Freq: Three times a day (TID) | ORAL | 11 refills | Status: DC

## 2017-05-19 MED ORDER — TIZANIDINE HCL 4 MG PO TABS: 4.0000 mg | ORAL_TABLET | Freq: Three times a day (TID) | ORAL | 6 refills | Status: DC

## 2017-05-19 MED ORDER — OXYBUTYNIN CHLORIDE ER 10 MG PO TB24: 10.0000 mg | ORAL_TABLET | Freq: Every day | ORAL | 12 refills | Status: DC

## 2017-05-19 MED ORDER — BACLOFEN 10 MG PO TABS: 20.0000 mg | ORAL_TABLET | Freq: Three times a day (TID) | ORAL | 12 refills | Status: DC

## 2017-05-19 NOTE — Progress Notes (Signed)
[facility]  PATIENT: Ebony Warren DOB: Jan 04, 1976  REFERRING CLINICIAN: Williams  HISTORY FROM: patient  REASON FOR VISIT: follow up   HISTORICAL  CHIEF COMPLAINT:  Chief Complaint  Patient presents with  . Multiple Sclerosis    rm 6, "no new concerns; continued leg and hand pain, this is not new; Ocrevus infusions going well"  . Follow-up    3 month    HISTORY OF PRESENT ILLNESS:   UPDATE 05/19/17: Since last visit, now transitioned to ocrevus (May 21, June 4). Did well with infusion. Spasms stable. Left leg slightly better. Bladder slightly better. Depression stable. Using rollator walker. No other triggering factors. Overall doing well.   UPDATE 02/08/17: Since last visit, MRI brain shows 1 new lesion. Here to discuss ocrevus. Also with more left leg pain, muscle spasms, gait diff, fatigue. Right ear pain better. Bladder incont slightly better with oxybutinin initially, but not anymore.   UPDATE 12/22/16: Since last visit, was doing well until Nov 2017 and then developed stabbing right ear pain (continues to present). Also with new and increasing spasm and cramps in right hand and left leg since Dec 2017. Continues with fatigue and bladder incontinence.  UPDATE 05/13/16: Since last visit, doing well. No new MS events or symptoms. In retrospect, may have lost vision in right eye (2015, and possibly March 2016), lasting 3-7 days. Didn't mention to me until now. Recently this was noted optometry and ophthalmology exams.   UPDATE 01/27/16: Since last visit, fatigue improved with adjusting medication timing. Bladder issues stable. Had UTI at last visit, tx'd with abx, now better.  UPDATE 10/22/15: Since last visit was doing well. Then last week had new onset weakness in legs, bladder incontinence, strong smelling urine, and hand tremors. Now sxs almost fully resolved since yesterday.  UPDATE 05/01/15: Symptoms stable. Tolerating tecfidera.  UPDATE  01/25/15: Since last visit, had more problems for a few weeks (lower ext weakness, bladder incont), but now improved. Now on tecfidera x 3 weeks. Some itching and stomach issues with tecfidera, but mild. Main issues now consist of lower extremity pain, spasms, urinary leakage, daytime fatigue.  UPDATE 10/31/14: Since last visit, sxs are stable. Test results and diagnosis reviewed. No new events. Using cane to walk.  UPDATE (10/02/14): 41 year old right-handed female here for evaluation of double vision. 2008 patient was 6 months pregnant with her son, when all of a sudden she didn't feel good. She noticed right hand clumsiness and incoordination. This lasted for approximately 9 months and then stopped. She did not seek medical attention for this problem. 2013 patient had onset of right arm weakness, right leg weakness, numbness on the right side, intermittent shaking sensation all over. Patient was evaluated in [place] and diagnosed with possible stroke. 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 Patient did not recover right arm or leg strength. Her speech was also affected. October 2014 patient moved to [facility]. 08/30/2014 patient was at home hanging clothes over the bathtub when she fell down. She is not sure what caused her fall. Her right leg may have given out. Patient fell and struck her head against the soap dish which broke off. Patient did not lose consciousness. Patient's family advised her to go to the emergency room. Patient was evaluated with CT and MRI of the brain. No acute findings were found. MRI of the brain did show multiple white matter lesions suspicious for chronic multiple sclerosis versus chronic small vessel ischemic disease. Advised to follow-up in outpatient basis. Since this  fall patient has noticed some additional blurred vision, and double vision especially when looking to the left side.   REVIEW OF SYSTEMS: Full 14 system review of systems performed and negative except  for: memory loss dizziness confusion anxiety flushing.     ALLERGIES: Allergies  Allergen Reactions  . Morphine Itching  . Orange Fruit [Citrus]     Acidic foods Makes her itchy and her face "feels like its on fire"  . Oxycodone Itching  . Percocet [Oxycodone-Acetaminophen]     Stomach cramps   . Vicodin [Hydrocodone-Acetaminophen]     Stomach cramps   . Other Rash    narcotics    HOME MEDICATIONS: Outpatient Medications Prior to Visit  Medication Sig Dispense Refill  . baclofen (LIORESAL) 10 MG tablet Take 2 tablets (20 mg total) by mouth 3 (three) times daily. 180 tablet 12  . cholecalciferol (VITAMIN D) 1000 UNITS tablet Take 1,000 Units by mouth daily. Take one a day for 6-12 months and we will recheck your levels when you come back in the office in 6 months    . diphenhydrAMINE (BENADRYL) 25 mg capsule Take 25 mg by mouth every 6 (six) hours as needed. allergies    . escitalopram (LEXAPRO) 20 MG tablet Take 20 mg by mouth.    . gabapentin (NEURONTIN) 300 MG capsule Take 1-2 capsules (300-600 mg total) by mouth 3 (three) times daily. 180 capsule 11  . LORazepam (ATIVAN) 1 MG tablet Take 1 mg by mouth daily.    . Multiple Vitamins-Minerals (MULTIVITAMIN ADULT PO) Take 1 tablet by mouth daily.    . naproxen sodium (ANAPROX) 220 MG tablet Take 220 mg by mouth 2 (two) times daily with a meal.    . oxybutynin (DITROPAN-XL) 10 MG 24 hr tablet Take 1 tablet (10 mg total) by mouth at bedtime. 30 tablet 12  . tiZANidine (ZANAFLEX) 4 MG tablet Take 1 tablet (4 mg total) by mouth 3 (three) times daily. 90 tablet 6  . Dimethyl Fumarate 240 MG CPDR Take 1 capsule (240 mg total) by mouth 2 (two) times daily. 180 capsule 4   No facility-administered medications prior to visit.     PAST MEDICAL HISTORY: Past Medical History:  Diagnosis Date  . Anxiety and depression   . Bradycardia   . Multiple sclerosis (HCC)   . Seizures (HCC)    told that they are not from the brain but stress  related    PAST SURGICAL HISTORY: Past Surgical History:  Procedure Laterality Date  . CHOLECYSTECTOMY    . KNEE SURGERY      FAMILY HISTORY: Family History  Problem Relation Age of Onset  . Hypertension Mother   . Atrial fibrillation Mother   . Hypertension Father   . Atrial fibrillation Maternal Grandmother     SOCIAL HISTORY:  Social History   Social History  . Marital status: Single    Spouse name: N/A  . Number of children: 1  . Years of education: College   Occupational History  .  Other    disabled   Social History Main Topics  . Smoking status: Former Smoker    Packs/day: 0.35    Years: 3.00    Types: Cigarettes    Quit date: 12/07/2006  . Smokeless tobacco: Never Used  . Alcohol use No     Comment: quit: 2013 (socially)  . Drug use: Yes    Types: Marijuana     Comment: quit: 2001   . Sexual activity: Not on  file   Other Topics Concern  . Not on file   Social History Narrative   Patient lives at home with family.   Caffeine Use: drinks a cup of hot tea with tecfidera in the am   Patient is right handed.   Patient has a college education     PHYSICAL EXAM  Vitals:   05/19/17 0848  BP: 95/61  Pulse: 67  Weight: 196 lb (88.9 kg)    Visual Acuity Screening   Right eye Left eye Both eyes  Without correction:     With correction: 20/50 20/50      Body mass index is 30.7 kg/m.  GENERAL EXAM: Patient is in no distress; well developed, nourished and groomed; neck is supple  CARDIOVASCULAR: Regular rate and rhythm, no murmurs, no carotid bruits  NEUROLOGIC: MENTAL STATUS: awake, alert, language fluent, comprehension intact, naming intact, fund of knowledge appropriate; SLOW SCANNING SPEECH PATTERN CRANIAL NERVE: PUPILS PINPOINT AND REACTIVE; visual fields full to confrontation, extraocular muscles intact; MILD BILATERAL PTOSIS; facial sensation symmetric, face symm, hearing intact, palate elevates symmetrically, uvula midline, shoulder  shrug symmetric, tongue midline. MOTOR: INCR TONE IN RIGHT ARM AND RIGHT LEG; RUE 4 PROX AND 3 DISTAL; RLE 3 PROX AND 2-3 DISTAL; LUE NORMAL; LLE 4+ SENSORY: DECR IN RIGHT HAND AND RIGHT FOOT TO ALL MODALITIES COORDINATION: BUE DYSMETRIA; SLOW FINGER AND FOOT TAP ON RIGHT REFLEXES: BUE 2; BLE KNEES 3, ANKLES 2 GAIT/STATION: SPASTIC, HEMIPARETIC GAIT WITH RIGHT LEG WEAKNESS/STIFFNESS; UNSTEADY; USING ROLLATOR.    DIAGNOSTIC DATA (LABS, IMAGING, TESTING) - I reviewed patient records, labs, notes, testing and imaging myself where available.  Lab Results  Component Value Date   WBC 4.6 02/08/2017   HGB 13.1 02/08/2017   HCT 39.0 02/08/2017   MCV 96 02/08/2017   PLT 207 02/08/2017      Component Value Date/Time   NA 140 02/08/2017 1106   K 4.6 02/08/2017 1106   CL 103 02/08/2017 1106   CO2 25 02/08/2017 1106   GLUCOSE 76 02/08/2017 1106   GLUCOSE 91 08/30/2014 1031   BUN 13 02/08/2017 1106   CREATININE 0.69 02/08/2017 1106   CALCIUM 9.3 02/08/2017 1106   PROT 6.8 02/08/2017 1106   ALBUMIN 4.0 02/08/2017 1106   AST 12 02/08/2017 1106   ALT 9 02/08/2017 1106   ALKPHOS 41 02/08/2017 1106   BILITOT 0.5 02/08/2017 1106   GFRNONAA 110 02/08/2017 1106   GFRAA 127 02/08/2017 1106   No results found for: CHOL No results found for: HGBA1C No results found for: VITAMINB12 No results found for: TSH  Vit D, 25-Hydroxy  Date Value Ref Range Status  12/22/2016 34.1 30.0 - 100.0 ng/mL Final    Comment:    Vitamin D deficiency has been defined by the Institute of Medicine and an Endocrine Society practice guideline as a level of serum 25-OH vitamin D less than 20 ng/mL (1,2). The Endocrine Society went on to further define vitamin D insufficiency as a level between 21 and 29 ng/mL (2). 1. IOM (Institute of Medicine). 2010. Dietary reference    intakes for calcium and D. [place]: The    [employer]. 2. Holick MF, Binkley Jerome, Bischoff-Ferrari HA, et al.     Evaluation, treatment, and prevention of vitamin D    deficiency: an Endocrine Society clinical practice    guideline. JCEM. 2011 Jul; 96(7):1911-30.   10/22/2015 35.7 30.0 - 100.0 ng/mL Final    Comment:    Vitamin D deficiency  has been defined by the Institute of Medicine and an Endocrine Society practice guideline as a level of serum 25-OH vitamin D less than 20 ng/mL (1,2). The Endocrine Society went on to further define vitamin D insufficiency as a level between 21 and 29 ng/mL (2). 1. IOM (Institute of Medicine). 2010. Dietary reference    intakes for calcium and D. [place]: The    [employer]. 2. Holick MF, Binkley [place], Bischoff-Ferrari HA, et al.    Evaluation, treatment, and prevention of vitamin D    deficiency: an Endocrine Society clinical practice    guideline. JCEM. 2011 Jul; 96(7):1911-30.   05/01/2015 25.3 (L) 30.0 - 100.0 ng/mL Final    Comment:    Vitamin D deficiency has been defined by the Institute of Medicine and an Endocrine Society practice guideline as a level of serum 25-OH vitamin D less than 20 ng/mL (1,2). The Endocrine Society went on to further define vitamin D insufficiency as a level between 21 and 29 ng/mL (2). 1. IOM (Institute of Medicine). 2010. Dietary reference    intakes for calcium and D. [place]: The    [employer]. 2. Holick MF, Binkley [place], Bischoff-Ferrari HA, et al.    Evaluation, treatment, and prevention of vitamin D    deficiency: an Endocrine Society clinical practice    guideline. JCEM. 2011 Jul; 96(7):1911-30.    Lymphocytes Absolute  Date Value Ref Range Status  02/08/2017 1.7 0.7 - 3.1 x10E3/uL Final  12/22/2016 1.6 0.7 - 3.1 x10E3/uL Final  05/13/2016 0.8 0.7 - 3.1 x10E3/uL Final    11/05/15 MRI cervical spine (with and without) [I reviewed images myself and agree with interpretation. -VRP]  1. Multiple (~7-8) chronic demyelinating plaques from cervico-medullary junction down to C7  level. 2. No acute plaques. 3. No change from MRI on 10/24/14.   11/05/15 MRI brain (with and without) [I reviewed images myself and agree with interpretation. -VRP]  1. Few periventricular and peri-callosal and juxtacortical and medullary chronic demyelinating plaques.  2. No abnormal lesions are seen on post contrast views.  3. No change from MRI on 08/30/14.   01/04/17 MRI brain (with and without) demonstrating: 1. There are a few periventricular, peri-callosal, juxtacortical chronic demyelinating plaques.  2. No acute plaques.  3. Compared to MRI on 11/05/15, there is 1 new left parietal juxtacortical lesion (series 8, image 37).  Labs - ANA, ANCA, ACE, HIV, RPR - all negative  11/01/14 anti-JCV ab - 1.31 (H) positive    ASSESSMENT AND PLAN  41 y.o. year old female here with multiple focal neurologic attacks since 2008. Neurologic exam demonstrates long tract signs affecting the right arm and right leg more than left side. 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 Other labs negative.   On tecfidera since Feb 2016. Had flare up symptoms in Nov 2016 and Nov-Dec 2017. Also new plaque in Jan 2018. Now on ocrevus since May/June 2018.   Muscle spasms and leg pain (on baclofen, tizanidine and gabapentin).  Fatigue improved with medication regimen adjustment.   Bladder incontinence --> using adult pads and oxybutinin   Depression stable (seeing Dr. Lilian Kapur, [facility]).    Dx:  Multiple sclerosis (HCC)  Muscle spasm  Other fatigue  Depression, unspecified depression type    PLAN:  Multiple sclerosis disease modifying therapy (established problem, stable) - continue ocrevus; check CBC, CMP every 6 months - continue multivitamin and vitamin D supplements  Muscle spasms (established problem, stable) -  continue baclofen 20mg  three times per day for muscle spasms - continue tizanidine 4mg  three times per day for muscle  spasms  Left leg pain (established problem, stable) - continue gabapentin up to 600mg  three times per day  Bladder incontinence (established problem, improved) - continue oxybutynin 10mg  XL daily  Depression (established problem, worsening) - per Dr. Lilian Kapur [facility]) - continue lexapro  No orders of the defined types were placed in this encounter.  Meds ordered this encounter  Medications  . tiZANidine (ZANAFLEX) 4 MG tablet    Sig: Take 1 tablet (4 mg total) by mouth 3 (three) times daily.    Dispense:  90 tablet    Refill:  6  . gabapentin (NEURONTIN) 300 MG capsule    Sig: Take 1-2 capsules (300-600 mg total) by mouth 3 (three) times daily.    Dispense:  180 capsule    Refill:  11  . oxybutynin (DITROPAN-XL) 10 MG 24 hr tablet    Sig: Take 1 tablet (10 mg total) by mouth at bedtime.    Dispense:  30 tablet    Refill:  12  . baclofen (LIORESAL) 10 MG tablet    Sig: Take 2 tablets (20 mg total) by mouth 3 (three) times daily.    Dispense:  180 tablet    Refill:  12   Return in about 5 months (around 10/19/2017).    Suanne Marker, MD 05/19/2017, 9:27 AM Certified in Neurology, Neurophysiology and Neuroimaging  [facility] [address] [place], [place] [address] [phone]

## 2017-05-24 ENCOUNTER — Ambulatory Visit: Payer: Self-pay | Admitting: Diagnostic Neuroimaging

## 2017-05-31 DIAGNOSIS — R278 Other lack of coordination: Secondary | ICD-10-CM | POA: Diagnosis not present

## 2017-05-31 DIAGNOSIS — G35 Multiple sclerosis: Secondary | ICD-10-CM | POA: Diagnosis not present

## 2017-05-31 DIAGNOSIS — Z79899 Other long term (current) drug therapy: Secondary | ICD-10-CM | POA: Diagnosis not present

## 2017-05-31 DIAGNOSIS — B360 Pityriasis versicolor: Secondary | ICD-10-CM | POA: Diagnosis not present

## 2017-05-31 DIAGNOSIS — G3184 Mild cognitive impairment, so stated: Secondary | ICD-10-CM | POA: Diagnosis not present

## 2017-06-07 DIAGNOSIS — F419 Anxiety disorder, unspecified: Secondary | ICD-10-CM | POA: Diagnosis not present

## 2017-06-23 ENCOUNTER — Telehealth: Payer: Self-pay | Admitting: Diagnostic Neuroimaging

## 2017-06-23 DIAGNOSIS — H53483 Generalized contraction of visual field, bilateral: Secondary | ICD-10-CM | POA: Diagnosis not present

## 2017-06-23 DIAGNOSIS — G35 Multiple sclerosis: Secondary | ICD-10-CM

## 2017-06-23 DIAGNOSIS — H47213 Primary optic atrophy, bilateral: Secondary | ICD-10-CM | POA: Diagnosis not present

## 2017-06-23 NOTE — Telephone Encounter (Signed)
Renee (Acupuncturist) with [facility] called office in reference to seeing patient.  Renee states patient has been having increased falls, becoming weaker, and decreased hand strength and grip.  She would like to know if patient is able to receive home health services along with PT.  Feels patient would benefit from a nurse also to help manage patients medications.  Please call

## 2017-06-23 NOTE — Telephone Encounter (Signed)
Spoke to pt and she stated she thought more generalized weakness, no exacerbation.  She has had some falls, nothing major, (bruises, cuts).  Decrease R hand grip, decreased balance.  L leg pain, and R foot drop which she has had.  She is thinking PT would help, also request for nursing to help manage medications.  Ok to order?

## 2017-06-23 NOTE — Telephone Encounter (Signed)
Ok to setup home services with PT. -VRP

## 2017-06-24 NOTE — Addendum Note (Signed)
Addended by: Hermenia Fiscal S on: 06/24/2017 10:08 AM   Modules accepted: Orders

## 2017-06-25 DIAGNOSIS — G8191 Hemiplegia, unspecified affecting right dominant side: Secondary | ICD-10-CM | POA: Diagnosis not present

## 2017-06-25 DIAGNOSIS — G35 Multiple sclerosis: Secondary | ICD-10-CM | POA: Diagnosis not present

## 2017-06-28 ENCOUNTER — Telehealth: Payer: Self-pay | Admitting: Diagnostic Neuroimaging

## 2017-06-28 DIAGNOSIS — G35 Multiple sclerosis: Secondary | ICD-10-CM

## 2017-06-28 DIAGNOSIS — G8191 Hemiplegia, unspecified affecting right dominant side: Secondary | ICD-10-CM | POA: Diagnosis not present

## 2017-06-28 DIAGNOSIS — F419 Anxiety disorder, unspecified: Secondary | ICD-10-CM | POA: Diagnosis not present

## 2017-06-28 NOTE — Telephone Encounter (Signed)
Ebony Warren is calling to get verbal orders for OT 2x a week for 1 week and 1x a week for 2 weeks fior ADL. IADL, transfer exercise and OK to discharge when goals are met for maximum potential. Needs order faxed to DME provider of our choice for patient to have a tub transfer bench.  Please fax face sheet along with Medicaid# to that DME provider.

## 2017-06-29 DIAGNOSIS — G35 Multiple sclerosis: Secondary | ICD-10-CM | POA: Diagnosis not present

## 2017-06-29 DIAGNOSIS — G8191 Hemiplegia, unspecified affecting right dominant side: Secondary | ICD-10-CM | POA: Diagnosis not present

## 2017-06-29 NOTE — Addendum Note (Signed)
Addended by: Guy Begin on: 06/29/2017 08:52 AM   Modules accepted: Orders

## 2017-06-29 NOTE — Telephone Encounter (Addendum)
LMVM for Roe Coombs, with [facility] OT.  Ok for OT per requested.  Will place order for Tub transfer bench with [facility].  Done.

## 2017-07-01 DIAGNOSIS — G35 Multiple sclerosis: Secondary | ICD-10-CM | POA: Diagnosis not present

## 2017-07-01 DIAGNOSIS — G8191 Hemiplegia, unspecified affecting right dominant side: Secondary | ICD-10-CM | POA: Diagnosis not present

## 2017-07-01 NOTE — Addendum Note (Signed)
Addended by: Hermenia Fiscal S on: 07/01/2017 01:06 PM   Modules accepted: Orders

## 2017-07-01 NOTE — Addendum Note (Signed)
Addended by: Guy Begin on: 07/01/2017 01:04 PM   Modules accepted: Orders

## 2017-07-01 NOTE — Telephone Encounter (Signed)
Spoke with pt and let her know that order placed and wanted to check with her about using [facility]?  She stated that did not want to use [facility] (bad experience) and could we mail prescription to her.  I mailed to her (06-30-17).  Also faxed to [facility] [name] drug safety (with confirmation) the form for an adverse event (cough) after receiving ocrevus.  [phone].

## 2017-07-01 NOTE — Telephone Encounter (Signed)
Spoke to pt and she has already spoke to them.

## 2017-07-01 NOTE — Telephone Encounter (Signed)
Called and pt does not want to wait for it in the mail.  Will redo order.

## 2017-07-01 NOTE — Telephone Encounter (Signed)
Patients mother Eunice Blase called office in reference for prescription for Tub transfer bench.  She would like to see if we can send order to [facility] fax # [phone] (ATTN: [facility]) phone #- [phone].  Please call to confirm order has been sent.

## 2017-07-02 DIAGNOSIS — G8191 Hemiplegia, unspecified affecting right dominant side: Secondary | ICD-10-CM | POA: Diagnosis not present

## 2017-07-02 DIAGNOSIS — G35 Multiple sclerosis: Secondary | ICD-10-CM | POA: Diagnosis not present

## 2017-07-05 DIAGNOSIS — G35 Multiple sclerosis: Secondary | ICD-10-CM | POA: Diagnosis not present

## 2017-07-05 DIAGNOSIS — G8191 Hemiplegia, unspecified affecting right dominant side: Secondary | ICD-10-CM | POA: Diagnosis not present

## 2017-07-06 DIAGNOSIS — G8191 Hemiplegia, unspecified affecting right dominant side: Secondary | ICD-10-CM | POA: Diagnosis not present

## 2017-07-06 DIAGNOSIS — G35 Multiple sclerosis: Secondary | ICD-10-CM | POA: Diagnosis not present

## 2017-07-09 DIAGNOSIS — G8191 Hemiplegia, unspecified affecting right dominant side: Secondary | ICD-10-CM | POA: Diagnosis not present

## 2017-07-09 DIAGNOSIS — G35 Multiple sclerosis: Secondary | ICD-10-CM | POA: Diagnosis not present

## 2017-07-13 DIAGNOSIS — F419 Anxiety disorder, unspecified: Secondary | ICD-10-CM | POA: Diagnosis not present

## 2017-07-14 DIAGNOSIS — G35 Multiple sclerosis: Secondary | ICD-10-CM | POA: Diagnosis not present

## 2017-07-14 DIAGNOSIS — G8191 Hemiplegia, unspecified affecting right dominant side: Secondary | ICD-10-CM | POA: Diagnosis not present

## 2017-07-15 DIAGNOSIS — G35 Multiple sclerosis: Secondary | ICD-10-CM | POA: Diagnosis not present

## 2017-07-15 DIAGNOSIS — G8191 Hemiplegia, unspecified affecting right dominant side: Secondary | ICD-10-CM | POA: Diagnosis not present

## 2017-07-21 DIAGNOSIS — F419 Anxiety disorder, unspecified: Secondary | ICD-10-CM | POA: Diagnosis not present

## 2017-07-22 DIAGNOSIS — G8191 Hemiplegia, unspecified affecting right dominant side: Secondary | ICD-10-CM | POA: Diagnosis not present

## 2017-07-22 DIAGNOSIS — G35 Multiple sclerosis: Secondary | ICD-10-CM | POA: Diagnosis not present

## 2017-08-04 DIAGNOSIS — F419 Anxiety disorder, unspecified: Secondary | ICD-10-CM | POA: Diagnosis not present

## 2017-09-01 DIAGNOSIS — F419 Anxiety disorder, unspecified: Secondary | ICD-10-CM | POA: Diagnosis not present

## 2017-09-15 DIAGNOSIS — F419 Anxiety disorder, unspecified: Secondary | ICD-10-CM | POA: Diagnosis not present

## 2017-09-20 DIAGNOSIS — G3184 Mild cognitive impairment, so stated: Secondary | ICD-10-CM | POA: Diagnosis not present

## 2017-09-20 DIAGNOSIS — R278 Other lack of coordination: Secondary | ICD-10-CM | POA: Diagnosis not present

## 2017-09-20 DIAGNOSIS — K5900 Constipation, unspecified: Secondary | ICD-10-CM | POA: Diagnosis not present

## 2017-09-20 DIAGNOSIS — Z79899 Other long term (current) drug therapy: Secondary | ICD-10-CM | POA: Diagnosis not present

## 2017-09-21 ENCOUNTER — Telehealth: Payer: Self-pay | Admitting: Diagnostic Neuroimaging

## 2017-09-21 NOTE — Telephone Encounter (Signed)
Pt calling to inform that [facility] [place], [place] - [address] AT [facility] [phone] (Phone) [phone] (Fax)   Informed her that she is out of refills of   and she needs a new prescription tiZANidine (ZANAFLEX) 4 MG tablet Pt has not requested a RN call back

## 2017-09-22 MED ORDER — TIZANIDINE HCL 4 MG PO TABS: 4.0000 mg | ORAL_TABLET | Freq: Three times a day (TID) | ORAL | 3 refills | Status: DC

## 2017-09-22 NOTE — Addendum Note (Signed)
Addended by: Colen Darling C on: 09/22/2017 11:14 AM   Modules accepted: Orders

## 2017-09-22 NOTE — Telephone Encounter (Signed)
Refill Rx sent to [facility] at patient's request.

## 2017-09-24 ENCOUNTER — Encounter: Payer: Self-pay | Admitting: Diagnostic Neuroimaging

## 2017-09-27 MED ORDER — TIZANIDINE HCL 4 MG PO TABS: 4.0000 mg | ORAL_TABLET | Freq: Three times a day (TID) | ORAL | 3 refills | Status: DC

## 2017-09-27 NOTE — Telephone Encounter (Signed)
Received my chart from patient stating Tizanidine was refused to be filled to  [facility]. This RN replied to patient apologizing, stating [facility] Advised patient [facility]. Tizanidine reordered.

## 2017-09-27 NOTE — Addendum Note (Signed)
Addended by: [place] Pink on: 09/27/2017 08:19 AM   Modules accepted: Orders

## 2017-09-29 DIAGNOSIS — F419 Anxiety disorder, unspecified: Secondary | ICD-10-CM | POA: Diagnosis not present

## 2017-10-08 ENCOUNTER — Telehealth: Payer: Self-pay | Admitting: *Deleted

## 2017-10-08 NOTE — Telephone Encounter (Signed)
I agree needs the ED thanks

## 2017-10-08 NOTE — Telephone Encounter (Addendum)
Pt called has had progressive worsening of MS sx for the last 1 1/2 weeks, L leg weakness, ambulating slower and with 2 min of standing is in a lot of pain.  Has urine (has had in past) and bowel incontinence which is new.  Has appt next week with Dr. Marjory Lies.  MS therapy is ocrevus last infusion 05/2017 and next 11/15/17. Due to sx and being weekend, recommended to go to ER for eval and treatment.  Would forward to [facility] as FYI.

## 2017-10-08 NOTE — Telephone Encounter (Signed)
I spoke to pt and her mother and reiterated to seek care ar ED for evaluation and treatment.  Mother feels bowel incontinence is from miralax daily and collard greens.  She does not see a big difference in [place] (she states she is hard headed).  Even though, I recommended to go to ED for eval.  (may need steroids IV and MRI).  She verbalized understanding.

## 2017-10-13 DIAGNOSIS — F419 Anxiety disorder, unspecified: Secondary | ICD-10-CM | POA: Diagnosis not present

## 2017-10-18 ENCOUNTER — Ambulatory Visit (INDEPENDENT_AMBULATORY_CARE_PROVIDER_SITE_OTHER): Payer: Medicare Other | Admitting: Diagnostic Neuroimaging

## 2017-10-18 ENCOUNTER — Encounter: Payer: Self-pay | Admitting: *Deleted

## 2017-10-18 ENCOUNTER — Telehealth: Payer: Self-pay | Admitting: Diagnostic Neuroimaging

## 2017-10-18 ENCOUNTER — Encounter: Payer: Self-pay | Admitting: Diagnostic Neuroimaging

## 2017-10-18 VITALS — BP 94/57 | HR 73 | Ht 67.0 in | Wt 216.0 lb

## 2017-10-18 DIAGNOSIS — R5383 Other fatigue: Secondary | ICD-10-CM | POA: Diagnosis not present

## 2017-10-18 DIAGNOSIS — M62838 Other muscle spasm: Secondary | ICD-10-CM | POA: Diagnosis not present

## 2017-10-18 DIAGNOSIS — G35 Multiple sclerosis: Secondary | ICD-10-CM | POA: Diagnosis not present

## 2017-10-18 MED ORDER — OXYBUTYNIN CHLORIDE ER 10 MG PO TB24: 10.0000 mg | ORAL_TABLET | Freq: Every day | ORAL | 12 refills | Status: DC

## 2017-10-18 MED ORDER — TIZANIDINE HCL 4 MG PO TABS: 4.0000 mg | ORAL_TABLET | Freq: Three times a day (TID) | ORAL | 12 refills | Status: DC

## 2017-10-18 MED ORDER — GABAPENTIN 300 MG PO CAPS: 300.0000 mg | ORAL_CAPSULE | Freq: Three times a day (TID) | ORAL | 12 refills | Status: DC

## 2017-10-18 MED ORDER — BACLOFEN 10 MG PO TABS: 20.0000 mg | ORAL_TABLET | Freq: Three times a day (TID) | ORAL | 12 refills | Status: DC

## 2017-10-18 NOTE — Telephone Encounter (Signed)
Ebony Warren with [facility] is calling to get a verbal  refill for Ocrelizumab (OCREVUS IV) for maintenance dosage.

## 2017-10-18 NOTE — Progress Notes (Signed)
[facility]  PATIENT: Ebony Warren DOB: Feb 04, 1976  REFERRING CLINICIAN: Williams  HISTORY FROM: patient  REASON FOR VISIT: follow up   HISTORICAL  CHIEF COMPLAINT:  Chief Complaint  Patient presents with  . Follow-up    Has noted cough since started ocrevus (phlegm).   . Multiple Sclerosis    Ocrevus (05/10/2017, next infusion 11/15/17)   Using rollator walker.  Had fall, (hurt thumb, and R elbow).   Had some incontincence 2-3 wks ago.  Stopped miralax, is better now  after started mentrual cycle.  Havign some issues with forgetting.(picking out words)     HISTORY OF PRESENT ILLNESS:   UPDATE (10/18/17, VRP): Since last visit, doing well. Tolerating ocrevus. No alleviating or aggravating factors. No new events. C/o cough and dry mouth issues.   UPDATE 05/19/17: Since last visit, now transitioned to ocrevus (May 21, June 4). Did well with infusion. Spasms stable. Left leg slightly better. Bladder slightly better. Depression stable. Using rollator walker. No other triggering factors. Overall doing well.   UPDATE 02/08/17: Since last visit, MRI brain shows 1 new lesion. Here to discuss ocrevus. Also with more left leg pain, muscle spasms, gait diff, fatigue. Right ear pain better. Bladder incont slightly better with oxybutinin initially, but not anymore.   UPDATE 12/22/16: Since last visit, was doing well until Nov 2017 and then developed stabbing right ear pain (continues to present). Also with new and increasing spasm and cramps in right hand and left leg since Dec 2017. Continues with fatigue and bladder incontinence.  UPDATE 05/13/16: Since last visit, doing well. No new MS events or symptoms. In retrospect, may have lost vision in right eye (2015, and possibly March 2016), lasting 3-7 days. Didn't mention to me until now. Recently this was noted optometry and ophthalmology exams.   UPDATE 01/27/16: Since last visit, fatigue improved with adjusting  medication timing. Bladder issues stable. Had UTI at last visit, tx'd with abx, now better.  UPDATE 10/22/15: Since last visit was doing well. Then last week had new onset weakness in legs, bladder incontinence, strong smelling urine, and hand tremors. Now sxs almost fully resolved since yesterday.  UPDATE 05/01/15: Symptoms stable. Tolerating tecfidera.  UPDATE 01/25/15: Since last visit, had more problems for a few weeks (lower ext weakness, bladder incont), but now improved. Now on tecfidera x 3 weeks. Some itching and stomach issues with tecfidera, but mild. Main issues now consist of lower extremity pain, spasms, urinary leakage, daytime fatigue.  UPDATE 10/31/14: Since last visit, sxs are stable. Test results and diagnosis reviewed. No new events. Using cane to walk.  UPDATE (10/02/14): 41 year old right-handed female here for evaluation of double vision. 2008 patient was 6 months pregnant with her son, when all of a sudden she didn't feel good. She noticed right hand clumsiness and incoordination. This lasted for approximately 9 months and then stopped. She did not seek medical attention for this problem. 2013 patient had onset of right arm weakness, right leg weakness, numbness on the right side, intermittent shaking sensation all over. Patient was evaluated in [place] and diagnosed with possible stroke. 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 Patient did not recover right arm or leg strength. Her speech was also affected. October 2014 patient moved to [facility]. 08/30/2014 patient was at home hanging clothes over the bathtub when she fell down. She is not sure what caused her fall. Her right leg may have given out. Patient fell and struck her head against the soap dish which broke  off. Patient did not lose consciousness. Patient's family advised her to go to the emergency room. Patient was evaluated with CT and MRI of the brain. No acute findings were found. MRI of the brain did show  multiple white matter lesions suspicious for chronic multiple sclerosis versus chronic small vessel ischemic disease. Advised to follow-up in outpatient basis. Since this fall patient has noticed some additional blurred vision, and double vision especially when looking to the left side.   REVIEW OF SYSTEMS: Full 14 system review of systems performed and negative except for: cough leg swelling speech diff walking diff memory loss depression anxiety hyperactive.    ALLERGIES: Allergies  Allergen Reactions  . Morphine Itching  . Orange Fruit [Citrus]     Acidic foods Makes her itchy and her face "feels like its on fire", have rash.  . Oxycodone Itching  . Percocet [Oxycodone-Acetaminophen]     Stomach cramps   . Vicodin [Hydrocodone-Acetaminophen]     Stomach cramps   . Other Rash    Narcotics. Spicy foods (cause seizures)    HOME MEDICATIONS: Outpatient Medications Prior to Visit  Medication Sig Dispense Refill  . baclofen (LIORESAL) 10 MG tablet Take 2 tablets (20 mg total) by mouth 3 (three) times daily. 180 tablet 12  . cholecalciferol (VITAMIN D) 1000 UNITS tablet Take 1,000 Units by mouth daily. Take one a day for 6-12 months and we will recheck your levels when you come back in the office in 6 months    . diphenhydrAMINE (BENADRYL) 25 mg capsule Take 25 mg by mouth every 6 (six) hours as needed. allergies    . escitalopram (LEXAPRO) 20 MG tablet Take 20 mg by mouth.    . gabapentin (NEURONTIN) 300 MG capsule Take 1-2 capsules (300-600 mg total) by mouth 3 (three) times daily. 180 capsule 11  . LORazepam (ATIVAN) 1 MG tablet Take 1 mg by mouth daily.    . Multiple Vitamins-Minerals (MULTIVITAMIN ADULT PO) Take 1 tablet by mouth daily.    [employer] (OCREVUS IV) Inject into the vein. Every 6 months    . oxybutynin (DITROPAN-XL) 10 MG 24 hr tablet Take 1 tablet (10 mg total) by mouth at bedtime. 30 tablet 12  . tiZANidine (ZANAFLEX) 4 MG tablet Take 1 tablet (4 mg total) by  mouth 3 (three) times daily. 90 tablet 3  . naproxen sodium (ANAPROX) 220 MG tablet Take 220 mg by mouth 2 (two) times daily with a meal.     No facility-administered medications prior to visit.     PAST MEDICAL HISTORY: Past Medical History:  Diagnosis Date  . Anxiety and depression   . Bradycardia   . Multiple sclerosis (HCC)   . Seizures (HCC)    told that they are not from the brain but stress related    PAST SURGICAL HISTORY: Past Surgical History:  Procedure Laterality Date  . CHOLECYSTECTOMY    . KNEE SURGERY      FAMILY HISTORY: Family History  Problem Relation Age of Onset  . Hypertension Mother   . Atrial fibrillation Mother   . Hypertension Father   . Atrial fibrillation Maternal Grandmother     SOCIAL HISTORY:  Social History   Socioeconomic History  . Marital status: Single    Spouse name: Not on file  . Number of children: 1  . Years of education: College  . Highest education level: Not on file  Social Needs  . Financial resource strain: Not on file  . Food insecurity -  worry: Not on file  . Food insecurity - inability: Not on file  . Transportation needs - medical: Not on file  . Transportation needs - non-medical: Not on file  Occupational History    Employer: OTHER    Comment: disabled  Tobacco Use  . Smoking status: Former Smoker    Packs/day: 0.35    Years: 3.00    Pack years: 1.05    Types: Cigarettes    Last attempt to quit: 12/07/2006    Years since quitting: 10.8  . Smokeless tobacco: Never Used  Substance and Sexual Activity  . Alcohol use: No    Alcohol/week: 0.0 oz    Comment: quit: 2013 (socially)  . Drug use: Yes    Types: Marijuana    Comment: quit: 2001   . Sexual activity: Not on file  Other Topics Concern  . Not on file  Social History Narrative   Patient lives at home with family.   Caffeine Use: drinks a cup of hot tea with tecfidera in the am   Patient is right handed.   Patient has a college education      PHYSICAL EXAM  Vitals:   10/18/17 0916  BP: (!) 94/57  Pulse: 73  Weight: 216 lb (98 kg)  Height: 5\' 7"  (1.702 m)    Visual Acuity Screening   Right eye Left eye Both eyes  Without correction:     With correction: 20/70 20/100      Body mass index is 33.83 kg/m.  GENERAL EXAM: Patient is in no distress; well developed, nourished and groomed; neck is supple  CARDIOVASCULAR: Regular rate and rhythm, no murmurs, no carotid bruits  NEUROLOGIC: MENTAL STATUS: awake, alert, language fluent, comprehension intact, naming intact, fund of knowledge appropriate; SLOW SCANNING SPEECH PATTERN CRANIAL NERVE: PUPILS PINPOINT AND REACTIVE; visual fields full to confrontation, extraocular muscles intact; NO PTOSIS; facial sensation symmetric, FACE --> DEC LEFT NL FOLD, hearing intact, palate elevates symmetrically, uvula midline, shoulder shrug symmetric, tongue midline. MOTOR: NORMAL TONE IN RIGHT ARM; SLIGHT INCR TONE IN LUE; RUE 4 PROX AND 3 DISTAL; RLE 2-3 PROX AND 4 DISTAL; LUE NORMAL; LLE 5 SENSORY: DECR IN RIGHT HAND AND RIGHT FOOT TO ALL MODALITIES COORDINATION: BUE DYSMETRIA; SLOW FINGER AND FOOT TAP ON RIGHT REFLEXES: BUE 2; BLE KNEES 3, ANKLES 2 GAIT/STATION: SPASTIC, HEMIPARETIC GAIT WITH RIGHT LEG WEAKNESS/STIFFNESS; UNSTEADY; USING ROLLATOR.    DIAGNOSTIC DATA (LABS, IMAGING, TESTING) - I reviewed patient records, labs, notes, testing and imaging myself where available.  Lab Results  Component Value Date   WBC 4.6 02/08/2017   HGB 13.1 02/08/2017   HCT 39.0 02/08/2017   MCV 96 02/08/2017   PLT 207 02/08/2017      Component Value Date/Time   NA 140 02/08/2017 1106   K 4.6 02/08/2017 1106   CL 103 02/08/2017 1106   CO2 25 02/08/2017 1106   GLUCOSE 76 02/08/2017 1106   GLUCOSE 91 08/30/2014 1031   BUN 13 02/08/2017 1106   CREATININE 0.69 02/08/2017 1106   CALCIUM 9.3 02/08/2017 1106   PROT 6.8 02/08/2017 1106   ALBUMIN 4.0 02/08/2017 1106   AST 12  02/08/2017 1106   ALT 9 02/08/2017 1106   ALKPHOS 41 02/08/2017 1106   BILITOT 0.5 02/08/2017 1106   GFRNONAA 110 02/08/2017 1106   GFRAA 127 02/08/2017 1106   No results found for: CHOL No results found for: HGBA1C No results found for: VITAMINB12 No results found for: TSH  Vit D, 25-Hydroxy  Date Value Ref Range Status  12/22/2016 34.1 30.0 - 100.0 ng/mL Final    Comment:    Vitamin D deficiency has been defined by the Institute of Medicine and an Endocrine Society practice guideline as a level of serum 25-OH vitamin D less than 20 ng/mL (1,2). The Endocrine Society went on to further define vitamin D insufficiency as a level between 21 and 29 ng/mL (2). 1. IOM (Institute of Medicine). 2010. Dietary reference    intakes for calcium and D. [place]: The    [employer]. 2. Holick MF, Binkley [place], Bischoff-Ferrari HA, et al.    Evaluation, treatment, and prevention of vitamin D    deficiency: an Endocrine Society clinical practice    guideline. JCEM. 2011 Jul; 96(7):1911-30.   10/22/2015 35.7 30.0 - 100.0 ng/mL Final    Comment:    Vitamin D deficiency has been defined by the Institute of Medicine and an Endocrine Society practice guideline as a level of serum 25-OH vitamin D less than 20 ng/mL (1,2). The Endocrine Society went on to further define vitamin D insufficiency as a level between 21 and 29 ng/mL (2). 1. IOM (Institute of Medicine). 2010. Dietary reference    intakes for calcium and D. [place]: The    [employer]. 2. Holick MF, Binkley [place], Bischoff-Ferrari HA, et al.    Evaluation, treatment, and prevention of vitamin D    deficiency: an Endocrine Society clinical practice    guideline. JCEM. 2011 Jul; 96(7):1911-30.   05/01/2015 25.3 (L) 30.0 - 100.0 ng/mL Final    Comment:    Vitamin D deficiency has been defined by the Institute of Medicine and an Endocrine Society practice guideline as a level of serum 25-OH vitamin D  less than 20 ng/mL (1,2). The Endocrine Society went on to further define vitamin D insufficiency as a level between 21 and 29 ng/mL (2). 1. IOM (Institute of Medicine). 2010. Dietary reference    intakes for calcium and D. [place]: The    [employer]. 2. Holick MF, Binkley [place], Bischoff-Ferrari HA, et al.    Evaluation, treatment, and prevention of vitamin D    deficiency: an Endocrine Society clinical practice    guideline. JCEM. 2011 Jul; 96(7):1911-30.    Lymphocytes Absolute  Date Value Ref Range Status  02/08/2017 1.7 0.7 - 3.1 x10E3/uL Final  12/22/2016 1.6 0.7 - 3.1 x10E3/uL Final  05/13/2016 0.8 0.7 - 3.1 x10E3/uL Final    11/05/15 MRI cervical spine (with and without) [I reviewed images myself and agree with interpretation. -VRP]  1. Multiple (~7-8) chronic demyelinating plaques from cervico-medullary junction down to C7 level. 2. No acute plaques. 3. No change from MRI on 10/24/14.   11/05/15 MRI brain (with and without) [I reviewed images myself and agree with interpretation. -VRP]  1. Few periventricular and peri-callosal and juxtacortical and medullary chronic demyelinating plaques.  2. No abnormal lesions are seen on post contrast views.  3. No change from MRI on 08/30/14.   01/04/17 MRI brain (with and without) demonstrating: 1. There are a few periventricular, peri-callosal, juxtacortical chronic demyelinating plaques.  2. No acute plaques.  3. Compared to MRI on 11/05/15, there is 1 new left parietal juxtacortical lesion (series 8, image 37).  Labs - ANA, ANCA, ACE, HIV, RPR - all negative  11/01/14 anti-JCV ab - 1.31 (H) positive    ASSESSMENT AND PLAN  41 y.o. year old female here with multiple focal neurologic attacks since 2008. Neurologic exam  demonstrates long tract signs affecting the right arm and right leg more than left side. 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 Other labs negative.   On  tecfidera since Feb 2016. Had flare up symptoms in Nov 2016 and Nov-Dec 2017. Also new plaque in Jan 2018. Now on ocrevus since May/June 2018.   Muscle spasms and leg pain (on baclofen, tizanidine and gabapentin).  Fatigue improved with medication regimen adjustment.   Bladder incontinence --> using adult pads and oxybutinin   Depression stable ([facility]    Dx:  Multiple sclerosis (HCC) - Plan: CBC with Differential/Platelet, Comprehensive metabolic panel, MR BRAIN W WO CONTRAST  Muscle spasm  Other fatigue    PLAN:  Multiple sclerosis disease modifying therapy (established problem, stable) - continue ocrevus; check CBC, CMP every 6 months - repeat MRI brain in Jan/Feb 2019 - continue multivitamin and vitamin D supplements  Muscle spasms (established problem, stable) - continue baclofen 20mg  three times per day for muscle spasms - continue tizanidine 4mg  three times per day for muscle spasms  Left leg pain (established problem, stable) - continue gabapentin up to 600mg  three times per day  Bladder incontinence (established problem, improved) - continue oxybutynin 10mg  XL daily  Depression (established problem, worsening) - per Dr. Lilian KapurMcDonald [facility] - continue lexapro  Orders Placed This Encounter  Procedures  . MR BRAIN W WO CONTRAST  . CBC with Differential/Platelet  . Comprehensive metabolic panel   Meds ordered this encounter  Medications  . tiZANidine (ZANAFLEX) 4 MG tablet    Sig: Take 1 tablet (4 mg total) 3 (three) times daily by mouth.    Dispense:  90 tablet    Refill:  12  . oxybutynin (DITROPAN-XL) 10 MG 24 hr tablet    Sig: Take 1 tablet (10 mg total) at bedtime by mouth.    Dispense:  30 tablet    Refill:  12  . gabapentin (NEURONTIN) 300 MG capsule    Sig: Take 1-2 capsules (300-600 mg total) 3 (three) times daily by mouth.    Dispense:  180 capsule    Refill:  12  . baclofen (LIORESAL) 10 MG tablet     Sig: Take 2 tablets (20 mg total) 3 (three) times daily by mouth.    Dispense:  180 tablet    Refill:  12   Return in about 6 months (around 04/17/2018).    Suanne MarkerVIKRAM R. PENUMALLI, MD 10/18/2017, 9:41 AM Certified in Neurology, Neurophysiology and Neuroimaging  [facility] [address], [address] [place], [place] [address] [phone]

## 2017-10-18 NOTE — Patient Instructions (Signed)
-   check labs today  - continue current medications

## 2017-10-19 ENCOUNTER — Telehealth: Payer: Self-pay | Admitting: *Deleted

## 2017-10-19 LAB — CBC WITH DIFFERENTIAL/PLATELET
BASOS ABS: 0 10*3/uL (ref 0.0–0.2)
Basos: 0 %
EOS (ABSOLUTE): 0.1 10*3/uL (ref 0.0–0.4)
Eos: 2 %
Hematocrit: 39.1 % (ref 34.0–46.6)
Hemoglobin: 13 g/dL (ref 11.1–15.9)
IMMATURE GRANS (ABS): 0 10*3/uL (ref 0.0–0.1)
IMMATURE GRANULOCYTES: 0 %
LYMPHS: 35 %
Lymphocytes Absolute: 1.2 10*3/uL (ref 0.7–3.1)
MCH: 32 pg (ref 26.6–33.0)
MCHC: 33.2 g/dL (ref 31.5–35.7)
MCV: 96 fL (ref 79–97)
Monocytes Absolute: 0.3 10*3/uL (ref 0.1–0.9)
Monocytes: 8 %
NEUTROS PCT: 55 %
Neutrophils Absolute: 1.9 10*3/uL (ref 1.4–7.0)
PLATELETS: 234 10*3/uL (ref 150–379)
RBC: 4.06 x10E6/uL (ref 3.77–5.28)
RDW: 13.5 % (ref 12.3–15.4)
WBC: 3.5 10*3/uL (ref 3.4–10.8)

## 2017-10-19 LAB — COMPREHENSIVE METABOLIC PANEL
A/G RATIO: 1.5 (ref 1.2–2.2)
ALK PHOS: 55 IU/L (ref 39–117)
ALT: 11 IU/L (ref 0–32)
AST: 17 IU/L (ref 0–40)
Albumin: 3.8 g/dL (ref 3.5–5.5)
BILIRUBIN TOTAL: 0.3 mg/dL (ref 0.0–1.2)
BUN/Creatinine Ratio: 15 (ref 9–23)
BUN: 14 mg/dL (ref 6–24)
CHLORIDE: 104 mmol/L (ref 96–106)
CO2: 26 mmol/L (ref 20–29)
Calcium: 9.1 mg/dL (ref 8.7–10.2)
Creatinine, Ser: 0.94 mg/dL (ref 0.57–1.00)
GFR calc Af Amer: 88 mL/min/{1.73_m2} (ref >59)
GFR calc non Af Amer: 76 mL/min/{1.73_m2} (ref >59)
Globulin, Total: 2.6 g/dL (ref 1.5–4.5)
Glucose: 86 mg/dL (ref 65–99)
POTASSIUM: 4.7 mmol/L (ref 3.5–5.2)
Sodium: 143 mmol/L (ref 134–144)
Total Protein: 6.4 g/dL (ref 6.0–8.5)

## 2017-10-19 NOTE — Telephone Encounter (Signed)
I relayed this to Inetta Fermo in [place].

## 2017-10-19 NOTE — Telephone Encounter (Signed)
Spoke with patient and informed her that her labs are unremarkable. She then stated that [facility] her saying they need a refill on Ocrevus. Her next infusion is due in Dec. This RN stated will let Inetta Fermo, infusion RN know. She verbalized understanding, appreciation of call.  Spoke with Inetta Fermo RN who stated she has a note to call the pharmacy and give verbal Rx. She stated she will call.

## 2017-10-20 NOTE — Telephone Encounter (Signed)
[facility] 10[facility]20-17 stating received enrollment for pt for the ocrevus therapy.  Gave to Homer in intrafusion.

## 2017-10-25 DIAGNOSIS — F419 Anxiety disorder, unspecified: Secondary | ICD-10-CM | POA: Diagnosis not present

## 2017-11-07 ENCOUNTER — Ambulatory Visit
Admission: RE | Admit: 2017-11-07 | Discharge: 2017-11-07 | Disposition: A | Discharge status: Home / Self Care | Payer: Medicare Other | Source: Ambulatory Visit | Attending: Diagnostic Neuroimaging | Admitting: Diagnostic Neuroimaging

## 2017-11-07 DIAGNOSIS — G35 Multiple sclerosis: Secondary | ICD-10-CM

## 2017-11-07 MED ORDER — GADOBENATE DIMEGLUMINE 529 MG/ML IV SOLN
20.0000 mL | Freq: Once | INTRAVENOUS | Status: AC | PRN
  Administered 2017-11-07: 20 mL via INTRAVENOUS

## 2017-11-08 ENCOUNTER — Telehealth: Payer: Self-pay | Admitting: *Deleted

## 2017-11-08 NOTE — Telephone Encounter (Signed)
Agree. -VRP 

## 2017-11-08 NOTE — Telephone Encounter (Signed)
Spoke with patient and informed her that her MRI brain scan is stable.  Patient verbalized understanding. She then reported rashes that come and go as flat little red spots that itch and burn. Last week the inside of her ear was "burning", but there was no rash. Yesterday her shoulders were affected by the rash; Hydrocortisone cream gave relief. Today the rash is on her hairline at forehead. Hydrocortisone cream has not helped. She noticed this rash for approximately  2 weeks. She denied new soaps, lotions, etc at home. This RN advised that if the rash gets worse and/or becomes bothersome she should contact her PCP, and may need a referral to a dermatologist. Advised will let Dr Marjory LiesPenumalli know and call her back if he has any further instructions, recommendations.  Patient stated that at this time "It's not that bad". She verbalized understanding, appreciation of call.

## 2017-11-10 DIAGNOSIS — F419 Anxiety disorder, unspecified: Secondary | ICD-10-CM | POA: Diagnosis not present

## 2017-11-24 DIAGNOSIS — G35 Multiple sclerosis: Secondary | ICD-10-CM | POA: Diagnosis not present

## 2017-12-17 DIAGNOSIS — F330 Major depressive disorder, recurrent, mild: Secondary | ICD-10-CM | POA: Insufficient documentation

## 2018-02-01 ENCOUNTER — Telehealth: Payer: Self-pay | Admitting: Diagnostic Neuroimaging

## 2018-02-01 MED ORDER — GABAPENTIN 300 MG PO CAPS: 300.0000 mg | ORAL_CAPSULE | Freq: Three times a day (TID) | ORAL | 12 refills | Status: DC

## 2018-02-01 MED ORDER — OXYBUTYNIN CHLORIDE ER 10 MG PO TB24: 10.0000 mg | ORAL_TABLET | Freq: Every day | ORAL | 12 refills | Status: DC

## 2018-02-01 MED ORDER — BACLOFEN 10 MG PO TABS: 20.0000 mg | ORAL_TABLET | Freq: Three times a day (TID) | ORAL | 12 refills | Status: DC

## 2018-02-01 NOTE — Addendum Note (Signed)
Addended by: [place] Pink on: 02/01/2018 04:23 PM   Modules accepted: Orders

## 2018-02-01 NOTE — Telephone Encounter (Signed)
Pt is requesting a new prescription for gabapentin (NEURONTIN) 300 MG capsule and baclofen (LIORESAL) 10 MG tablet be sent to [facility] since it recently switched from a [facility]

## 2018-02-01 NOTE — Telephone Encounter (Addendum)
Still unable to reach [facility] to check status of Rx sent Nov 2018.  Will e scribe baclofen, ditropan, neurontin to [facility] and notify patient. Spoke with patient and advised her of new Rx sent in. She verbalized understanding, appreciation.

## 2018-02-01 NOTE — Telephone Encounter (Addendum)
Called patient to inform her that Dr Marjory Lies refilled all her medications in Nov 2018 for one full year. She stated she called pharmacy today and was told they had no refills on file for her.  This RN advised patient she will call pharmacy.  Called [facility] x 2. On hold 10 minutes, unable to hold any longer. Will call back.

## 2018-02-10 ENCOUNTER — Telehealth: Payer: Self-pay | Admitting: Diagnostic Neuroimaging

## 2018-02-10 MED ORDER — TIZANIDINE HCL 4 MG PO TABS: 4.0000 mg | ORAL_TABLET | Freq: Three times a day (TID) | ORAL | 12 refills | Status: DC

## 2018-02-10 NOTE — Addendum Note (Signed)
Addended by: Guy Begin on: 02/10/2018 04:29 PM   Modules accepted: Orders

## 2018-02-10 NOTE — Telephone Encounter (Signed)
Pt states she would like to have her tizanidine 30 day supply) renewed at the [facility], (she states she has always had this done there).  I relayed that can redo this and [facility].  Spoke to [place] with [facility] and canceled the [facility]

## 2018-02-10 NOTE — Telephone Encounter (Signed)
Pt is asking for a call from RN Andrey Campanile re: her tiZANidine (ZANAFLEX) 4 MG tablet

## 2018-03-01 ENCOUNTER — Other Ambulatory Visit: Payer: Self-pay | Admitting: Diagnostic Neuroimaging

## 2018-04-13 ENCOUNTER — Telehealth: Payer: Self-pay | Admitting: Diagnostic Neuroimaging

## 2018-04-13 NOTE — Telephone Encounter (Signed)
Pt states she contacted the [employer] to request a cooling vest.  Pt states they need a letter stating her diagnosis from her Neurologist faxed to the attention of Vicky @ [phone].  Their telephone # is [phone]

## 2018-04-14 ENCOUNTER — Encounter: Payer: Self-pay | Admitting: *Deleted

## 2018-04-14 ENCOUNTER — Telehealth: Payer: Self-pay | Admitting: Diagnostic Neuroimaging

## 2018-04-14 NOTE — Telephone Encounter (Signed)
Patient would like a call back from nurse when letter for cooling vest has been faxed. Best call back is [phone]

## 2018-04-14 NOTE — Telephone Encounter (Signed)
Letter ready for MD signature.

## 2018-04-14 NOTE — Telephone Encounter (Signed)
Please go ahead with letter for vest. -VRP

## 2018-04-14 NOTE — Telephone Encounter (Signed)
Spoke with patient. She is aware that the letter for the cooling vest was successfully faxed to [facility]. She verbalized appreciation.

## 2018-04-18 ENCOUNTER — Ambulatory Visit (INDEPENDENT_AMBULATORY_CARE_PROVIDER_SITE_OTHER): Payer: Medicare Other | Admitting: Diagnostic Neuroimaging

## 2018-04-18 ENCOUNTER — Encounter: Payer: Self-pay | Admitting: Diagnostic Neuroimaging

## 2018-04-18 VITALS — BP 99/60 | HR 67 | Ht 67.0 in | Wt 235.6 lb

## 2018-04-18 DIAGNOSIS — M7989 Other specified soft tissue disorders: Secondary | ICD-10-CM

## 2018-04-18 DIAGNOSIS — N39498 Other specified urinary incontinence: Secondary | ICD-10-CM | POA: Diagnosis not present

## 2018-04-18 MED ORDER — OXYBUTYNIN CHLORIDE ER 10 MG PO TB24: 10.0000 mg | ORAL_TABLET | Freq: Every day | ORAL | 12 refills | Status: DC

## 2018-04-18 MED ORDER — TIZANIDINE HCL 4 MG PO TABS: 4.0000 mg | ORAL_TABLET | Freq: Three times a day (TID) | ORAL | 12 refills | Status: DC

## 2018-04-18 MED ORDER — BACLOFEN 10 MG PO TABS: 20.0000 mg | ORAL_TABLET | Freq: Three times a day (TID) | ORAL | 12 refills | Status: DC

## 2018-04-18 MED ORDER — GABAPENTIN 300 MG PO CAPS: 600.0000 mg | ORAL_CAPSULE | Freq: Three times a day (TID) | ORAL | 12 refills | Status: DC

## 2018-04-18 NOTE — Progress Notes (Signed)
[facility]  PATIENT: Ebony Warren DOB: 24-Apr-1976  REFERRING CLINICIAN: Williams  HISTORY FROM: patient  REASON FOR VISIT: follow up   HISTORICAL  CHIEF COMPLAINT:  Chief Complaint  Patient presents with  . Follow-up  . Multiple Sclerosis    Last June 2018 started ocrevus, has noted R foot and ankle swelling, excessive eating, appetite change, and weight gain.  Has had noted falls as well.  Has some incontinence since last Friday. (no other sx of UTI).      HISTORY OF PRESENT ILLNESS:   UPDATE (04/18/18, VRP): Since last visit, doing well on ocrevus, except had another fall. Recently more urine incont, and right leg swelling. Tolerating meds. No alleviating or aggravating factors.   UPDATE (10/18/17, VRP): Since last visit, doing well. Tolerating ocrevus. No alleviating or aggravating factors. No new events. C/o cough and dry mouth issues.   UPDATE 05/19/17: Since last visit, now transitioned to ocrevus (May 21, June 4). Did well with infusion. Spasms stable. Left leg slightly better. Bladder slightly better. Depression stable. Using rollator walker. No other triggering factors. Overall doing well.   UPDATE 02/08/17: Since last visit, MRI brain shows 1 new lesion. Here to discuss ocrevus. Also with more left leg pain, muscle spasms, gait diff, fatigue. Right ear pain better. Bladder incont slightly better with oxybutinin initially, but not anymore.   UPDATE 12/22/16: Since last visit, was doing well until Nov 2017 and then developed stabbing right ear pain (continues to present). Also with new and increasing spasm and cramps in right hand and left leg since Dec 2017. Continues with fatigue and bladder incontinence.  UPDATE 05/13/16: Since last visit, doing well. No new MS events or symptoms. In retrospect, may have lost vision in right eye (2015, and possibly March 2016), lasting 3-7 days. Didn't mention to me until now. Recently this was noted optometry  and ophthalmology exams.   UPDATE 01/27/16: Since last visit, fatigue improved with adjusting medication timing. Bladder issues stable. Had UTI at last visit, tx'd with abx, now better.  UPDATE 10/22/15: Since last visit was doing well. Then last week had new onset weakness in legs, bladder incontinence, strong smelling urine, and hand tremors. Now sxs almost fully resolved since yesterday.  UPDATE 05/01/15: Symptoms stable. Tolerating tecfidera.  UPDATE 01/25/15: Since last visit, had more problems for a few weeks (lower ext weakness, bladder incont), but now improved. Now on tecfidera x 3 weeks. Some itching and stomach issues with tecfidera, but mild. Main issues now consist of lower extremity pain, spasms, urinary leakage, daytime fatigue.  UPDATE 10/31/14: Since last visit, sxs are stable. Test results and diagnosis reviewed. No new events. Using cane to walk.  UPDATE (10/02/14): 42 year old right-handed female here for evaluation of double vision. 2008 patient was 6 months pregnant with her son, when all of a sudden she didn't feel good. She noticed right hand clumsiness and incoordination. This lasted for approximately 9 months and then stopped. She did not seek medical attention for this problem. 2013 patient had onset of right arm weakness, right leg weakness, numbness on the right side, intermittent shaking sensation all over. Patient was evaluated in [place] and diagnosed with possible stroke. 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 Patient did not recover right arm or leg strength. Her speech was also affected. October 2014 patient moved to [facility]. 08/30/2014 patient was at home hanging clothes over the bathtub when she fell down. She is not sure what caused her fall. Her right leg may have  given out. Patient fell and struck her head against the soap dish which broke off. Patient did not lose consciousness. Patient's family advised her to go to the emergency room. Patient was  evaluated with CT and MRI of the brain. No acute findings were found. MRI of the brain did show multiple white matter lesions suspicious for chronic multiple sclerosis versus chronic small vessel ischemic disease. Advised to follow-up in outpatient basis. Since this fall patient has noticed some additional blurred vision, and double vision especially when looking to the left side.   REVIEW OF SYSTEMS: Full 14 system review of systems performed and negative except for: memory loss dizziness confusion depression anxiety incont hearing loss walking diff.    ALLERGIES: Allergies  Allergen Reactions  . Morphine Itching  . Orange Fruit [Citrus]     Acidic foods Makes her itchy and her face "feels like its on fire", have rash.  . Oxycodone Itching  . Percocet [Oxycodone-Acetaminophen]     Stomach cramps   . Vicodin [Hydrocodone-Acetaminophen]     Stomach cramps   . Other Rash    Narcotics. Spicy foods (cause seizures)    HOME MEDICATIONS: Outpatient Medications Prior to Visit  Medication Sig Dispense Refill  . baclofen (LIORESAL) 10 MG tablet Take 2 tablets (20 mg total) by mouth 3 (three) times daily. 180 tablet 12  . cholecalciferol (VITAMIN D) 1000 UNITS tablet Take 1,000 Units by mouth daily. Take one a day for 6-12 months and we will recheck your levels when you come back in the office in 6 months    . diphenhydrAMINE (BENADRYL) 25 mg capsule Take 25 mg by mouth every 6 (six) hours as needed. allergies    . escitalopram (LEXAPRO) 20 MG tablet Take 20 mg by mouth.    . gabapentin (NEURONTIN) 300 MG capsule Take 1-2 capsules (300-600 mg total) by mouth 3 (three) times daily. (Patient taking differently: Take 600 mg by mouth 3 (three) times daily. ) 180 capsule 12  . LORazepam (ATIVAN) 1 MG tablet Take 1 mg by mouth daily.    . Multiple Vitamins-Minerals (MULTIVITAMIN ADULT PO) Take 1 tablet by mouth daily.    [employer] (OCREVUS IV) Inject into the vein. Every 6 months    .  oxybutynin (DITROPAN-XL) 10 MG 24 hr tablet Take 1 tablet (10 mg total) by mouth at bedtime. 30 tablet 12  . tiZANidine (ZANAFLEX) 4 MG tablet Take 1 tablet (4 mg total) by mouth 3 (three) times daily. 90 tablet 12   No facility-administered medications prior to visit.     PAST MEDICAL HISTORY: Past Medical History:  Diagnosis Date  . Anxiety and depression   . Bradycardia   . Multiple sclerosis (HCC)   . Seizures (HCC)    told that they are not from the brain but stress related    PAST SURGICAL HISTORY: Past Surgical History:  Procedure Laterality Date  . CHOLECYSTECTOMY    . KNEE SURGERY      FAMILY HISTORY: Family History  Problem Relation Age of Onset  . Hypertension Mother   . Atrial fibrillation Mother   . Hypertension Father   . Atrial fibrillation Maternal Grandmother     SOCIAL HISTORY:  Social History   Socioeconomic History  . Marital status: Single    Spouse name: Not on file  . Number of children: 1  . Years of education: College  . Highest education level: Not on file  Occupational History    Employer: OTHER  Comment: disabled  Social Needs  . Financial resource strain: Not on file  . Food insecurity:    Worry: Not on file    Inability: Not on file  . Transportation needs:    Medical: Not on file    Non-medical: Not on file  Tobacco Use  . Smoking status: Former Smoker    Packs/day: 0.35    Years: 3.00    Pack years: 1.05    Types: Cigarettes    Last attempt to quit: 12/07/2006    Years since quitting: 11.3  . Smokeless tobacco: Never Used  Substance and Sexual Activity  . Alcohol use: No    Alcohol/week: 0.0 oz    Comment: quit: 2013 (socially)  . Drug use: Yes    Types: Marijuana    Comment: quit: 2001   . Sexual activity: Not on file  Lifestyle  . Physical activity:    Days per week: Not on file    Minutes per session: Not on file  . Stress: Not on file  Relationships  . Social connections:    Talks on phone: Not on file     Gets together: Not on file    Attends religious service: Not on file    Active member of club or organization: Not on file    Attends meetings of clubs or organizations: Not on file    Relationship status: Not on file  . Intimate partner violence:    Fear of current or ex partner: Not on file    Emotionally abused: Not on file    Physically abused: Not on file    Forced sexual activity: Not on file  Other Topics Concern  . Not on file  Social History Narrative   Patient lives at home with family.   Caffeine Use: drinks a cup of hot tea with tecfidera in the am   Patient is right handed.   Patient has a college education     PHYSICAL EXAM  Vitals:   04/18/18 1512  BP: 99/60  Pulse: 67  Weight: 235 lb 9.6 oz (106.9 kg)  Height: 5\' 7"  (1.702 m)    Visual Acuity Screening   Right eye Left eye Both eyes  Without correction:     With correction: 20/70 20/70      Body mass index is 36.9 kg/m.  GENERAL EXAM: Patient is in no distress; well developed, nourished and groomed; neck is supple  CARDIOVASCULAR: Regular rate and rhythm, no murmurs, no carotid bruits SLIGHT SWELLING IN RIGHT > LEFT FOOT / LOWER LEG  NEUROLOGIC: MENTAL STATUS: awake, alert, language fluent, comprehension intact, naming intact, fund of knowledge appropriate; SLOW SCANNING SPEECH PATTERN CRANIAL NERVE: PUPILS PINPOINT AND REACTIVE; visual fields full to confrontation, extraocular muscles intact; NO PTOSIS; facial sensation symmetric, FACE --> DEC LEFT NL FOLD, hearing intact, palate elevates symmetrically, uvula midline, shoulder shrug symmetric, tongue midline. MOTOR: NORMAL TONE IN RIGHT ARM; SLIGHT INCR TONE IN LUE; RUE 4 PROX AND 3 DISTAL; RLE 2 PROX AND 4 DISTAL; LUE NORMAL; LLE 5 SENSORY: DECR IN RIGHT HAND AND RIGHT FOOT TO ALL MODALITIES COORDINATION: BUE DYSMETRIA; SLOW FINGER AND FOOT TAP ON RIGHT REFLEXES: BUE 2; BLE KNEES 3, ANKLES 2 GAIT/STATION: SPASTIC, HEMIPARETIC GAIT WITH RIGHT LEG  WEAKNESS/STIFFNESS; UNSTEADY; USING ROLLATOR.    DIAGNOSTIC DATA (LABS, IMAGING, TESTING) - I reviewed patient records, labs, notes, testing and imaging myself where available.  Lab Results  Component Value Date   WBC 3.5 10/18/2017   HGB 13.0  10/18/2017   HCT 39.1 10/18/2017   MCV 96 10/18/2017   PLT 234 10/18/2017      Component Value Date/Time   NA 143 10/18/2017 1017   K 4.7 10/18/2017 1017   CL 104 10/18/2017 1017   CO2 26 10/18/2017 1017   GLUCOSE 86 10/18/2017 1017   GLUCOSE 91 08/30/2014 1031   BUN 14 10/18/2017 1017   CREATININE 0.94 10/18/2017 1017   CALCIUM 9.1 10/18/2017 1017   PROT 6.4 10/18/2017 1017   ALBUMIN 3.8 10/18/2017 1017   AST 17 10/18/2017 1017   ALT 11 10/18/2017 1017   ALKPHOS 55 10/18/2017 1017   BILITOT 0.3 10/18/2017 1017   GFRNONAA 76 10/18/2017 1017   GFRAA 88 10/18/2017 1017   No results found for: CHOL No results found for: HGBA1C No results found for: VITAMINB12 No results found for: TSH  Vit D, 25-Hydroxy  Date Value Ref Range Status  12/22/2016 34.1 30.0 - 100.0 ng/mL Final    Comment:    Vitamin D deficiency has been defined by the Institute of Medicine and an Endocrine Society practice guideline as a level of serum 25-OH vitamin D less than 20 ng/mL (1,2). The Endocrine Society went on to further define vitamin D insufficiency as a level between 21 and 29 ng/mL (2). 1. IOM (Institute of Medicine). 2010. Dietary reference    intakes for calcium and D. [place]: The    [employer]. 2. Holick MF, Binkley [place], Bischoff-Ferrari HA, et al.    Evaluation, treatment, and prevention of vitamin D    deficiency: an Endocrine Society clinical practice    guideline. JCEM. 2011 Jul; 96(7):1911-30.   10/22/2015 35.7 30.0 - 100.0 ng/mL Final    Comment:    Vitamin D deficiency has been defined by the Institute of Medicine and an Endocrine Society practice guideline as a level of serum 25-OH vitamin D less than 20  ng/mL (1,2). The Endocrine Society went on to further define vitamin D insufficiency as a level between 21 and 29 ng/mL (2). 1. IOM (Institute of Medicine). 2010. Dietary reference    intakes for calcium and D. [place]: The    [employer]. 2. Holick MF, Binkley [place], Bischoff-Ferrari HA, et al.    Evaluation, treatment, and prevention of vitamin D    deficiency: an Endocrine Society clinical practice    guideline. JCEM. 2011 Jul; 96(7):1911-30.   05/01/2015 25.3 (L) 30.0 - 100.0 ng/mL Final    Comment:    Vitamin D deficiency has been defined by the Institute of Medicine and an Endocrine Society practice guideline as a level of serum 25-OH vitamin D less than 20 ng/mL (1,2). The Endocrine Society went on to further define vitamin D insufficiency as a level between 21 and 29 ng/mL (2). 1. IOM (Institute of Medicine). 2010. Dietary reference    intakes for calcium and D. [place]: The    [employer]. 2. Holick MF, Binkley [place], Bischoff-Ferrari HA, et al.    Evaluation, treatment, and prevention of vitamin D    deficiency: an Endocrine Society clinical practice    guideline. JCEM. 2011 Jul; 96(7):1911-30.    Lymphocytes Absolute  Date Value Ref Range Status  10/18/2017 1.2 0.7 - 3.1 x10E3/uL Final  02/08/2017 1.7 0.7 - 3.1 x10E3/uL Final  12/22/2016 1.6 0.7 - 3.1 x10E3/uL Final    11/05/15 MRI cervical spine (with and without) [I reviewed images myself and agree with interpretation. -VRP]  1. Multiple (~7-8) chronic demyelinating  plaques from cervico-medullary junction down to C7 level. 2. No acute plaques. 3. No change from MRI on 10/24/14.   11/05/15 MRI brain (with and without) [I reviewed images myself and agree with interpretation. -VRP]  1. Few periventricular and peri-callosal and juxtacortical and medullary chronic demyelinating plaques.  2. No abnormal lesions are seen on post contrast views.  3. No change from MRI on 08/30/14.    01/04/17 MRI brain (with and without) demonstrating: 1. There are a few periventricular, peri-callosal, juxtacortical chronic demyelinating plaques.  2. No acute plaques.  3. Compared to MRI on 11/05/15, there is 1 new left parietal juxtacortical lesion (series 8, image 37).  11/07/17 MRI of the brain with and without contrast shows the following: 1.     T2/FLAIR hyperintense foci in the periventricular, juxtacortical and deep white matter of the hemispheres and in the left thalamus, brain stem and spinal cord in a pattern and configuration consistent with chronic demyelinating plaque associated with multiple sclerosis. None of the foci appears to be acute and, compared to the MRI dated 01/04/2017, there is no interval change. 2.     There is a normal enhancement pattern and there are no acute findings.  Labs - ANA, ANCA, ACE, HIV, RPR - all negative  11/01/14 anti-JCV ab - 1.31 (H) positive    ASSESSMENT AND PLAN  42 y.o. year old female here with multiple focal neurologic attacks since 2008. Neurologic exam demonstrates long tract signs affecting the right arm and right leg more than left side. 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 Other labs negative.   On tecfidera since Feb 2016. Had flare up symptoms in Nov 2016 and Nov-Dec 2017. Also new plaque in Jan 2018. Now on ocrevus since May/June 2018.   Muscle spasms and leg pain (on baclofen, tizanidine and gabapentin).  Fatigue improved with medication regimen adjustment.   Bladder incontinence --> using adult pads and oxybutinin   Depression stable (seeing Dr. Lilian Kapur, [facility]).    Dx:  No diagnosis found.    PLAN:  Multiple sclerosis disease modifying therapy (established problem, stable) - continue ocrevus; doing well on this medication; check CBC, CMP every 6 months - continue multivitamin and vitamin D supplements  RIGHT LEG SWELLING - check RLE u/s (rule out  DVT)  Muscle spasms (established problem, stable) - continue baclofen 20mg  three times per day for muscle spasms - continue tizanidine 4mg  three times per day for muscle spasms  Left leg pain (established problem, stable) - continue gabapentin up to 600mg  three times per day  Bladder incontinence (established problem, improved) - continue oxybutynin 10mg  XL daily - refer to urology  Depression (established problem, worsening) - per Dr. Lilian Kapur [facility]) - continue lexapro  Orders Placed This Encounter  Procedures  . CBC with Differential/Platelet  . Comprehensive metabolic panel  . Immunoglobulins, QN, A/E/G/M  . Ambulatory referral to Urology   Meds ordered this encounter  Medications  . tiZANidine (ZANAFLEX) 4 MG tablet    Sig: Take 1 tablet (4 mg total) by mouth 3 (three) times daily.    Dispense:  90 tablet    Refill:  12  . oxybutynin (DITROPAN-XL) 10 MG 24 hr tablet    Sig: Take 1 tablet (10 mg total) by mouth at bedtime.    Dispense:  30 tablet    Refill:  12  . gabapentin (NEURONTIN) 300 MG capsule    Sig: Take 2 capsules (600 mg total) by mouth 3 (  three) times daily.    Dispense:  180 capsule    Refill:  12  . baclofen (LIORESAL) 10 MG tablet    Sig: Take 2 tablets (20 mg total) by mouth 3 (three) times daily.    Dispense:  180 tablet    Refill:  12   Return in about 6 months (around 10/19/2018).    Suanne Marker, MD 04/18/2018, 3:35 PM Certified in Neurology, Neurophysiology and Neuroimaging  [facility] [address] [place], [place] [address] [phone]

## 2018-04-24 LAB — CBC WITH DIFFERENTIAL/PLATELET
BASOS: 0 %
Basophils Absolute: 0 10*3/uL (ref 0.0–0.2)
EOS (ABSOLUTE): 0.1 10*3/uL (ref 0.0–0.4)
EOS: 2 %
HEMATOCRIT: 39.6 % (ref 34.0–46.6)
Hemoglobin: 13.3 g/dL (ref 11.1–15.9)
IMMATURE GRANS (ABS): 0 10*3/uL (ref 0.0–0.1)
IMMATURE GRANULOCYTES: 0 %
LYMPHS: 31 %
Lymphocytes Absolute: 1.6 10*3/uL (ref 0.7–3.1)
MCH: 31.9 pg (ref 26.6–33.0)
MCHC: 33.6 g/dL (ref 31.5–35.7)
MCV: 95 fL (ref 79–97)
MONOCYTES: 9 %
Monocytes Absolute: 0.4 10*3/uL (ref 0.1–0.9)
NEUTROS PCT: 58 %
Neutrophils Absolute: 2.9 10*3/uL (ref 1.4–7.0)
Platelets: 240 10*3/uL (ref 150–379)
RBC: 4.17 x10E6/uL (ref 3.77–5.28)
RDW: 13.3 % (ref 12.3–15.4)
WBC: 5 10*3/uL (ref 3.4–10.8)

## 2018-04-24 LAB — COMPREHENSIVE METABOLIC PANEL
ALT: 11 IU/L (ref 0–32)
AST: 16 IU/L (ref 0–40)
Albumin/Globulin Ratio: 1.3 (ref 1.2–2.2)
Albumin: 3.9 g/dL (ref 3.5–5.5)
Alkaline Phosphatase: 57 IU/L (ref 39–117)
BUN/Creatinine Ratio: 15 (ref 9–23)
BUN: 16 mg/dL (ref 6–24)
Bilirubin Total: 0.3 mg/dL (ref 0.0–1.2)
CALCIUM: 8.9 mg/dL (ref 8.7–10.2)
CO2: 25 mmol/L (ref 20–29)
CREATININE: 1.05 mg/dL — AB (ref 0.57–1.00)
Chloride: 105 mmol/L (ref 96–106)
GFR, EST AFRICAN AMERICAN: 77 mL/min/{1.73_m2} (ref >59)
GFR, EST NON AFRICAN AMERICAN: 67 mL/min/{1.73_m2} (ref >59)
GLOBULIN, TOTAL: 2.9 g/dL (ref 1.5–4.5)
Glucose: 77 mg/dL (ref 65–99)
Potassium: 4.5 mmol/L (ref 3.5–5.2)
SODIUM: 143 mmol/L (ref 134–144)
TOTAL PROTEIN: 6.8 g/dL (ref 6.0–8.5)

## 2018-04-24 LAB — IMMUNOGLOBULINS A/E/G/M, SERUM
IGA/IMMUNOGLOBULIN A, SERUM: 358 mg/dL — AB (ref 87–352)
IGM (IMMUNOGLOBULIN M), SRM: 77 mg/dL (ref 26–217)
IgE (Immunoglobulin E), Serum: 16 IU/mL (ref 6–495)
IgG (Immunoglobin G), Serum: 1324 mg/dL (ref 700–1600)

## 2018-04-25 ENCOUNTER — Telehealth: Payer: Self-pay | Admitting: *Deleted

## 2018-04-25 NOTE — Telephone Encounter (Signed)
Spoke to pt and relayed that the lab results unremarkable, continue current plan.  She verbalized understanding.

## 2018-04-25 NOTE — Telephone Encounter (Signed)
-----   Message from Suanne Marker, MD sent at 04/25/2018 10:12 AM EDT ----- Unremarkable labs. Continue current plan. Please call patient. -VRP

## 2018-04-26 ENCOUNTER — Ambulatory Visit (HOSPITAL_COMMUNITY)
Admission: RE | Admit: 2018-04-26 | Discharge: 2018-04-26 | Disposition: A | Discharge status: Home / Self Care | Payer: Medicare Other | Source: Ambulatory Visit | Attending: Family | Admitting: Family

## 2018-04-26 DIAGNOSIS — M7989 Other specified soft tissue disorders: Secondary | ICD-10-CM | POA: Diagnosis not present

## 2018-04-29 ENCOUNTER — Telehealth: Payer: Self-pay | Admitting: *Deleted

## 2018-04-29 NOTE — Telephone Encounter (Signed)
Spoke with patient and informed her that her DVT study was unremarkable, no DVT.  Advised Dr Marjory Lies will continue her current plan. She verbalized understanding, appreciation.

## 2018-05-12 ENCOUNTER — Telehealth: Payer: Self-pay | Admitting: *Deleted

## 2018-05-12 NOTE — Telephone Encounter (Signed)
Received notice: Ocrevus SDV 300 mg/ 10 mL shipped 05/10/18.

## 2018-06-28 ENCOUNTER — Telehealth: Payer: Self-pay | Admitting: Diagnostic Neuroimaging

## 2018-06-28 NOTE — Telephone Encounter (Signed)
I spoke to pt and she had US done DVT which was negative.  She has been to see her pcp whom referred her to Dr. Jacinto Halim cardiology for w/u.  She was placed on diet, wearing cardiac monitor- finished today,  has echocardiogram on 07-05-18.   He told pt would be in touch with [name].  I relayed that Dr. Jacinto Halim is good about faxing notes and since referral came from pcp, he would fax notes to her.  She stated that she has bilateral swelling legs, hands, neck feet.  I told her to contact cardiology and let him know.  I would send message to Dr. Marjory Lies.

## 2018-06-28 NOTE — Telephone Encounter (Signed)
Patient says swelling in her feet and legs has gotten worse in the last 2 weeks. She says has seen her PCP and was referred to a cardiologist. The cardiologist put her on a heart monitor which she is still wearing. She has an echocardiogram scheduled for 07-05-18. She is wearing pressure socks and  is not sure what to do. Please call her to discuss.

## 2018-07-06 ENCOUNTER — Telehealth: Payer: Self-pay | Admitting: *Deleted

## 2018-07-14 ENCOUNTER — Other Ambulatory Visit: Payer: Self-pay | Admitting: Cardiology

## 2018-07-14 DIAGNOSIS — I872 Venous insufficiency (chronic) (peripheral): Secondary | ICD-10-CM

## 2018-07-14 DIAGNOSIS — R600 Localized edema: Secondary | ICD-10-CM

## 2018-07-21 NOTE — Telephone Encounter (Signed)
I do not recall what this message was relating to.

## 2018-07-27 ENCOUNTER — Ambulatory Visit
Admission: RE | Admit: 2018-07-27 | Discharge: 2018-07-27 | Disposition: A | Discharge status: Home / Self Care | Payer: Medicare Other | Source: Ambulatory Visit | Attending: Cardiology | Admitting: Cardiology

## 2018-07-27 DIAGNOSIS — R600 Localized edema: Secondary | ICD-10-CM

## 2018-07-27 DIAGNOSIS — I872 Venous insufficiency (chronic) (peripheral): Secondary | ICD-10-CM

## 2018-07-27 NOTE — Consult Note (Signed)
Chief Complaint: Patient was seen in consultation today for No chief complaint on file.  at the request of Ganji,Jay  Referring Physician(s): Ganji,Jay  History of Present Ill2ness: [place] is a 42 y.o. female with a history of multiple sclerosis and disability who presents with a 21-month history of worsening lower extremity edema right greater than left.  This is also associated with discoloration.  She denies any skin breakdown or history of venous insufficiency.  She is gravida 2 para 1 she does wear over-the-counter lower extremity support hose.  She does sit for long periods of time due to her disability.  Past Medical History:  Diagnosis Date  . Anxiety and depression   . Bradycardia   . Multiple sclerosis (HCC)   . Seizures (HCC)    told that they are not from the brain but stress related    Past Surgical History:  Procedure Laterality Date  . CHOLECYSTECTOMY    . KNEE SURGERY      Allergies: Morphine; Orange fruit [citrus]; Oxycodone; Percocet [oxycodone-acetaminophen]; Vicodin [hydrocodone-acetaminophen]; and Other  Medications: Prior to Admission medications   Medication Sig Start Date End Date Taking? Authorizing Provider  baclofen (LIORESAL) 10 MG tablet Take 2 tablets (20 mg total) by mouth 3 (three) times daily. 04/18/18  Yes Penumalli, Glenford Bayley, MD  cholecalciferol (VITAMIN D) 1000 UNITS tablet Take 1,000 Units by mouth daily. Take one a day for 6-12 months and we will recheck your levels when you come back in the office in 6 months   Yes [provider]  diphenhydrAMINE (BENADRYL) 25 mg capsule Take 25 mg by mouth every 6 (six) hours as needed. allergies   Yes [provider]  escitalopram (LEXAPRO) 20 MG tablet Take 20 mg by mouth. 04/07/16  Yes [provider]  gabapentin (NEURONTIN) 300 MG capsule Take 2 capsules (600 mg total) by mouth 3 (three) times daily. 04/18/18  Yes Penumalli, Glenford Bayley, MD  LORazepam  (ATIVAN) 1 MG tablet Take 1 mg by mouth daily.   Yes [provider]  Multiple Vitamins-Minerals (MULTIVITAMIN ADULT PO) Take 1 tablet by mouth daily.   Yes [provider]  Ocrelizumab (OCREVUS IV) Inject into the vein. Every 6 months   Yes [provider]  spironolactone (ALDACTONE) 25 MG tablet Take 25 mg by mouth daily.   Yes [provider]  tiZANidine (ZANAFLEX) 4 MG tablet Take 1 tablet (4 mg total) by mouth 3 (three) times daily. 04/18/18  Yes Penumalli, Glenford Bayley, MD  oxybutynin (DITROPAN-XL) 10 MG 24 hr tablet Take 1 tablet (10 mg total) by mouth at bedtime. Patient not taking: Reported on 07/27/2018 04/18/18   Suanne Marker, MD     Family History  Problem Relation Age of Onset  . Hypertension Mother   . Atrial fibrillation Mother   . Hypertension Father   . Atrial fibrillation Maternal Grandmother     Social History   Socioeconomic History  . Marital status: Single    Spouse name: Not on file  . Number of children: 1  . Years of education: College  . Highest education level: Not on file  Occupational History    Employer: OTHER    Comment: disabled  Social Needs  . Financial resource strain: Not on file  . Food insecurity:    Worry: Not on file    Inability: Not on file  . Transportation needs:    Medical: Not on file    Non-medical: Not on file  Tobacco Use  . Smoking status: Former Smoker    Packs/day: 0.35    Years: 3.00    Pack years: 1.05    Types: Cigarettes    Last attempt to quit: 12/07/2006    Years since quitting: 11.6  . Smokeless tobacco: Never Used  Substance and Sexual Activity  . Alcohol use: No    Alcohol/week: 0.0 standard drinks    Comment: quit: 2013 (socially)  . Drug use: Yes    Types: Marijuana    Comment: quit: 2001   . Sexual activity: Not on file  Lifestyle  . Physical activity:    Days per week: Not on file    Minutes per session: Not on file  . Stress: Not on file  Relationships  .  Social connections:    Talks on phone: Not on file    Gets together: Not on file    Attends religious service: Not on file    Active member of club or organization: Not on file    Attends meetings of clubs or organizations: Not on file    Relationship status: Not on file  Other Topics Concern  . Not on file  Social History Narrative   Patient lives at home with family.   Caffeine Use: drinks a cup of hot tea with tecfidera in the am   Patient is right handed.   Patient has a college education     Review of Systems: A 12 point ROS discussed and pertinent positives are indicated in the HPI above.  All other systems are negative.  Review of Systems  Vital Signs: BP 119/60   Pulse (!) 59   Temp 98 F (36.7 C) (Oral)   Resp 17   Ht 5\' 8"  (1.727 m)   Wt 106.6 kg   LMP 07/21/2018 (Exact Date)   SpO2 98%   BMI 35.73 kg/m   Physical Exam  Examination of her lower extremity demonstrates 2+ edema at the right ankle and 1+ edema at the left ankle.  Dorsalis pedis pulses are nonpalpable.  Posterior tibial pulses are 1+ bilaterally.  There is no skin breakdown or varicosities.  No prominent discoloration.   Imaging: A venous Doppler study performed today demonstrates no evidence of DVT or venous insufficiency.   Labs:  CBC: Recent Labs    10/18/17 1017 04/18/18 1635  WBC 3.5 5.0  HGB 13.0 13.3  HCT 39.1 39.6  PLT 234 240    COAGS: No results for input(s): INR, APTT in the last 8760 hours.  BMP: Recent Labs    10/18/17 1017 04/18/18 1635  NA 143 143  K 4.7 4.5  CL 104 105  CO2 26 25  GLUCOSE 86 77  BUN 14 16  CALCIUM 9.1 8.9  CREATININE 0.94 1.05*  GFRNONAA 76 67  GFRAA 88 77    LIVER FUNCTION TESTS: Recent Labs    10/18/17 1017 04/18/18 1635  BILITOT 0.3 0.3  AST 17 16  ALT 11 11  ALKPHOS 55 57  PROT 6.4 6.8  ALBUMIN 3.8 3.9    TUMOR MARKERS: No results for input(s): AFPTM, CEA, CA199, CHROMGRNA in the last 8760 hours.  Assessment and  Plan:  This patient has no evidence of lower extremity venous insufficiency by Doppler imaging.  There is no evidence of DVT.  The lower extremity edema may be related to her neurological condition or possibly related to salt intake.  I have instructed her to continue with the over-the-counter compression stockings.  No treatment  regarding venous insufficiency is indicated at this time.  Thank you for this interesting consult.  I greatly enjoyed meeting Tomasita Beevers and look forward to participating in their care.  A copy of this report was sent to the requesting provider on this date.  Electronically Signed: Kaveon Blatz, ART A 07/27/2018, 10:31 AM   I spent a total of  40 Minutes   in face to face in clinical consultation, greater than 50% of which was counseling/coordinating care for lower extremity venous insufficiency.

## 2018-10-07 DIAGNOSIS — F411 Generalized anxiety disorder: Secondary | ICD-10-CM | POA: Insufficient documentation

## 2018-10-25 ENCOUNTER — Encounter: Payer: Self-pay | Admitting: Diagnostic Neuroimaging

## 2018-10-25 ENCOUNTER — Ambulatory Visit (INDEPENDENT_AMBULATORY_CARE_PROVIDER_SITE_OTHER): Payer: Medicare Other | Admitting: Diagnostic Neuroimaging

## 2018-10-25 ENCOUNTER — Telehealth: Payer: Self-pay | Admitting: Diagnostic Neuroimaging

## 2018-10-25 VITALS — BP 109/72 | HR 64 | Ht 68.0 in | Wt 245.4 lb

## 2018-10-25 DIAGNOSIS — M62838 Other muscle spasm: Secondary | ICD-10-CM | POA: Diagnosis not present

## 2018-10-25 DIAGNOSIS — R413 Other amnesia: Secondary | ICD-10-CM

## 2018-10-25 DIAGNOSIS — R5383 Other fatigue: Secondary | ICD-10-CM | POA: Diagnosis not present

## 2018-10-25 DIAGNOSIS — G35 Multiple sclerosis: Secondary | ICD-10-CM | POA: Diagnosis not present

## 2018-10-25 MED ORDER — GABAPENTIN 300 MG PO CAPS: 600.0000 mg | ORAL_CAPSULE | Freq: Three times a day (TID) | ORAL | 12 refills | Status: DC

## 2018-10-25 MED ORDER — TIZANIDINE HCL 4 MG PO TABS: 4.0000 mg | ORAL_TABLET | Freq: Three times a day (TID) | ORAL | 12 refills | Status: DC

## 2018-10-25 MED ORDER — BACLOFEN 10 MG PO TABS: 20.0000 mg | ORAL_TABLET | Freq: Three times a day (TID) | ORAL | 12 refills | Status: DC

## 2018-10-25 NOTE — Telephone Encounter (Signed)
Patient is aware and I gave her [facility] phone number of [phone] and if she has not heard from them in the next 2-3 business days to give them a call.

## 2018-10-25 NOTE — Telephone Encounter (Signed)
medicare/medicaid order sent to GI. They  will reach out to the pt to schedule.  °

## 2018-10-25 NOTE — Progress Notes (Signed)
[facility]  PATIENT: [place] DOB: [birth year]  REFERRING CLINICIAN: Williams  HISTORY FROM: patient  REASON FOR VISIT: follow up   HISTORICAL  CHIEF COMPLAINT:  Chief Complaint  Patient presents with  . Follow-up    Rm 7,   . Multiple Sclerosis    HISTORY OF PRESENT ILLNESS:   UPDATE (10/25/18, VRP): Since last visit, doing about the same. Symptoms are stable EXCEPT right hand weakness and coordination is worse. Some right shoulder twitching. Memory loss continues. No alleviating or aggravating factors. Tolerating meds.    UPDATE (04/18/18, VRP): Since last visit, doing well on ocrevus, except had another fall. Recently more urine incont, and right leg swelling. Tolerating meds. No alleviating or aggravating factors.   UPDATE (10/18/17, VRP): Since last visit, doing well. Tolerating ocrevus. No alleviating or aggravating factors. No new events. C/o cough and dry mouth issues.   UPDATE 05/19/17: Since last visit, now transitioned to ocrevus (May 21, June 4). Did well with infusion. Spasms stable. Left leg slightly better. Bladder slightly better. Depression stable. Using rollator walker. No other triggering factors. Overall doing well.   UPDATE 02/08/17: Since last visit, MRI brain shows 1 new lesion. Here to discuss ocrevus. Also with more left leg pain, muscle spasms, gait diff, fatigue. Right ear pain better. Bladder incont slightly better with oxybutinin initially, but not anymore.   UPDATE 12/22/16: Since last visit, was doing well until Nov 2017 and then developed stabbing right ear pain (continues to present). Also with new and increasing spasm and cramps in right hand and left leg since Dec 2017. Continues with fatigue and bladder incontinence.  UPDATE 05/13/16: Since last visit, doing well. No new MS events or symptoms. In retrospect, may have lost vision in right eye (2015, and possibly March 2016), lasting 3-7 days. Didn't mention to me  until now. Recently this was noted optometry and ophthalmology exams.   UPDATE 01/27/16: Since last visit, fatigue improved with adjusting medication timing. Bladder issues stable. Had UTI at last visit, tx'd with abx, now better.  UPDATE 10/22/15: Since last visit was doing well. Then last week had new onset weakness in legs, bladder incontinence, strong smelling urine, and hand tremors. Now sxs almost fully resolved since yesterday.  UPDATE 05/01/15: Symptoms stable. Tolerating tecfidera.  UPDATE 01/25/15: Since last visit, had more problems for a few weeks (lower ext weakness, bladder incont), but now improved. Now on tecfidera x 3 weeks. Some itching and stomach issues with tecfidera, but mild. Main issues now consist of lower extremity pain, spasms, urinary leakage, daytime fatigue.  UPDATE 10/31/14: Since last visit, sxs are stable. Test results and diagnosis reviewed. No new events. Using cane to walk.  UPDATE (10/02/14): 42 year old right-handed female here for evaluation of double vision. 2008 patient was 6 months pregnant with her son, when all of a sudden she didn't feel good. She noticed right hand clumsiness and incoordination. This lasted for approximately 9 months and then stopped. She did not seek medical attention for this problem. 2013 patient had onset of right arm weakness, right leg weakness, numbness on the right side, intermittent shaking sensation all over. Patient was evaluated in [place] and diagnosed with possible stroke. 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 Patient did not recover right arm or leg strength. Her speech was also affected. October 2014 patient moved to [place]. 08/30/2014 patient was at home hanging clothes over the bathtub when she fell down. She is not sure what caused her fall. Her right leg may  have given out. Patient fell and struck her head against the soap dish which broke off. Patient did not lose consciousness. Patient's family advised her  to go to the emergency room. Patient was evaluated with CT and MRI of the brain. No acute findings were found. MRI of the brain did show multiple white matter lesions suspicious for chronic multiple sclerosis versus chronic small vessel ischemic disease. Advised to follow-up in outpatient basis. Since this fall patient has noticed some additional blurred vision, and double vision especially when looking to the left side.   REVIEW OF SYSTEMS: Full 14 system review of systems performed and negative except for: memory loss dizziness confusion depression anxiety chills.   ALLERGIES: Allergies  Allergen Reactions  . Morphine Itching  . Orange Fruit [Citrus]     Acidic foods Makes her itchy and her face "feels like its on fire", have rash.  . Oxycodone Itching  . Percocet [Oxycodone-Acetaminophen]     Stomach cramps   . Vicodin [Hydrocodone-Acetaminophen]     Stomach cramps   . Other Rash    Narcotics. Spicy foods (cause seizures)    HOME MEDICATIONS: Outpatient Medications Prior to Visit  Medication Sig Dispense Refill  . baclofen (LIORESAL) 10 MG tablet Take 2 tablets (20 mg total) by mouth 3 (three) times daily. 180 tablet 12  . cholecalciferol (VITAMIN D) 1000 UNITS tablet Take 1,000 Units by mouth daily. Take one a day for 6-12 months and we will recheck your levels when you come back in the office in 6 months    . diphenhydrAMINE (BENADRYL) 25 mg capsule Take 25 mg by mouth every 6 (six) hours as needed. allergies    . escitalopram (LEXAPRO) 20 MG tablet Take 20 mg by mouth.    . gabapentin (NEURONTIN) 300 MG capsule Take 2 capsules (600 mg total) by mouth 3 (three) times daily. 180 capsule 12  . LORazepam (ATIVAN) 1 MG tablet Take 1 mg by mouth daily.    . Multiple Vitamins-Minerals (MULTIVITAMIN ADULT PO) Take 1 tablet by mouth daily.    Fran [employer] (OCREVUS IV) Inject into the vein. Every 6 months    . oxybutynin (DITROPAN-XL) 10 MG 24 hr tablet Take 1 tablet (10 mg total) by  mouth at bedtime. (Patient not taking: Reported on 07/27/2018) 30 tablet 12  . spironolactone (ALDACTONE) 25 MG tablet Take 25 mg by mouth daily.    Marland Kitchen tiZANidine (ZANAFLEX) 4 MG tablet Take 1 tablet (4 mg total) by mouth 3 (three) times daily. 90 tablet 12   No facility-administered medications prior to visit.     PAST MEDICAL HISTORY: Past Medical History:  Diagnosis Date  . Anxiety and depression   . Bradycardia   . Multiple sclerosis (HCC)   . Seizures (HCC)    told that they are not from the brain but stress related    PAST SURGICAL HISTORY: Past Surgical History:  Procedure Laterality Date  . CHOLECYSTECTOMY    . KNEE SURGERY      FAMILY HISTORY: Family History  Problem Relation Age of Onset  . Hypertension Mother   . Atrial fibrillation Mother   . Hypertension Father   . Atrial fibrillation Maternal Grandmother     SOCIAL HISTORY:  Social History   Socioeconomic History  . Marital status: Single    Spouse name: Not on file  . Number of children: 1  . Years of education: College  . Highest education level: Not on file  Occupational History    Employer:  OTHER    Comment: disabled  Social Needs  . Financial resource strain: Not on file  . Food insecurity:    Worry: Not on file    Inability: Not on file  . Transportation needs:    Medical: Not on file    Non-medical: Not on file  Tobacco Use  . Smoking status: Former Smoker    Packs/day: 0.35    Years: 3.00    Pack years: 1.05    Types: Cigarettes    Last attempt to quit: 12/07/2006    Years since quitting: 11.8  . Smokeless tobacco: Never Used  Substance and Sexual Activity  . Alcohol use: No    Alcohol/week: 0.0 standard drinks    Comment: quit: 2013 (socially)  . Drug use: Yes    Types: Marijuana    Comment: quit: 2001   . Sexual activity: Not on file  Lifestyle  . Physical activity:    Days per week: Not on file    Minutes per session: Not on file  . Stress: Not on file  Relationships  .  Social connections:    Talks on phone: Not on file    Gets together: Not on file    Attends religious service: Not on file    Active member of club or organization: Not on file    Attends meetings of clubs or organizations: Not on file    Relationship status: Not on file  . Intimate partner violence:    Fear of current or ex partner: Not on file    Emotionally abused: Not on file    Physically abused: Not on file    Forced sexual activity: Not on file  Other Topics Concern  . Not on file  Social History Narrative   Patient lives at home with family.   Caffeine Use: drinks a cup of hot tea with tecfidera in the am   Patient is right handed.   Patient has a college education     PHYSICAL EXAM  There were no vitals filed for this visit. No exam data present   There is no height or weight on file to calculate BMI.  GENERAL EXAM: Patient is in no distress; well developed, nourished and groomed; neck is supple  CARDIOVASCULAR: Regular rate and rhythm, no murmurs, no carotid bruits  NEUROLOGIC: MENTAL STATUS: awake, alert, language fluent, comprehension intact, naming intact, fund of knowledge appropriate; SLOW SCANNING SPEECH PATTERN CRANIAL NERVE: PUPILS PINPOINT AND REACTIVE; visual fields full to confrontation, extraocular muscles intact; NO PTOSIS; facial sensation symmetric, FACE --> DEC LEFT NL FOLD, hearing intact, palate elevates symmetrically, uvula midline, shoulder shrug symmetric, tongue midline. MOTOR: INCREASED TONE IN RIGHT ARM; SLIGHT INCR TONE IN LUE; RUE 4 PROX AND 3 DISTAL; RLE 2 PROX AND 3 DISTAL; LUE NORMAL; LLE 5 SENSORY: DECR IN RIGHT HAND AND RIGHT FOOT TO ALL MODALITIES COORDINATION: BUE DYSMETRIA; SLOW FINGER AND FOOT TAP ON RIGHT REFLEXES: BUE 2; BLE KNEES 3, ANKLES 2 GAIT/STATION: SPASTIC, HEMIPARETIC GAIT WITH RIGHT LEG WEAKNESS/STIFFNESS; UNSTEADY; USING ROLLATOR.    DIAGNOSTIC DATA (LABS, IMAGING, TESTING) - I reviewed patient records, labs,  notes, testing and imaging myself where available.  Lab Results  Component Value Date   WBC 5.0 04/18/2018   HGB 13.3 04/18/2018   HCT 39.6 04/18/2018   MCV 95 04/18/2018   PLT 240 04/18/2018      Component Value Date/Time   NA 143 04/18/2018 1635   K 4.5 04/18/2018 1635   CL 105 04/18/2018  1635   CO2 25 04/18/2018 1635   GLUCOSE 77 04/18/2018 1635   GLUCOSE 91 08/30/2014 1031   BUN 16 04/18/2018 1635   CREATININE 1.05 (H) 04/18/2018 1635   CALCIUM 8.9 04/18/2018 1635   PROT 6.8 04/18/2018 1635   ALBUMIN 3.9 04/18/2018 1635   AST 16 04/18/2018 1635   ALT 11 04/18/2018 1635   ALKPHOS 57 04/18/2018 1635   BILITOT 0.3 04/18/2018 1635   GFRNONAA 67 04/18/2018 1635   GFRAA 77 04/18/2018 1635   No results found for: CHOL No results found for: HGBA1C No results found for: VITAMINB12 No results found for: TSH  Vit D, 25-Hydroxy  Date Value Ref Range Status  12/22/2016 34.1 30.0 - 100.0 ng/mL Final    Comment:    Vitamin D deficiency has been defined by the Institute of Medicine and an Endocrine Society practice guideline as a level of serum 25-OH vitamin D less than 20 ng/mL (1,2). The Endocrine Society went on to further define vitamin D insufficiency as a level between 21 and 29 ng/mL (2). 1. IOM (Institute of Medicine). 2010. Dietary reference    intakes for calcium and D. [place]: The    [employer]. 2. Holick MF, Binkley [place], Bischoff-Ferrari HA, et al.    Evaluation, treatment, and prevention of vitamin D    deficiency: an Endocrine Society clinical practice    guideline. JCEM. 2011 Jul; 96(7):1911-30.   10/22/2015 35.7 30.0 - 100.0 ng/mL Final    Comment:    Vitamin D deficiency has been defined by the Institute of Medicine and an Endocrine Society practice guideline as a level of serum 25-OH vitamin D less than 20 ng/mL (1,2). The Endocrine Society went on to further define vitamin D insufficiency as a level between 21 and 29 ng/mL (2). 1.  IOM (Institute of Medicine). 2010. Dietary reference    intakes for calcium and D. [place]: The    [employer]. 2. Holick MF, Binkley [place], Bischoff-Ferrari HA, et al.    Evaluation, treatment, and prevention of vitamin D    deficiency: an Endocrine Society clinical practice    guideline. JCEM. 2011 Jul; 96(7):1911-30.   05/01/2015 25.3 (L) 30.0 - 100.0 ng/mL Final    Comment:    Vitamin D deficiency has been defined by the Institute of Medicine and an Endocrine Society practice guideline as a level of serum 25-OH vitamin D less than 20 ng/mL (1,2). The Endocrine Society went on to further define vitamin D insufficiency as a level between 21 and 29 ng/mL (2). 1. IOM (Institute of Medicine). 2010. Dietary reference    intakes for calcium and D. [place]: The    [employer]. 2. Holick MF, Binkley [place], Bischoff-Ferrari HA, et al.    Evaluation, treatment, and prevention of vitamin D    deficiency: an Endocrine Society clinical practice    guideline. JCEM. 2011 Jul; 96(7):1911-30.    Lymphocytes Absolute  Date Value Ref Range Status  04/18/2018 1.6 0.7 - 3.1 x10E3/uL Final  10/18/2017 1.2 0.7 - 3.1 x10E3/uL Final  02/08/2017 1.7 0.7 - 3.1 x10E3/uL Final    11/05/15 MRI cervical spine (with and without) [I reviewed images myself and agree with interpretation. -VRP]  1. Multiple (~7-8) chronic demyelinating plaques from cervico-medullary junction down to C7 level. 2. No acute plaques. 3. No change from MRI on 10/24/14.   11/05/15 MRI brain (with and without) [I reviewed images myself and agree with interpretation. -VRP]  1. Few periventricular  and peri-callosal and juxtacortical and medullary chronic demyelinating plaques.  2. No abnormal lesions are seen on post contrast views.  3. No change from MRI on 08/30/14.   01/04/17 MRI brain (with and without) demonstrating: 1. There are a few periventricular, peri-callosal, juxtacortical chronic  demyelinating plaques.  2. No acute plaques.  3. Compared to MRI on 11/05/15, there is 1 new left parietal juxtacortical lesion (series 8, image 37).  11/07/17 MRI of the brain with and without contrast shows the following: 1.     T2/FLAIR hyperintense foci in the periventricular, juxtacortical and deep white matter of the hemispheres and in the left thalamus, brain stem and spinal cord in a pattern and configuration consistent with chronic demyelinating plaque associated with multiple sclerosis. None of the foci appears to be acute and, compared to the MRI dated 01/04/2017, there is no interval change. 2.     There is a normal enhancement pattern and there are no acute findings.  Labs - ANA, ANCA, ACE, HIV, RPR - all negative  11/01/14 anti-JCV ab - 1.31 (H) positive  07/27/18 RLE u/s - No evidence of DVT and no evidence of venous insufficiency.    ASSESSMENT AND PLAN  42 y.o. year old female here with multiple focal neurologic attacks since 2008. Neurologic exam demonstrates long tract signs affecting the right arm and right leg more than left side. 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 Other labs negative.   On tecfidera since Feb 2016. Had flare up symptoms in Nov 2016 and Nov-Dec 2017. Also new plaque in Jan 2018. Now on ocrevus since May/June 2018.   Muscle spasms and leg pain (on baclofen, tizanidine and gabapentin).  Fatigue improved with medication regimen adjustment.   Bladder incontinence --> using adult pads and oxybutinin   Depression stable (seeing Dr. Lilian Kapur, [place]).    Dx:  Multiple sclerosis (HCC) - Plan: MR BRAIN W WO CONTRAST, CBC with Differential/Platelet, Comprehensive metabolic panel  Muscle spasm  Other fatigue  Memory loss    PLAN:  Multiple sclerosis disease modifying therapy (established problem, stable) - continue ocrevus; doing well on this medication; check CBC, CMP every 6 months -  continue multivitamin and vitamin D supplements  MEMORY LOSS - optimize nutrition and exercise - follow up with psychiatry (optimize depression / anxiety treatments; consider caffeine vs stimulant meds) - safety / supervision issues reviewed - caution with driving and finances  RIGHT LEG SWELLING - improved with compression stockings  Muscle spasms (established problem, stable) - continue baclofen 20mg  three times per day for muscle spasms - continue tizanidine 4mg  three times per day for muscle spasms  Left leg pain (established problem, stable) - continue gabapentin up to 600mg  three times per day  Bladder incontinence (established problem, improved) - consider follow up with refer to urology (tried and failed oxybutynin)  Depression / anxiety (established problem, stable) - per Dr. Lilian Kapur [facility]) - continue lexapro  Orders Placed This Encounter  Procedures  . MR BRAIN W WO CONTRAST  . MR CERVICAL SPINE W WO CONTRAST  . CBC with Differential/Platelet  . Comprehensive metabolic panel   Meds ordered this encounter  Medications  . tiZANidine (ZANAFLEX) 4 MG tablet    Sig: Take 1 tablet (4 mg total) by mouth 3 (three) times daily.    Dispense:  90 tablet    Refill:  12  . gabapentin (NEURONTIN) 300 MG capsule    Sig: Take 2 capsules (600 mg total) by  mouth 3 (three) times daily.    Dispense:  180 capsule    Refill:  12  . baclofen (LIORESAL) 10 MG tablet    Sig: Take 2 tablets (20 mg total) by mouth 3 (three) times daily.    Dispense:  180 tablet    Refill:  12   Return in about 6 months (around 04/25/2019).    Suanne Marker, MD 10/25/2018, 11:25 AM Certified in Neurology, Neurophysiology and Neuroimaging  [facility] [address] [place], [place] [address] [phone]

## 2018-10-26 LAB — CBC WITH DIFFERENTIAL/PLATELET
BASOS ABS: 0 10*3/uL (ref 0.0–0.2)
Basos: 1 %
EOS (ABSOLUTE): 0.1 10*3/uL (ref 0.0–0.4)
Eos: 1 %
Hematocrit: 40.9 % (ref 34.0–46.6)
Hemoglobin: 13.4 g/dL (ref 11.1–15.9)
Immature Grans (Abs): 0 10*3/uL (ref 0.0–0.1)
Immature Granulocytes: 0 %
LYMPHS: 33 %
Lymphocytes Absolute: 1.4 10*3/uL (ref 0.7–3.1)
MCH: 30.7 pg (ref 26.6–33.0)
MCHC: 32.8 g/dL (ref 31.5–35.7)
MCV: 94 fL (ref 79–97)
Monocytes Absolute: 0.4 10*3/uL (ref 0.1–0.9)
Monocytes: 9 %
NEUTROS ABS: 2.5 10*3/uL (ref 1.4–7.0)
NEUTROS PCT: 56 %
PLATELETS: 246 10*3/uL (ref 150–450)
RBC: 4.37 x10E6/uL (ref 3.77–5.28)
RDW: 11.9 % — AB (ref 12.3–15.4)
WBC: 4.4 10*3/uL (ref 3.4–10.8)

## 2018-10-26 LAB — COMPREHENSIVE METABOLIC PANEL
ALK PHOS: 59 IU/L (ref 39–117)
ALT: 9 IU/L (ref 0–32)
AST: 17 IU/L (ref 0–40)
Albumin/Globulin Ratio: 1.5 (ref 1.2–2.2)
Albumin: 4.1 g/dL (ref 3.5–5.5)
BILIRUBIN TOTAL: 0.5 mg/dL (ref 0.0–1.2)
BUN/Creatinine Ratio: 13 (ref 9–23)
BUN: 10 mg/dL (ref 6–24)
CO2: 26 mmol/L (ref 20–29)
Calcium: 9.5 mg/dL (ref 8.7–10.2)
Chloride: 101 mmol/L (ref 96–106)
Creatinine, Ser: 0.76 mg/dL (ref 0.57–1.00)
GFR calc Af Amer: 113 mL/min/{1.73_m2} (ref >59)
GFR calc non Af Amer: 98 mL/min/{1.73_m2} (ref >59)
GLUCOSE: 77 mg/dL (ref 65–99)
Globulin, Total: 2.8 g/dL (ref 1.5–4.5)
Potassium: 4.3 mmol/L (ref 3.5–5.2)
Sodium: 140 mmol/L (ref 134–144)
TOTAL PROTEIN: 6.9 g/dL (ref 6.0–8.5)

## 2018-10-27 ENCOUNTER — Telehealth: Payer: Self-pay | Admitting: *Deleted

## 2018-10-27 NOTE — Telephone Encounter (Signed)
-----   Message from Suanne Marker, MD sent at 10/27/2018 11:45 AM EST ----- Unremarkable labs. Continue current plan. Please call patient. -VRP

## 2018-10-27 NOTE — Telephone Encounter (Signed)
I spoke to pt and relayed that her labs are unremarkable, (meaning not totally normal) her RDW (a rbc) was abnormal, but nothing that required additional labs and per Dr. Marjory Lies unremarkable.    She verbalized understanding.

## 2018-10-30 ENCOUNTER — Other Ambulatory Visit: Payer: Self-pay | Admitting: Diagnostic Neuroimaging

## 2018-11-19 ENCOUNTER — Ambulatory Visit
Admission: RE | Admit: 2018-11-19 | Discharge: 2018-11-19 | Disposition: A | Discharge status: Home / Self Care | Payer: Medicare Other | Source: Ambulatory Visit | Attending: Diagnostic Neuroimaging | Admitting: Diagnostic Neuroimaging

## 2018-11-19 DIAGNOSIS — G35 Multiple sclerosis: Secondary | ICD-10-CM | POA: Diagnosis not present

## 2018-11-19 MED ORDER — GADOBENATE DIMEGLUMINE 529 MG/ML IV SOLN
20.0000 mL | Freq: Once | INTRAVENOUS | Status: AC | PRN
  Administered 2018-11-19: 20 mL via INTRAVENOUS

## 2018-11-21 ENCOUNTER — Telehealth: Payer: Self-pay | Admitting: *Deleted

## 2018-11-21 NOTE — Telephone Encounter (Signed)
Received [facility] request [facility] re: Emogene Morgan reorder. Placed on Dr [employer] desk for completion, signature.

## 2018-11-22 ENCOUNTER — Telehealth: Payer: Self-pay | Admitting: Diagnostic Neuroimaging

## 2018-11-22 NOTE — Telephone Encounter (Addendum)
Received call from [facility] requesting verbal Rx for Ocrevus. This RN advised her that Dr Marjory Lies signed the refill request received through fax today. This RN advised will fax now. Mick Sell verbalized understanding, appreciation.  This RN informed infusion RN that the patient has sent my chart message and asked about her next Ocrevus infusion.

## 2018-11-22 NOTE — Telephone Encounter (Signed)
FYI  Pt called and was connected to the infusion suite

## 2018-11-22 NOTE — Telephone Encounter (Signed)
See other phone note dated today. Refill Rx completed, signed and faxed back to [facility].

## 2018-11-25 ENCOUNTER — Telehealth: Payer: Self-pay | Admitting: *Deleted

## 2018-11-25 NOTE — Telephone Encounter (Signed)
Received my chart asking about Ocrevus infusion appointment. This RN spoke with Laney Pastor in infusion suite. Judeth Cornfield asked for most recent office note and MRI reports, stated she would fax over to [facility] and try to push authorization through. She stated to offer patient a possible infusion appt on 12/26 or jan 2nd. This RN called patient who stated she can come Dec 23rd or 26th. Otherwise she'll be out of town and can come any time after Jan 2nd. This RN apologized for the delay and advised her will let Sydne know. She verbalized understanding, appreciation , stated she "feels like she is falling apart" and wants infusion as soon as possible. Her last infusion was 05/26/18. This RN gave MRIs, office notes to [facility] who faxed them immediately. Also gave Judeth Cornfield the patient's reply re: appointments for Ocrevus. Tonianne verbalized understanding.

## 2018-11-25 NOTE — Telephone Encounter (Signed)
Spoke with patient and informed her MRI brain and MRI cervical spine are both stable from previous scans. She  verbalized understanding, appreciation.

## 2019-04-10 ENCOUNTER — Telehealth: Payer: Self-pay | Admitting: *Deleted

## 2019-04-10 NOTE — Telephone Encounter (Signed)
Spoke with patient and advised her that due to current COVID 19 pandemic, our office is severely reducing in person visits in order to minimize the risk to our patients and healthcare providers. We recommend to convert your appointment to a video visit. We'll take all precautions to reduce any security or privacy concerns. This will be treated like an office visit, and we will file with your insurance.  She consented to [email or web address] visit. Her e mail is [email or web address]. Updated EMR. She  verbalized understanding, appreciation. E mail sent.

## 2019-04-12 ENCOUNTER — Other Ambulatory Visit: Payer: Self-pay

## 2019-04-12 ENCOUNTER — Encounter: Payer: Self-pay | Admitting: Diagnostic Neuroimaging

## 2019-04-12 ENCOUNTER — Other Ambulatory Visit: Payer: Self-pay | Admitting: *Deleted

## 2019-04-12 ENCOUNTER — Ambulatory Visit (INDEPENDENT_AMBULATORY_CARE_PROVIDER_SITE_OTHER): Payer: Medicare Other | Admitting: Diagnostic Neuroimaging

## 2019-04-12 DIAGNOSIS — M62838 Other muscle spasm: Secondary | ICD-10-CM | POA: Diagnosis not present

## 2019-04-12 DIAGNOSIS — R5383 Other fatigue: Secondary | ICD-10-CM | POA: Diagnosis not present

## 2019-04-12 DIAGNOSIS — G35 Multiple sclerosis: Secondary | ICD-10-CM

## 2019-04-12 MED ORDER — GABAPENTIN 300 MG PO CAPS: 600.0000 mg | ORAL_CAPSULE | Freq: Three times a day (TID) | ORAL | 12 refills | Status: DC

## 2019-04-12 MED ORDER — TIZANIDINE HCL 4 MG PO TABS: 4.0000 mg | ORAL_TABLET | Freq: Three times a day (TID) | ORAL | 12 refills | Status: DC

## 2019-04-12 MED ORDER — BACLOFEN 10 MG PO TABS: 20.0000 mg | ORAL_TABLET | Freq: Three times a day (TID) | ORAL | 12 refills | Status: DC

## 2019-04-12 NOTE — Progress Notes (Signed)
Virtual Visit via Video Note  I connected with Ebony Warren on 04/12/19 at  2:00 PM EDT by a video enabled telemedicine application and verified that I am speaking with the correct person using two identifiers.   I discussed the limitations of evaluation and management by telemedicine and the availability of in person appointments. The patient expressed understanding and agreed to proceed.  Patient is at their home. I am at the office.    History of Present Illness:  - overall feel well; sxs are stable - tolerating ocrevus (next infusion in June 2020) - had 1 week of leg weakness in April, now resolved    Observations/Objective:   VIDEO EXAM  GENERAL EXAM/CONSTITUTIONAL:  Vitals: There were no vitals filed for this visit.  There is no height or weight on file to calculate BMI. Wt Readings from Last 3 Encounters:  10/25/18 245 lb 6.4 oz (111.3 kg)  07/27/18 235 lb (106.6 kg)  04/18/18 235 lb 9.6 oz (106.9 kg)     Patient is in no distress; well developed, nourished and groomed; neck is supple   NEUROLOGIC: MENTAL STATUS:  MMSE - Mini Mental State Exam 10/25/2018  Orientation to time 5  Orientation to Place 5  Registration 3  Attention/ Calculation 4  Recall 1  Language- name 2 objects 2  Language- repeat 1  Language- follow 3 step command 3  Language- read & follow direction 1  Write a sentence 1  Copy design 1  Total score 27    awake, alert, oriented to person, place and time  recent and remote memory intact  normal attention and concentration  language fluent, comprehension intact, naming intact  fund of knowledge appropriate  CRANIAL NERVE:   2nd, 3rd, 4th, 6th - visual fields full to confrontation, extraocular muscles intact, no nystagmus  5th - facial sensation symmetric  7th - facial strength symmetric  8th - hearing intact  11th - shoulder shrug symmetric  12th - tongue protrusion midline  MOTOR:   NO TREMOR; NO  DRIFT IN BUE  SENSORY:   normal and symmetric to light touch  COORDINATION:   fine finger movements --> SLOW MOVEMENTS ON RIGHT SIDE     Assessment and Plan:   Multiple sclerosis disease modifying therapy (established problem, stable) - continue ocrevus; doing well on this medication; check CBC, CMP every 6 months - continue multivitamin and vitamin D supplements  Orders Placed This Encounter  Procedures  . CBC with Differential/Platelet  . Comprehensive metabolic panel     MEMORY LOSS - optimize nutrition and exercise - follow up with psychiatry (optimize depression / anxiety treatments; consider caffeine vs stimulant meds) - safety / supervision issues reviewed - caution with driving and finances  Depression / anxiety (established problem, IMPROVED) - per Dr. Lilian Kapur [facility]) - continue lexapro  Muscle spasms (established problem, stable) - continue baclofen 20mg  three times per day for muscle spasms - continue tizanidine 4mg  three times per day for muscle spasms  Meds ordered this encounter  Medications  . baclofen (LIORESAL) 10 MG tablet    Sig: Take 2 tablets (20 mg total) by mouth 3 (three) times daily.    Dispense:  180 tablet    Refill:  12  . gabapentin (NEURONTIN) 300 MG capsule    Sig: Take 2 capsules (600 mg total) by mouth 3 (three) times daily.    Dispense:  180 capsule    Refill:  12  . tiZANidine (ZANAFLEX) 4 MG tablet  Sig: Take 1 tablet (4 mg total) by mouth 3 (three) times daily.    Dispense:  90 tablet    Refill:  12    Left leg pain (established problem, improved) - continue gabapentin up to 600mg  three times per day  Bladder incontinence - consider referral to urology (tried and failed oxybutynin)   Follow Up Instructions:  - Return in about 4 months (around 08/13/2019).     I discussed the assessment and treatment plan with the patient. The patient was provided an opportunity to ask questions and all were answered.  The patient agreed with the plan and demonstrated an understanding of the instructions.   The patient was advised to call back or seek an in-person evaluation if the symptoms worsen or if the condition fails to improve as anticipated.  I provided 15 minutes of non-face-to-face time during this encounter.   Suanne Marker, MD 04/12/2019, 2:09 PM Certified in Neurology, Neurophysiology and Neuroimaging  [facility] [address] [place], [place] [address] [phone]

## 2019-04-18 ENCOUNTER — Other Ambulatory Visit (INDEPENDENT_AMBULATORY_CARE_PROVIDER_SITE_OTHER): Payer: Self-pay

## 2019-04-18 ENCOUNTER — Other Ambulatory Visit: Payer: Self-pay

## 2019-04-18 DIAGNOSIS — G35 Multiple sclerosis: Secondary | ICD-10-CM

## 2019-04-18 DIAGNOSIS — Z0289 Encounter for other administrative examinations: Secondary | ICD-10-CM

## 2019-04-19 ENCOUNTER — Encounter: Payer: Self-pay | Admitting: *Deleted

## 2019-04-19 LAB — CBC WITH DIFFERENTIAL/PLATELET
Basophils Absolute: 0 10*3/uL (ref 0.0–0.2)
Basos: 1 %
EOS (ABSOLUTE): 0.1 10*3/uL (ref 0.0–0.4)
Eos: 3 %
Hematocrit: 40.8 % (ref 34.0–46.6)
Hemoglobin: 13.5 g/dL (ref 11.1–15.9)
Immature Grans (Abs): 0 10*3/uL (ref 0.0–0.1)
Immature Granulocytes: 0 %
Lymphocytes Absolute: 1.3 10*3/uL (ref 0.7–3.1)
Lymphs: 28 %
MCH: 31.7 pg (ref 26.6–33.0)
MCHC: 33.1 g/dL (ref 31.5–35.7)
MCV: 96 fL (ref 79–97)
Monocytes Absolute: 0.4 10*3/uL (ref 0.1–0.9)
Monocytes: 8 %
Neutrophils Absolute: 2.7 10*3/uL (ref 1.4–7.0)
Neutrophils: 60 %
Platelets: 271 10*3/uL (ref 150–450)
RBC: 4.26 x10E6/uL (ref 3.77–5.28)
RDW: 12.9 % (ref 11.7–15.4)
WBC: 4.5 10*3/uL (ref 3.4–10.8)

## 2019-04-19 LAB — COMPREHENSIVE METABOLIC PANEL
ALT: 12 IU/L (ref 0–32)
AST: 13 IU/L (ref 0–40)
Albumin/Globulin Ratio: 1.5 (ref 1.2–2.2)
Albumin: 3.9 g/dL (ref 3.8–4.8)
Alkaline Phosphatase: 62 IU/L (ref 39–117)
BUN/Creatinine Ratio: 13 (ref 9–23)
BUN: 13 mg/dL (ref 6–24)
Bilirubin Total: 0.4 mg/dL (ref 0.0–1.2)
CO2: 25 mmol/L (ref 20–29)
Calcium: 8.9 mg/dL (ref 8.7–10.2)
Chloride: 102 mmol/L (ref 96–106)
Creatinine, Ser: 0.97 mg/dL (ref 0.57–1.00)
GFR calc Af Amer: 84 mL/min/{1.73_m2} (ref >59)
GFR calc non Af Amer: 73 mL/min/{1.73_m2} (ref >59)
Globulin, Total: 2.6 g/dL (ref 1.5–4.5)
Glucose: 76 mg/dL (ref 65–99)
Potassium: 4.6 mmol/L (ref 3.5–5.2)
Sodium: 140 mmol/L (ref 134–144)
Total Protein: 6.5 g/dL (ref 6.0–8.5)

## 2019-04-25 ENCOUNTER — Ambulatory Visit: Payer: Medicare Other | Admitting: Diagnostic Neuroimaging

## 2019-05-15 ENCOUNTER — Telehealth: Payer: Self-pay | Admitting: *Deleted

## 2019-05-15 DIAGNOSIS — G35 Multiple sclerosis: Secondary | ICD-10-CM

## 2019-05-15 NOTE — Telephone Encounter (Signed)
Patient will be scheduled to receive Ocrevus infusions at [place] [facility]. Ocrevus medication requires PA. [facility], spoke with Ronalee Belts for [employer]. He stated PA needs to be submitted to her Medicare Part D, id# [id number], grp: [id number]. [facility]  [facility] [phone], spoke with [facility] who took clinical information and advised PA must be done on [facility], [place]: [id number], Dx G35, on Ocrevus since May 2018. MRI brain stable since 2018, MRI cervical spine stable since 2016. Failed Tecfidera, JCV positive so cannot take Tysabri.  Your information has been submitted to [place] Medicare Part D. [employer] D will review the request and will issue a decision, typically within 1-3 days from your submission. You can check the updated outcome later by reopening this request.  If [employer] has not responded in 1-3 days or if you have any questions about your ePA request, please contact [place] Medicare Part D at [phone]

## 2019-05-15 NOTE — Telephone Encounter (Signed)
Ocrevus approved: Type of coverage approved: Non-Formulary This approval authorizes your coverage from 02/14/2019 - 05/14/2020.

## 2019-05-16 MED ORDER — OCRELIZUMAB 300 MG/10ML IV SOLN: INTRAVENOUS | 1 refills | Status: AC

## 2019-05-16 NOTE — Telephone Encounter (Addendum)
Ocrevus orders signed and successfully faxed to [facility]. Called patient to inform, LVM requesting she call back.

## 2019-05-16 NOTE — Telephone Encounter (Signed)
Patient returned call, and I advised her that her insurance no longer covers her Ocrevus infusions in this office. We have sent her orders to [facility]. She asked why they don't cover; I advised that each year some insurances change what they approve and cover. I advised she needs to call [facility] to schedule her next infusion which is due on or about 06/05/2019. I gave her their number , and she verbalized understanding, appreciation.

## 2019-05-16 NOTE — Telephone Encounter (Signed)
[facility] orders for Ocrevus on Dr [employer] desk for review, completion and signature.

## 2019-05-16 NOTE — Addendum Note (Signed)
Addended by: Minna Antis on: 05/16/2019 03:22 PM   Modules accepted: Orders

## 2019-05-20 ENCOUNTER — Other Ambulatory Visit: Payer: Self-pay | Admitting: Diagnostic Neuroimaging

## 2019-06-01 ENCOUNTER — Emergency Department (HOSPITAL_COMMUNITY)
Admission: EM | Admit: 2019-06-01 | Discharge: 2019-06-01 | Disposition: A | Discharge status: Home / Self Care | Payer: Medicare Other | Attending: Emergency Medicine | Admitting: Emergency Medicine

## 2019-06-01 ENCOUNTER — Emergency Department (HOSPITAL_COMMUNITY): Payer: Medicare Other

## 2019-06-01 ENCOUNTER — Other Ambulatory Visit: Payer: Self-pay

## 2019-06-01 ENCOUNTER — Encounter (HOSPITAL_COMMUNITY): Payer: Self-pay

## 2019-06-01 DIAGNOSIS — Z79899 Other long term (current) drug therapy: Secondary | ICD-10-CM | POA: Diagnosis not present

## 2019-06-01 DIAGNOSIS — Y929 Unspecified place or not applicable: Secondary | ICD-10-CM | POA: Diagnosis not present

## 2019-06-01 DIAGNOSIS — S8992XA Unspecified injury of left lower leg, initial encounter: Secondary | ICD-10-CM | POA: Insufficient documentation

## 2019-06-01 DIAGNOSIS — Y999 Unspecified external cause status: Secondary | ICD-10-CM | POA: Diagnosis not present

## 2019-06-01 DIAGNOSIS — W19XXXA Unspecified fall, initial encounter: Secondary | ICD-10-CM | POA: Insufficient documentation

## 2019-06-01 DIAGNOSIS — Z87891 Personal history of nicotine dependence: Secondary | ICD-10-CM | POA: Insufficient documentation

## 2019-06-01 DIAGNOSIS — G35 Multiple sclerosis: Secondary | ICD-10-CM | POA: Diagnosis not present

## 2019-06-01 DIAGNOSIS — Y939 Activity, unspecified: Secondary | ICD-10-CM | POA: Diagnosis not present

## 2019-06-01 MED ORDER — HYDROCODONE-ACETAMINOPHEN 5-325 MG PO TABS: 1.0000 | ORAL_TABLET | Freq: Four times a day (QID) | ORAL | 0 refills | Status: DC | PRN

## 2019-06-01 NOTE — ED Notes (Signed)
[facility] called for transportation home 

## 2019-06-01 NOTE — ED Notes (Signed)
Bed: WA30 Expected date:  Expected time:  Means of arrival:  Comments: Room 10 

## 2019-06-01 NOTE — ED Notes (Signed)
Patient transported to X-ray 

## 2019-06-01 NOTE — ED Triage Notes (Signed)
Pt BIBA from EMS. Pt has hx of MS. Pt fell today, as her 6 month treatment for MS is scheduled for tomorrow. Pt's legs have been weak . Pt hit her left knee which is her "good" knee. Some swelling noted, wrapped with ACE bandage. Pain is mostly on outside of leg VSS with EMS.

## 2019-06-01 NOTE — ED Notes (Signed)
Sandwich and drink given to patient.

## 2019-06-01 NOTE — ED Notes (Signed)
Brought over by RN in ED per charge nurse direction to wait on [facility] which she informed writer they were 5 hours out. They arrived to pick her up 25 min after she arrived here. She had all her property with her in her room and it went with her. She was here for a fall, not a psych patient.

## 2019-06-01 NOTE — ED Provider Notes (Signed)
[facility]-EMERGENCY DEPT Provider Note   CSN: [id number] Arrival date & time: 06/01/19  1324     History   Chief Complaint Chief Complaint  Patient presents with  . Fall    HPI Ebony Warren is a 43 y.o. female.     Patient status post fall earlier today.  Patient has a history of MS.  Patient has an injection for MS scheduled for tomorrow.  Patient's MS is somewhat baseline but she does have increased weakness in her legs.  When she fell she fell on her left knee.  Patient noticed some swelling.  Patient denies hitting her head or any other injuries there is no hip pain there is no ankle pain.  Right lower extremity without any concerns for injury.  No injury to upper extremities.  As stated patient did not hit her head and there is no loss of consciousness.  Patient is not on blood thinners.     Past Medical History:  Diagnosis Date  . Anxiety and depression   . Bradycardia   . Multiple sclerosis (HCC)   . Seizures (HCC)    told that they are not from the brain but stress related    Patient Active Problem List   Diagnosis Date Noted  . Right optic neuritis 05/13/2016  . MS (multiple sclerosis) (HCC) 01/27/2016  . Slurred speech 01/27/2016  . Other fatigue 01/27/2016  . Depression 01/27/2016  . Absence of bladder continence 01/27/2016  . Multiple sclerosis (HCC) 10/31/2014  . Hemiplegia, unspecified, affecting dominant side 08/30/2014    Past Surgical History:  Procedure Laterality Date  . CHOLECYSTECTOMY    . KNEE SURGERY       OB History   No obstetric history on file.      Home Medications    Prior to Admission medications   Medication Sig Start Date End Date Taking? Authorizing Provider  acetaminophen (TYLENOL) 325 MG tablet Take 650 mg by mouth daily as needed for mild pain.   Yes [provider]  baclofen (LIORESAL) 10 MG tablet Take 2 tablets (20 mg total) by mouth 3 (three) times daily. 04/12/19  Yes  Penumalli, Glenford BayleyVikram R, MD  cholecalciferol (VITAMIN D) 1000 UNITS tablet Take 1,000 Units by mouth daily. Take one a day for 6-12 months and we will recheck your levels when you come back in the office in 6 months   Yes [provider]  diphenhydrAMINE (BENADRYL) 25 mg capsule Take 25 mg by mouth every 6 (six) hours as needed. allergies   Yes [provider]  escitalopram (LEXAPRO) 20 MG tablet Take 20 mg by mouth. 04/07/16  Yes [provider]  gabapentin (NEURONTIN) 300 MG capsule Take 2 capsules (600 mg total) by mouth 3 (three) times daily. 04/12/19  Yes Penumalli, Glenford BayleyVikram R, MD  LORazepam (ATIVAN) 1 MG tablet Take 1 mg by mouth daily.   Yes [provider]  Multiple Vitamins-Minerals (MULTIVITAMIN ADULT PO) Take 1 tablet by mouth daily.   Yes [provider]  ocrelizumab (OCREVUS) 300 MG/10ML injection Infuse 600 mg IV Every 6 months 05/16/19  Yes Penumalli, Vikram R, MD  spironolactone (ALDACTONE) 25 MG tablet Take 25 mg by mouth daily.   Yes [provider]  tiZANidine (ZANAFLEX) 4 MG tablet Take 1 tablet (4 mg total) by mouth 3 (three) times daily. 04/12/19  Yes Penumalli, Glenford BayleyVikram R, MD  HYDROcodone-acetaminophen (NORCO/VICODIN) 5-325 MG tablet Take 1 tablet by mouth every 6 (six) hours as needed for moderate  pain. 06/01/19   Fredia Sorrow, MD    Family History Family History  Problem Relation Age of Onset  . Hypertension Mother   . Atrial fibrillation Mother   . Hypertension Father   . Atrial fibrillation Maternal Grandmother     Social History Social History   Tobacco Use  . Smoking status: Former Smoker    Packs/day: 0.35    Years: 3.00    Pack years: 1.05    Types: Cigarettes    Quit date: 12/07/2006    Years since quitting: 12.4  . Smokeless tobacco: Never Used  Substance Use Topics  . Alcohol use: No    Alcohol/week: 0.0 standard drinks    Comment: quit: 2013 (socially)  . Drug use: Yes    Types: Marijuana    Comment:  quit: 2001      Allergies   Morphine, Orange fruit [citrus], Oxycodone, Percocet [oxycodone-acetaminophen], Vicodin [hydrocodone-acetaminophen], and Other   Review of Systems Review of Systems  Constitutional: Negative for chills and fever.  HENT: Negative for rhinorrhea and sore throat.   Eyes: Negative for visual disturbance.  Respiratory: Negative for cough and shortness of breath.   Cardiovascular: Negative for chest pain and leg swelling.  Gastrointestinal: Negative for abdominal pain, diarrhea, nausea and vomiting.  Genitourinary: Negative for dysuria.  Musculoskeletal: Positive for joint swelling. Negative for back pain and neck pain.  Skin: Negative for rash.  Neurological: Positive for weakness and numbness. Negative for dizziness, light-headedness and headaches.  Hematological: Does not bruise/bleed easily.  Psychiatric/Behavioral: Negative for confusion.     Physical Exam Updated Vital Signs BP 123/78   Pulse 62   Temp 98.2 F (36.8 C) (Oral)   Resp 15   SpO2 100%   Physical Exam Vitals signs and nursing note reviewed.  Constitutional:      General: She is not in acute distress.    Appearance: Normal appearance. She is well-developed.  HENT:     Head: Normocephalic and atraumatic.  Eyes:     Extraocular Movements: Extraocular movements intact.     Conjunctiva/sclera: Conjunctivae normal.     Pupils: Pupils are equal, round, and reactive to light.  Neck:     Musculoskeletal: Normal range of motion and neck supple.  Cardiovascular:     Rate and Rhythm: Normal rate and regular rhythm.     Heart sounds: No murmur.  Pulmonary:     Effort: Pulmonary effort is normal. No respiratory distress.     Breath sounds: Normal breath sounds.  Abdominal:     Palpations: Abdomen is soft.     Tenderness: There is no abdominal tenderness.  Musculoskeletal:        General: Swelling and tenderness present.     Comments: Patient with a very superficial abrasion to the  left anterior knee.  There is some swelling but no frank joint effusion.  Kneecap is in place.  Limited range of motion due to discomfort.  Distally with good cap refill.  Sensation of the left lower extremity is better than the right.  Skin:    General: Skin is warm and dry.     Capillary Refill: Capillary refill takes less than 2 seconds.  Neurological:     Mental Status: She is alert. Mental status is at baseline.     Comments: Patient with a history of MS.  Patient at baseline neurologically from that.      ED Treatments / Results  Labs (all labs ordered are listed, but only abnormal results are  displayed) Labs Reviewed - No data to display  EKG    Radiology Dg Knee Complete 4 Views Left  Result Date: 06/01/2019 CLINICAL DATA:  Fall EXAM: LEFT KNEE - COMPLETE 4+ VIEW COMPARISON:  None. FINDINGS: Negative for fracture. Small joint effusion. This could be related to degenerative change or acute soft tissue injury. No significant joint space narrowing or spurring. IMPRESSION: Small joint effusion.  Negative for fracture Electronically Signed   By: Marlan Palau M.D.   On: 06/01/2019 17:18    Procedures Procedures (including critical care time)  Medications Ordered in ED Medications - No data to display   Initial Impression / Assessment and Plan / ED Course  I have reviewed the triage vital signs and the nursing notes.  Pertinent labs & imaging results that were available during my care of the patient were reviewed by me and considered in my medical decision making (see chart for details).        Patient with a fall.  Patient's x-rays of the left knee without any bony abnormalities.  But it has not ruled out any internal injury.  Patient will be treated with knee immobilizer follow-up with orthopedics.  And pain medicine.  Patient has a wheelchair at home.  Final Clinical Impressions(s) / ED Diagnoses   Final diagnoses:  Fall, initial encounter  Knee injury, left,  initial encounter    ED Discharge Orders         Ordered    HYDROcodone-acetaminophen (NORCO/VICODIN) 5-325 MG tablet  Every 6 hours PRN     06/01/19 1751           Vanetta Mulders, MD 06/01/19 1803

## 2019-06-01 NOTE — Discharge Instructions (Signed)
X-ray of the left knee without any bony abnormalities.  But she could have some internal injury to the knee.  Wear the knee immobilizer at all times.  Can remove it for bathing.  Call orthopedics for follow-up.  Take the pain medicine as directed.  Keep your appointment for your MS injection is scheduled for tomorrow.

## 2019-06-01 NOTE — ED Notes (Signed)
[facility] hours out

## 2019-06-02 ENCOUNTER — Encounter (HOSPITAL_COMMUNITY): Payer: Medicare Other

## 2019-06-02 ENCOUNTER — Telehealth: Payer: Self-pay | Admitting: Diagnostic Neuroimaging

## 2019-06-02 NOTE — Telephone Encounter (Signed)
Talked to mother, Jackelyn Poling. Patient is still in pain, they went ahead and postponed today's ocrevus to Tuesday, she will be going to [facility]. I advised patient's mother to seek consultation with ortho as advised. In the meantime she can try to elevate leg and try a cooling pack, either a ready made gel pack or some ice in a ziplock bag, wrapped in cloth or towel, she is advised, never to use direct ice on tissue. Patient has hydrocodone qid prn and they will use with caution. I told mother I would keep Dr. Leta Baptist and his nurse in the know. Mother demonstrated understanding and agreement.

## 2019-06-02 NOTE — Telephone Encounter (Signed)
Pt's mother Jackelyn Poling on [place] called stating that the pt fell yesterday and was taken to the hospital and was put on Hydrocodone. Pt's mother would like to be advised is pt is still able to receiver her infusion treatment today or should she cancel till further notice. Hospital advised them to reach out to Neurologist for advise.

## 2019-06-06 ENCOUNTER — Ambulatory Visit (HOSPITAL_COMMUNITY)
Admission: RE | Admit: 2019-06-06 | Discharge: 2019-06-06 | Disposition: A | Discharge status: Home / Self Care | Payer: Medicare Other | Source: Ambulatory Visit | Attending: Diagnostic Neuroimaging | Admitting: Diagnostic Neuroimaging

## 2019-06-06 ENCOUNTER — Telehealth: Payer: Self-pay | Admitting: *Deleted

## 2019-06-06 ENCOUNTER — Other Ambulatory Visit: Payer: Self-pay

## 2019-06-06 DIAGNOSIS — G35 Multiple sclerosis: Secondary | ICD-10-CM | POA: Diagnosis present

## 2019-06-06 MED ORDER — METHYLPREDNISOLONE SODIUM SUCC 125 MG IJ SOLR
100.0000 mg | Freq: Once | INTRAMUSCULAR | Status: AC
  Administered 2019-06-06: 100 mg via INTRAVENOUS
  Filled 2019-06-06: qty 2

## 2019-06-06 MED ORDER — FAMOTIDINE IN NACL 20-0.9 MG/50ML-% IV SOLN
20.0000 mg | Freq: Once | INTRAVENOUS | Status: AC
  Administered 2019-06-06: 20 mg via INTRAVENOUS
  Filled 2019-06-06: qty 50

## 2019-06-06 MED ORDER — DIPHENHYDRAMINE HCL 50 MG/ML IJ SOLN
50.0000 mg | Freq: Once | INTRAMUSCULAR | Status: AC
  Administered 2019-06-06: 50 mg via INTRAVENOUS
  Filled 2019-06-06: qty 1

## 2019-06-06 MED ORDER — SODIUM CHLORIDE 0.9 % IV SOLN
600.0000 mg | Freq: Once | INTRAVENOUS | Status: AC
  Administered 2019-06-06: 600 mg via INTRAVENOUS
  Filled 2019-06-06: qty 20

## 2019-06-06 MED ORDER — ACETAMINOPHEN 325 MG PO TABS
650.0000 mg | ORAL_TABLET | Freq: Once | ORAL | Status: AC
  Administered 2019-06-06: 650 mg via ORAL
  Filled 2019-06-06: qty 2

## 2019-06-06 MED ORDER — SODIUM CHLORIDE 0.9 % IV SOLN
INTRAVENOUS | Status: DC | PRN
  Administered 2019-06-06: 250 mL via INTRAVENOUS

## 2019-06-06 NOTE — Progress Notes (Signed)
[facility] consulted to start PIV for infusion. Pt verbalized she is used to getting stuck multiple times before an IV is obtained. Utilized [name] to assess vasculature in left arm. Attempted once to place [name] IV in upper arm; unsuccessful. Superficial veins are extremely small, roll, and blow per patient. Took 3 sticks by [facility] RN to obtain 24G IV in right hand.  Educated patient regarding port access d/t lifetime infusions for MS and poor vasculature. Advised patient to ask her physician about this option for more permanent access. She verbalized understanding and stated she would follow-up.

## 2019-06-06 NOTE — Progress Notes (Signed)
BP rechecked. Provider notified. Patient remains asymptomatic. Patient advise to follow up with PCP within a week.

## 2019-06-06 NOTE — Discharge Instructions (Signed)
Ocrelizumab injection °What is this medicine? °OCRELIZUMAB (ok re LIZ ue mab) treats multiple sclerosis. It helps to decrease the number of multiple sclerosis relapses. It is not a cure. °This medicine may be used for other purposes; ask your health care provider or pharmacist if you have questions. °COMMON BRAND NAME(S): OCREVUS °What should I tell my health care provider before I take this medicine? °They need to know if you have any of these conditions: °· cancer °· hepatitis B infection °· other infection (especially a virus infection such as chickenpox, cold sores, or herpes) °· an unusual or allergic reaction to ocrelizumab, other medicines, foods, dyes or preservatives °· pregnant or trying to get pregnant °· breast-feeding °How should I use this medicine? °This medicine is for infusion into a vein. It is given by a health care professional in a hospital or clinic setting. °Talk to your pediatrician regarding the use of this medicine in children. Special care may be needed. °Overdosage: If you think you have taken too much of this medicine contact a poison control center or emergency room at once. °NOTE: This medicine is only for you. Do not share this medicine with others. °What if I miss a dose? °Keep appointments for follow-up doses as directed. It is important not to miss your dose. Call your doctor or health care professional if you are unable to keep an appointment. °What may interact with this medicine? °· alemtuzumab °· daclizumab °· dimethyl fumarate °· fingolimod °· glatiramer °· interferon beta °· live virus vaccines °· mitoxantrone °· natalizumab °· peginterferon beta °· rituximab °· steroid medicines like prednisone or cortisone °· teriflunomide °This list may not describe all possible interactions. Give your health care provider a list of all the medicines, herbs, non-prescription drugs, or dietary supplements you use. Also tell them if you smoke, drink alcohol, or use illegal drugs. Some items  may interact with your medicine. °What should I watch for while using this medicine? °Tell your doctor or healthcare professional if your symptoms do not start to get better or if they get worse. °This medicine can cause serious allergic reactions. To reduce your risk you may need to take medicine before treatment with this medicine. Take your medicine as directed. °Women should inform their doctor if they wish to become pregnant or think they might be pregnant. There is a potential for serious side effects to an unborn child. Talk to your health care professional or pharmacist for more information. Female patients should use effective birth control methods while receiving this medicine and for 6 months after the last dose. °Call your doctor or health care professional for advice if you get a fever, chills or sore throat, or other symptoms of a cold or flu. Do not treat yourself. This drug decreases your body's ability to fight infections. Try to avoid being around people who are sick. °If you have a hepatitis B infection or a history of a hepatitis B infection, talk to your doctor. The symptoms of hepatitis B may get worse if you take this medicine. °In some patients, this medicine may cause a serious brain infection that may cause death. If you have any problems seeing, thinking, speaking, walking, or standing, tell your doctor right away. If you cannot reach your doctor, urgently seek other source of medical care. °This medicine can decrease the response to a vaccine. If you need to get vaccinated, tell your healthcare professional if you have received this medicine. Extra booster doses may be needed. Talk to your   doctor to see if a different vaccination schedule is needed. °Talk to your doctor about your risk of cancer. You may be more at risk for certain types of cancers if you take this medicine. °What side effects may I notice from receiving this medicine? °Side effects that you should report to your doctor  or health care professional as soon as possible: °· allergic reactions like skin rash, itching or hives, swelling of the face, lips, or tongue °· breathing problems °· facial flushing °· fast, irregular heartbeat °· lump or soreness in the breast °· signs and symptoms of herpes such as cold sore, shingles, or genital sores °· signs and symptoms of infection like fever or chills, cough, sore throat, pain or trouble passing urine °· signs and symptoms of low blood pressure like dizziness; feeling faint or lightheaded, falls; unusually weak or tired °· signs and symptoms of progressive multifocal leukoencephalopathy (PML) like changes in vision; clumsiness; confusion; personality changes; weakness on one side of the body °· swelling of the ankles, feet, hands °Side effects that usually do not require medical attention (report these to your doctor or health care professional if they continue or are bothersome): °· back pain °· depressed mood °· diarrhea °· pain, redness, or irritation at site where injected °This list may not describe all possible side effects. Call your doctor for medical advice about side effects. You may report side effects to FDA at 1-800-FDA-1088. °Where should I keep my medicine? °This drug is given in a hospital or clinic and will not be stored at home. °NOTE: This sheet is a summary. It may not cover all possible information. If you have questions about this medicine, talk to your doctor, pharmacist, or health care provider. °© 2020 [employer] (2016-03-10 09:40:25) ° °

## 2019-06-06 NOTE — Telephone Encounter (Signed)
Received call from Amelia, Cornelius @ [facility] short stay. The patient is there for Ocrevus infusion. He stated that the IV pre meds: solu medrol,  benadryl, Tylenol have infused, pepcid is going in now, 1/2 done. She has developed redness in her right arm away from IV site.   She has no other symptoms or complaints. Dr Leta Baptist is not in the office, so I advised he stop the IV until I can discuss with Dr Stephannie Li. He stated he would check patient's status; he came back to phone and stated "the redness is gone". He stated she has no other symptoms or complaints, stated she feels fine. I advised if this happens again he may need to slow down the infusion to see if redness resolves. I advised he continue with the infusion as ordered and call for any more concerns or questions.  He  verbalized understanding, appreciation.

## 2019-06-06 NOTE — Progress Notes (Signed)
Upon completion of IV Ocrevus, vitals sign was taken. Patient hypotensive and asymptomatic. Provider notified. Will recheck BP in 15 minutes. Patient instructed to be careful when transferring and standing up.

## 2019-06-06 NOTE — Progress Notes (Addendum)
Patient received Ocrevus via PIV. Pre infusion medications given. Mid way through the famotidine infusion, patient developed some redness away from the IV site in the right arm. Infusion was stopped and RN contacted the provider's office. Amorita RN. Rechecked the status of the patient, the redness had cleared. Per Jacobo Forest RN, restarted the famotidine at 50 ml/hr. Patient did not compain of any other symptom and the infusion was resumed with no other issues.  Tolerated well, discharge instructions given, verbalized understanding. Patient alert, oriented and transported in a wheelchair at the time of discharge.  Patient declined to wait for one hour post transfusion.

## 2019-10-30 ENCOUNTER — Other Ambulatory Visit: Payer: Self-pay | Admitting: *Deleted

## 2019-10-30 DIAGNOSIS — R5383 Other fatigue: Secondary | ICD-10-CM

## 2019-10-30 DIAGNOSIS — M62838 Other muscle spasm: Secondary | ICD-10-CM

## 2019-10-30 DIAGNOSIS — G35 Multiple sclerosis: Secondary | ICD-10-CM

## 2019-10-30 NOTE — Telephone Encounter (Signed)
Called patient and asked what type wheelchair she is requesting. She stated one just to get around in her home. She has a transport chair to use outside. I advised her for insurance purposes she may have to come into office for visit specifically for wheelchair need evaluation.  She asked if a prescription can be written and mailed to her. She will contact medical supply companies. I advised her Dr Leta Baptist can do that for her. She  verbalized understanding, appreciation. DME Rx printed, signed and put in mail to patient.

## 2019-11-15 ENCOUNTER — Other Ambulatory Visit: Payer: Self-pay | Admitting: Diagnostic Neuroimaging

## 2019-11-16 ENCOUNTER — Telehealth: Payer: Self-pay

## 2019-11-16 NOTE — Telephone Encounter (Signed)
IF pt call back please schedule her with Dr.Penumalli and use a work in [place]. I verified with Dr.Penumalli that work in [place] was okay to use in December 2020.  VM was left for patient that appt is needed for wheelchair evaluation. The office notes from 04/2019 are too old. This is per [facility].

## 2019-11-16 NOTE — Telephone Encounter (Signed)
I called Raquel Sarna about needing current office notes to determine medically necessity for new wheelchair. She stated last office notes were 04/12/2019. They can take office notes up to 6 months if necessary. I stated office notes can be fax over from Apr 12, 2019. Raquel Sarna stated fax number is [phone]. They stated if the office notes are not sufficient we will be given a call and pt will need a new appt with our office. Office notes fax twice to [phone]. Fax was confirmed and receive x2.

## 2019-11-16 NOTE — Telephone Encounter (Signed)
I receive from call from [facility] about not having the order for the wheelchair. She stated pt will need a current office notes.The notes from Apr 12, 2019 are not current. They do not know show why pt needs a wheelchair and what's the medical necessity. They stated pt will have to be evaluated for a newer appt  because insurance will deny the claim. When pt is schedule they need the office notes,and order for the wheelchair fax to  [phone]. Her contact number is [phone].

## 2019-11-16 NOTE — Telephone Encounter (Signed)
I called Raquel Sarna that office notes were fax twice and confirmed. She verbalized understanding.

## 2019-11-16 NOTE — Telephone Encounter (Signed)
Deshira- With [facility] called stating that they have not received the wheelchair order and will need that to be sent in, in order for it to be processed. Please follow up.

## 2019-11-16 NOTE — Telephone Encounter (Signed)
Ebony Warren with [employer] called in regards to missed call will be in office until 7:00pm est   Please follow up.  Phone# [phone]

## 2019-11-16 NOTE — Telephone Encounter (Addendum)
Ebony Warren from [facility] [employer] called and stated the need documentation on why its medically nesacary for pt to receive a wheelchair  Phone# [phone]  Fax # [phone]

## 2019-11-16 NOTE — Telephone Encounter (Signed)
Ebony Warren   SW  12:35 PM Note   Ebony Warren from [employer] [facility] called and stated the need documentation on why its medically nesacary for pt to receive a wheelchair  Phone# [phone]  Fax # [phone]

## 2019-11-20 ENCOUNTER — Ambulatory Visit (INDEPENDENT_AMBULATORY_CARE_PROVIDER_SITE_OTHER): Payer: Medicare Other | Admitting: Diagnostic Neuroimaging

## 2019-11-20 ENCOUNTER — Encounter: Payer: Self-pay | Admitting: Diagnostic Neuroimaging

## 2019-11-20 ENCOUNTER — Other Ambulatory Visit: Payer: Self-pay

## 2019-11-20 VITALS — BP 106/78 | HR 63 | Temp 97.2°F | Ht 68.0 in | Wt 274.0 lb

## 2019-11-20 DIAGNOSIS — R5383 Other fatigue: Secondary | ICD-10-CM | POA: Diagnosis not present

## 2019-11-20 DIAGNOSIS — G35 Multiple sclerosis: Secondary | ICD-10-CM | POA: Diagnosis not present

## 2019-11-20 DIAGNOSIS — M62838 Other muscle spasm: Secondary | ICD-10-CM | POA: Diagnosis not present

## 2019-11-20 NOTE — Addendum Note (Signed)
Addended by: Inis Sizer D on: 11/20/2019 01:38 PM   Modules accepted: Orders

## 2019-11-20 NOTE — Progress Notes (Signed)
Office notes and wheelchair order faxed to [facility], [phone], attention Deshira.

## 2019-11-20 NOTE — Telephone Encounter (Signed)
Patient seen today for FU and face to face for wheelchair order. Office notes and wheelchair order faxed to [facility], [phone], attention Deshira.

## 2019-11-20 NOTE — Progress Notes (Addendum)
[facility]  PATIENT: Ebony AlbertsStephanie Stills-benthell DOB: 03/28/1976  REFERRING CLINICIAN: Williams  HISTORY FROM: patient  REASON FOR VISIT: follow up   HISTORICAL  CHIEF COMPLAINT:  Chief Complaint  Patient presents with  . Multiple Sclerosis    rm 7, FU- face to face evaluation for wheelchair, needs labs for Ocrevus on Dec 30th    HISTORY OF PRESENT ILLNESS:   UPDATE (11/20/19, VRP): Here for face-face evaluation of wheelchair need. Currently using a wheelchair due to MS and mobility impairment, now needs a replacement. Patient able to propel wheelchair. Overall, since last visit, doing fair. Continues on ocrevus; had mild reaction at IV site last time. No other alleviating or aggravating factors. Tolerating meds.    UPDATE (10/25/18, VRP): Since last visit, doing about the same. Symptoms are stable EXCEPT right hand weakness and coordination is worse. Some right shoulder twitching. Memory loss continues. No alleviating or aggravating factors. Tolerating meds.    UPDATE (04/18/18, VRP): Since last visit, doing well on ocrevus, except had another fall. Recently more urine incont, and right leg swelling. Tolerating meds. No alleviating or aggravating factors.   UPDATE (10/18/17, VRP): Since last visit, doing well. Tolerating ocrevus. No alleviating or aggravating factors. No new events. C/o cough and dry mouth issues.   UPDATE 05/19/17: Since last visit, now transitioned to ocrevus (May 21, June 4). Did well with infusion. Spasms stable. Left leg slightly better. Bladder slightly better. Depression stable. Using rollator walker. No other triggering factors. Overall doing well.   UPDATE 02/08/17: Since last visit, MRI brain shows 1 new lesion. Here to discuss ocrevus. Also with more left leg pain, muscle spasms, gait diff, fatigue. Right ear pain better. Bladder incont slightly better with oxybutinin initially, but not anymore.   UPDATE 12/22/16: Since last visit, was  doing well until Nov 2017 and then developed stabbing right ear pain (continues to present). Also with new and increasing spasm and cramps in right hand and left leg since Dec 2017. Continues with fatigue and bladder incontinence.  UPDATE 05/13/16: Since last visit, doing well. No new MS events or symptoms. In retrospect, may have lost vision in right eye (2015, and possibly March 2016), lasting 3-7 days. Didn't mention to me until now. Recently this was noted optometry and ophthalmology exams.   UPDATE 01/27/16: Since last visit, fatigue improved with adjusting medication timing. Bladder issues stable. Had UTI at last visit, tx'd with abx, now better.  UPDATE 10/22/15: Since last visit was doing well. Then last week had new onset weakness in legs, bladder incontinence, strong smelling urine, and hand tremors. Now sxs almost fully resolved since yesterday.  UPDATE 05/01/15: Symptoms stable. Tolerating tecfidera.  UPDATE 01/25/15: Since last visit, had more problems for a few weeks (lower ext weakness, bladder incont), but now improved. Now on tecfidera x 3 weeks. Some itching and stomach issues with tecfidera, but mild. Main issues now consist of lower extremity pain, spasms, urinary leakage, daytime fatigue.  UPDATE 10/31/14: Since last visit, sxs are stable. Test results and diagnosis reviewed. No new events. Using cane to walk.  UPDATE (10/02/14): 43 year old right-handed female here for evaluation of double vision. 2008 patient was 6 months pregnant with her son, when all of a sudden she didn't feel good. She noticed right hand clumsiness and incoordination. This lasted for approximately 9 months and then stopped. She did not seek medical attention for this problem. 2013 patient had onset of right arm weakness, right leg weakness, numbness on the right side,  intermittent shaking sensation all over. Patient was evaluated in [place] and diagnosed with possible stroke. 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 Patient did not recover right arm or leg strength. Her speech was also affected. October 2014 patient moved to [facility]. 08/30/2014 patient was at home hanging clothes over the bathtub when she fell down. She is not sure what caused her fall. Her right leg may have given out. Patient fell and struck her head against the soap dish which broke off. Patient did not lose consciousness. Patient's family advised her to go to the emergency room. Patient was evaluated with CT and MRI of the brain. No acute findings were found. MRI of the brain did show multiple white matter lesions suspicious for chronic multiple sclerosis versus chronic small vessel ischemic disease. Advised to follow-up in outpatient basis. Since this fall patient has noticed some additional blurred vision, and double vision especially when looking to the left side.   REVIEW OF SYSTEMS: Full 14 system review of systems performed and negative except for: as per HPI.    ALLERGIES: Allergies  Allergen Reactions  . Morphine Itching  . Orange Fruit [Citrus]     Acidic foods Makes her itchy and her face "feels like its on fire", have rash.  . Oxycodone Itching  . Percocet [Oxycodone-Acetaminophen]     Stomach cramps   . Vicodin [Hydrocodone-Acetaminophen]     Stomach cramps   . Other Rash    Narcotics. Spicy foods (cause seizures)    HOME MEDICATIONS: Outpatient Medications Prior to Visit  Medication Sig Dispense Refill  . acetaminophen (TYLENOL) 325 MG tablet Take 650 mg by mouth daily as needed for mild pain.    . ARIPiprazole (ABILIFY) 5 MG tablet Take 5 mg by mouth daily.    . baclofen (LIORESAL) 10 MG tablet TAKE 2 TABLETS(20 MG) BY MOUTH THREE TIMES DAILY 180 tablet 1  . cholecalciferol (VITAMIN D) 1000 UNITS tablet Take 1,000 Units by mouth daily. Take one a day for 6-12 months and we will recheck your levels when you come back in the office in 6 months    . diphenhydrAMINE (BENADRYL) 25 mg capsule  Take 25 mg by mouth every 6 (six) hours as needed. allergies    . escitalopram (LEXAPRO) 20 MG tablet Take 20 mg by mouth.    . gabapentin (NEURONTIN) 300 MG capsule Take 2 capsules (600 mg total) by mouth 3 (three) times daily. 180 capsule 12  . LORazepam (ATIVAN) 1 MG tablet Take 1 mg by mouth daily.    . Multiple Vitamins-Minerals (MULTIVITAMIN ADULT PO) Take 1 tablet by mouth daily.    Marland Kitchen ocrelizumab (OCREVUS) 300 MG/10ML injection Infuse 600 mg IV Every 6 months 20 mL 1  . spironolactone (ALDACTONE) 25 MG tablet Take 25 mg by mouth daily.    Marland Kitchen tiZANidine (ZANAFLEX) 4 MG tablet Take 1 tablet (4 mg total) by mouth 3 (three) times daily. 90 tablet 12  . HYDROcodone-acetaminophen (NORCO/VICODIN) 5-325 MG tablet Take 1 tablet by mouth every 6 (six) hours as needed for moderate pain. 14 tablet 0   No facility-administered medications prior to visit.    PAST MEDICAL HISTORY: Past Medical History:  Diagnosis Date  . Anxiety and depression   . Bradycardia   . Multiple sclerosis (HCC)   . Seizures (HCC)    told that they are not from the brain but stress related    PAST SURGICAL HISTORY: Past Surgical History:  Procedure Laterality Date  . CHOLECYSTECTOMY    .  KNEE SURGERY      FAMILY HISTORY: Family History  Problem Relation Age of Onset  . Hypertension Mother   . Atrial fibrillation Mother   . Hypertension Father   . Atrial fibrillation Maternal Grandmother     SOCIAL HISTORY:  Social History   Socioeconomic History  . Marital status: Single    Spouse name: Not on file  . Number of children: 1  . Years of education: College  . Highest education level: Not on file  Occupational History    Employer: OTHER    Comment: disabled  Tobacco Use  . Smoking status: Former Smoker    Packs/day: 0.35    Years: 3.00    Pack years: 1.05    Types: Cigarettes    Quit date: 12/07/2006    Years since quitting: 12.9  . Smokeless tobacco: Never Used  Substance and Sexual Activity    . Alcohol use: No    Alcohol/week: 0.0 standard drinks    Comment: quit: 2013 (socially)  . Drug use: Yes    Types: Marijuana    Comment: quit: 2001   . Sexual activity: Not on file  Other Topics Concern  . Not on file  Social History Narrative   Patient lives at home with family.   Caffeine Use: drinks a cup of hot tea with tecfidera in the am   Patient is right handed.   Patient has a college education   Social Determinants of Corporate investment banker Strain:   . Difficulty of Paying Living Expenses: Not on file  Food Insecurity:   . Worried About Programme researcher, broadcasting/film/video in the Last Year: Not on file  . Ran Out of Food in the Last Year: Not on file  Transportation Needs:   . Lack of Transportation (Medical): Not on file  . Lack of Transportation (Non-Medical): Not on file  Physical Activity:   . Days of Exercise per Week: Not on file  . Minutes of Exercise per Session: Not on file  Stress:   . Feeling of Stress : Not on file  Social Connections:   . Frequency of Communication with Friends and Family: Not on file  . Frequency of Social Gatherings with Friends and Family: Not on file  . Attends Religious Services: Not on file  . Active Member of Clubs or Organizations: Not on file  . Attends Banker Meetings: Not on file  . Marital Status: Not on file  Intimate Partner Violence:   . Fear of Current or Ex-Partner: Not on file  . Emotionally Abused: Not on file  . Physically Abused: Not on file  . Sexually Abused: Not on file     PHYSICAL EXAM  Vitals:   11/20/19 1239  BP: 106/78  Pulse: 63  Temp: (!) 97.2 F (36.2 C)  Weight: 274 lb (124.3 kg)  Height: 5\' 8"  (1.727 m)   No exam data present   Body mass index is 41.66 kg/m.  GENERAL EXAM: Patient is in no distress; well developed, nourished and groomed; neck is supple  CARDIOVASCULAR: Regular rate and rhythm, no murmurs, no carotid bruits  NEUROLOGIC: MENTAL STATUS: awake, alert,  language fluent, comprehension intact, naming intact, fund of knowledge appropriate; SLOW SCANNING SPEECH PATTERN CRANIAL NERVE: PUPILS PINPOINT AND REACTIVE; visual fields full to confrontation, extraocular muscles intact; NO PTOSIS; facial sensation symmetric, FACE --> DEC LEFT NL FOLD, hearing intact, palate elevates symmetrically, uvula midline, shoulder shrug symmetric, tongue midline. MOTOR: INCREASED TONE IN BUE  and BLE; BUE 4 PROX AND 3 DISTAL; RLE 2 PROX AND 3 DISTAL; LLE 3-4 SENSORY: DECR IN RIGHT HAND AND RIGHT FOOT TO ALL MODALITIES COORDINATION: BUE DYSMETRIA; SLOW FINGER AND FOOT TAP ON RIGHT REFLEXES: BUE 2; BLE KNEES 3, ANKLES 2 GAIT/STATION: IN WHEEL CHAIR; CANNOT STAND OR  WALK UNASSISTED    DIAGNOSTIC DATA (LABS, IMAGING, TESTING) - I reviewed patient records, labs, notes, testing and imaging myself where available.  Lab Results  Component Value Date   WBC 4.5 04/18/2019   HGB 13.5 04/18/2019   HCT 40.8 04/18/2019   MCV 96 04/18/2019   PLT 271 04/18/2019      Component Value Date/Time   NA 140 04/18/2019 0941   K 4.6 04/18/2019 0941   CL 102 04/18/2019 0941   CO2 25 04/18/2019 0941   GLUCOSE 76 04/18/2019 0941   GLUCOSE 91 08/30/2014 1031   BUN 13 04/18/2019 0941   CREATININE 0.97 04/18/2019 0941   CALCIUM 8.9 04/18/2019 0941   PROT 6.5 04/18/2019 0941   ALBUMIN 3.9 04/18/2019 0941   AST 13 04/18/2019 0941   ALT 12 04/18/2019 0941   ALKPHOS 62 04/18/2019 0941   BILITOT 0.4 04/18/2019 0941   GFRNONAA 73 04/18/2019 0941   GFRAA 84 04/18/2019 0941   No results found for: CHOL No results found for: HGBA1C No results found for: VITAMINB12 No results found for: TSH  Vit D, 25-Hydroxy  Date Value Ref Range Status  12/22/2016 34.1 30.0 - 100.0 ng/mL Final    Comment:    Vitamin D deficiency has been defined by the Institute of Medicine and an Endocrine Society practice guideline as a level of serum 25-OH vitamin D less than 20 ng/mL (1,2). The Endocrine  Society went on to further define vitamin D insufficiency as a level between 21 and 29 ng/mL (2). 1. IOM (Institute of Medicine). 2010. Dietary reference    intakes for calcium and D. [place]: The    [employer]. 2. Holick MF, Binkley [place], Bischoff-Ferrari HA, et al.    Evaluation, treatment, and prevention of vitamin D    deficiency: an Endocrine Society clinical practice    guideline. JCEM. 2011 Jul; 96(7):1911-30.   10/22/2015 35.7 30.0 - 100.0 ng/mL Final    Comment:    Vitamin D deficiency has been defined by the Institute of Medicine and an Endocrine Society practice guideline as a level of serum 25-OH vitamin D less than 20 ng/mL (1,2). The Endocrine Society went on to further define vitamin D insufficiency as a level between 21 and 29 ng/mL (2). 1. IOM (Institute of Medicine). 2010. Dietary reference    intakes for calcium and D. [place]: The    [employer]. 2. Holick MF, Binkley [place], Bischoff-Ferrari HA, et al.    Evaluation, treatment, and prevention of vitamin D    deficiency: an Endocrine Society clinical practice    guideline. JCEM. 2011 Jul; 96(7):1911-30.   05/01/2015 25.3 (L) 30.0 - 100.0 ng/mL Final    Comment:    Vitamin D deficiency has been defined by the Institute of Medicine and an Endocrine Society practice guideline as a level of serum 25-OH vitamin D less than 20 ng/mL (1,2). The Endocrine Society went on to further define vitamin D insufficiency as a level between 21 and 29 ng/mL (2). 1. IOM (Institute of Medicine). 2010. Dietary reference    intakes for calcium and D. [place]: The    [employer]. 2. Holick MF, Binkley  [place], Bischoff-Ferrari HA, et al.    Evaluation, treatment, and prevention of vitamin D    deficiency: an Endocrine Society clinical practice    guideline. JCEM. 2011 Jul; 96(7):1911-30.    Lymphocytes Absolute  Date Value Ref Range Status  04/18/2019 1.3 0.7 - 3.1 x10E3/uL Final    10/25/2018 1.4 0.7 - 3.1 x10E3/uL Final  04/18/2018 1.6 0.7 - 3.1 x10E3/uL Final    11/05/15 MRI cervical spine (with and without) [I reviewed images myself and agree with interpretation. -VRP]  1. Multiple (~7-8) chronic demyelinating plaques from cervico-medullary junction down to C7 level. 2. No acute plaques. 3. No change from MRI on 10/24/14.   11/05/15 MRI brain (with and without) [I reviewed images myself and agree with interpretation. -VRP]  1. Few periventricular and peri-callosal and juxtacortical and medullary chronic demyelinating plaques.  2. No abnormal lesions are seen on post contrast views.  3. No change from MRI on 08/30/14.   01/04/17 MRI brain (with and without) demonstrating: 1. There are a few periventricular, peri-callosal, juxtacortical chronic demyelinating plaques.  2. No acute plaques.  3. Compared to MRI on 11/05/15, there is 1 new left parietal juxtacortical lesion (series 8, image 37).  11/07/17 MRI of the brain with and without contrast shows the following: 1.     T2/FLAIR hyperintense foci in the periventricular, juxtacortical and deep white matter of the hemispheres and in the left thalamus, brain stem and spinal cord in a pattern and configuration consistent with chronic demyelinating plaque associated with multiple sclerosis. None of the foci appears to be acute and, compared to the MRI dated 01/04/2017, there is no interval change. 2.     There is a normal enhancement pattern and there are no acute findings.  Labs - ANA, ANCA, ACE, HIV, RPR - all negative  11/01/14 anti-JCV ab - 1.31 (H) positive  07/27/18 RLE u/s - No evidence of DVT and no evidence of venous insufficiency.    ASSESSMENT AND PLAN  43 y.o. year old female here with multiple focal neurologic attacks since 2008. Neurologic exam demonstrates long tract signs affecting the right arm and right leg more than left side. MRI brain and cervical spine consistent with chronic demyelinating  disease. Other labs negative.   On tecfidera since Feb 2016. Had flare up symptoms in Nov 2016 and Nov-Dec 2017. Also new plaque in Jan 2018. Now on ocrevus since May/June 2018.   Muscle spasms and leg pain (on baclofen, tizanidine and gabapentin).  Fatigue improved with medication regimen adjustment.   Bladder incontinence --> using adult pads and oxybutinin   Depression stable (seeing Dr. Sherryle Lis, [facility]).    Dx:  No diagnosis found.    PLAN:  FACE - FACE eval for wheelchair - Order for heavy duty standard manual wheelchair. Patient suffers from multiple sclerosis which impairs their ability to perform daily activities like bathing, dressing, feeding and toileting in the home. A cane, crutch or walker will not resolve her issues with mobility limitations and performing activities of daily living. A wheelchair will allow patient to safely perform daily activities. Length of need- Lifetime. Patient can safely self-propel the heavy duty standard wheelchair while engaging in frequent activities such as meals and toileting. Patient has mobility limitations which cannot be resolved with a cane, crutch, or walker. Patient can safely self propel the HD wc.  Multiple sclerosis disease modifying therapy (established problem, stable) - continue ocrevus; doing well on this medication; check CBC, CMP every 6 months - continue  multivitamin and vitamin D supplements  MEMORY LOSS - optimize nutrition and exercise - follow up with psychiatry (optimize depression / anxiety treatments; consider caffeine vs stimulant meds) - safety / supervision issues reviewed - caution with driving and finances  RIGHT LEG SWELLING - improved with compression stockings  Muscle spasms (established problem, stable) - continue baclofen 20mg  three times per day for muscle spasms - continue tizanidine 4mg  three times per day for muscle spasms  Left leg pain (established problem, stable) - continue  gabapentin up to 600mg  three times per day  Bladder incontinence (established problem, improved) - consider follow up with refer to urology (tried and failed oxybutynin)  Depression / anxiety (established problem, stable) - [facility] - continue lexapro  Orders Placed This Encounter  Procedures  . For home use only DME high strength lightweight manual wheelchair with seat cushion  . CBC with Differential/Platelet  . Comprehensive metabolic panel   Return in about 6 months (around 05/20/2020).    Suanne MarkerVIKRAM R. Melyssa Signor, MD 11/20/2019, 1:10 PM Certified in Neurology, Neurophysiology and Neuroimaging  [facility] [address], [address] [place], [place] [address] [phone]

## 2019-11-21 LAB — CBC WITH DIFFERENTIAL/PLATELET
Basophils Absolute: 0 10*3/uL (ref 0.0–0.2)
Basos: 1 %
EOS (ABSOLUTE): 0.1 10*3/uL (ref 0.0–0.4)
Eos: 1 %
Hematocrit: 40.3 % (ref 34.0–46.6)
Hemoglobin: 13.3 g/dL (ref 11.1–15.9)
Immature Grans (Abs): 0 10*3/uL (ref 0.0–0.1)
Immature Granulocytes: 0 %
Lymphocytes Absolute: 1.4 10*3/uL (ref 0.7–3.1)
Lymphs: 22 %
MCH: 31.4 pg (ref 26.6–33.0)
MCHC: 33 g/dL (ref 31.5–35.7)
MCV: 95 fL (ref 79–97)
Monocytes Absolute: 0.5 10*3/uL (ref 0.1–0.9)
Monocytes: 7 %
Neutrophils Absolute: 4.5 10*3/uL (ref 1.4–7.0)
Neutrophils: 69 %
Platelets: 298 10*3/uL (ref 150–450)
RBC: 4.23 x10E6/uL (ref 3.77–5.28)
RDW: 12.1 % (ref 11.7–15.4)
WBC: 6.5 10*3/uL (ref 3.4–10.8)

## 2019-11-21 LAB — COMPREHENSIVE METABOLIC PANEL
ALT: 9 IU/L (ref 0–32)
AST: 13 IU/L (ref 0–40)
Albumin/Globulin Ratio: 1.3 (ref 1.2–2.2)
Albumin: 3.7 g/dL — ABNORMAL LOW (ref 3.8–4.8)
Alkaline Phosphatase: 96 IU/L (ref 39–117)
BUN/Creatinine Ratio: 15 (ref 9–23)
BUN: 14 mg/dL (ref 6–24)
Bilirubin Total: 0.3 mg/dL (ref 0.0–1.2)
CO2: 23 mmol/L (ref 20–29)
Calcium: 9.4 mg/dL (ref 8.7–10.2)
Chloride: 105 mmol/L (ref 96–106)
Creatinine, Ser: 0.91 mg/dL (ref 0.57–1.00)
GFR calc Af Amer: 90 mL/min/{1.73_m2} (ref >59)
GFR calc non Af Amer: 78 mL/min/{1.73_m2} (ref >59)
Globulin, Total: 2.8 g/dL (ref 1.5–4.5)
Glucose: 82 mg/dL (ref 65–99)
Potassium: 4.5 mmol/L (ref 3.5–5.2)
Sodium: 140 mmol/L (ref 134–144)
Total Protein: 6.5 g/dL (ref 6.0–8.5)

## 2019-11-22 ENCOUNTER — Encounter: Payer: Self-pay | Admitting: *Deleted

## 2019-12-06 ENCOUNTER — Telehealth: Payer: Self-pay | Admitting: *Deleted

## 2019-12-06 DIAGNOSIS — G35 Multiple sclerosis: Secondary | ICD-10-CM

## 2019-12-06 NOTE — Telephone Encounter (Signed)
Received my chart from patient asking about wheelchair order, stated the company helping her never got order.  I sent community message to [facility] re: office note, order were faxed to them on 11/20/19. Received reply :  Due to patient's diagnosis of MS our rehab department is happy to handle this referral if MD would like her to be assessed by a PT to see what she qualifies for - either a power wheelchair or a custom manual wheelchair.  If you would like for [name] to proceed please let me know and I will fax over an order for a PT eval for wheelchair for MD to sign.  We will also contact patient and do a prequalification by phone before proceeding   I called [facility], spoke with Levada Dy who stated they never received 11/20/19 office notes and order I faxed that day. She stated that patient doesn't qualify for high strength lightweight manual WC that was ordered because she weighs over 250 lbs. She stated they need to know which type wheelchair patient wants: power, customized manual or regular manual.  I advised I'll call patient to clarify and update her. Called patient and LVM requesting call back.

## 2019-12-06 NOTE — Telephone Encounter (Signed)
Patient called back and I explained delay and informed her of different wheelchair options. Advised she can't get a high strength chair (the order that was first written)  due to her weight. She stated all she wants is a "plain wheelchair to get around in her house"; she doesn't need one customized.  I advised I;ll let Levada Dy, [place] know. She  verbalized understanding, appreciation. I called Levada Dy and let her know. She will send community message with all details they need to get patient wheelchair.

## 2019-12-11 ENCOUNTER — Other Ambulatory Visit: Payer: Self-pay

## 2019-12-11 ENCOUNTER — Ambulatory Visit (HOSPITAL_COMMUNITY)
Admission: RE | Admit: 2019-12-11 | Discharge: 2019-12-11 | Disposition: A | Discharge status: Home / Self Care | Payer: Medicare Other | Source: Ambulatory Visit | Attending: Internal Medicine | Admitting: Internal Medicine

## 2019-12-11 DIAGNOSIS — G35 Multiple sclerosis: Secondary | ICD-10-CM | POA: Insufficient documentation

## 2019-12-11 MED ORDER — DIPHENHYDRAMINE HCL 50 MG/ML IJ SOLN
50.0000 mg | Freq: Once | INTRAMUSCULAR | Status: AC
  Administered 2019-12-11: 50 mg via INTRAVENOUS
  Filled 2019-12-11: qty 1

## 2019-12-11 MED ORDER — ACETAMINOPHEN 325 MG PO TABS
650.0000 mg | ORAL_TABLET | Freq: Once | ORAL | Status: AC
  Administered 2019-12-11: 09:00:00 650 mg via ORAL
  Filled 2019-12-11: qty 2

## 2019-12-11 MED ORDER — METHYLPREDNISOLONE SODIUM SUCC 125 MG IJ SOLR
125.0000 mg | Freq: Once | INTRAMUSCULAR | Status: AC
  Administered 2019-12-11: 125 mg via INTRAVENOUS
  Filled 2019-12-11: qty 2

## 2019-12-11 MED ORDER — SODIUM CHLORIDE 0.9 % IV SOLN
600.0000 mg | Freq: Once | INTRAVENOUS | Status: AC
  Administered 2019-12-11: 10:00:00 600 mg via INTRAVENOUS
  Filled 2019-12-11: qty 20

## 2019-12-11 MED ORDER — SODIUM CHLORIDE 0.9 % IV SOLN
INTRAVENOUS | Status: DC | PRN
  Administered 2019-12-11: 250 mL via INTRAVENOUS

## 2019-12-11 MED ORDER — FAMOTIDINE IN NACL 20-0.9 MG/50ML-% IV SOLN
20.0000 mg | Freq: Once | INTRAVENOUS | Status: AC
  Administered 2019-12-11: 09:00:00 20 mg via INTRAVENOUS
  Filled 2019-12-11: qty 50

## 2019-12-11 NOTE — Progress Notes (Signed)
PATIENT CARE CENTER NOTE  Diagnosis: Multiple Sclerosis G.35   Provider: Joycelyn Schmid, MD   Procedure: Ocrevus infusion   Note: Patient received 600 mg Ocrevus infusion via PIV. Pre-medications given per order. Infusion titrated per protocol. Vital signs stable. Discharge instructions given. Patient to come back in 6 months for next dose. Alert, oriented and ambulatory to wheelchair at discharge.

## 2019-12-11 NOTE — Discharge Instructions (Signed)
Ocrelizumab injection °What is this medicine? °OCRELIZUMAB (ok re LIZ ue mab) treats multiple sclerosis. It helps to decrease the number of multiple sclerosis relapses. It is not a cure. °This medicine may be used for other purposes; ask your health care provider or pharmacist if you have questions. °COMMON BRAND NAME(S): OCREVUS °What should I tell my health care provider before I take this medicine? °They need to know if you have any of these conditions: °· cancer °· hepatitis B infection °· other infection (especially a virus infection such as chickenpox, cold sores, or herpes) °· an unusual or allergic reaction to ocrelizumab, other medicines, foods, dyes or preservatives °· pregnant or trying to get pregnant °· breast-feeding °How should I use this medicine? °This medicine is for infusion into a vein. It is given by a health care professional in a hospital or clinic setting. °A special MedGuide will be given to you before each treatment. Be sure to read this information carefully each time. °Talk to your pediatrician regarding the use of this medicine in children. Special care may be needed. °Overdosage: If you think you have taken too much of this medicine contact a poison control center or emergency room at once. °NOTE: This medicine is only for you. Do not share this medicine with others. °What if I miss a dose? °Keep appointments for follow-up doses as directed. It is important not to miss your dose. Call your doctor or health care professional if you are unable to keep an appointment. °What may interact with this medicine? °· alemtuzumab °· daclizumab °· dimethyl fumarate °· fingolimod °· glatiramer °· interferon beta °· live virus vaccines °· mitoxantrone °· natalizumab °· peginterferon beta °· rituximab °· steroid medicines like prednisone or cortisone °· teriflunomide °This list may not describe all possible interactions. Give your health care provider a list of all the medicines, herbs,  non-prescription drugs, or dietary supplements you use. Also tell them if you smoke, drink alcohol, or use illegal drugs. Some items may interact with your medicine. °What should I watch for while using this medicine? °Tell your doctor or healthcare professional if your symptoms do not start to get better or if they get worse. °This medicine can cause serious allergic reactions. To reduce your risk you may need to take medicine before treatment with this medicine. Take your medicine as directed. °Women should inform their doctor if they wish to become pregnant or think they might be pregnant. There is a potential for serious side effects to an unborn child. Talk to your health care professional or pharmacist for more information. Female patients should use effective birth control methods while receiving this medicine and for 6 months after the last dose. °Call your doctor or health care professional for advice if you get a fever, chills or sore throat, or other symptoms of a cold or flu. Do not treat yourself. This drug decreases your body's ability to fight infections. Try to avoid being around people who are sick. °If you have a hepatitis B infection or a history of a hepatitis B infection, talk to your doctor. The symptoms of hepatitis B may get worse if you take this medicine. °In some patients, this medicine may cause a serious brain infection that may cause death. If you have any problems seeing, thinking, speaking, walking, or standing, tell your doctor right away. If you cannot reach your doctor, urgently seek other source of medical care. °This medicine can decrease the response to a vaccine. If you need to get   vaccinated, tell your healthcare professional if you have received this medicine. Extra booster doses may be needed. Talk to your doctor to see if a different vaccination schedule is needed. °Talk to your doctor about your risk of cancer. You may be more at risk for certain types of cancers if you  take this medicine. °What side effects may I notice from receiving this medicine? °Side effects that you should report to your doctor or health care professional as soon as possible: °· allergic reactions like skin rash, itching or hives, swelling of the face, lips, or tongue °· breathing problems °· facial flushing °· fast, irregular heartbeat °· lump or soreness in the breast °· signs and symptoms of herpes such as cold sore, shingles, or genital sores °· signs and symptoms of infection like fever or chills, cough, sore throat, pain or trouble passing urine °· signs and symptoms of low blood pressure like dizziness; feeling faint or lightheaded, falls; unusually weak or tired °· signs and symptoms of progressive multifocal leukoencephalopathy (PML) like changes in vision; clumsiness; confusion; personality changes; weakness on one side of the body °· swelling of the ankles, feet, hands °Side effects that usually do not require medical attention (report these to your doctor or health care professional if they continue or are bothersome): °· back pain °· depressed mood °· diarrhea °· pain, redness, or irritation at site where injected °This list may not describe all possible side effects. Call your doctor for medical advice about side effects. You may report side effects to FDA at 1-800-FDA-1088. °Where should I keep my medicine? °This drug is given in a hospital or clinic and will not be stored at home. °NOTE: This sheet is a summary. It may not cover all possible information. If you have questions about this medicine, talk to your doctor, pharmacist, or health care provider. °© 2020 Elsevier/Gold Standard (2018-11-28 07:41:53) ° °

## 2019-12-11 NOTE — Telephone Encounter (Signed)
New order for standard manual wheelchair printed

## 2019-12-11 NOTE — Addendum Note (Signed)
Addended by: [place] Pink on: 12/11/2019 01:26 PM   Modules accepted: Orders

## 2019-12-12 ENCOUNTER — Other Ambulatory Visit: Payer: Self-pay | Admitting: *Deleted

## 2019-12-12 ENCOUNTER — Encounter: Payer: Self-pay | Admitting: *Deleted

## 2019-12-12 DIAGNOSIS — G35 Multiple sclerosis: Secondary | ICD-10-CM

## 2019-12-12 NOTE — Progress Notes (Signed)
Done

## 2019-12-12 NOTE — Progress Notes (Unsigned)
Received message from [employer] re: wheelchair order incorrect, needs to be reordered, and office note must be addended.

## 2019-12-12 NOTE — Telephone Encounter (Signed)
Sent community message to [place], [employer] [facility] to inform her of new wheelchair order and office note with addendum.

## 2020-01-15 ENCOUNTER — Other Ambulatory Visit: Payer: Self-pay | Admitting: Diagnostic Neuroimaging

## 2020-03-13 ENCOUNTER — Other Ambulatory Visit: Payer: Self-pay | Admitting: Diagnostic Neuroimaging

## 2020-04-20 ENCOUNTER — Other Ambulatory Visit: Payer: Self-pay | Admitting: Diagnostic Neuroimaging

## 2020-05-15 ENCOUNTER — Other Ambulatory Visit: Payer: Self-pay | Admitting: Diagnostic Neuroimaging

## 2020-05-21 ENCOUNTER — Other Ambulatory Visit: Payer: Self-pay

## 2020-05-21 ENCOUNTER — Telehealth: Payer: Self-pay | Admitting: Diagnostic Neuroimaging

## 2020-05-21 ENCOUNTER — Ambulatory Visit (INDEPENDENT_AMBULATORY_CARE_PROVIDER_SITE_OTHER): Payer: Medicare Other | Admitting: Diagnostic Neuroimaging

## 2020-05-21 ENCOUNTER — Encounter: Payer: Self-pay | Admitting: Diagnostic Neuroimaging

## 2020-05-21 VITALS — BP 89/60 | HR 68 | Ht 68.0 in | Wt 291.6 lb

## 2020-05-21 DIAGNOSIS — M62838 Other muscle spasm: Secondary | ICD-10-CM

## 2020-05-21 DIAGNOSIS — R5383 Other fatigue: Secondary | ICD-10-CM

## 2020-05-21 DIAGNOSIS — G35 Multiple sclerosis: Secondary | ICD-10-CM | POA: Diagnosis not present

## 2020-05-21 NOTE — Telephone Encounter (Signed)
Medicare/medicaid order sent to [facility]. No auth they will reach out to the patient to schedule.  °

## 2020-05-21 NOTE — Progress Notes (Signed)
[facility]  PATIENT: Ebony Warren DOB: [birth year]  REFERRING CLINICIAN: Williams  HISTORY FROM: patient  REASON FOR VISIT: follow up   HISTORICAL  CHIEF COMPLAINT:  Chief Complaint  Patient presents with  . Multiple Sclerosis    rm 7, 6 month FU, next Ocrevus 06/12/20    HISTORY OF PRESENT ILLNESS:   UPDATE (05/21/20, VRP): Since last visit, doing fair. More depression, spasms. Had event last week of body trembling, no LOC, lasting 10 minutes. Symptoms are progressive. Tolerating ocrevus and muscle relaxers.  UPDATE (11/20/19, VRP): Here for face-face evaluation of wheelchair need. Currently using a wheelchair due to MS and mobility impairment, now needs a replacement. Patient able to propel wheelchair. Overall, since last visit, doing fair. Continues on ocrevus; had mild reaction at IV site last time. No other alleviating or aggravating factors. Tolerating meds.    UPDATE (10/25/18, VRP): Since last visit, doing about the same. Symptoms are stable EXCEPT right hand weakness and coordination is worse. Some right shoulder twitching. Memory loss continues. No alleviating or aggravating factors. Tolerating meds.    UPDATE (04/18/18, VRP): Since last visit, doing well on ocrevus, except had another fall. Recently more urine incont, and right leg swelling. Tolerating meds. No alleviating or aggravating factors.   UPDATE (10/18/17, VRP): Since last visit, doing well. Tolerating ocrevus. No alleviating or aggravating factors. No new events. C/o cough and dry mouth issues.   UPDATE 05/19/17: Since last visit, now transitioned to ocrevus (May 21, June 4). Did well with infusion. Spasms stable. Left leg slightly better. Bladder slightly better. Depression stable. Using rollator walker. No other triggering factors. Overall doing well.   UPDATE 02/08/17: Since last visit, MRI brain shows 1 new lesion. Here to discuss ocrevus. Also with more left leg pain, muscle  spasms, gait diff, fatigue. Right ear pain better. Bladder incont slightly better with oxybutinin initially, but not anymore.   UPDATE 12/22/16: Since last visit, was doing well until Nov 2017 and then developed stabbing right ear pain (continues to present). Also with new and increasing spasm and cramps in right hand and left leg since Dec 2017. Continues with fatigue and bladder incontinence.  UPDATE 05/13/16: Since last visit, doing well. No new MS events or symptoms. In retrospect, may have lost vision in right eye (2015, and possibly March 2016), lasting 3-7 days. Didn't mention to me until now. Recently this was noted optometry and ophthalmology exams.   UPDATE 01/27/16: Since last visit, fatigue improved with adjusting medication timing. Bladder issues stable. Had UTI at last visit, tx'd with abx, now better.  UPDATE 10/22/15: Since last visit was doing well. Then last week had new onset weakness in legs, bladder incontinence, strong smelling urine, and hand tremors. Now sxs almost fully resolved since yesterday.  UPDATE 05/01/15: Symptoms stable. Tolerating tecfidera.  UPDATE 01/25/15: Since last visit, had more problems for a few weeks (lower ext weakness, bladder incont), but now improved. Now on tecfidera x 3 weeks. Some itching and stomach issues with tecfidera, but mild. Main issues now consist of lower extremity pain, spasms, urinary leakage, daytime fatigue.  UPDATE 10/31/14: Since last visit, sxs are stable. Test results and diagnosis reviewed. No new events. Using cane to walk.  UPDATE (10/02/14): 44 year old right-handed female here for evaluation of double vision. 2008 patient was 6 months pregnant with her son, when all of a sudden she didn't feel good. She noticed right hand clumsiness and incoordination. This lasted for approximately 9 months and then stopped. She  did not seek medical attention for this problem. 2013 patient had onset of right arm weakness, right leg weakness,  numbness on the right side, intermittent shaking sensation all over. Patient was evaluated in [place] and diagnosed with possible stroke. 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 Patient did not recover right arm or leg strength. Her speech was also affected. October 2014 patient moved to [facility]. 08/30/2014 patient was at home hanging clothes over the bathtub when she fell down. She is not sure what caused her fall. Her right leg may have given out. Patient fell and struck her head against the soap dish which broke off. Patient did not lose consciousness. Patient's family advised her to go to the emergency room. Patient was evaluated with CT and MRI of the brain. No acute findings were found. MRI of the brain did show multiple white matter lesions suspicious for chronic multiple sclerosis versus chronic small vessel ischemic disease. Advised to follow-up in outpatient basis. Since this fall patient has noticed some additional blurred vision, and double vision especially when looking to the left side.   REVIEW OF SYSTEMS: Full 14 system review of systems performed and negative except for: as per HPI.    ALLERGIES: Allergies  Allergen Reactions  . Morphine Itching  . Orange Fruit [Citrus]     Acidic foods Makes her itchy and her face "feels like its on fire", have rash.  . Oxycodone Itching    rash  . Percocet [Oxycodone-Acetaminophen]     Stomach cramps   . Vicodin [Hydrocodone-Acetaminophen]     Stomach cramps   . Other Rash    Narcotics. Spicy foods (cause seizures)    HOME MEDICATIONS: Outpatient Medications Prior to Visit  Medication Sig Dispense Refill  . acetaminophen (TYLENOL) 325 MG tablet Take 650 mg by mouth daily as needed for mild pain.    . ARIPiprazole (ABILIFY) 2 MG tablet Take 2 mg by mouth daily.    . baclofen (LIORESAL) 10 MG tablet TAKE 2 TABLETS(20 MG) BY MOUTH THREE TIMES DAILY 180 tablet 11  . cholecalciferol (VITAMIN D) 1000 UNITS tablet Take 1,000  Units by mouth daily. Take one a day for 6-12 months and we will recheck your levels when you come back in the office in 6 months    . diphenhydrAMINE (BENADRYL) 25 mg capsule Take 25 mg by mouth every 6 (six) hours as needed. allergies    . escitalopram (LEXAPRO) 20 MG tablet Take 20 mg by mouth.    . furosemide (LASIX) 20 MG tablet Take 20 mg by mouth every morning.    . gabapentin (NEURONTIN) 300 MG capsule TAKE 2 CAPSULES(600 MG) BY MOUTH THREE TIMES DAILY 180 capsule 12  . LORazepam (ATIVAN) 1 MG tablet Take 1 mg by mouth daily.    . Multiple Vitamins-Minerals (MULTIVITAMIN ADULT PO) Take 1 tablet by mouth daily.    Marland Kitchen ocrelizumab (OCREVUS) 300 MG/10ML injection Infuse 600 mg IV Every 6 months 20 mL 1  . tiZANidine (ZANAFLEX) 4 MG tablet TAKE 1 TABLET(4 MG) BY MOUTH THREE TIMES DAILY 90 tablet 12  . ARIPiprazole (ABILIFY) 5 MG tablet Take 5 mg by mouth daily.    Marland Kitchen spironolactone (ALDACTONE) 25 MG tablet Take 25 mg by mouth daily.     No facility-administered medications prior to visit.    PAST MEDICAL HISTORY: Past Medical History:  Diagnosis Date  . Anxiety and depression   . Bradycardia   . Multiple sclerosis ([place])   . Seizures ([place])  told that they are not from the brain but stress related    PAST SURGICAL HISTORY: Past Surgical History:  Procedure Laterality Date  . CHOLECYSTECTOMY    . KNEE SURGERY      FAMILY HISTORY: Family History  Problem Relation Age of Onset  . Hypertension Mother   . Atrial fibrillation Mother   . Hypertension Father   . Atrial fibrillation Maternal Grandmother     SOCIAL HISTORY:  Social History   Socioeconomic History  . Marital status: Single    Spouse name: Not on file  . Number of children: 1  . Years of education: College  . Highest education level: Not on file  Occupational History    Employer: OTHER    Comment: disabled  Tobacco Use  . Smoking status: Former Smoker    Packs/day: 0.35    Years: 3.00    Pack years:  1.05    Types: Cigarettes    Quit date: 12/07/2006    Years since quitting: 13.4  . Smokeless tobacco: Never Used  Substance and Sexual Activity  . Alcohol use: No    Alcohol/week: 0.0 standard drinks    Comment: quit: 2013 (socially)  . Drug use: Yes    Types: Marijuana    Comment: quit: 2001   . Sexual activity: Not on file  Other Topics Concern  . Not on file  Social History Narrative   Patient lives at home with family.   Caffeine Use: drinks a cup of hot tea with tecfidera in the am   Patient is right handed.   Patient has a college education   Social Determinants of Corporate investment banker Strain:   . Difficulty of Paying Living Expenses:   Food Insecurity:   . Worried About Programme researcher, broadcasting/film/video in the Last Year:   . Barista in the Last Year:   Transportation Needs:   . Freight forwarder (Medical):   Marland Kitchen Lack of Transportation (Non-Medical):   Physical Activity:   . Days of Exercise per Week:   . Minutes of Exercise per Session:   Stress:   . Feeling of Stress :   Social Connections:   . Frequency of Communication with Friends and Family:   . Frequency of Social Gatherings with Friends and Family:   . Attends Religious Services:   . Active Member of Clubs or Organizations:   . Attends Banker Meetings:   Marland Kitchen Marital Status:   Intimate Partner Violence:   . Fear of Current or Ex-Partner:   . Emotionally Abused:   Marland Kitchen Physically Abused:   . Sexually Abused:      PHYSICAL EXAM  Vitals:   05/21/20 1108  BP: (!) 89/60  Pulse: 68  Weight: 291 lb 9.6 oz (132.3 kg)  Height: 5\' 8"  (1.727 m)   No exam data present   Body mass index is 44.34 kg/m.  GENERAL EXAM: Patient is in no distress; well developed, nourished and groomed; neck is supple  CARDIOVASCULAR: Regular rate and rhythm, no murmurs, no carotid bruits  NEUROLOGIC: MENTAL STATUS: awake, alert, language fluent, comprehension intact, naming intact, fund of knowledge  appropriate; SLOW SCANNING SPEECH PATTERN CRANIAL NERVE: PUPILS PINPOINT AND REACTIVE; visual fields full to confrontation, extraocular muscles intact; NO PTOSIS; facial sensation symmetric, FACE --> DEC LEFT NL FOLD, hearing intact, palate elevates symmetrically, uvula midline, shoulder shrug symmetric, tongue midline. MOTOR: INCREASED TONE IN BUE and BLE; BUE 4 PROX AND 3 DISTAL; RLE 2  PROX AND 3 DISTAL; LLE 3-4 SENSORY: DECR IN RIGHT HAND AND RIGHT FOOT TO ALL MODALITIES COORDINATION: BUE DYSMETRIA; SLOW FINGER AND FOOT TAP ON RIGHT REFLEXES: BUE 2; BLE KNEES 3, ANKLES 2 GAIT/STATION: IN CHAIR; USING ROLLATOR    DIAGNOSTIC DATA (LABS, IMAGING, TESTING) - I reviewed patient records, labs, notes, testing and imaging myself where available.  Lab Results  Component Value Date   WBC 6.5 11/20/2019   HGB 13.3 11/20/2019   HCT 40.3 11/20/2019   MCV 95 11/20/2019   PLT 298 11/20/2019      Component Value Date/Time   NA 140 11/20/2019 1339   K 4.5 11/20/2019 1339   CL 105 11/20/2019 1339   CO2 23 11/20/2019 1339   GLUCOSE 82 11/20/2019 1339   GLUCOSE 91 08/30/2014 1031   BUN 14 11/20/2019 1339   CREATININE 0.91 11/20/2019 1339   CALCIUM 9.4 11/20/2019 1339   PROT 6.5 11/20/2019 1339   ALBUMIN 3.7 (L) 11/20/2019 1339   AST 13 11/20/2019 1339   ALT 9 11/20/2019 1339   ALKPHOS 96 11/20/2019 1339   BILITOT 0.3 11/20/2019 1339   GFRNONAA 78 11/20/2019 1339   GFRAA 90 11/20/2019 1339   No results found for: CHOL No results found for: HGBA1C No results found for: VITAMINB12 No results found for: TSH  Vit D, 25-Hydroxy  Date Value Ref Range Status  12/22/2016 34.1 30.0 - 100.0 ng/mL Final    Comment:    Vitamin D deficiency has been defined by the Institute of Medicine and an Endocrine Society practice guideline as a level of serum 25-OH vitamin D less than 20 ng/mL (1,2). The Endocrine Society went on to further define vitamin D insufficiency as a level between 21 and 29  ng/mL (2). 1. IOM (Institute of Medicine). 2010. Dietary reference    intakes for calcium and D. [place]: The    [employer]. 2. Holick MF, Binkley [place], Bischoff-Ferrari HA, et al.    Evaluation, treatment, and prevention of vitamin D    deficiency: an Endocrine Society clinical practice    guideline. JCEM. 2011 Jul; 96(7):1911-30.   10/22/2015 35.7 30.0 - 100.0 ng/mL Final    Comment:    Vitamin D deficiency has been defined by the Institute of Medicine and an Endocrine Society practice guideline as a level of serum 25-OH vitamin D less than 20 ng/mL (1,2). The Endocrine Society went on to further define vitamin D insufficiency as a level between 21 and 29 ng/mL (2). 1. IOM (Institute of Medicine). 2010. Dietary reference    intakes for calcium and D. [place]: The    [employer]. 2. Holick MF, Binkley [place], Bischoff-Ferrari HA, et al.    Evaluation, treatment, and prevention of vitamin D    deficiency: an Endocrine Society clinical practice    guideline. JCEM. 2011 Jul; 96(7):1911-30.   05/01/2015 25.3 (L) 30.0 - 100.0 ng/mL Final    Comment:    Vitamin D deficiency has been defined by the Institute of Medicine and an Endocrine Society practice guideline as a level of serum 25-OH vitamin D less than 20 ng/mL (1,2). The Endocrine Society went on to further define vitamin D insufficiency as a level between 21 and 29 ng/mL (2). 1. IOM (Institute of Medicine). 2010. Dietary reference    intakes for calcium and D. [place]: The    [employer]. 2. Holick MF, Binkley [place], Bischoff-Ferrari HA, et al.    Evaluation, treatment, and prevention of vitamin  D    deficiency: an Endocrine Society clinical practice    guideline. JCEM. 2011 Jul; 96(7):1911-30.    Lymphocytes Absolute  Date Value Ref Range Status  11/20/2019 1.4 0 - 3 x10E3/uL Final  04/18/2019 1.3 0 - 3 x10E3/uL Final  10/25/2018 1.4 0 - 3 x10E3/uL Final    11/19/18  MRI of the brain with and without contrast shows the following: 1.    T2/flair hyperintense foci in the brainstem, cerebellum, left thalamus and hemispheres in a pattern and configuration consistent with chronic demyelinating plaque associated with multiple sclerosis.  None of the foci appear to be acute and there were no new lesions compared to the 2018 MRI. 2.    There is a normal enhancement pattern and there are no acute findings.  11/19/18 MRI of the cervical spine with and without contrast shows the following: 1.     There are about 8 T2 hyperintense foci within the spinal cord consistent with chronic demyelinating plaque associated with multiple sclerosis.  None of the foci appears to be acute.  They were all present on the MRI dated 11/05/2015. 2.     There are no significant cervical spine degenerative changes. 3.     There is a normal enhancement pattern and no acute findings.    Labs - ANA, ANCA, ACE, HIV, RPR - all negative  11/01/14 anti-JCV ab - 1.31 (H) positive  07/27/18 RLE u/s - No evidence of DVT and no evidence of venous insufficiency.    ASSESSMENT AND PLAN  44 y.o. year old female here with multiple focal neurologic attacks since 2008. Neurologic exam demonstrates long tract signs affecting the right arm and right leg more than left side. 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 Other labs negative.   On tecfidera since Feb 2016. Had flare up symptoms in Nov 2016 and Nov-Dec 2017. Also new plaque in Jan 2018. Now on ocrevus since May/June 2018.   Muscle spasms and leg pain (on baclofen, tizanidine and gabapentin).  Fatigue improved with medication regimen adjustment.   Bladder incontinence --> using adult pads and oxybutinin   Depression stable (seeing Dr. Lilian Kapur, [facility]).    Dx:  Multiple sclerosis (HCC) - Plan: MR BRAIN WO CONTRAST  Muscle spasm  Other fatigue     PLAN:  Multiple sclerosis disease  modifying therapy (established problem, progressive) - continue ocrevus; doing well on this medication; check CBC, CMP every 6 months (will hold off today due to low BP) - check MRI brain (surveillance) - continue multivitamin and vitamin D supplements  MEMORY LOSS - optimize nutrition and exercise - follow up with psychiatry (optimize depression / anxiety treatments; consider caffeine vs stimulant meds) - safety / supervision issues reviewed - caution with driving and finances  Muscle spasms (established problem, stable) - continue baclofen 20mg  three times per day for muscle spasms - continue tizanidine 4mg  three times per day for muscle spasms  Bilateral leg pain (established problem, stable) - continue gabapentin up to 600mg  three times per day  Bladder incontinence (established problem, improved) - consider follow up with refer to urology (tried and failed oxybutynin)  Depression / anxiety (established problem, stable) - per Dr. [facility]) - continue lexapro  Orders Placed This Encounter  Procedures  . MR BRAIN WO CONTRAST   Return in about 6 months (around 11/20/2020).    Lilian Kapur, MD 05/21/2020, 11:24 AM Certified in Neurology, Neurophysiology and Neuroimaging  [facility] [phone]  [address] [place], [place] [address] [phone]

## 2020-05-23 ENCOUNTER — Ambulatory Visit
Admission: RE | Admit: 2020-05-23 | Discharge: 2020-05-23 | Disposition: A | Discharge status: Home / Self Care | Payer: Medicare Other | Source: Ambulatory Visit | Attending: Diagnostic Neuroimaging | Admitting: Diagnostic Neuroimaging

## 2020-05-23 DIAGNOSIS — G35 Multiple sclerosis: Secondary | ICD-10-CM

## 2020-06-04 ENCOUNTER — Encounter: Payer: Self-pay | Admitting: *Deleted

## 2020-06-10 ENCOUNTER — Ambulatory Visit (HOSPITAL_COMMUNITY): Payer: Medicare Other

## 2020-06-11 ENCOUNTER — Telehealth: Payer: Self-pay | Admitting: Diagnostic Neuroimaging

## 2020-06-11 NOTE — Telephone Encounter (Signed)
[place] from pt care center called stating that the prescription that was sent to them for the pt's Ocrevus was expired and they are needing a new one sent to them. Pt will be coming it to see them tomorrow.  [phone]

## 2020-06-11 NOTE — Telephone Encounter (Signed)
[facility] signed and  faxed to [facility]. Ocrevus needs updated PA. PA on CMM, key: [id number].  Stable on Ocrevus since May 2018. MRI brain stable since 2018, MRI cervical spine stable since 2016. Failed Tecfidera, JCV positive so cannot take Tysabri.   [employer], spoke with Kathlene November for current insurance information. Medicare part D,  ID [id number], group [id number], BIN: [id number], PCN: [email or web address].our information has been submitted to [facility] Medicare Part D. [employer] Medicare Part D will review the request and will issue a decision, typically within 1-3 days from your submission. You can check the updated outcome later by reopening this request.  If [employer] Medicare Part D has not responded in 1-3 days or if you have any questions about your ePA request, please contact [employer] Medicare Part D at [phone]

## 2020-06-12 ENCOUNTER — Other Ambulatory Visit: Payer: Self-pay

## 2020-06-12 ENCOUNTER — Ambulatory Visit (HOSPITAL_COMMUNITY)
Admission: RE | Admit: 2020-06-12 | Discharge: 2020-06-12 | Disposition: A | Discharge status: Home / Self Care | Payer: Medicare Other | Source: Ambulatory Visit | Attending: Internal Medicine | Admitting: Internal Medicine

## 2020-06-12 DIAGNOSIS — G35 Multiple sclerosis: Secondary | ICD-10-CM | POA: Diagnosis present

## 2020-06-12 MED ORDER — DIPHENHYDRAMINE HCL 50 MG/ML IJ SOLN
50.0000 mg | Freq: Once | INTRAMUSCULAR | Status: AC
  Administered 2020-06-12: 50 mg via INTRAVENOUS
  Filled 2020-06-12: qty 1

## 2020-06-12 MED ORDER — SODIUM CHLORIDE 0.9 % IV SOLN
600.0000 mg | Freq: Once | INTRAVENOUS | Status: AC
  Administered 2020-06-12: 600 mg via INTRAVENOUS
  Filled 2020-06-12: qty 20

## 2020-06-12 MED ORDER — ACETAMINOPHEN 325 MG PO TABS
650.0000 mg | ORAL_TABLET | Freq: Once | ORAL | Status: AC
  Administered 2020-06-12: 650 mg via ORAL
  Filled 2020-06-12: qty 2

## 2020-06-12 MED ORDER — FAMOTIDINE IN NACL 20-0.9 MG/50ML-% IV SOLN
20.0000 mg | Freq: Once | INTRAVENOUS | Status: AC
  Administered 2020-06-12: 20 mg via INTRAVENOUS
  Filled 2020-06-12: qty 50

## 2020-06-12 MED ORDER — METHYLPREDNISOLONE SODIUM SUCC 125 MG IJ SOLR
125.0000 mg | Freq: Once | INTRAMUSCULAR | Status: AC
  Administered 2020-06-12: 125 mg via INTRAVENOUS
  Filled 2020-06-12: qty 2

## 2020-06-12 MED ORDER — SODIUM CHLORIDE 0.9 % IV SOLN
INTRAVENOUS | Status: DC | PRN
  Administered 2020-06-12: 250 mL via INTRAVENOUS

## 2020-06-12 NOTE — Telephone Encounter (Signed)
Received approval for Ocrevus from Medicare Part D, 03/13/20 - 06/11/2021. Approval letter was faxed to [facility] last night, 5:11 pm, confirmation received.

## 2020-06-12 NOTE — Progress Notes (Signed)
Patient received IV Ocrevus as ordered by Joycelyn Schmid MD. Pre infusion medications were given as ordered. Patient declined the 60 minutes post infusion obervation.Tolerated well, vitals stable, discharge instructions given, verbalized understanding. Patient alert, oriented and transported in a wheelchair on discharge.

## 2020-06-12 NOTE — Discharge Instructions (Signed)
Ocrelizumab injection °What is this medicine? °OCRELIZUMAB (ok re LIZ ue mab) treats multiple sclerosis. It helps to decrease the number of multiple sclerosis relapses. It is not a cure. °This medicine may be used for other purposes; ask your health care provider or pharmacist if you have questions. °COMMON BRAND NAME(S): OCREVUS °What should I tell my health care provider before I take this medicine? °They need to know if you have any of these conditions: °· cancer °· hepatitis B infection °· other infection (especially a virus infection such as chickenpox, cold sores, or herpes) °· an unusual or allergic reaction to ocrelizumab, other medicines, foods, dyes or preservatives °· pregnant or trying to get pregnant °· breast-feeding °How should I use this medicine? °This medicine is for infusion into a vein. It is given by a health care professional in a hospital or clinic setting. °A special MedGuide will be given to you before each treatment. Be sure to read this information carefully each time. °Talk to your pediatrician regarding the use of this medicine in children. Special care may be needed. °Overdosage: If you think you have taken too much of this medicine contact a poison control center or emergency room at once. °NOTE: This medicine is only for you. Do not share this medicine with others. °What if I miss a dose? °Keep appointments for follow-up doses as directed. It is important not to miss your dose. Call your doctor or health care professional if you are unable to keep an appointment. °What may interact with this medicine? °· alemtuzumab °· daclizumab °· dimethyl fumarate °· fingolimod °· glatiramer °· interferon beta °· live virus vaccines °· mitoxantrone °· natalizumab °· peginterferon beta °· rituximab °· steroid medicines like prednisone or cortisone °· teriflunomide °This list may not describe all possible interactions. Give your health care provider a list of all the medicines, herbs,  non-prescription drugs, or dietary supplements you use. Also tell them if you smoke, drink alcohol, or use illegal drugs. Some items may interact with your medicine. °What should I watch for while using this medicine? °Tell your doctor or healthcare professional if your symptoms do not start to get better or if they get worse. °This medicine can cause serious allergic reactions. To reduce your risk you may need to take medicine before treatment with this medicine. Take your medicine as directed. °Women should inform their doctor if they wish to become pregnant or think they might be pregnant. There is a potential for serious side effects to an unborn child. Talk to your health care professional or pharmacist for more information. Female patients should use effective birth control methods while receiving this medicine and for 6 months after the last dose. °Call your doctor or health care professional for advice if you get a fever, chills or sore throat, or other symptoms of a cold or flu. Do not treat yourself. This drug decreases your body's ability to fight infections. Try to avoid being around people who are sick. °If you have a hepatitis B infection or a history of a hepatitis B infection, talk to your doctor. The symptoms of hepatitis B may get worse if you take this medicine. °In some patients, this medicine may cause a serious brain infection that may cause death. If you have any problems seeing, thinking, speaking, walking, or standing, tell your doctor right away. If you cannot reach your doctor, urgently seek other source of medical care. °This medicine can decrease the response to a vaccine. If you need to get   vaccinated, tell your healthcare professional if you have received this medicine. Extra booster doses may be needed. Talk to your doctor to see if a different vaccination schedule is needed. °Talk to your doctor about your risk of cancer. You may be more at risk for certain types of cancers if you  take this medicine. °What side effects may I notice from receiving this medicine? °Side effects that you should report to your doctor or health care professional as soon as possible: °· allergic reactions like skin rash, itching or hives, swelling of the face, lips, or tongue °· breathing problems °· facial flushing °· fast, irregular heartbeat °· lump or soreness in the breast °· signs and symptoms of herpes such as cold sore, shingles, or genital sores °· signs and symptoms of infection like fever or chills, cough, sore throat, pain or trouble passing urine °· signs and symptoms of low blood pressure like dizziness; feeling faint or lightheaded, falls; unusually weak or tired °· signs and symptoms of progressive multifocal leukoencephalopathy (PML) like changes in vision; clumsiness; confusion; personality changes; weakness on one side of the body °· swelling of the ankles, feet, hands °Side effects that usually do not require medical attention (report these to your doctor or health care professional if they continue or are bothersome): °· back pain °· depressed mood °· diarrhea °· pain, redness, or irritation at site where injected °This list may not describe all possible side effects. Call your doctor for medical advice about side effects. You may report side effects to FDA at 1-800-FDA-1088. °Where should I keep my medicine? °This drug is given in a hospital or clinic and will not be stored at home. °NOTE: This sheet is a summary. It may not cover all possible information. If you have questions about this medicine, talk to your doctor, pharmacist, or health care provider. °© 2020 Elsevier/Gold Standard (2018-11-28 07:41:53) ° °

## 2020-06-26 ENCOUNTER — Telehealth: Payer: Self-pay | Admitting: Diagnostic Neuroimaging

## 2020-06-26 NOTE — Telephone Encounter (Signed)
Called [facility] RN and advised her she may order incontinence supplies. She stated they have to call every 60 days for new order per Medicaid. She verbalized understanding, appreciation.

## 2020-06-26 NOTE — Telephone Encounter (Signed)
Melodye Ped the [facility] Nurse for the pt called wanting to know if it is ok to go ahead and order the pt's incontinence supplies. Please advise.

## 2020-07-26 ENCOUNTER — Telehealth: Payer: Self-pay | Admitting: Diagnostic Neuroimaging

## 2020-07-26 NOTE — Telephone Encounter (Signed)
Called patient who hasn't had Covid vaccine at all. She is nervous now with the Delta variant, so she is asking if Dr Marjory Lies thinks it is now okay for her to get Covid vaccine since it's been around for a while. I advised will send her question and let her know.  Patient verbalized understanding, appreciation.

## 2020-07-26 NOTE — Telephone Encounter (Signed)
Pt would like to know if safe for her to get the vaccine. Would like a call back from the nurse.

## 2020-07-26 NOTE — Telephone Encounter (Signed)
Called patient and advised her of Dr [employer] message. Her last Ocrevus infusion was July, so she will get Covid vaccine in Oct. She  verbalized understanding, appreciation.

## 2020-07-26 NOTE — Telephone Encounter (Signed)
Yes; please go ahead with vaccine; it is best to take 3 months or more after the ocrevus dose. -VRP

## 2020-09-17 MED ORDER — PREDNISONE 10 MG PO TABS: ORAL_TABLET | ORAL | 0 refills | Status: DC

## 2020-09-17 NOTE — Telephone Encounter (Signed)
Meds ordered this encounter  Medications  . predniSONE (DELTASONE) 10 MG tablet    Sig: Take 60mg  on day 1. Reduce by 10mg  each subsequent day. (60, 50, 40, 30, 20, 10, stop)    Dispense:  21 tablet    Refill:  0    , MD 09/17/2020, 4:57 PM Certified in Neurology, Neurophysiology and Neuroimaging  [facility] [address], [address] [place], [address] [place] [phone]

## 2020-11-26 ENCOUNTER — Ambulatory Visit: Payer: Medicare Other | Admitting: Diagnostic Neuroimaging

## 2020-12-04 ENCOUNTER — Ambulatory Visit: Payer: Medicare Other | Admitting: Diagnostic Neuroimaging

## 2020-12-10 ENCOUNTER — Ambulatory Visit (INDEPENDENT_AMBULATORY_CARE_PROVIDER_SITE_OTHER): Payer: Medicare Other | Admitting: Diagnostic Neuroimaging

## 2020-12-10 ENCOUNTER — Other Ambulatory Visit: Payer: Self-pay

## 2020-12-10 ENCOUNTER — Encounter: Payer: Self-pay | Admitting: Diagnostic Neuroimaging

## 2020-12-10 VITALS — BP 120/76 | HR 80 | Ht 68.0 in | Wt 308.2 lb

## 2020-12-10 DIAGNOSIS — G35 Multiple sclerosis: Secondary | ICD-10-CM

## 2020-12-10 DIAGNOSIS — R5383 Other fatigue: Secondary | ICD-10-CM | POA: Diagnosis not present

## 2020-12-10 DIAGNOSIS — M62838 Other muscle spasm: Secondary | ICD-10-CM

## 2020-12-10 NOTE — Patient Instructions (Addendum)
  Multiple sclerosis - continue ocrevus - check CBC, CMP every 6 months - continue multivitamin and vitamin D supplements  COVID VACCINATION - completed moderna vaccine x 2 doses (last dose in Oct 2021) - recommend to get 3rd shot (primary dose) Moderna asap, before next ocrevus dosing

## 2020-12-10 NOTE — Progress Notes (Signed)
[facility]  PATIENT: Ebony Warren DOB: 1976-09-29  REFERRING CLINICIAN: Williams  HISTORY FROM: patient  REASON FOR VISIT: follow up   HISTORICAL  CHIEF COMPLAINT:  Chief Complaint  Patient presents with  . Multiple Sclerosis    Pt is here for a follow up. Patient reports that she has recently started having a lot of weakness in her left leg. Her last Ocrevus infusion was in June 2021, she is due but has not heard from [facility] about scheduling.     HISTORY OF PRESENT ILLNESS:   UPDATE (12/10/20, VRP): Since last visit, more left leg weakness and gait difficulty. Requesting evaluation for electric wheelchair / power chair. Symptoms are progressive. Severity is moderate. No alleviating or aggravating factors. Tolerating meds / ocrevus. Here for face-face evaluation of power wheelchair. Cannot perform ADLs due to mobility issues. Cannot propel manual wheelchair due to hand weakness. Living at home with son and mother. Has personal care aid. Not driving currently.   UPDATE (05/21/20, VRP): Since last visit, doing fair. More depression, spasms. Had event last week of body trembling, no LOC, lasting 10 minutes. Symptoms are progressive. Tolerating ocrevus and muscle relaxers.  UPDATE (11/20/19, VRP): Here for face-face evaluation of wheelchair need. Currently using a wheelchair due to MS and mobility impairment, now needs a replacement. Patient able to propel wheelchair. Overall, since last visit, doing fair. Continues on ocrevus; had mild reaction at IV site last time. No other alleviating or aggravating factors. Tolerating meds.    UPDATE (10/25/18, VRP): Since last visit, doing about the same. Symptoms are stable EXCEPT right hand weakness and coordination is worse. Some right shoulder twitching. Memory loss continues. No alleviating or aggravating factors. Tolerating meds.    UPDATE (04/18/18, VRP): Since last visit, doing well on ocrevus, except had  another fall. Recently more urine incont, and right leg swelling. Tolerating meds. No alleviating or aggravating factors.   UPDATE (10/18/17, VRP): Since last visit, doing well. Tolerating ocrevus. No alleviating or aggravating factors. No new events. C/o cough and dry mouth issues.   UPDATE 05/19/17: Since last visit, now transitioned to ocrevus (May 21, June 4). Did well with infusion. Spasms stable. Left leg slightly better. Bladder slightly better. Depression stable. Using rollator walker. No other triggering factors. Overall doing well.   UPDATE 02/08/17: Since last visit, MRI brain shows 1 new lesion. Here to discuss ocrevus. Also with more left leg pain, muscle spasms, gait diff, fatigue. Right ear pain better. Bladder incont slightly better with oxybutinin initially, but not anymore.   UPDATE 12/22/16: Since last visit, was doing well until Nov 2017 and then developed stabbing right ear pain (continues to present). Also with new and increasing spasm and cramps in right hand and left leg since Dec 2017. Continues with fatigue and bladder incontinence.  UPDATE 05/13/16: Since last visit, doing well. No new MS events or symptoms. In retrospect, may have lost vision in right eye (2015, and possibly March 2016), lasting 3-7 days. Didn't mention to me until now. Recently this was noted optometry and ophthalmology exams.   UPDATE 01/27/16: Since last visit, fatigue improved with adjusting medication timing. Bladder issues stable. Had UTI at last visit, tx'd with abx, now better.  UPDATE 10/22/15: Since last visit was doing well. Then last week had new onset weakness in legs, bladder incontinence, strong smelling urine, and hand tremors. Now sxs almost fully resolved since yesterday.  UPDATE 05/01/15: Symptoms stable. Tolerating tecfidera.  UPDATE 01/25/15: Since last visit, had  more problems for a few weeks (lower ext weakness, bladder incont), but now improved. Now on tecfidera x 3 weeks. Some itching and  stomach issues with tecfidera, but mild. Main issues now consist of lower extremity pain, spasms, urinary leakage, daytime fatigue.  UPDATE 10/31/14: Since last visit, sxs are stable. Test results and diagnosis reviewed. No new events. Using cane to walk.  UPDATE (10/02/14): 45 year old right-handed female here for evaluation of double vision. 2008 patient was 6 months pregnant with her son, when all of a sudden she didn't feel good. She noticed right hand clumsiness and incoordination. This lasted for approximately 9 months and then stopped. She did not seek medical attention for this problem. 2013 patient had onset of right arm weakness, right leg weakness, numbness on the right side, intermittent shaking sensation all over. Patient was evaluated in [place] and diagnosed with possible stroke. 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 Patient did not recover right arm or leg strength. Her speech was also affected. October 2014 patient moved to [facility]. 08/30/2014 patient was at home hanging clothes over the bathtub when she fell down. She is not sure what caused her fall. Her right leg may have given out. Patient fell and struck her head against the soap dish which broke off. Patient did not lose consciousness. Patient's family advised her to go to the emergency room. Patient was evaluated with CT and MRI of the brain. No acute findings were found. MRI of the brain did show multiple white matter lesions suspicious for chronic multiple sclerosis versus chronic small vessel ischemic disease. Advised to follow-up in outpatient basis. Since this fall patient has noticed some additional blurred vision, and double vision especially when looking to the left side.   REVIEW OF SYSTEMS: Full 14 system review of systems performed and negative except for: as per HPI.    ALLERGIES: Allergies  Allergen Reactions  . Morphine Itching  . Orange Fruit [Citrus]     Acidic foods Makes her itchy and her face  "feels like its on fire", have rash.  . Oxycodone Itching    rash  . Percocet [Oxycodone-Acetaminophen]     Stomach cramps   . Vicodin [Hydrocodone-Acetaminophen]     Stomach cramps   . Other Rash    Narcotics. Spicy foods (cause seizures)    HOME MEDICATIONS: Outpatient Medications Prior to Visit  Medication Sig Dispense Refill  . acetaminophen (TYLENOL) 325 MG tablet Take 650 mg by mouth daily as needed for mild pain.    . ARIPiprazole (ABILIFY) 5 MG tablet Take 5 mg by mouth daily.    . baclofen (LIORESAL) 10 MG tablet TAKE 2 TABLETS(20 MG) BY MOUTH THREE TIMES DAILY 180 tablet 11  . cholecalciferol (VITAMIN D) 1000 UNITS tablet Take 1,000 Units by mouth daily. Take one a day for 6-12 months and we will recheck your levels when you come back in the office in 6 months    . diphenhydrAMINE (BENADRYL) 25 mg capsule Take 25 mg by mouth every 6 (six) hours as needed. allergies    . escitalopram (LEXAPRO) 20 MG tablet Take 20 mg by mouth.    . gabapentin (NEURONTIN) 300 MG capsule TAKE 2 CAPSULES(600 MG) BY MOUTH THREE TIMES DAILY 180 capsule 12  . LORazepam (ATIVAN) 1 MG tablet Take 1 mg by mouth daily.    . Multiple Vitamins-Minerals (MULTIVITAMIN ADULT PO) Take 1 tablet by mouth daily.    Marland Kitchen ocrelizumab (OCREVUS) 300 MG/10ML injection Infuse 600 mg IV Every 6 months 20  mL 1  . tiZANidine (ZANAFLEX) 4 MG tablet TAKE 1 TABLET(4 MG) BY MOUTH THREE TIMES DAILY 90 tablet 12  . ARIPiprazole (ABILIFY) 2 MG tablet Take 5 mg by mouth daily.    . furosemide (LASIX) 20 MG tablet Take 20 mg by mouth every morning.    . predniSONE (DELTASONE) 10 MG tablet Take 60mg  on day 1. Reduce by 10mg  each subsequent day. (60, 50, 40, 30, 20, 10, stop) 21 tablet 0   No facility-administered medications prior to visit.    PAST MEDICAL HISTORY: Past Medical History:  Diagnosis Date  . Anxiety and depression   . Bradycardia   . Multiple sclerosis (HCC)   . Seizures (HCC)    told that they are not from  the brain but stress related    PAST SURGICAL HISTORY: Past Surgical History:  Procedure Laterality Date  . CHOLECYSTECTOMY    . KNEE SURGERY      FAMILY HISTORY: Family History  Problem Relation Age of Onset  . Hypertension Mother   . Atrial fibrillation Mother   . Hypertension Father   . Atrial fibrillation Maternal Grandmother     SOCIAL HISTORY:  Social History   Socioeconomic History  . Marital status: Single    Spouse name: Not on file  . Number of children: 1  . Years of education: College  . Highest education level: Not on file  Occupational History    Employer: OTHER    Comment: disabled  Tobacco Use  . Smoking status: Former Smoker    Packs/day: 0.35    Years: 3.00    Pack years: 1.05    Types: Cigarettes    Quit date: 12/07/2006    Years since quitting: 14.0  . Smokeless tobacco: Never Used  Substance and Sexual Activity  . Alcohol use: No    Alcohol/week: 0.0 standard drinks    Comment: quit: 2013 (socially)  . Drug use: Yes    Types: Marijuana    Comment: quit: 2001   . Sexual activity: Not on file  Other Topics Concern  . Not on file  Social History Narrative   Patient lives at home with family.   Caffeine Use: drinks a cup of hot tea with tecfidera in the am   Patient is right handed.   Patient has a college education   Social Determinants of 2014 Strain: Not on file  Food Insecurity: Not on file  Transportation Needs: Not on file  Physical Activity: Not on file  Stress: Not on file  Social Connections: Not on file  Intimate Partner Violence: Not on file     PHYSICAL EXAM  Vitals:   12/10/20 1112  BP: 120/76  Pulse: 80  Weight: (!) 308 lb 3.2 oz (139.8 kg)  Height: 5\' 8"  (1.727 m)   No exam data present   Body mass index is 46.86 kg/m.  GENERAL EXAM: Patient is in no distress; well developed, nourished and groomed; neck is supple  CARDIOVASCULAR: Regular rate and rhythm, no murmurs, no carotid  bruits  NEUROLOGIC: MENTAL STATUS: awake, alert, language fluent, comprehension intact, naming intact, fund of knowledge appropriate; SLOW SCANNING SPEECH PATTERN CRANIAL NERVE: PUPILS PINPOINT AND REACTIVE; visual fields full to confrontation, extraocular muscles intact; NO PTOSIS; facial sensation symmetric, FACE --> DEC LEFT NL FOLD, hearing intact, palate elevates symmetrically, uvula midline, shoulder shrug symmetric, tongue midline. MOTOR: INCREASED TONE IN BUE and BLE; BUE 3 PROX AND 2-3 DISTAL; BLE 2 PROX, 3 DISTAL SENSORY:  DECR IN RIGHT HAND AND RIGHT FOOT TO ALL MODALITIES COORDINATION: BUE DYSMETRIA; SLOW FINGER AND FOOT TAP ON RIGHT REFLEXES: BUE 2; BLE KNEES 3, ANKLES 2 GAIT/STATION: IN MANUAL WHEELCHAIR; HAS ROLLATOR    DIAGNOSTIC DATA (LABS, IMAGING, TESTING) - I reviewed patient records, labs, notes, testing and imaging myself where available.  Lab Results  Component Value Date   WBC 6.5 11/20/2019   HGB 13.3 11/20/2019   HCT 40.3 11/20/2019   MCV 95 11/20/2019   PLT 298 11/20/2019      Component Value Date/Time   NA 140 11/20/2019 1339   K 4.5 11/20/2019 1339   CL 105 11/20/2019 1339   CO2 23 11/20/2019 1339   GLUCOSE 82 11/20/2019 1339   GLUCOSE 91 08/30/2014 1031   BUN 14 11/20/2019 1339   CREATININE 0.91 11/20/2019 1339   CALCIUM 9.4 11/20/2019 1339   PROT 6.5 11/20/2019 1339   ALBUMIN 3.7 (L) 11/20/2019 1339   AST 13 11/20/2019 1339   ALT 9 11/20/2019 1339   ALKPHOS 96 11/20/2019 1339   BILITOT 0.3 11/20/2019 1339   GFRNONAA 78 11/20/2019 1339   GFRAA 90 11/20/2019 1339   No results found for: CHOL No results found for: HGBA1C No results found for: VITAMINB12 No results found for: TSH  Vit D, 25-Hydroxy  Date Value Ref Range Status  12/22/2016 34.1 30.0 - 100.0 ng/mL Final    Comment:    Vitamin D deficiency has been defined by the Institute of Medicine and an Endocrine Society practice guideline as a level of serum 25-OH vitamin D less than  20 ng/mL (1,2). The Endocrine Society went on to further define vitamin D insufficiency as a level between 21 and 29 ng/mL (2). 1. IOM (Institute of Medicine). 2010. Dietary reference    intakes for calcium and D. [place]: The    [employer]. 2. Holick MF, Binkley Von [place]y, Bischoff-Ferrari HA, et al.    Evaluation, treatment, and prevention of vitamin D    deficiency: an Endocrine Society clinical practice    guideline. JCEM. 2011 Jul; 96(7):1911-30.   10/22/2015 35.7 30.0 - 100.0 ng/mL Final    Comment:    Vitamin D deficiency has been defined by the Institute of Medicine and an Endocrine Society practice guideline as a level of serum 25-OH vitamin D less than 20 ng/mL (1,2). The Endocrine Society went on to further define vitamin D insufficiency as a level between 21 and 29 ng/mL (2). 1. IOM (Institute of Medicine). 2010. Dietary reference    intakes for calcium and D. [place]: The    [employer]. 2. Holick MF, Binkley Grundy, Bischoff-Ferrari HA, et al.    Evaluation, treatment, and prevention of vitamin D    deficiency: an Endocrine Society clinical practice    guideline. JCEM. 2011 Jul; 96(7):1911-30.   05/01/2015 25.3 (L) 30.0 - 100.0 ng/mL Final    Comment:    Vitamin D deficiency has been defined by the Institute of Medicine and an Endocrine Society practice guideline as a level of serum 25-OH vitamin D less than 20 ng/mL (1,2). The Endocrine Society went on to further define vitamin D insufficiency as a level between 21 and 29 ng/mL (2). 1. IOM (Institute of Medicine). 2010. Dietary reference    intakes for calcium and D. [place]: The    [employer]. 2. Holick MF, Binkley , Bischoff-Ferrari HA, et al.    Evaluation, treatment, and prevention of vitamin D    deficiency: an  Endocrine Society clinical practice    guideline. JCEM. 2011 Jul; 96(7):1911-30.    Lymphocytes Absolute  Date Value Ref Range Status  11/20/2019  1.4 0.7 - 3.1 x10E3/uL Final  04/18/2019 1.3 0.7 - 3.1 x10E3/uL Final  10/25/2018 1.4 0.7 - 3.1 x10E3/uL Final    11/19/18 MRI of the brain with and without contrast shows the following: 1.    T2/flair hyperintense foci in the brainstem, cerebellum, left thalamus and hemispheres in a pattern and configuration consistent with chronic demyelinating plaque associated with multiple sclerosis.  None of the foci appear to be acute and there were no new lesions compared to the 2018 MRI. 2.    There is a normal enhancement pattern and there are no acute findings.  11/19/18 MRI of the cervical spine with and without contrast shows the following: 1.     There are about 8 T2 hyperintense foci within the spinal cord consistent with chronic demyelinating plaque associated with multiple sclerosis.  None of the foci appears to be acute.  They were all present on the MRI dated 11/05/2015. 2.     There are no significant cervical spine degenerative changes. 3.     There is a normal enhancement pattern and no acute findings.  05/23/20 MRI brain MRI brain without contrast demonstrating: -Mild stable periventricular, pericallosal and subcortical foci of chronic demyelinating plaques. -No acute finding.   Labs - ANA, ANCA, ACE, HIV, RPR - all negative  11/01/14 anti-JCV ab - 1.31 (H) positive  07/27/18 RLE u/s - No evidence of DVT and no evidence of venous insufficiency.    ASSESSMENT AND PLAN  45 y.o. year old female here with multiple focal neurologic attacks since 2008. Neurologic exam demonstrates long tract signs affecting the right arm and right leg more than left side. 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 Other labs negative.   On tecfidera since Feb 2016. Had flare up symptoms in Nov 2016 and Nov-Dec 2017. Also new plaque in Jan 2018. Now on ocrevus since May/June 2018.   Muscle spasms and leg pain (on baclofen, tizanidine and gabapentin).  Fatigue  improved with medication regimen adjustment.   Bladder incontinence --> using adult pads and oxybutinin   Depression stable (seeing Dr. Sherryle Lis, [facility]).    Dx:  No diagnosis found.     PLAN:  Multiple sclerosis disease modifying therapy (established problem, progressive) - continue ocrevus - check CBC, CMP every 6 months - continue multivitamin and vitamin D supplements - face-face evaluation for power wheelchair today; will order home health evaluation as well  COVID VACCINATION - completed moderna vaccine x 2 doses (last in Oct 2021) - recommend to get 3rd shot (primary dose) Moderna asap, before next ocrevus dosing  MEMORY LOSS - optimize nutrition and exercise - follow up with psychiatry (optimize depression / anxiety treatments; consider caffeine vs stimulant meds) - safety / supervision issues reviewed - caution with driving and finances  Muscle spasms (established problem, stable) - continue baclofen 20mg  three times per day for muscle spasms - continue tizanidine 4mg  three times per day for muscle spasms  Bilateral leg pain (established problem, stable) - continue gabapentin up to 600mg  three times per day  Bladder incontinence (established problem, improved) - consider follow up with refer to urology (tried and failed oxybutynin)  Depression / anxiety (established problem, stable) - per Dr. Sherryle Lis [facility]) - continue lexapro  Orders Placed This Encounter  Procedures  . CBC with Differential/Platelet  .  Comprehensive metabolic panel  . Home Health  . Face-to-face encounter (required for Medicare/Medicaid patients)   Return in about 6 months (around 06/09/2021).    Suanne Marker, MD 12/10/2020, 11:44 AM Certified in Neurology, Neurophysiology and Neuroimaging  [facility] [address], [address] [place], [place] [address] [phone]

## 2020-12-11 ENCOUNTER — Encounter: Payer: Self-pay | Admitting: *Deleted

## 2020-12-11 LAB — COMPREHENSIVE METABOLIC PANEL
ALT: 12 IU/L (ref 0–32)
AST: 12 IU/L (ref 0–40)
Albumin/Globulin Ratio: 1.4 (ref 1.2–2.2)
Albumin: 3.9 g/dL (ref 3.8–4.8)
Alkaline Phosphatase: 88 IU/L (ref 44–121)
BUN/Creatinine Ratio: 12 (ref 9–23)
BUN: 11 mg/dL (ref 6–24)
Bilirubin Total: 0.4 mg/dL (ref 0.0–1.2)
CO2: 23 mmol/L (ref 20–29)
Calcium: 9.4 mg/dL (ref 8.7–10.2)
Chloride: 104 mmol/L (ref 96–106)
Creatinine, Ser: 0.93 mg/dL (ref 0.57–1.00)
GFR calc Af Amer: 87 mL/min/{1.73_m2} (ref >59)
GFR calc non Af Amer: 76 mL/min/{1.73_m2} (ref >59)
Globulin, Total: 2.8 g/dL (ref 1.5–4.5)
Glucose: 86 mg/dL (ref 65–99)
Potassium: 4.3 mmol/L (ref 3.5–5.2)
Sodium: 139 mmol/L (ref 134–144)
Total Protein: 6.7 g/dL (ref 6.0–8.5)

## 2020-12-11 LAB — CBC WITH DIFFERENTIAL/PLATELET
Basophils Absolute: 0 10*3/uL (ref 0.0–0.2)
Basos: 0 %
EOS (ABSOLUTE): 0 10*3/uL (ref 0.0–0.4)
Eos: 1 %
Hematocrit: 42.9 % (ref 34.0–46.6)
Hemoglobin: 14.2 g/dL (ref 11.1–15.9)
Immature Grans (Abs): 0 10*3/uL (ref 0.0–0.1)
Immature Granulocytes: 0 %
Lymphocytes Absolute: 1.5 10*3/uL (ref 0.7–3.1)
Lymphs: 25 %
MCH: 31.2 pg (ref 26.6–33.0)
MCHC: 33.1 g/dL (ref 31.5–35.7)
MCV: 94 fL (ref 79–97)
Monocytes Absolute: 0.4 10*3/uL (ref 0.1–0.9)
Monocytes: 7 %
Neutrophils Absolute: 4 10*3/uL (ref 1.4–7.0)
Neutrophils: 67 %
Platelets: 280 10*3/uL (ref 150–450)
RBC: 4.55 x10E6/uL (ref 3.77–5.28)
RDW: 12.1 % (ref 11.7–15.4)
WBC: 5.9 10*3/uL (ref 3.4–10.8)

## 2020-12-17 ENCOUNTER — Telehealth: Payer: Self-pay | Admitting: *Deleted

## 2020-12-17 NOTE — Telephone Encounter (Addendum)
Received message from Clydene Fake, RN, [facility] stating she needs new Ocrevus orders. Patient has infusion tomorrow. Orders faxed to [name]. Received confirmation.

## 2020-12-18 ENCOUNTER — Ambulatory Visit (HOSPITAL_COMMUNITY)
Admission: RE | Admit: 2020-12-18 | Discharge: 2020-12-18 | Disposition: A | Discharge status: Home / Self Care | Payer: Medicare Other | Source: Ambulatory Visit | Attending: Internal Medicine | Admitting: Internal Medicine

## 2020-12-18 ENCOUNTER — Other Ambulatory Visit: Payer: Self-pay

## 2020-12-18 DIAGNOSIS — G35 Multiple sclerosis: Secondary | ICD-10-CM | POA: Diagnosis present

## 2020-12-18 MED ORDER — DIPHENHYDRAMINE HCL 50 MG/ML IJ SOLN
50.0000 mg | Freq: Once | INTRAMUSCULAR | Status: AC
  Administered 2020-12-18: 50 mg via INTRAVENOUS
  Filled 2020-12-18: qty 1

## 2020-12-18 MED ORDER — ACETAMINOPHEN 325 MG PO TABS
650.0000 mg | ORAL_TABLET | Freq: Once | ORAL | Status: AC
  Administered 2020-12-18: 650 mg via ORAL
  Filled 2020-12-18: qty 2

## 2020-12-18 MED ORDER — FAMOTIDINE IN NACL 20-0.9 MG/50ML-% IV SOLN
20.0000 mg | Freq: Once | INTRAVENOUS | Status: AC
  Administered 2020-12-18: 20 mg via INTRAVENOUS
  Filled 2020-12-18: qty 50

## 2020-12-18 MED ORDER — SODIUM CHLORIDE 0.9 % IV SOLN
600.0000 mg | Freq: Once | INTRAVENOUS | Status: AC
  Administered 2020-12-18: 600 mg via INTRAVENOUS
  Filled 2020-12-18: qty 20

## 2020-12-18 MED ORDER — METHYLPREDNISOLONE SODIUM SUCC 125 MG IJ SOLR
100.0000 mg | Freq: Once | INTRAMUSCULAR | Status: AC
  Administered 2020-12-18: 100 mg via INTRAVENOUS
  Filled 2020-12-18: qty 2

## 2020-12-18 MED ORDER — SODIUM CHLORIDE 0.9 % IV SOLN
INTRAVENOUS | Status: DC | PRN
  Administered 2020-12-18: 250 mL via INTRAVENOUS

## 2020-12-18 NOTE — Progress Notes (Signed)
Patient received IV Ocrevus as ordered by Joycelyn Schmid MD. Pre infusion medications were given. Ocrevus was titrated per protocol. Declined the 1 hour post infusion observation. Tolerated well, vitals stable.  Alert, oriented and transported in a wheelchair at the time of discharge.

## 2021-01-15 ENCOUNTER — Telehealth: Payer: Self-pay | Admitting: Diagnostic Neuroimaging

## 2021-01-15 NOTE — Telephone Encounter (Signed)
Called Amy with [facility], gave verbal approval to make change in patient's incontinence supplies. She Patient verbalized understanding, appreciation.

## 2021-01-15 NOTE — Telephone Encounter (Signed)
[facility] (Amy) called, need verbal approval to change incontinent supplies. Changing to Large to XL, amount 1 case to 2 cases every episode.  Contact info: [phone], Ext [phone]

## 2021-01-22 ENCOUNTER — Other Ambulatory Visit: Payer: Self-pay

## 2021-01-22 ENCOUNTER — Ambulatory Visit (INDEPENDENT_AMBULATORY_CARE_PROVIDER_SITE_OTHER): Payer: Medicare Other | Admitting: Internal Medicine

## 2021-01-22 ENCOUNTER — Encounter: Payer: Self-pay | Admitting: Internal Medicine

## 2021-01-22 VITALS — BP 133/84 | HR 68 | Temp 97.8°F | Ht 68.0 in | Wt 309.3 lb

## 2021-01-22 DIAGNOSIS — Z1231 Encounter for screening mammogram for malignant neoplasm of breast: Secondary | ICD-10-CM | POA: Diagnosis not present

## 2021-01-22 DIAGNOSIS — U071 COVID-19: Secondary | ICD-10-CM | POA: Diagnosis not present

## 2021-01-22 DIAGNOSIS — K219 Gastro-esophageal reflux disease without esophagitis: Secondary | ICD-10-CM

## 2021-01-22 DIAGNOSIS — Z6834 Body mass index (BMI) 34.0-34.9, adult: Secondary | ICD-10-CM | POA: Insufficient documentation

## 2021-01-22 DIAGNOSIS — Z7689 Persons encountering health services in other specified circumstances: Secondary | ICD-10-CM

## 2021-01-22 HISTORY — DX: Persons encountering health services in other specified circumstances: Z76.89

## 2021-01-22 HISTORY — DX: Encounter for screening mammogram for malignant neoplasm of breast: Z12.31

## 2021-01-22 MED ORDER — PANTOPRAZOLE SODIUM 40 MG PO TBEC: 40.0000 mg | DELAYED_RELEASE_TABLET | Freq: Every day | ORAL | 3 refills | Status: DC

## 2021-01-22 MED ORDER — BENZONATATE 100 MG PO CAPS: 100.0000 mg | ORAL_CAPSULE | Freq: Three times a day (TID) | ORAL | 1 refills | Status: DC | PRN

## 2021-01-22 NOTE — Assessment & Plan Note (Signed)
Patient is new in clinic. No prior screening MM.   -Screening bilateral MM

## 2021-01-22 NOTE — Progress Notes (Signed)
New Patient Office Visit  Subjective:  Patient ID: Ebony Warren, female    DOB: April 02, 1976  Age: 45 y.o. MRN: [id number]  CC: Establish care   HPI Ebony Warren presents to establish care and to discuss breast reduction, reflux  Past medical history: Multiple sclerosis (Diagnosed 10/2014), depression, anxiety, urinary incontinency, Covid infection January 16/22, tested negative 01/11/2021.  Medications: Abilify 5 mg daily, Lexapro 20 mg daily, gabapentin 600 mg 3 times daily, Ativan 1 mg daily, multivitamins, ocrelizumab 600 mg IV every 6 months, tizanidine 4 mg 3 times daily, baclofen 20 mg 3 times daily, vitamin D 1000 unit daily  Social history: Smoking Hx: Never Alcohol use: None Illicit drug use: Never Lives in [place], lives with her family. She ambulates with walker  FHx: Afib in mother. No Fhx of cancer   Past Medical History:  Diagnosis Date  . Anxiety and depression   . Bradycardia   . Multiple sclerosis (HCC)   . Seizures (HCC)    told that they are not from the brain but stress related    Past Surgical History:  Procedure Laterality Date  . CHOLECYSTECTOMY    . KNEE SURGERY      Family History  Problem Relation Age of Onset  . Hypertension Mother   . Atrial fibrillation Mother   . Hypertension Father   . Atrial fibrillation Maternal Grandmother     Social History   Socioeconomic History  . Marital status: Single    Spouse name: Not on file  . Number of children: 1  . Years of education: College  . Highest education level: Not on file  Occupational History    Employer: OTHER    Comment: disabled  Tobacco Use  . Smoking status: Former Smoker    Packs/day: 0.35    Years: 3.00    Pack years: 1.05    Types: Cigarettes    Quit date: 12/07/2006    Years since quitting: 14.1  . Smokeless tobacco: Never Used  Substance and Sexual Activity  . Alcohol use: No    Alcohol/week: 0.0 standard drinks    Comment: quit: 2013  (socially)  . Drug use: Yes    Types: Marijuana    Comment: quit: 2001   . Sexual activity: Not on file  Other Topics Concern  . Not on file  Social History Narrative   Patient lives at home with family.   Caffeine Use: drinks a cup of hot tea with tecfidera in the am   Patient is right handed.   Patient has a college education   Social Determinants of Corporate investment banker Strain: Not on file  Food Insecurity: Not on file  Transportation Needs: Not on file  Physical Activity: Not on file  Stress: Not on file  Social Connections: Not on file  Intimate Partner Violence: Not on file    ROS Review of Systems   Negative except as mentioned in HPI and assessment and plan.  Objective:   Today's Vitals: BP 133/84 (BP Location: Left Arm, Patient Position: Sitting, Cuff Size: Normal)   Pulse 68   Temp 97.8 F (36.6 C) (Oral)   Ht 5\' 8"  (1.727 m)   Wt (!) 309 lb 4.8 oz (140.3 kg)   SpO2 100%   BMI 47.03 kg/m   Physical Exam Constitutional:      General: She is not in acute distress.    Appearance: She is obese.  Cardiovascular:     Rate and Rhythm: Normal rate and  regular rhythm.     Pulses: Normal pulses.     Heart sounds: Normal heart sounds. No murmur heard.   Pulmonary:     Effort: Pulmonary effort is normal.     Breath sounds: Normal breath sounds. No wheezing or rales.  Abdominal:     Palpations: Abdomen is soft.     Tenderness: There is no abdominal tenderness.  Skin:    General: Skin is warm and dry.  Neurological:     Motor: Weakness present.     Comments: Right upper and lower extremity weakness (chronic)  Psychiatric:        Mood and Affect: Mood normal.        Behavior: Behavior normal.        Thought Content: Thought content normal.        Judgment: Judgment normal.     Assessment & Plan:   Problem List Items Addressed This Visit      Digestive   Gastroesophageal reflux disease - Primary    Patient reports reflux mostly at night and  also during the day after meals. She almost threw up twice at night due to reflux. No other red flag. Has good appetite. No weight loss. No abdomina pain. No N/V during the day. No early satiety. No FHx of cancer. Will try PPI and reevaluate in 2-4 weeks  -Protonix 40 mg QD -F/u in clinic in 2-4 weeks        Relevant Medications   benzonatate (TESSALON PERLES) 100 MG capsule   pantoprazole (PROTONIX) 40 MG tablet     Other   COVID-19 virus infection    Patient reports recent COVID-19 infection with positive test 12/22/20. She had mild-moderate symptoms. It was managed at home. Her repeat COVID test 01/15/21 was negative. She is doing well except she still has dry cough. No SOB and no fever. She has been vaccinated Gala Murdoch 08/24/20, 10/31/211)  Lung exam is reassuring.  Explained that post covid cough can last for several weeks.   -Provided reassurance and will prescribe Benzonatate for cough.        Relevant Medications   pantoprazole (PROTONIX) 40 MG tablet   Encounter to establish care   Morbid obesity (HCC)    Patient with BMI 47 and oversized breast, reports back pain and asks for referral for breast reduction surgery.  -Will refer to plastic surgery for further evaluation      Relevant Orders   Ambulatory referral to Plastic Surgery   Screening mammogram, encounter for    Patient is new in clinic. No prior screening MM.   -Screening bilateral MM       Relevant Orders   MM Digital Screening      Outpatient Encounter Medications as of 01/22/2021  Medication Sig  . benzonatate (TESSALON PERLES) 100 MG capsule Take 1 capsule (100 mg total) by mouth 3 (three) times daily as needed for cough.  . pantoprazole (PROTONIX) 40 MG tablet Take 1 tablet (40 mg total) by mouth daily.  Marland Kitchen acetaminophen (TYLENOL) 325 MG tablet Take 650 mg by mouth daily as needed for mild pain.  . ARIPiprazole (ABILIFY) 5 MG tablet Take 5 mg by mouth daily.  . baclofen (LIORESAL) 10 MG tablet TAKE  2 TABLETS(20 MG) BY MOUTH THREE TIMES DAILY  . cholecalciferol (VITAMIN D) 1000 UNITS tablet Take 1,000 Units by mouth daily. Take one a day for 6-12 months and we will recheck your levels when you come back in the office in 6 months  .  diphenhydrAMINE (BENADRYL) 25 mg capsule Take 25 mg by mouth every 6 (six) hours as needed. allergies  . escitalopram (LEXAPRO) 20 MG tablet Take 20 mg by mouth.  . gabapentin (NEURONTIN) 300 MG capsule TAKE 2 CAPSULES(600 MG) BY MOUTH THREE TIMES DAILY  . LORazepam (ATIVAN) 1 MG tablet Take 1 mg by mouth daily.  . Multiple Vitamins-Minerals (MULTIVITAMIN ADULT PO) Take 1 tablet by mouth daily.  Marland Kitchen ocrelizumab (OCREVUS) 300 MG/10ML injection Infuse 600 mg IV Every 6 months  . tiZANidine (ZANAFLEX) 4 MG tablet TAKE 1 TABLET(4 MG) BY MOUTH THREE TIMES DAILY   No facility-administered encounter medications on file as of 01/22/2021.    Follow-up: No follow-ups on file.   Chevis Pretty, MD

## 2021-01-22 NOTE — Assessment & Plan Note (Signed)
Patient reports reflux mostly at night and also during the day after meals. She almost threw up twice at night due to reflux. No other red flag. Has good appetite. No weight loss. No abdomina pain. No N/V during the day. No early satiety. No FHx of cancer. Will try PPI and reevaluate in 2-4 weeks  -Protonix 40 mg QD -F/u in clinic in 2-4 weeks

## 2021-01-22 NOTE — Patient Instructions (Addendum)
Thank you for allowing [name] to provide your care today. Today, you came into establish care. We also discussed: Resistant cough after your Covid infection, your reflux symptoms, referral for breast reduction.  1-for reflux, I prescribed pantoprazole for you.  Please take 1 tablet every morning.  Also try to stay upright at least 30 minutes after dinner  2-for your cough, leg exam is reassuring.  You may have cough for few weeks after Covid.  I prescribed benzonatate (cough medicine) to be taken after the 3 times a day as needed.  3-I refer you to plastic surgeon to discuss options for breast reduction. 4-we will request your medical record from prior provider to establish care here. 5-please come back to clinic for first PCP visit and to follow-up of reflux, in 1-2 months. 6-I order screening mammogram for you 7-take rest of your medications as before  Should you have any questions or concerns please call the internal medicine clinic at [phone].    Thank you!

## 2021-01-22 NOTE — Assessment & Plan Note (Signed)
Stable. NO SI or HI. Continue medications as before and f/u with BH.

## 2021-01-22 NOTE — Assessment & Plan Note (Signed)
Patient reports recent COVID-19 infection with positive test 12/22/20. She had mild-moderate symptoms. It was managed at home. Her repeat COVID test 01/15/21 was negative. She is doing well except she still has dry cough. No SOB and no fever. She has been vaccinated Ebony Warren 08/24/20, 10/31/211)  Lung exam is reassuring.  Explained that post covid cough can last for several weeks.   -Provided reassurance and will prescribe Benzonatate for cough.

## 2021-01-22 NOTE — Assessment & Plan Note (Signed)
Patient with BMI 47 and oversized breast, reports back pain and asks for referral for breast reduction surgery.  -Will refer to plastic surgery for further evaluation

## 2021-01-29 NOTE — Progress Notes (Signed)
Internal Medicine Clinic Attending  Case discussed with Dr. Masoudi  At the time of the visit.  We reviewed the resident's history and exam and pertinent patient test results.  I agree with the assessment, diagnosis, and plan of care documented in the resident's note.  

## 2021-02-04 ENCOUNTER — Other Ambulatory Visit: Payer: Self-pay | Admitting: *Deleted

## 2021-02-04 DIAGNOSIS — R29898 Other symptoms and signs involving the musculoskeletal system: Secondary | ICD-10-CM

## 2021-02-04 DIAGNOSIS — G35 Multiple sclerosis: Secondary | ICD-10-CM

## 2021-02-04 DIAGNOSIS — M62838 Other muscle spasm: Secondary | ICD-10-CM

## 2021-02-11 NOTE — Telephone Encounter (Addendum)
Received wheelchair order from [facility] re: Wheelchair evaluation by PT or PT, placed on MD desk for signature. 1:00 PM order signed, faxed to [facility].

## 2021-02-20 ENCOUNTER — Other Ambulatory Visit: Payer: Self-pay

## 2021-02-20 ENCOUNTER — Encounter: Payer: Self-pay | Admitting: Internal Medicine

## 2021-02-20 ENCOUNTER — Ambulatory Visit (INDEPENDENT_AMBULATORY_CARE_PROVIDER_SITE_OTHER): Payer: Medicare Other | Admitting: Internal Medicine

## 2021-02-20 DIAGNOSIS — G35 Multiple sclerosis: Secondary | ICD-10-CM | POA: Diagnosis not present

## 2021-02-20 DIAGNOSIS — K219 Gastro-esophageal reflux disease without esophagitis: Secondary | ICD-10-CM

## 2021-02-20 MED ORDER — BENZONATATE 100 MG PO CAPS: 100.0000 mg | ORAL_CAPSULE | Freq: Three times a day (TID) | ORAL | 1 refills | Status: DC | PRN

## 2021-02-20 MED ORDER — SEMAGLUTIDE(0.25 OR 0.5MG/DOS) 2 MG/1.5ML ~~LOC~~ SOPN: 0.2500 mg | PEN_INJECTOR | SUBCUTANEOUS | 0 refills | Status: DC

## 2021-02-20 NOTE — Assessment & Plan Note (Signed)
Patient reports significant symptom improvement since starting Protonix 40 mg daily. Will continue current therapy.

## 2021-02-20 NOTE — Assessment & Plan Note (Signed)
In addition to breast reduction surgery, patient is also interested in medical management for weight loss. Her MS makes it challenging for her to lose weight via diet and exercise alone.  Discussed the option of starting a GLP-1 which she is interested in. She requested further information for her to read which I gave her today, but she also wanted to go ahead and start the medication.   -start semaglutide 0.25 mg weekly -follow-up in 4 weeks to see how she is tolerating and increase to 0.5 mg dosing if things are going well

## 2021-02-20 NOTE — Progress Notes (Addendum)
Acute Office Visit  Subjective:    Patient ID: Ebony Warren, female    DOB: 08/31/1976, 45 y.o.   MRN: [id number]  Chief Complaint  Patient presents with  . Follow-up    CHRONIC LEFT LEG PAIN WEIGHT LOSS MEDICATION REFILL BILATERAL SWELLING OF FEET    HPI Patient is in today for acute on chronic left leg pain and to discuss weight loss. Please see problem based charting for details on today's visit.   Past Medical History:  Diagnosis Date  . Anxiety and depression   . Bradycardia   . Multiple sclerosis (HCC)   . Seizures (HCC)    told that they are not from the brain but stress related    Past Surgical History:  Procedure Laterality Date  . CHOLECYSTECTOMY    . KNEE SURGERY      Family History  Problem Relation Age of Onset  . Hypertension Mother   . Atrial fibrillation Mother   . Hypertension Father   . Atrial fibrillation Maternal Grandmother     Social History   Socioeconomic History  . Marital status: Single    Spouse name: Not on file  . Number of children: 1  . Years of education: College  . Highest education level: Not on file  Occupational History    Employer: OTHER    Comment: disabled  Tobacco Use  . Smoking status: Former Smoker    Packs/day: 0.35    Years: 3.00    Pack years: 1.05    Types: Cigarettes    Quit date: 12/07/2006    Years since quitting: 14.2  . Smokeless tobacco: Never Used  Substance and Sexual Activity  . Alcohol use: No    Alcohol/week: 0.0 standard drinks    Comment: quit: 2013 (socially)  . Drug use: Yes    Types: Marijuana    Comment: quit: 2001   . Sexual activity: Not on file  Other Topics Concern  . Not on file  Social History Narrative   Patient lives at home with family.   Caffeine Use: drinks a cup of hot tea with tecfidera in the am   Patient is right handed.   Patient has a college education   Social Determinants of Corporate investment banker Strain: Not on file  Food Insecurity: Not  on file  Transportation Needs: Not on file  Physical Activity: Not on file  Stress: Not on file  Social Connections: Not on file  Intimate Partner Violence: Not on file    Outpatient Medications Prior to Visit  Medication Sig Dispense Refill  . acetaminophen (TYLENOL) 325 MG tablet Take 650 mg by mouth daily as needed for mild pain.    . ARIPiprazole (ABILIFY) 5 MG tablet Take 5 mg by mouth daily.    . baclofen (LIORESAL) 10 MG tablet TAKE 2 TABLETS(20 MG) BY MOUTH THREE TIMES DAILY 180 tablet 11  . cholecalciferol (VITAMIN D) 1000 UNITS tablet Take 1,000 Units by mouth daily. Take one a day for 6-12 months and we will recheck your levels when you come back in the office in 6 months    . diphenhydrAMINE (BENADRYL) 25 mg capsule Take 25 mg by mouth every 6 (six) hours as needed. allergies    . escitalopram (LEXAPRO) 20 MG tablet Take 20 mg by mouth.    . gabapentin (NEURONTIN) 300 MG capsule TAKE 2 CAPSULES(600 MG) BY MOUTH THREE TIMES DAILY 180 capsule 12  . LORazepam (ATIVAN) 1 MG tablet Take 1 mg by  mouth daily.    . Multiple Vitamins-Minerals (MULTIVITAMIN ADULT PO) Take 1 tablet by mouth daily.    Marland Kitchen ocrelizumab (OCREVUS) 300 MG/10ML injection Infuse 600 mg IV Every 6 months 20 mL 1  . pantoprazole (PROTONIX) 40 MG tablet Take 1 tablet (40 mg total) by mouth daily. 30 tablet 3  . tiZANidine (ZANAFLEX) 4 MG tablet TAKE 1 TABLET(4 MG) BY MOUTH THREE TIMES DAILY 90 tablet 12  . benzonatate (TESSALON PERLES) 100 MG capsule Take 1 capsule (100 mg total) by mouth 3 (three) times daily as needed for cough. 30 capsule 1   No facility-administered medications prior to visit.    Allergies  Allergen Reactions  . Morphine Itching  . Orange Fruit [Citrus]     Acidic foods Makes her itchy and her face "feels like its on fire", have rash.  . Oxycodone Itching    rash  . Percocet [Oxycodone-Acetaminophen]     Stomach cramps   . Vicodin [Hydrocodone-Acetaminophen]     Stomach cramps   .  Other Rash    Narcotics. Spicy foods (cause seizures)    Review of Systems  Constitutional: Negative for fever and unexpected weight change.  Cardiovascular: Negative for leg swelling.  Musculoskeletal: Positive for gait problem.       Left leg pain   Skin: Negative for color change and rash.  Neurological: Negative for dizziness and headaches.       Objective:    Physical Exam Constitutional:      General: She is not in acute distress.    Appearance: Normal appearance.  Eyes:     Conjunctiva/sclera: Conjunctivae normal.  Pulmonary:     Effort: Pulmonary effort is normal.  Abdominal:     General: There is no distension.  Musculoskeletal:     Right lower leg: No edema.     Left lower leg: No edema.     Comments: Lateral aspect of left leg with tenderness to palpation, even to light touch. No swelling or skin changes noted.   Skin:    General: Skin is warm and dry.     Findings: No lesion or rash.  Neurological:     Mental Status: She is alert.  Psychiatric:        Mood and Affect: Mood normal.        Behavior: Behavior normal.     BP 124/79 (BP Location: Left Arm, Patient Position: Sitting, Cuff Size: Normal)   Pulse 75   Temp 98.3 F (36.8 C) (Oral)   Ht 5\' 8"  (1.727 m)   Wt (!) 305 lb (138.3 kg)   SpO2 100%   BMI 46.38 kg/m  Wt Readings from Last 3 Encounters:  02/20/21 (!) 305 lb (138.3 kg)  01/22/21 (!) 309 lb 4.8 oz (140.3 kg)  12/10/20 (!) 308 lb 3.2 oz (139.8 kg)    Health Maintenance Due  Topic Date Due  . Hepatitis C Screening  Never done  . COVID-19 Vaccine (1) Never done  . TETANUS/TDAP  Never done  . PAP SMEAR-Modifier  Never done  . INFLUENZA VACCINE  07/07/2020    There are no preventive care reminders to display for this patient.   No results found for: TSH Lab Results  Component Value Date   WBC 5.9 12/10/2020   HGB 14.2 12/10/2020   HCT 42.9 12/10/2020   MCV 94 12/10/2020   PLT 280 12/10/2020   Lab Results  Component Value  Date   NA 139 12/10/2020   K 4.3 12/10/2020  CO2 23 12/10/2020   GLUCOSE 86 12/10/2020   BUN 11 12/10/2020   CREATININE 0.93 12/10/2020   BILITOT 0.4 12/10/2020   ALKPHOS 88 12/10/2020   AST 12 12/10/2020   ALT 12 12/10/2020   PROT 6.7 12/10/2020   ALBUMIN 3.9 12/10/2020   CALCIUM 9.4 12/10/2020   ANIONGAP 9 08/30/2014   No results found for: CHOL No results found for: HDL No results found for: LDLCALC No results found for: TRIG No results found for: CHOLHDL No results found for: WLSL3T     Assessment & Plan:   Problem List Items Addressed This Visit      Digestive   Gastroesophageal reflux disease    Patient reports significant symptom improvement since starting Protonix 40 mg daily. Will continue current therapy.       Relevant Medications   benzonatate (TESSALON PERLES) 100 MG capsule     Nervous and Auditory   Multiple sclerosis (HCC)    Patient presents with acute on chronic left leg pain which she states intermittently occurs and is related to her MS.  She is on Gabapentin 600 mg TID, Baclofen 20 mg TID, and Tizanidine 4 mg TID.  She describes the pain as pins and needles and exhibits allodynia on exam. Will increase her Gabapentin to 900 mg TID since it seems to be more neuropathic in nature. I advised her to make her neurologist aware so he can make any additional recommendations or medication adjustments if needed.         Other   Morbid obesity (HCC)    In addition to breast reduction surgery, patient is also interested in medical management for weight loss. Her MS makes it challenging for her to lose weight via diet and exercise alone.  Discussed the option of starting a GLP-1 which she is interested in. She requested further information for her to read which I gave her today, but she also wanted to go ahead and start the medication.   -start semaglutide 0.25 mg weekly -follow-up in 4 weeks to see how she is tolerating and increase to 0.5 mg dosing if  things are going well       Relevant Medications   Semaglutide,0.25 or 0.5MG /DOS, 2 MG/1.5ML SOPN       Meds ordered this encounter  Medications  . benzonatate (TESSALON PERLES) 100 MG capsule    Sig: Take 1 capsule (100 mg total) by mouth 3 (three) times daily as needed for cough.    Dispense:  30 capsule    Refill:  1  . Semaglutide,0.25 or 0.5MG /DOS, 2 MG/1.5ML SOPN    Sig: Inject 0.25 mg into the skin once a week.    Dispense:  1.5 mL    Refill:  0     Erskine Steinfeldt D Azam Gervasi, DO

## 2021-02-20 NOTE — Patient Instructions (Signed)
Ebony Warren, It was nice meeting you!  Today we discussed:   Acid reflux: I'm so glad the protonix is working well. You should have 3 more refills on this medication, but let us know if there is an issue.   Leg pain: we will increase the gabapentin to 3 pills three times daily. I would also make your neurologist aware so he can make additional adjustments if this doesn't seem to be beneficial.   Weight loss: We are going to start an injectable medication called semaglutide. It is a once weekly medication. I have provided some information on this for you to read. We'll bring you back in 4 weeks to see how things are doing and make the next dose increase if you're doing well!  Take care!

## 2021-02-20 NOTE — Assessment & Plan Note (Signed)
Patient presents with acute on chronic left leg pain which she states intermittently occurs and is related to her MS.  She is on Gabapentin 600 mg TID, Baclofen 20 mg TID, and Tizanidine 4 mg TID.  She describes the pain as pins and needles and exhibits allodynia on exam. Will increase her Gabapentin to 900 mg TID since it seems to be more neuropathic in nature. I advised her to make her neurologist aware so he can make any additional recommendations or medication adjustments if needed.

## 2021-02-21 NOTE — Progress Notes (Signed)
Internal Medicine Clinic Attending  Case discussed with Dr. Bloomfield at the time of the visit.  We reviewed the resident's history and exam and pertinent patient test results.  I agree with the assessment, diagnosis, and plan of care documented in the resident's note.  Alexander Raines, M.D., Ph.D.  

## 2021-02-27 ENCOUNTER — Other Ambulatory Visit: Payer: Self-pay

## 2021-02-27 ENCOUNTER — Ambulatory Visit (INDEPENDENT_AMBULATORY_CARE_PROVIDER_SITE_OTHER): Payer: Medicare Other | Admitting: Plastic Surgery

## 2021-02-27 ENCOUNTER — Encounter: Payer: Self-pay | Admitting: Plastic Surgery

## 2021-02-27 VITALS — BP 126/84 | HR 79 | Ht 68.0 in | Wt 298.8 lb

## 2021-02-27 DIAGNOSIS — N62 Hypertrophy of breast: Secondary | ICD-10-CM | POA: Diagnosis not present

## 2021-02-27 DIAGNOSIS — M545 Low back pain, unspecified: Secondary | ICD-10-CM

## 2021-02-27 DIAGNOSIS — M4004 Postural kyphosis, thoracic region: Secondary | ICD-10-CM | POA: Diagnosis not present

## 2021-02-27 DIAGNOSIS — M546 Pain in thoracic spine: Secondary | ICD-10-CM

## 2021-02-27 NOTE — Progress Notes (Signed)
Referring Provider Rehman, Areeg N, DO [address] [place],  [place] [address]   CC:  Chief Complaint  Patient presents with  . consult      Ebony Warren is an 45 y.o. female.  HPI: Patient presents to discuss breast reduction.  She had years of back pain, neck pain and shoulder pain related to her large breasts.  She gets rashes beneath her breast that been refractory to over-the-counter treatments.  She has no family history of breast cancer and no previous breast biopsies or procedures.  She does not smoke and is not a diabetic.  Her current cup size is very large and she would be interested in a C cup if possible.  She does have multiple sclerosis and has limited strength in her legs and limited ability to ambulate.  She can stand with assistance but is mostly in a wheelchair.  Allergies  Allergen Reactions  . Morphine Itching  . Orange Fruit [Citrus]     Acidic foods Makes her itchy and her face "feels like its on fire", have rash.  . Oxycodone Itching    rash  . Percocet [Oxycodone-Acetaminophen]     Stomach cramps   . Vicodin [Hydrocodone-Acetaminophen]     Stomach cramps   . Other Rash    Narcotics. Spicy foods (cause seizures)    Outpatient Encounter Medications as of 02/27/2021  Medication Sig Note  . ARIPiprazole (ABILIFY) 5 MG tablet Take 5 mg by mouth daily.   . baclofen (LIORESAL) 10 MG tablet TAKE 2 TABLETS(20 MG) BY MOUTH THREE TIMES DAILY   . benzonatate (TESSALON PERLES) 100 MG capsule Take 1 capsule (100 mg total) by mouth 3 (three) times daily as needed for cough.   . cholecalciferol (VITAMIN D) 1000 UNITS tablet Take 1,000 Units by mouth daily. Take one a day for 6-12 months and we will recheck your levels when you come back in the office in 6 months   . diphenhydrAMINE (BENADRYL) 25 mg capsule Take 25 mg by mouth every 6 (six) hours as needed. allergies   . escitalopram (LEXAPRO) 20 MG tablet Take 20 mg by mouth. 05/13/2016: Received from:  [facility]  . gabapentin (NEURONTIN) 300 MG capsule TAKE 2 CAPSULES(600 MG) BY MOUTH THREE TIMES DAILY (Patient taking differently: 300 mg. TAKE 3 CAPSULES BY MOUTH THREE TIMES DAILY.)   . LORazepam (ATIVAN) 1 MG tablet Take 1 mg by mouth daily.   . Multiple Vitamins-Minerals (MULTIVITAMIN ADULT PO) Take 1 tablet by mouth daily.   Marland Kitchen ocrelizumab (OCREVUS) 300 MG/10ML injection Infuse 600 mg IV Every 6 months 06/12/2020: 06/11/20 approved 03/13/20 - 06/11/2021  . pantoprazole (PROTONIX) 40 MG tablet Take 1 tablet (40 mg total) by mouth daily.   . Semaglutide,0.25 or 0.5MG /DOS, 2 MG/1.5ML SOPN Inject 0.25 mg into the skin once a week.   Marland Kitchen tiZANidine (ZANAFLEX) 4 MG tablet TAKE 1 TABLET(4 MG) BY MOUTH THREE TIMES DAILY   . acetaminophen (TYLENOL) 325 MG tablet Take 650 mg by mouth daily as needed for mild pain.    No facility-administered encounter medications on file as of 02/27/2021.     Past Medical History:  Diagnosis Date  . Anxiety and depression   . Bradycardia   . Multiple sclerosis (HCC)   . Seizures (HCC)    told that they are not from the brain but stress related    Past Surgical History:  Procedure Laterality Date  . CHOLECYSTECTOMY    . KNEE SURGERY  Family History  Problem Relation Age of Onset  . Hypertension Mother   . Atrial fibrillation Mother   . Hypertension Father   . Atrial fibrillation Maternal Grandmother     Social History   Social History Narrative   Patient lives at home with family.   Caffeine Use: drinks a cup of hot tea with tecfidera in the am   Patient is right handed.   Patient has a college education     Review of Systems General: Denies fevers, chills, weight loss CV: Denies chest pain, shortness of breath, palpitations  Physical Exam Vitals with BMI 02/27/2021 02/20/2021 01/22/2021  Height 5\' 8"  5\' 8"  5\' 8"   Weight 298 lbs 13 oz 305 lbs 309 lbs 5 oz  BMI 45.44 46.39 47.04  Systolic 126 124  Diastolic 84 79 84  Pulse 79 75 68     General:  No acute distress,  Alert and oriented, Non-Toxic, Normal speech and affect Breast: She has grade 3 ptosis.  Sternal notch to nipple is 50 cm on the right and 47 cm on the left.  Nipple to fold is 25 cm on the right and 27 cm on the left.  Assessment/Plan The patient has bilateral symptomatic macromastia.  She is a good candidate for a breast reduction.  She is interested in pursuing surgical treatment.  She has tried supportive garments and fitted bras with no relief.  The details of breast reduction surgery were discussed.  I explained the procedure in detail along the with the expected scars.  The risks were discussed in detail and include bleeding, infection, damage to surrounding structures, need for additional procedures, nipple loss, change in nipple sensation, persistent pain, contour irregularities and asymmetries.  I explained that breast feeding is often not possible after breast reduction surgery.  We discussed the expected postoperative course with an overall recovery period of about 1 month.  She demonstrated full understanding of all risks.  We discussed her personal risk factors that include the length of her breast.  I explained due to the length of the breast in my opinion the only acceptable technique would be free nipple graft.  I explained the downsides of that that include irregular pigmentation of the grafted nipple areolar complex and insensate nipples.  She is fine with this approach.  We also discussed the fact that her multiple sclerosis will make her recovery more difficult.  She does have quite a bit of support with nurse and good family support as well and does not expect this to be an issue for her.  We also discussed the fact that her BMI which is in the mid 40s places her in a high risk category for wound healing complications.  Although she is a slightly higher risk I think her ability to lose weight is somewhat impeded by her multiple sclerosis in addition to fact  that her breasts are so heavy.  I think the benefits outweigh the risks in this particular case and the patient is understanding of all the above.  I anticipate approximately 1500g of tissue removed from each side.   02/27/2021, 3:10 PM

## 2021-03-03 ENCOUNTER — Other Ambulatory Visit: Payer: Self-pay | Admitting: Student in an Organized Health Care Education/Training Program

## 2021-03-03 DIAGNOSIS — Z1231 Encounter for screening mammogram for malignant neoplasm of breast: Secondary | ICD-10-CM

## 2021-03-20 ENCOUNTER — Encounter: Payer: Self-pay | Admitting: Internal Medicine

## 2021-03-20 ENCOUNTER — Ambulatory Visit (INDEPENDENT_AMBULATORY_CARE_PROVIDER_SITE_OTHER): Payer: Medicare Other | Admitting: Internal Medicine

## 2021-03-20 ENCOUNTER — Other Ambulatory Visit: Payer: Self-pay

## 2021-03-20 DIAGNOSIS — F32A Depression, unspecified: Secondary | ICD-10-CM | POA: Diagnosis not present

## 2021-03-20 DIAGNOSIS — Z6841 Body Mass Index (BMI) 40.0 and over, adult: Secondary | ICD-10-CM

## 2021-03-20 DIAGNOSIS — G35 Multiple sclerosis: Secondary | ICD-10-CM | POA: Diagnosis present

## 2021-03-20 MED ORDER — SEMAGLUTIDE(0.25 OR 0.5MG/DOS) 2 MG/1.5ML ~~LOC~~ SOPN: 0.5000 mg | PEN_INJECTOR | SUBCUTANEOUS | 0 refills | Status: DC

## 2021-03-20 MED ORDER — DULOXETINE HCL 30 MG PO CPEP: 30.0000 mg | ORAL_CAPSULE | Freq: Every day | ORAL | 2 refills | Status: DC

## 2021-03-20 NOTE — Assessment & Plan Note (Signed)
Patient states that she has been stable on the citalopram 20 mg daily for many years.  She was diagnosed with depression since before her MS diagnosis.  Discussed Cymbalta will be a good alternative to help also manage her neuropathic pain which is not currently well controlled on her current medications of gabapentin, baclofen and tizanidine.  Discussed with pharmacy about titrating down Lexapro prior to starting Cymbalta ; no good data to suggest titration is necessary.  Patient is agreeable to try switching from Lexapro to Cymbalta.  Prescribed Cymbalta 30 mg daily for 1 week and then to increase to 60 mg daily.  Patient educated on risks and signs/symptoms of serotonin syndrome.  Plan: Switch from Lexapro to Cymbalta 30 mg daily, uptitrate after 1 week if tolerated to 60 mg daily Follow-up in 4 weeks

## 2021-03-20 NOTE — Patient Instructions (Addendum)
Thank you for allowing [name] to provide your care today. Today we discussed weight loss and your left leg pain.   Today we made changes to your medications.  Please increase your semaglutide dose to 0.5 mg weekly.  Have also sent in a prescription for Cymbalta 30 mg daily.  Take 30 mg daily for 1 week and then increase to 60 mg daily if tolerated  Please follow-up in 4 weeks.   Should you have any questions or concerns please call the internal medicine clinic at [phone].

## 2021-03-20 NOTE — Progress Notes (Signed)
   CC: weight loss, right leg pain  HPI:  Ms.Chirstina Warren is a 45 y.o. with a past medical history provided below presenting for follow-up regarding her weight loss. For details of today's visit and the status of his chronic medical issues please refer to the assessment and plan.   Past Medical History:  Diagnosis Date  . Anxiety and depression   . Bradycardia   . Encounter to establish care 01/22/2021  . Multiple sclerosis (HCC)   . Screening mammogram, encounter for 01/22/2021  . Seizures (HCC)    told that they are not from the brain but stress related   Review of Systems:   Review of Systems  Cardiovascular: Positive for leg swelling.  Gastrointestinal: Negative for abdominal pain, nausea and vomiting.  Musculoskeletal: Positive for joint pain and myalgias.  Neurological: Positive for tingling and weakness.     Physical Exam:  Vitals:   03/20/21 1107  BP: 112/75  Pulse: 70  Temp: 98.2 F (36.8 C)  TempSrc: Oral  SpO2: 97%  Weight: 295 lb 14.4 oz (134.2 kg)  Height: 5\' 8"  (1.727 m)    Physical Exam Constitutional:      General: She is not in acute distress.    Appearance: Normal appearance. She is obese. She is not ill-appearing.  Cardiovascular:     Rate and Rhythm: Normal rate and regular rhythm.     Pulses: Normal pulses.     Heart sounds: Normal heart sounds. No murmur heard. No friction rub. No gallop.   Pulmonary:     Effort: Pulmonary effort is normal. No respiratory distress.     Breath sounds: Normal breath sounds. No wheezing or rales.  Abdominal:     General: Abdomen is flat. Bowel sounds are normal. There is no distension.     Palpations: Abdomen is soft.  Musculoskeletal:        General: Tenderness present. No swelling.     Right lower leg: Edema present.     Left lower leg: Edema present.  Skin:    General: Skin is warm and dry.  Neurological:     Mental Status: She is alert and oriented to person, place, and time.      Sensory: Sensory deficit present.     Motor: Weakness present.     Gait: Gait abnormal.  Psychiatric:        Mood and Affect: Mood normal.        Behavior: Behavior normal.        Thought Content: Thought content normal.        Judgment: Judgment normal.     Assessment & Plan:   See Encounters Tab for problem based charting.  Patient discussed with Dr. 

## 2021-03-20 NOTE — Assessment & Plan Note (Addendum)
Patient reports continued acute on chronic left leg pain.  She states it is not well controlled on her current medications.  She states it is intermittent and related to her MS.  She describes a sharp pain starting on her left calf radiating up to her mid thigh.  She endorses numbness and tingling.  Exhibits allodynia on exam.  Her gabapentin was recently increased to 900 mg 3 times daily due to her pain being neuropathic in nature.  She states that the pain has not really improved on this regimen.  She is also taking baclofen 10 mg 3 times daily and tizanidine 4 mg 3 times daily.    Discussed that we could switch her antidepressant from Lexapro to Cymbalta; Cymbalta has shown to improve neuropathic pain.  Recommended that she follow-up with her neurologist regarding additional recommendations  Plan: Continue current medications, gabapentin and baclofen and tizanidine Switch Lexapro to Cymbalta Follow-up with neurologist

## 2021-03-20 NOTE — Assessment & Plan Note (Signed)
She is tolerating the semaglutide 0.25 mg weekly.  Does not endorse any side effects.  We will increase this to 2.5 mg weekly.  He is also planning to have breast reduction surgery this June.

## 2021-03-24 ENCOUNTER — Telehealth: Payer: Self-pay | Admitting: *Deleted

## 2021-03-24 NOTE — Progress Notes (Signed)
Internal Medicine Clinic Attending ° °Case discussed with Dr. Rehman  At the time of the visit.  We reviewed the resident’s history and exam and pertinent patient test results.  I agree with the assessment, diagnosis, and plan of care documented in the resident’s note.  ° °

## 2021-03-24 NOTE — Telephone Encounter (Signed)
Called patient who stated Ebony Warren, [employer] called her today, told her MD had to approve everything for power wheelchair in an appointment.  Called [employer] p [phone]., spoke with Lawerance Cruel. Advised patient had face to face in Jan. She will ask [facility] team to contact me by phone today.

## 2021-03-25 ENCOUNTER — Encounter: Payer: Self-pay | Admitting: Surgical

## 2021-03-25 NOTE — Progress Notes (Signed)
Surgical Clearance has been received from Dr. Lerry Liner for patient's upcoming surgery B/L Breast Reduction with Dr. Arita Miss .  Patient is medically cleared

## 2021-03-26 NOTE — Telephone Encounter (Signed)
Patient returned call, able to work her in next Monday. She verbalized understanding, appreciation. Called [employer], spoke with Stanton Kidney, DME dept and gave her patient;'s face to face appointment date. She  verbalized understanding, appreciation.

## 2021-03-26 NOTE — Telephone Encounter (Signed)
Called [employer], DME spoke with Elease Hashimoto who stated they didn't receive power wheelchair referral until March, face to face evaluation with MD must be within 30 days. Patient had face to face in Jan.  Elease Hashimoto stated when new appointment is made please call P#[phone], rehab team direct line. They will fax over necessary documents for insurance purposes. before appointment.  Called patient, LVM requesting she call back to schedule FU, will work her in.

## 2021-03-31 ENCOUNTER — Encounter: Payer: Self-pay | Admitting: Diagnostic Neuroimaging

## 2021-03-31 ENCOUNTER — Ambulatory Visit (INDEPENDENT_AMBULATORY_CARE_PROVIDER_SITE_OTHER): Payer: Medicare Other | Admitting: Diagnostic Neuroimaging

## 2021-03-31 VITALS — BP 99/67 | HR 79 | Ht 68.0 in | Wt 290.0 lb

## 2021-03-31 DIAGNOSIS — R29898 Other symptoms and signs involving the musculoskeletal system: Secondary | ICD-10-CM

## 2021-03-31 DIAGNOSIS — G35 Multiple sclerosis: Secondary | ICD-10-CM

## 2021-03-31 DIAGNOSIS — M62838 Other muscle spasm: Secondary | ICD-10-CM

## 2021-03-31 DIAGNOSIS — R5383 Other fatigue: Secondary | ICD-10-CM

## 2021-03-31 NOTE — Telephone Encounter (Signed)
Face to Face 03/31/21 Office note, power wheelchair order, mobility/seating evaluation signed faxed to [employer].

## 2021-03-31 NOTE — Progress Notes (Addendum)
[facility]  PATIENT: Ebony Warren DOB: 11-30-1976  REFERRING CLINICIAN: Williams  HISTORY FROM: patient  REASON FOR VISIT: follow up   HISTORICAL  CHIEF COMPLAINT:  Chief Complaint  Patient presents with  . Multiple Sclerosis    Rm 7 face-to face for power wheelchair  [facility] nurse- [place]    HISTORY OF PRESENT ILLNESS:   UPDATE (03/31/21, VRP): Since last visit, here for evaluation for electric wheelchair / power chair again. Symptoms are progressive. Severity is moderate. No alleviating or aggravating factors. Tolerating meds / ocrevus. Here for face-face evaluation and mobility assessment for power wheelchair. Has multiple sclerosis and hemiplegia. Cannot perform ADLs due to mobility issues. Cannot propel manual wheelchair due to hand weakness. Living at home with son and mother. Has personal care aid. Not driving currently.   UPDATE (12/10/20, VRP): Since last visit, more left leg weakness and gait difficulty. Requesting evaluation for electric wheelchair / power chair. Symptoms are progressive. Severity is moderate. No alleviating or aggravating factors. Tolerating meds / ocrevus. Here for face-face evaluation of power wheelchair. Cannot perform ADLs due to mobility issues. Cannot propel manual wheelchair due to hand weakness. Living at home with son and mother. Has personal care aid. Not driving currently.   UPDATE (05/21/20, VRP): Since last visit, doing fair. More depression, spasms. Had event last week of body trembling, no LOC, lasting 10 minutes. Symptoms are progressive. Tolerating ocrevus and muscle relaxers.  UPDATE (11/20/19, VRP): Here for face-face evaluation of wheelchair need. Currently using a wheelchair due to MS and mobility impairment, now needs a replacement. Patient able to propel wheelchair. Overall, since last visit, doing fair. Continues on ocrevus; had mild reaction at IV site last time. No other alleviating or aggravating factors.  Tolerating meds.    UPDATE (10/25/18, VRP): Since last visit, doing about the same. Symptoms are stable EXCEPT right hand weakness and coordination is worse. Some right shoulder twitching. Memory loss continues. No alleviating or aggravating factors. Tolerating meds.    UPDATE (04/18/18, VRP): Since last visit, doing well on ocrevus, except had another fall. Recently more urine incont, and right leg swelling. Tolerating meds. No alleviating or aggravating factors.   UPDATE (10/18/17, VRP): Since last visit, doing well. Tolerating ocrevus. No alleviating or aggravating factors. No new events. C/o cough and dry mouth issues.   UPDATE 05/19/17: Since last visit, now transitioned to ocrevus (May 21, June 4). Did well with infusion. Spasms stable. Left leg slightly better. Bladder slightly better. Depression stable. Using rollator walker. No other triggering factors. Overall doing well.   UPDATE 02/08/17: Since last visit, MRI brain shows 1 new lesion. Here to discuss ocrevus. Also with more left leg pain, muscle spasms, gait diff, fatigue. Right ear pain better. Bladder incont slightly better with oxybutinin initially, but not anymore.   UPDATE 12/22/16: Since last visit, was doing well until Nov 2017 and then developed stabbing right ear pain (continues to present). Also with new and increasing spasm and cramps in right hand and left leg since Dec 2017. Continues with fatigue and bladder incontinence.  UPDATE 05/13/16: Since last visit, doing well. No new MS events or symptoms. In retrospect, may have lost vision in right eye (2015, and possibly March 2016), lasting 3-7 days. Didn't mention to me until now. Recently this was noted optometry and ophthalmology exams.   UPDATE 01/27/16: Since last visit, fatigue improved with adjusting medication timing. Bladder issues stable. Had UTI at last visit, tx'd with abx, now better.  UPDATE 10/22/15:  Since last visit was doing well. Then last week had new onset  weakness in legs, bladder incontinence, strong smelling urine, and hand tremors. Now sxs almost fully resolved since yesterday.  UPDATE 05/01/15: Symptoms stable. Tolerating tecfidera.  UPDATE 01/25/15: Since last visit, had more problems for a few weeks (lower ext weakness, bladder incont), but now improved. Now on tecfidera x 3 weeks. Some itching and stomach issues with tecfidera, but mild. Main issues now consist of lower extremity pain, spasms, urinary leakage, daytime fatigue.  UPDATE 10/31/14: Since last visit, sxs are stable. Test results and diagnosis reviewed. No new events. Using cane to walk.  UPDATE (10/02/14): 45 year old right-handed female here for evaluation of double vision. 2008 patient was 6 months pregnant with her son, when all of a sudden she didn't feel good. She noticed right hand clumsiness and incoordination. This lasted for approximately 9 months and then stopped. She did not seek medical attention for this problem. 2013 patient had onset of right arm weakness, right leg weakness, numbness on the right side, intermittent shaking sensation all over. Patient was evaluated in [place] and diagnosed with possible stroke. 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 Patient did not recover right arm or leg strength. Her speech was also affected. October 2014 patient moved to [facility]. 08/30/2014 patient was at home hanging clothes over the bathtub when she fell down. She is not sure what caused her fall. Her right leg may have given out. Patient fell and struck her head against the soap dish which broke off. Patient did not lose consciousness. Patient's family advised her to go to the emergency room. Patient was evaluated with CT and MRI of the brain. No acute findings were found. MRI of the brain did show multiple white matter lesions suspicious for chronic multiple sclerosis versus chronic small vessel ischemic disease. Advised to follow-up in outpatient basis. Since this fall  patient has noticed some additional blurred vision, and double vision especially when looking to the left side.   REVIEW OF SYSTEMS: Full 14 system review of systems performed and negative except for: as per HPI.    ALLERGIES: Allergies  Allergen Reactions  . Vicodin Hp [Hydrocodone-Acetaminophen] Itching  . Morphine Itching  . Orange Fruit [Citrus]     Acidic foods Makes her itchy and her face "feels like its on fire", have rash.  . Oxycodone Itching    rash  . Percocet [Oxycodone-Acetaminophen]     Stomach cramps   . Vicodin [Hydrocodone-Acetaminophen]     Stomach cramps   . Other Rash    Narcotics. Spicy foods (cause seizures)    HOME MEDICATIONS: Outpatient Medications Prior to Visit  Medication Sig Dispense Refill  . acetaminophen (TYLENOL) 325 MG tablet Take 650 mg by mouth daily as needed for mild pain.    . ARIPiprazole (ABILIFY) 5 MG tablet Take 5 mg by mouth daily.    . baclofen (LIORESAL) 10 MG tablet TAKE 2 TABLETS(20 MG) BY MOUTH THREE TIMES DAILY 180 tablet 11  . benzonatate (TESSALON PERLES) 100 MG capsule Take 1 capsule (100 mg total) by mouth 3 (three) times daily as needed for cough. 30 capsule 1  . cholecalciferol (VITAMIN D) 1000 UNITS tablet Take 1,000 Units by mouth daily. Take one a day for 6-12 months and we will recheck your levels when you come back in the office in 6 months    . diphenhydrAMINE (BENADRYL) 25 mg capsule Take 25 mg by mouth every 6 (six) hours as needed. allergies    .  DULoxetine (CYMBALTA) 30 MG capsule Take 1 capsule (30 mg total) by mouth daily. 30 capsule 2  . gabapentin (NEURONTIN) 300 MG capsule TAKE 2 CAPSULES(600 MG) BY MOUTH THREE TIMES DAILY (Patient taking differently: 300 mg. TAKE 3 CAPSULES BY MOUTH THREE TIMES DAILY.) 180 capsule 12  . LORazepam (ATIVAN) 1 MG tablet Take 1 mg by mouth daily.    . Multiple Vitamins-Minerals (MULTIVITAMIN ADULT PO) Take 1 tablet by mouth daily.    Marland Kitchen. ocrelizumab (OCREVUS) 300 MG/10ML  injection Infuse 600 mg IV Every 6 months 20 mL 1  . pantoprazole (PROTONIX) 40 MG tablet Take 1 tablet (40 mg total) by mouth daily. 30 tablet 3  . Semaglutide,0.25 or 0.5MG /DOS, 2 MG/1.5ML SOPN Inject 0.5 mg into the skin once a week. 1.5 mL 0  . tiZANidine (ZANAFLEX) 4 MG tablet TAKE 1 TABLET(4 MG) BY MOUTH THREE TIMES DAILY 90 tablet 12   No facility-administered medications prior to visit.    PAST MEDICAL HISTORY: Past Medical History:  Diagnosis Date  . Anxiety and depression   . Bradycardia   . Encounter to establish care 01/22/2021  . Multiple sclerosis (HCC)   . Screening mammogram, encounter for 01/22/2021  . Seizures (HCC)    told that they are not from the brain but stress related    PAST SURGICAL HISTORY: Past Surgical History:  Procedure Laterality Date  . CHOLECYSTECTOMY    . KNEE SURGERY      FAMILY HISTORY: Family History  Problem Relation Age of Onset  . Hypertension Mother   . Atrial fibrillation Mother   . Hypertension Father   . Atrial fibrillation Maternal Grandmother     SOCIAL HISTORY:  Social History   Socioeconomic History  . Marital status: Single    Spouse name: Not on file  . Number of children: 1  . Years of education: College  . Highest education level: Not on file  Occupational History    Employer: OTHER    Comment: disabled  Tobacco Use  . Smoking status: Former Smoker    Packs/day: 0.35    Years: 3.00    Pack years: 1.05    Types: Cigarettes    Quit date: 12/07/2006    Years since quitting: 14.3  . Smokeless tobacco: Never Used  Substance and Sexual Activity  . Alcohol use: No    Alcohol/week: 0.0 standard drinks    Comment: quit: 2013 (socially)  . Drug use: Yes    Types: Marijuana    Comment: quit: 2001   . Sexual activity: Not on file  Other Topics Concern  . Not on file  Social History Narrative   Patient lives at home with family.   Caffeine Use: drinks a cup of hot tea with tecfidera in the am   Patient is  right handed.   Patient has a college education   Social Determinants of Corporate investment bankerHealth   Financial Resource Strain: Not on file  Food Insecurity: Not on file  Transportation Needs: Not on file  Physical Activity: Not on file  Stress: Not on file  Social Connections: Not on file  Intimate Partner Violence: Not on file     PHYSICAL EXAM  Vitals:   03/31/21 1239  BP: 99/67  Pulse: 79  Weight: 290 lb (131.5 kg)  Height: 5\' 8"  (1.727 m)   No exam data present   Body mass index is 44.09 kg/m.  GENERAL EXAM: Patient is in no distress; well developed, nourished and groomed; neck is supple  CARDIOVASCULAR:  Regular rate and rhythm, no murmurs, no carotid bruits  NEUROLOGIC: MENTAL STATUS: awake, alert, language fluent, comprehension intact, naming intact, fund of knowledge appropriate; SLOW SCANNING SPEECH PATTERN CRANIAL NERVE: PUPILS PINPOINT AND REACTIVE; visual fields full to confrontation, extraocular muscles intact; NO PTOSIS; facial sensation symmetric, FACE --> DEC LEFT NL FOLD, hearing intact, palate elevates symmetrically, uvula midline, shoulder shrug symmetric, tongue midline. MOTOR: INCREASED TONE IN BUE and BLE; BUE 3 PROX AND 2-3 DISTAL; BLE 2 SENSORY: DECR IN RIGHT HAND AND RIGHT FOOT TO ALL MODALITIES COORDINATION: BUE DYSMETRIA; SLOW FINGER AND FOOT TAP ON RIGHT REFLEXES: BUE 2; BLE KNEES 3, ANKLES 2 GAIT/STATION: IN MANUAL WHEELCHAIR    DIAGNOSTIC DATA (LABS, IMAGING, TESTING) - I reviewed patient records, labs, notes, testing and imaging myself where available.  Lab Results  Component Value Date   WBC 5.9 12/10/2020   HGB 14.2 12/10/2020   HCT 42.9 12/10/2020   MCV 94 12/10/2020   PLT 280 12/10/2020      Component Value Date/Time   NA 139 12/10/2020 1251   K 4.3 12/10/2020 1251   CL 104 12/10/2020 1251   CO2 23 12/10/2020 1251   GLUCOSE 86 12/10/2020 1251   GLUCOSE 91 08/30/2014 1031   BUN 11 12/10/2020 1251   CREATININE 0.93 12/10/2020 1251    CALCIUM 9.4 12/10/2020 1251   PROT 6.7 12/10/2020 1251   ALBUMIN 3.9 12/10/2020 1251   AST 12 12/10/2020 1251   ALT 12 12/10/2020 1251   ALKPHOS 88 12/10/2020 1251   BILITOT 0.4 12/10/2020 1251   GFRNONAA 76 12/10/2020 1251   GFRAA 87 12/10/2020 1251   No results found for: CHOL No results found for: HGBA1C No results found for: VITAMINB12 No results found for: TSH  Vit D, 25-Hydroxy  Date Value Ref Range Status  12/22/2016 34.1 30.0 - 100.0 ng/mL Final    Comment:    Vitamin D deficiency has been defined by the Institute of Medicine and an Endocrine Society practice guideline as a level of serum 25-OH vitamin D less than 20 ng/mL (1,2). The Endocrine Society went on to further define vitamin D insufficiency as a level between 21 and 29 ng/mL (2). 1. IOM (Institute of Medicine). 2010. Dietary reference    intakes for calcium and D. [place]: The    [employer]. 2. Holick MF, Binkley [place], Bischoff-Ferrari HA, et al.    Evaluation, treatment, and prevention of vitamin D    deficiency: an Endocrine Society clinical practice    guideline. JCEM. 2011 Jul; 96(7):1911-30.   10/22/2015 35.7 30.0 - 100.0 ng/mL Final    Comment:    Vitamin D deficiency has been defined by the Institute of Medicine and an Endocrine Society practice guideline as a level of serum 25-OH vitamin D less than 20 ng/mL (1,2). The Endocrine Society went on to further define vitamin D insufficiency as a level between 21 and 29 ng/mL (2). 1. IOM (Institute of Medicine). 2010. Dietary reference    intakes for calcium and D. [place]: The    [employer]. 2. Holick MF, Binkley [place], Bischoff-Ferrari HA, et al.    Evaluation, treatment, and prevention of vitamin D    deficiency: an Endocrine Society clinical practice    guideline. JCEM. 2011 Jul; 96(7):1911-30.   05/01/2015 25.3 (L) 30.0 - 100.0 ng/mL Final    Comment:    Vitamin D deficiency has been defined by the Institute  of Medicine and an Endocrine Society practice guideline as a  level of serum 25-OH vitamin D less than 20 ng/mL (1,2). The Endocrine Society went on to further define vitamin D insufficiency as a level between 21 and 29 ng/mL (2). 1. IOM (Institute of Medicine). 2010. Dietary reference    intakes for calcium and D. [place]: The    [employer]. 2. Holick MF, Binkley [place], Bischoff-Ferrari HA, et al.    Evaluation, treatment, and prevention of vitamin D    deficiency: an Endocrine Society clinical practice    guideline. JCEM. 2011 Jul; 96(7):1911-30.    Lymphocytes Absolute  Date Value Ref Range Status  12/10/2020 1.5 0.7 - 3.1 x10E3/uL Final  11/20/2019 1.4 0.7 - 3.1 x10E3/uL Final  04/18/2019 1.3 0.7 - 3.1 x10E3/uL Final    11/19/18 MRI of the brain with and without contrast shows the following: 1.    T2/flair hyperintense foci in the brainstem, cerebellum, left thalamus and hemispheres in a pattern and configuration consistent with chronic demyelinating plaque associated with multiple sclerosis.  None of the foci appear to be acute and there were no new lesions compared to the 2018 MRI. 2.    There is a normal enhancement pattern and there are no acute findings.  11/19/18 MRI of the cervical spine with and without contrast shows the following: 1.     There are about 8 T2 hyperintense foci within the spinal cord consistent with chronic demyelinating plaque associated with multiple sclerosis.  None of the foci appears to be acute.  They were all present on the MRI dated 11/05/2015. 2.     There are no significant cervical spine degenerative changes. 3.     There is a normal enhancement pattern and no acute findings.  05/23/20 MRI brain MRI brain without contrast demonstrating: -Mild stable periventricular, pericallosal and subcortical foci of chronic demyelinating plaques. -No acute finding.   Labs - ANA, ANCA, ACE, HIV, RPR - all negative  11/01/14 anti-JCV ab -  1.31 (H) positive  07/27/18 RLE u/s - No evidence of DVT and no evidence of venous insufficiency.    ASSESSMENT AND PLAN  45 y.o. year old female here with multiple focal neurologic attacks since 2008. Neurologic exam demonstrates long tract signs affecting the right arm and right leg more than left side. 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 Other labs negative.   On tecfidera since Feb 2016. Had flare up symptoms in Nov 2016 and Nov-Dec 2017. Also new plaque in Jan 2018. Now on ocrevus since May/June 2018.   Muscle spasms and leg pain (on baclofen, tizanidine and gabapentin).  Fatigue improved with medication regimen adjustment.   Bladder incontinence --> using adult pads and oxybutinin   Depression stable (seeing Dr. Lilian Kapur, [facility]).    Dx:  Multiple sclerosis (HCC)  Muscle spasm  Weakness of both hands  Other fatigue     PLAN:  Multiple sclerosis disease modifying therapy (established problem, progressive) - continue ocrevus - check CBC, CMP every 6 months - continue multivitamin and vitamin D supplements - face-face evaluation for power wheelchair today; read and agree with PT evaluation  COVID VACCINATION - completed moderna vaccine x 3 doses (last in Oct 2021)  MEMORY LOSS - optimize nutrition and exercise - follow up with psychiatry (optimize depression / anxiety treatments; consider caffeine vs stimulant meds) - safety / supervision issues reviewed - no driving, caution with meds, finances  Muscle spasms (established problem, stable) - continue baclofen 20mg  three times per day for muscle spasms -  continue tizanidine 4mg  three times per day for muscle spasms  Bilateral leg pain (established problem, stable) - continue gabapentin up to 600mg  three times per day  Bladder incontinence (established problem, improved) - consider follow up with refer to urology (tried and failed oxybutynin)  Depression  / anxiety (established problem, stable) - per Dr. Lilian Kapur [facility]) - continue lexapro  No orders of the defined types were placed in this encounter.  No follow-ups on file.    Suanne Marker, MD 03/31/2021, 1:13 PM Certified in Neurology, Neurophysiology and Neuroimaging  [facility] [address] [place], [place] [address] [phone]

## 2021-04-05 ENCOUNTER — Other Ambulatory Visit: Payer: Self-pay | Admitting: Diagnostic Neuroimaging

## 2021-04-15 ENCOUNTER — Other Ambulatory Visit: Payer: Self-pay

## 2021-04-15 ENCOUNTER — Encounter: Payer: Self-pay | Admitting: Surgical

## 2021-04-15 ENCOUNTER — Ambulatory Visit (INDEPENDENT_AMBULATORY_CARE_PROVIDER_SITE_OTHER): Payer: Medicare Other | Admitting: Surgical

## 2021-04-15 ENCOUNTER — Other Ambulatory Visit: Payer: Self-pay | Admitting: Surgical

## 2021-04-15 VITALS — BP 116/84 | HR 74 | Ht 68.0 in | Wt 284.0 lb

## 2021-04-15 DIAGNOSIS — N62 Hypertrophy of breast: Secondary | ICD-10-CM

## 2021-04-15 DIAGNOSIS — M545 Low back pain, unspecified: Secondary | ICD-10-CM

## 2021-04-15 DIAGNOSIS — M4004 Postural kyphosis, thoracic region: Secondary | ICD-10-CM

## 2021-04-15 DIAGNOSIS — M546 Pain in thoracic spine: Secondary | ICD-10-CM

## 2021-04-15 MED ORDER — TRAMADOL HCL 50 MG PO TABS: 50.0000 mg | ORAL_TABLET | Freq: Four times a day (QID) | ORAL | 0 refills | Status: AC | PRN

## 2021-04-15 MED ORDER — ENOXAPARIN SODIUM 40 MG/0.4ML IJ SOSY: 40.0000 mg | PREFILLED_SYRINGE | INTRAMUSCULAR | 0 refills | Status: DC

## 2021-04-15 MED ORDER — ONDANSETRON HCL 4 MG PO TABS: 4.0000 mg | ORAL_TABLET | Freq: Three times a day (TID) | ORAL | 0 refills | Status: DC | PRN

## 2021-04-15 NOTE — Progress Notes (Signed)
Patient ID: Ebony Warren, female    DOB: 05/14/1976, 45 y.o.   MRN: [id number]  Chief Complaint  Patient presents with  . Pre-op Exam      ICD-10-CM   1. Macromastia  N62   2. Back pain of thoracolumbar region  M54.50    M54.6   3. Postural kyphosis, thoracic region  M40.04      History of Present Illness: Ebony Warren is a 45 y.o.  female  with a history of macromastia.  She presents for preoperative evaluation for upcoming procedure, Bilateral Breast Reduction via amputation technique, scheduled for 05/12/2021 with Dr.  Arita MissPace  Patient reports no major complications with anesthesia.  She does report that she had some itching previously after anesthesia. No history of DVT/PE.  No family history of DVT/PE.  No family or personal history of bleeding or clotting disorders.  Patient is not currently taking any blood thinners.  No history of CVA/MI.   STN on the right is 50 cm and on the left is 47 cm. Estimated excess breast tissue to be removed at time of surgery: 1500 g on the left and 1500 g on the right.  PMH Significant for: Multiple sclerosis, GERD, obesity She is mostly wheelchair bound due to her MS.  She reports that she can walk short amount of distances.  She reports that overall she is feeling well, no recent changes in her health.  No infectious symptoms.  No fevers, chills, nausea, vomiting, chest pain, shortness of breath.   Past Medical History: Allergies: Allergies  Allergen Reactions  . Vicodin Hp [Hydrocodone-Acetaminophen] Itching  . Morphine Itching  . Orange Fruit [Citrus]     Acidic foods Makes her itchy and her face "feels like its on fire", have rash.  . Oxycodone Itching    rash  . Percocet [Oxycodone-Acetaminophen]     Stomach cramps   . Vicodin [Hydrocodone-Acetaminophen]     Stomach cramps   . Other Rash    Narcotics. Spicy foods (cause seizures)    Current Medications:  Current Outpatient Medications:  .   ARIPiprazole (ABILIFY) 5 MG tablet, Take 5 mg by mouth daily., Disp: , Rfl:  .  baclofen (LIORESAL) 10 MG tablet, TAKE 2 TABLETS(20 MG) BY MOUTH THREE TIMES DAILY, Disp: 180 tablet, Rfl: 11 .  benzonatate (TESSALON PERLES) 100 MG capsule, Take 1 capsule (100 mg total) by mouth 3 (three) times daily as needed for cough., Disp: 30 capsule, Rfl: 1 .  cholecalciferol (VITAMIN D) 1000 UNITS tablet, Take 1,000 Units by mouth daily. Take one a day for 6-12 months and we will recheck your levels when you come back in the office in 6 months, Disp: , Rfl:  .  diphenhydrAMINE (BENADRYL) 25 mg capsule, Take 25 mg by mouth every 6 (six) hours as needed. allergies, Disp: , Rfl:  .  DULoxetine (CYMBALTA) 30 MG capsule, Take 1 capsule (30 mg total) by mouth daily., Disp: 30 capsule, Rfl: 2 .  gabapentin (NEURONTIN) 300 MG capsule, TAKE 2 CAPSULES(600 MG) BY MOUTH THREE TIMES DAILY (Patient taking differently: 300 mg. TAKE 3 CAPSULES BY MOUTH THREE TIMES DAILY.), Disp: 180 capsule, Rfl: 12 .  LORazepam (ATIVAN) 1 MG tablet, Take 1 mg by mouth daily., Disp: , Rfl:  .  Multiple Vitamins-Minerals (MULTIVITAMIN ADULT PO), Take 1 tablet by mouth daily., Disp: , Rfl:  .  ocrelizumab (OCREVUS) 300 MG/10ML injection, Infuse 600 mg IV Every 6 months, Disp: 20 mL, Rfl: 1 .  ondansetron (  ZOFRAN) 4 MG tablet, Take 1 tablet (4 mg total) by mouth every 8 (eight) hours as needed for nausea or vomiting., Disp: 20 tablet, Rfl: 0 .  pantoprazole (PROTONIX) 40 MG tablet, Take 1 tablet (40 mg total) by mouth daily., Disp: 30 tablet, Rfl: 3 .  Semaglutide,0.25 or 0.5MG /DOS, 2 MG/1.5ML SOPN, Inject 0.5 mg into the skin once a week., Disp: 1.5 mL, Rfl: 0 .  tiZANidine (ZANAFLEX) 4 MG tablet, TAKE 1 TABLET(4 MG) BY MOUTH THREE TIMES DAILY, Disp: 90 tablet, Rfl: 12 .  traMADol (ULTRAM) 50 MG tablet, Take 1 tablet (50 mg total) by mouth every 6 (six) hours as needed for up to 5 days for severe pain., Disp: 20 tablet, Rfl: 0 .  acetaminophen  (TYLENOL) 325 MG tablet, Take 650 mg by mouth daily as needed for mild pain., Disp: , Rfl:   Past Medical Problems: Past Medical History:  Diagnosis Date  . Anxiety and depression   . Bradycardia   . Encounter to establish care 01/22/2021  . Multiple sclerosis (HCC)   . Screening mammogram, encounter for 01/22/2021  . 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 CHOLECYSTECTOMY    . KNEE SURGERY      Social History: Social History   Socioeconomic History  . Marital status: Single    Spouse name: Not on file  . Number of children: 1  . Years of education: College  . Highest education level: Not on file  Occupational History    Employer: OTHER    Comment: disabled  Tobacco Use  . Smoking status: Former Smoker    Packs/day: 0.35    Years: 3.00    Pack years: 1.05    Types: Cigarettes    Quit date: 12/07/2006    Years since quitting: 14.3  . Smokeless tobacco: Never Used  Substance and Sexual Activity  . Alcohol use: No    Alcohol/week: 0.0 standard drinks    Comment: quit: 2013 (socially)  . Drug use: Yes    Types: Marijuana    Comment: quit: 2001   . Sexual activity: Not on file  Other Topics Concern  . Not on file  Social History Narrative   Patient lives at home with family.   Caffeine Use: drinks a cup of hot tea with tecfidera in the am   Patient is right handed.   Patient has a college education   Social Determinants of Corporate investment banker Strain: Not on file  Food Insecurity: Not on file  Transportation Needs: Not on file  Physical Activity: Not on file  Stress: Not on file  Social Connections: Not on file  Intimate Partner Violence: Not on file    Family History: Family History  Problem Relation Age of Onset  . Hypertension Mother   . Atrial fibrillation Mother   . Hypertension Father   . Atrial fibrillation Maternal  Grandmother     Review of Systems: Review of Systems  Constitutional: Negative.   Respiratory: Negative.   Cardiovascular: Negative.   Gastrointestinal: Negative.   Musculoskeletal: Negative.   Neurological: Positive for weakness (Chronic).    Physical Exam: Vital Signs BP 116/84 (BP Location: Right Arm, Patient Position: Sitting, Cuff Size: Large)   Pulse 74   Ht 5\' 8"  (1.727 m)   Wt 284 lb (128.8 kg)   SpO2 98%   BMI 43.18  kg/m   Physical Exam  Constitutional:      General: Not in acute distress.  In wheelchair    Appearance: Normal appearance. Not ill-appearing.  HENT:     Head: Normocephalic and atraumatic.  Eyes:     Pupils: Pupils are equal, round Neck:     Musculoskeletal: Normal range of motion.  Cardiovascular:     Rate and Rhythm: Normal rate    Pulses: Normal pulses.  Pulmonary:     Effort: Pulmonary effort is normal. No respiratory distress.  Musculoskeletal: Normal range of motion.  Skin:    General: Skin is warm and dry.     Findings: No erythema or rash.  Neurological:     General: No focal deficit present.     Mental Status: Alert and oriented to person, place, and time. Mental status is at baseline.     Motor: No weakness.  Psychiatric:        Mood and Affect: Mood normal.        Behavior: Behavior normal.    Assessment/Plan: The patient is scheduled for bilateral breast reduction via amputation technique with Dr. Arita Miss.  Risks, benefits, and alternatives of procedure discussed, questions answered and consent obtained.    Smoking Status: Non-smoker; Counseling Given?  N/A Last Mammogram: Patient is scheduled for mammogram on 04/24/2021; Results: N/A  Caprini Score: 8, high; Risk Factors include: Age, BMI greater than 40, wheelchair-bound and unable to walk greater than 30 feet, current swollen legs, and length of planned surgery. Recommendation for mechanical and pharmacological prophylaxis for 7 to 10 days postoperatively. Encourage early  ambulation.  Given the patient's wheelchair-bound status and inability to walk greater than 30 feet at any single time she may benefit from postoperative Lovenox.  Will discuss with Dr. Arita Miss.   Pictures obtained: 02/27/2021  Post-op Rx sent to pharmacy: Norco, Zofran  Patient was provided with the breast reduction and General Surgical Risk consent document and Pain Medication Agreement prior to their appointment.  They had adequate time to read through the risk consent documents and Pain Medication Agreement. We also discussed them in person together during this preop appointment. All of their questions were answered to their satisfaction.  Recommended calling if they have any further questions.  Risk consent form and Pain Medication Agreement to be scanned into patient's chart.  The risk that can be encountered with breast reduction via amputation technique were discussed and include the following but not limited to these:  Breast asymmetry, fluid accumulation, firmness of the breast, inability to breast feed, loss of nipple or areola, skin loss, no nipple sensation, fat necrosis of the breast tissue, bleeding, infection, healing delay.  There are risks of anesthesia, changes to skin sensation and injury to nerves or blood vessels.  The muscle can be temporarily or permanently injured.  You may have an allergic reaction to tape, suture, glue, blood products which can result in skin discoloration, swelling, pain, skin lesions, poor healing.  Any of these can lead to the need for revisonal surgery or stage procedures.  A reduction has potential to interfere with diagnostic procedures.  Nipple or breast piercing can increase risks of infection.  This procedure is best done when the breast is fully developed.  Changes in the breast will continue to occur over time.  Pregnancy can alter the outcomes of previous breast reduction surgery, weight gain and weigh loss can also effect the long term appearance.    Patient is aware that with amputation technique bilateral breast  reduction she could have complete loss of the nipple areolar graft which would result in a wound which would require ongoing wound care.  She is also aware of irregular pigmentation of the grafted nipple areolar complex and insensate nipples.  She is also aware that due to her BMI she is at an increased risk of postoperative wound complications.  She is aware that further surgical invention may be necessary if she develops large wounds or has any complications that warrant additional surgery  Medical clearance received from patient's PCP for scheduled procedure   Electronically signed by: Leslee Home, PA-C 04/15/2021 11:30 AM

## 2021-04-15 NOTE — Progress Notes (Signed)
Post-op lovenox. Spoke with pt, informed start 24 hrs post-op. Answered pt questions.

## 2021-04-22 ENCOUNTER — Other Ambulatory Visit: Payer: Self-pay | Admitting: Internal Medicine

## 2021-04-24 ENCOUNTER — Ambulatory Visit
Admission: RE | Admit: 2021-04-24 | Discharge: 2021-04-24 | Disposition: A | Discharge status: Home / Self Care | Payer: Medicare Other | Source: Ambulatory Visit | Attending: Student in an Organized Health Care Education/Training Program | Admitting: Student in an Organized Health Care Education/Training Program

## 2021-04-24 ENCOUNTER — Encounter: Payer: Self-pay | Admitting: *Deleted

## 2021-04-24 ENCOUNTER — Other Ambulatory Visit: Payer: Self-pay

## 2021-04-24 ENCOUNTER — Ambulatory Visit: Payer: Medicare Other

## 2021-04-24 DIAGNOSIS — Z1231 Encounter for screening mammogram for malignant neoplasm of breast: Secondary | ICD-10-CM

## 2021-04-24 NOTE — Progress Notes (Unsigned)

## 2021-04-28 ENCOUNTER — Other Ambulatory Visit: Payer: Self-pay | Admitting: Student in an Organized Health Care Education/Training Program

## 2021-04-28 ENCOUNTER — Encounter: Payer: Self-pay | Admitting: Internal Medicine

## 2021-04-28 DIAGNOSIS — R928 Other abnormal and inconclusive findings on diagnostic imaging of breast: Secondary | ICD-10-CM

## 2021-04-28 NOTE — Progress Notes (Signed)
Things That May Be Affecting Your Health:  Alcohol  Hearing loss  Pain   x Depression  Home Safety  Sexual Health   Diabetes x Lack of physical activity  Stress  x Difficulty with daily activities  Loneliness x Tiredness   Drug use  Medicines  Tobacco use   Falls  Motor Vehicle Safety x Weight   Food choices  Oral Health  Other    YOUR PERSONALIZED HEALTH PLAN : 1. Schedule your next subsequent Medicare Wellness visit in one year 2. Attend all of your regular appointments to address your medical issues 3. Complete the preventative screenings and services   Annual Wellness Visit   Medicare Covered Preventative Screenings and Services  Services & Screenings Men and Women Who How Often Need? Date of Last Service Action  Abdominal Aortic Aneurysm Adults with AAA risk factors Once      Alcohol Misuse and Counseling All Adults Screening once a year if no alcohol misuse. Counseling up to 4 face to face sessions.     Bone Density Measurement  Adults at risk for osteoporosis Once every 2 yrs      Lipid Panel Z13.6 All adults without CV disease Once every 5 yrs x      Colorectal Cancer   Stool sample or  Colonoscopy All adults 50 and older   Once every year  Every 10 years        Depression All Adults Once a year  Today   Diabetes Screening Blood glucose, post glucose load, or GTT Z13.1  All adults at risk  Pre-diabetics  Once per year  Twice per year x     Diabetes  Self-Management Training All adults Diabetics 10 hrs first year; 2 hours subsequent years. Requires Copay     Glaucoma  Diabetics  Family history of glaucoma  African Americans 50 yrs +  Hispanic Americans 65 yrs + Annually - requires coppay      Hepatitis C Z72.89 or F19.20  High Risk for HCV  Born between 1945 and 1965  Annually  Once x     HIV Z11.4 All adults based on risk  Annually btw ages 39 & 33 regardless of risk  Annually > 65 yrs if at increased risk      Lung Cancer Screening  Asymptomatic adults aged 54-77 with 30 pack yr history and current smoker OR quit within the last 15 yrs Annually Must have counseling and shared decision making documentation before first screen      Medical Nutrition Therapy Adults with   Diabetes  Renal disease  Kidney transplant within past 3 yrs 3 hours first year; 2 hours subsequent years     Obesity and Counseling All adults Screening once a year Counseling if BMI 30 or higher  Today   Tobacco Use Counseling Adults who use tobacco  Up to 8 visits in one year     Vaccines Z23  Hepatitis B  Influenza   Pneumonia  Adults   Once  Once every flu season  Two different vaccines separated by one year     Next Annual Wellness Visit People with Medicare Every year  Today     Services & Screenings Women Who How Often Need  Date of Last Service Action  Mammogram  Z12.31 Women over 40 One baseline ages 77-39. Annually ager 40 yrs+ x     Pap tests All women Annually if high risk. Every 2 yrs for normal risk women x  Screening for cervical cancer with   Pap (Z01.419 nl or Z01.411abnl) &  HPV Z11.51 Women aged 87 to 67 Once every 5 yrs x    Screening pelvic and breast exams All women Annually if high risk. Every 2 yrs for normal risk women     Sexually Transmitted Diseases  Chlamydia  Gonorrhea  Syphilis All at risk adults Annually for non pregnant females at increased risk         Services & Screenings Men Who How Ofter Need  Date of Last Service Action  Prostate Cancer - DRE & PSA Men over 50 Annually.  DRE might require a copay.        Sexually Transmitted Diseases  Syphilis All at risk adults Annually for men at increased risk      Health Maintenance List Health Maintenance  Topic Date Due  . COVID-19 Vaccine (1) Never done  . Hepatitis C Screening  Never done  . TETANUS/TDAP  Never done  . PAP SMEAR-Modifier  Never done  . INFLUENZA VACCINE  07/07/2021  . HIV Screening  Completed  . HPV  VACCINES  Aged Out

## 2021-04-30 ENCOUNTER — Other Ambulatory Visit: Payer: Self-pay | Admitting: Student in an Organized Health Care Education/Training Program

## 2021-04-30 ENCOUNTER — Ambulatory Visit
Admission: RE | Admit: 2021-04-30 | Discharge: 2021-04-30 | Disposition: A | Discharge status: Home / Self Care | Payer: Medicare Other | Source: Ambulatory Visit | Attending: Student in an Organized Health Care Education/Training Program | Admitting: Student in an Organized Health Care Education/Training Program

## 2021-04-30 ENCOUNTER — Other Ambulatory Visit: Payer: Self-pay

## 2021-04-30 DIAGNOSIS — R928 Other abnormal and inconclusive findings on diagnostic imaging of breast: Secondary | ICD-10-CM

## 2021-05-01 ENCOUNTER — Encounter: Payer: Self-pay | Admitting: Internal Medicine

## 2021-05-01 ENCOUNTER — Other Ambulatory Visit: Payer: Self-pay

## 2021-05-01 ENCOUNTER — Other Ambulatory Visit: Payer: Self-pay | Admitting: Plastic Surgery

## 2021-05-01 ENCOUNTER — Ambulatory Visit (INDEPENDENT_AMBULATORY_CARE_PROVIDER_SITE_OTHER): Payer: Medicare Other | Admitting: Internal Medicine

## 2021-05-01 DIAGNOSIS — Z23 Encounter for immunization: Secondary | ICD-10-CM

## 2021-05-01 DIAGNOSIS — N63 Unspecified lump in unspecified breast: Secondary | ICD-10-CM

## 2021-05-01 MED ORDER — OZEMPIC (0.25 OR 0.5 MG/DOSE) 2 MG/1.5ML ~~LOC~~ SOPN: 1.0000 mg | PEN_INJECTOR | SUBCUTANEOUS | 2 refills | Status: DC

## 2021-05-01 NOTE — Patient Instructions (Addendum)
Thank you for trusting me with your care. To recap, today we discussed the following:  1. Morbid obesity (HCC) - Semaglutide,0.25 or 0.5MG /DOS, (OZEMPIC, 0.25 OR 0.5 MG/DOSE,) 2 MG/1.5ML SOPN; Inject 1 mg as directed once a week.  Dispense: 3 mL; Refill: 2

## 2021-05-01 NOTE — Progress Notes (Signed)
   CC: morbid obesity   HPI:[name] is a 45 y.o. female who presents for evaluation of morbid obesity. Please see individual problem based A/P for details.  Past Medical History:  Diagnosis Date  . Anxiety and depression   . Bradycardia   . Encounter to establish care 01/22/2021  . Multiple sclerosis (HCC)   . Screening mammogram, encounter for 01/22/2021  . Seizures (HCC)    told that they are not from the brain but stress related   Review of Systems:   Review of Systems  Constitutional: Positive for weight loss. Negative for malaise/fatigue.  Gastrointestinal: Negative for abdominal pain, diarrhea and nausea.     Physical Exam: Vitals:   05/01/21 0939  BP: 114/76  Pulse: 73  Temp: 98.1 F (36.7 C)  TempSrc: Oral  SpO2: 100%  Weight: 286 lb 14.4 oz (130.1 kg)  Height: 5\' 8"  (1.727 m)   General: Obese women, NAD HEENT: Conjunctiva nl , antiicteric sclerae, moist mucous membranes, no exudate or erythema Cardiovascular: Normal rate, regular rhythm.  No murmurs, rubs, or gallops Pulmonary : Equal breath sounds, No wheezes, rales, or rhonchi Abdominal: soft, nontender,  bowel sounds present   Assessment & Plan:   See Encounters Tab for problem based charting.  Patient discussed with Dr. 

## 2021-05-04 ENCOUNTER — Encounter: Payer: Self-pay | Admitting: Internal Medicine

## 2021-05-04 NOTE — Assessment & Plan Note (Signed)
Patient 89 on 4/14, Semaglutide increased to .5 mg weekly. Today she Is 286, motivated for continued weight loss. Denies any side effects to medication. Will increase to 1 mg dose. Patient will follow up in 1 month for further titration of medication then.    - Semaglutide,0.25 or 0.5MG /DOS, (OZEMPIC, 0.25 OR 0.5 MG/DOSE,) 2 MG/1.5ML SOPN; Inject 1 mg as directed once a week.  Dispense: 3 mL; Refill: 2

## 2021-05-08 ENCOUNTER — Other Ambulatory Visit (HOSPITAL_COMMUNITY): Payer: Medicare Other

## 2021-05-09 NOTE — Progress Notes (Signed)
[facility] Attending  Case discussed with Dr. Steen  At the time of the visit.  We reviewed the resident's history and exam and pertinent patient test results.  I agree with the assessment, diagnosis, and plan of care documented in the resident's note.  

## 2021-05-12 ENCOUNTER — Telehealth: Payer: Self-pay

## 2021-05-12 MED ORDER — OZEMPIC (1 MG/DOSE) 4 MG/3ML ~~LOC~~ SOPN: 1.0000 mg | PEN_INJECTOR | SUBCUTANEOUS | 1 refills | Status: DC

## 2021-05-12 NOTE — Telephone Encounter (Signed)
RTC to pharmacist, she states that Semaglutide is also available in 1mg  dose (dispense 85ml).  Asking if MD can resend RX for the 1mg  RX. Forwarding to blue team SChaplin, RN,BSN

## 2021-05-12 NOTE — Telephone Encounter (Signed)
Tim.Mull with [facility] pharmacy requesting to speak with a nurse about Semaglutide,0.25 or 0.5MG /DOS, (OZEMPIC, 0.25 OR 0.5 MG/DOSE,) 2 MG/1.5ML SOPN. Please call back.

## 2021-05-13 ENCOUNTER — Ambulatory Visit
Admission: RE | Admit: 2021-05-13 | Discharge: 2021-05-13 | Disposition: A | Discharge status: Home / Self Care | Payer: Medicare Other | Source: Ambulatory Visit | Attending: Plastic Surgery | Admitting: Plastic Surgery

## 2021-05-13 ENCOUNTER — Telehealth: Payer: Self-pay | Admitting: *Deleted

## 2021-05-13 ENCOUNTER — Other Ambulatory Visit: Payer: Self-pay

## 2021-05-13 DIAGNOSIS — N63 Unspecified lump in unspecified breast: Secondary | ICD-10-CM

## 2021-05-13 NOTE — Telephone Encounter (Signed)
Received fax from [employer]. Re: orders for power wheelchair. Placed on MD desk for signature.

## 2021-05-13 NOTE — Telephone Encounter (Signed)
Power wheelchair orders signed, faxed to [employer], received confirmation.

## 2021-05-14 ENCOUNTER — Other Ambulatory Visit: Payer: Self-pay | Admitting: Diagnostic Neuroimaging

## 2021-05-17 ENCOUNTER — Other Ambulatory Visit: Payer: Self-pay | Admitting: Diagnostic Neuroimaging

## 2021-05-17 ENCOUNTER — Other Ambulatory Visit: Payer: Self-pay | Admitting: Internal Medicine

## 2021-05-17 DIAGNOSIS — U071 COVID-19: Secondary | ICD-10-CM

## 2021-05-22 ENCOUNTER — Encounter: Payer: Medicare Other | Admitting: Plastic Surgery

## 2021-05-29 ENCOUNTER — Other Ambulatory Visit (HOSPITAL_COMMUNITY): Payer: Medicare Other

## 2021-05-30 ENCOUNTER — Encounter: Payer: Medicare Other | Admitting: Surgical

## 2021-06-06 ENCOUNTER — Other Ambulatory Visit: Payer: Self-pay | Admitting: Student

## 2021-06-10 ENCOUNTER — Other Ambulatory Visit: Payer: Self-pay

## 2021-06-10 ENCOUNTER — Telehealth: Payer: Self-pay | Admitting: *Deleted

## 2021-06-10 ENCOUNTER — Non-Acute Institutional Stay (HOSPITAL_COMMUNITY)
Admission: RE | Admit: 2021-06-10 | Discharge: 2021-06-10 | Disposition: A | Discharge status: Home / Self Care | Payer: Medicare Other | Source: Ambulatory Visit | Attending: Internal Medicine | Admitting: Internal Medicine

## 2021-06-10 ENCOUNTER — Encounter: Payer: Self-pay | Admitting: *Deleted

## 2021-06-10 DIAGNOSIS — G35 Multiple sclerosis: Secondary | ICD-10-CM | POA: Diagnosis present

## 2021-06-10 MED ORDER — SODIUM CHLORIDE 0.9 % IV SOLN
600.0000 mg | Freq: Once | INTRAVENOUS | Status: AC
  Administered 2021-06-10: 600 mg via INTRAVENOUS
  Filled 2021-06-10: qty 20

## 2021-06-10 MED ORDER — SODIUM CHLORIDE 0.9 % IV SOLN: INTRAVENOUS | Status: DC | PRN

## 2021-06-10 MED ORDER — METHYLPREDNISOLONE SODIUM SUCC 125 MG IJ SOLR
125.0000 mg | Freq: Once | INTRAMUSCULAR | Status: AC
  Administered 2021-06-10: 125 mg via INTRAVENOUS
  Filled 2021-06-10: qty 2

## 2021-06-10 MED ORDER — DIPHENHYDRAMINE HCL 50 MG/ML IJ SOLN
50.0000 mg | Freq: Once | INTRAMUSCULAR | Status: AC
  Administered 2021-06-10: 50 mg via INTRAVENOUS
  Filled 2021-06-10: qty 1

## 2021-06-10 MED ORDER — FAMOTIDINE IN NACL 20-0.9 MG/50ML-% IV SOLN
20.0000 mg | Freq: Once | INTRAVENOUS | Status: AC
  Administered 2021-06-10: 20 mg via INTRAVENOUS
  Filled 2021-06-10: qty 50

## 2021-06-10 MED ORDER — ACETAMINOPHEN 325 MG PO TABS
650.0000 mg | ORAL_TABLET | Freq: Once | ORAL | Status: AC
  Administered 2021-06-10: 650 mg via ORAL
  Filled 2021-06-10: qty 2

## 2021-06-10 NOTE — Telephone Encounter (Signed)
Received message from Clydene Fake RN, needs new Ocrevus Rx, also needs new PA. PA on CMM key: [id number]. ID# [id number], BIN [id number], [id number], Group- [id number]. PA sent to plan.

## 2021-06-10 NOTE — Progress Notes (Signed)
PATIENT CARE CENTER NOTE     Diagnosis: Multiple Sclerosis G 35     Provider: Joycelyn Schmid, MD     Procedure: Ocrevus infusion      Note: Patient received Ocrevus 600 mg infusion via PIV. Pre medications given per order. Infusion titrated per protocol. Patient tolerated well with no adverse reaction. Patient declined 1 hour observation post infusion. Vital signs stable. Discharge instructions given. Patient alert, oriented and ambulatory to wheelchair at discharge.

## 2021-06-10 NOTE — Telephone Encounter (Addendum)
Medicare Part D, Ocrevus approved 12/07/20 - 06/10/2022. Approval letter faxed to [facility] with [facility].

## 2021-06-11 ENCOUNTER — Encounter: Payer: Medicare Other | Admitting: Plastic Surgery

## 2021-06-12 ENCOUNTER — Encounter: Payer: Medicare Other | Admitting: Plastic Surgery

## 2021-06-17 ENCOUNTER — Ambulatory Visit: Payer: Medicare Other | Admitting: Diagnostic Neuroimaging

## 2021-06-19 ENCOUNTER — Encounter (HOSPITAL_COMMUNITY): Payer: Self-pay | Admitting: Plastic Surgery

## 2021-06-19 ENCOUNTER — Other Ambulatory Visit (HOSPITAL_COMMUNITY)
Admission: RE | Admit: 2021-06-19 | Discharge: 2021-06-19 | Disposition: A | Discharge status: Home / Self Care | Payer: Medicare Other | Source: Ambulatory Visit | Attending: Plastic Surgery | Admitting: Plastic Surgery

## 2021-06-19 DIAGNOSIS — Z20822 Contact with and (suspected) exposure to covid-19: Secondary | ICD-10-CM | POA: Diagnosis not present

## 2021-06-19 DIAGNOSIS — Z01812 Encounter for preprocedural laboratory examination: Secondary | ICD-10-CM | POA: Insufficient documentation

## 2021-06-19 LAB — SARS CORONAVIRUS 2 (TAT 6-24 HRS): SARS Coronavirus 2: NEGATIVE

## 2021-06-19 NOTE — Progress Notes (Signed)
Spoke with pt for pre-op call. Pt denies cardiac history, HTN and Diabetes. Pt does have MS.   Covid test done today. Pt to be admitted after surgery. Pt understands that she needs to wear a mask when she is out in public and around people that she does not normally live with.

## 2021-06-23 ENCOUNTER — Other Ambulatory Visit: Payer: Self-pay

## 2021-06-23 ENCOUNTER — Observation Stay (HOSPITAL_COMMUNITY)
Admission: RE | Admit: 2021-06-23 | Discharge: 2021-06-24 | Disposition: A | Discharge status: Home / Self Care | Payer: Medicare Other | Attending: Plastic Surgery | Admitting: Plastic Surgery

## 2021-06-23 ENCOUNTER — Encounter (HOSPITAL_COMMUNITY): Payer: Self-pay | Admitting: Plastic Surgery

## 2021-06-23 ENCOUNTER — Ambulatory Visit (HOSPITAL_COMMUNITY): Payer: Medicare Other | Admitting: Certified Registered"

## 2021-06-23 ENCOUNTER — Encounter (HOSPITAL_COMMUNITY)
Admission: RE | Disposition: A | Discharge status: Home / Self Care | Payer: Self-pay | Source: Home / Self Care | Attending: Plastic Surgery

## 2021-06-23 DIAGNOSIS — N62 Hypertrophy of breast: Secondary | ICD-10-CM | POA: Diagnosis not present

## 2021-06-23 DIAGNOSIS — Z79899 Other long term (current) drug therapy: Secondary | ICD-10-CM | POA: Diagnosis not present

## 2021-06-23 DIAGNOSIS — Z87891 Personal history of nicotine dependence: Secondary | ICD-10-CM | POA: Diagnosis not present

## 2021-06-23 DIAGNOSIS — M4004 Postural kyphosis, thoracic region: Secondary | ICD-10-CM | POA: Insufficient documentation

## 2021-06-23 DIAGNOSIS — M545 Low back pain, unspecified: Secondary | ICD-10-CM | POA: Insufficient documentation

## 2021-06-23 DIAGNOSIS — M546 Pain in thoracic spine: Secondary | ICD-10-CM | POA: Insufficient documentation

## 2021-06-23 HISTORY — DX: Headache, unspecified: R51.9

## 2021-06-23 HISTORY — PX: BREAST REDUCTION SURGERY: SHX8

## 2021-06-23 HISTORY — DX: Unspecified osteoarthritis, unspecified site: M19.90

## 2021-06-23 HISTORY — DX: Family history of other specified conditions: Z84.89

## 2021-06-23 HISTORY — DX: Gastro-esophageal reflux disease without esophagitis: K21.9

## 2021-06-23 LAB — POCT I-STAT, CHEM 8
BUN: 12 mg/dL (ref 6–20)
Calcium, Ion: 1.18 mmol/L (ref 1.15–1.40)
Chloride: 104 mmol/L (ref 98–111)
Creatinine, Ser: 0.7 mg/dL (ref 0.44–1.00)
Glucose, Bld: 85 mg/dL (ref 70–99)
HCT: 45 % (ref 36.0–46.0)
Hemoglobin: 15.3 g/dL — ABNORMAL HIGH (ref 12.0–15.0)
Potassium: 4.2 mmol/L (ref 3.5–5.1)
Sodium: 139 mmol/L (ref 135–145)
TCO2: 25 mmol/L (ref 22–32)

## 2021-06-23 LAB — POCT PREGNANCY, URINE: Preg Test, Ur: NEGATIVE

## 2021-06-23 SURGERY — MAMMOPLASTY, REDUCTION
Anesthesia: General | Site: Breast | Laterality: Bilateral

## 2021-06-23 MED ORDER — SUGAMMADEX SODIUM 200 MG/2ML IV SOLN
INTRAVENOUS | Status: DC | PRN
  Administered 2021-06-23: 200 mg via INTRAVENOUS

## 2021-06-23 MED ORDER — PROCHLORPERAZINE EDISYLATE 10 MG/2ML IJ SOLN: 5.0000 mg | Freq: Four times a day (QID) | INTRAMUSCULAR | Status: DC | PRN

## 2021-06-23 MED ORDER — ACETAMINOPHEN 650 MG RE SUPP: 650.0000 mg | Freq: Four times a day (QID) | RECTAL | Status: DC | PRN

## 2021-06-23 MED ORDER — LACTATED RINGERS IV SOLN: INTRAVENOUS | Status: DC

## 2021-06-23 MED ORDER — HYDROMORPHONE HCL 1 MG/ML IJ SOLN
0.5000 mg | INTRAMUSCULAR | Status: DC | PRN
  Administered 2021-06-23 – 2021-06-24 (×3): 0.5 mg via INTRAVENOUS
  Filled 2021-06-23 (×2): qty 0.5

## 2021-06-23 MED ORDER — OXYCODONE-ACETAMINOPHEN 5-325 MG PO TABS
1.0000 | ORAL_TABLET | ORAL | Status: DC | PRN
  Administered 2021-06-23 – 2021-06-24 (×2): 2 via ORAL
  Filled 2021-06-23 (×2): qty 2

## 2021-06-23 MED ORDER — ARIPIPRAZOLE 5 MG PO TABS
5.0000 mg | ORAL_TABLET | Freq: Every day | ORAL | Status: DC
  Administered 2021-06-24: 5 mg via ORAL
  Filled 2021-06-23: qty 1

## 2021-06-23 MED ORDER — DIPHENHYDRAMINE HCL 25 MG PO CAPS: 25.0000 mg | ORAL_CAPSULE | Freq: Four times a day (QID) | ORAL | Status: DC | PRN

## 2021-06-23 MED ORDER — SPIRONOLACTONE 25 MG PO TABS
25.0000 mg | ORAL_TABLET | Freq: Every day | ORAL | Status: DC
  Administered 2021-06-24: 25 mg via ORAL
  Filled 2021-06-23: qty 1

## 2021-06-23 MED ORDER — MIDAZOLAM HCL 5 MG/5ML IJ SOLN
INTRAMUSCULAR | Status: DC | PRN
  Administered 2021-06-23: 2 mg via INTRAVENOUS

## 2021-06-23 MED ORDER — FENTANYL CITRATE (PF) 250 MCG/5ML IJ SOLN
INTRAMUSCULAR | Status: AC
  Filled 2021-06-23: qty 5

## 2021-06-23 MED ORDER — HYDROMORPHONE HCL 1 MG/ML IJ SOLN
INTRAMUSCULAR | Status: AC
  Filled 2021-06-23: qty 1

## 2021-06-23 MED ORDER — ACETAMINOPHEN 325 MG PO TABS
650.0000 mg | ORAL_TABLET | Freq: Four times a day (QID) | ORAL | Status: DC | PRN
  Administered 2021-06-24: 650 mg via ORAL
  Filled 2021-06-23: qty 2

## 2021-06-23 MED ORDER — FENTANYL CITRATE (PF) 100 MCG/2ML IJ SOLN
INTRAMUSCULAR | Status: AC
  Filled 2021-06-23: qty 2

## 2021-06-23 MED ORDER — GABAPENTIN 300 MG PO CAPS
300.0000 mg | ORAL_CAPSULE | Freq: Three times a day (TID) | ORAL | Status: DC
  Administered 2021-06-23 – 2021-06-24 (×3): 300 mg via ORAL
  Filled 2021-06-23 (×3): qty 1

## 2021-06-23 MED ORDER — PANTOPRAZOLE SODIUM 40 MG PO TBEC
40.0000 mg | DELAYED_RELEASE_TABLET | Freq: Every day | ORAL | Status: DC
  Administered 2021-06-24: 40 mg via ORAL
  Filled 2021-06-23: qty 1

## 2021-06-23 MED ORDER — ORAL CARE MOUTH RINSE: 15.0000 mL | Freq: Once | OROMUCOSAL | Status: AC

## 2021-06-23 MED ORDER — PHENYLEPHRINE 40 MCG/ML (10ML) SYRINGE FOR IV PUSH (FOR BLOOD PRESSURE SUPPORT)
PREFILLED_SYRINGE | INTRAVENOUS | Status: DC | PRN
  Administered 2021-06-23: 40 ug via INTRAVENOUS
  Administered 2021-06-23 (×2): 80 ug via INTRAVENOUS
  Administered 2021-06-23 (×2): 40 ug via INTRAVENOUS

## 2021-06-23 MED ORDER — LIDOCAINE 2% (20 MG/ML) 5 ML SYRINGE
INTRAMUSCULAR | Status: AC
  Filled 2021-06-23: qty 5

## 2021-06-23 MED ORDER — ONDANSETRON HCL 4 MG/2ML IJ SOLN: 4.0000 mg | Freq: Four times a day (QID) | INTRAMUSCULAR | Status: DC | PRN

## 2021-06-23 MED ORDER — FENTANYL CITRATE (PF) 100 MCG/2ML IJ SOLN
25.0000 ug | INTRAMUSCULAR | Status: DC | PRN
  Administered 2021-06-23 (×3): 50 ug via INTRAVENOUS

## 2021-06-23 MED ORDER — DEXAMETHASONE SODIUM PHOSPHATE 10 MG/ML IJ SOLN
INTRAMUSCULAR | Status: DC | PRN
Start: 2021-06-23 — End: 2021-06-23
  Administered 2021-06-23: 10 mg via INTRAVENOUS

## 2021-06-23 MED ORDER — 0.9 % SODIUM CHLORIDE (POUR BTL) OPTIME
TOPICAL | Status: DC | PRN
  Administered 2021-06-23: 1000 mL

## 2021-06-23 MED ORDER — LORAZEPAM 1 MG PO TABS
1.0000 mg | ORAL_TABLET | Freq: Every day | ORAL | Status: DC
  Administered 2021-06-24: 1 mg via ORAL
  Filled 2021-06-23: qty 1

## 2021-06-23 MED ORDER — FENTANYL CITRATE (PF) 100 MCG/2ML IJ SOLN
INTRAMUSCULAR | Status: DC | PRN
  Administered 2021-06-23: 50 ug via INTRAVENOUS
  Administered 2021-06-23: 100 ug via INTRAVENOUS
  Administered 2021-06-23 (×2): 50 ug via INTRAVENOUS

## 2021-06-23 MED ORDER — LIDOCAINE HCL 1 % IJ SOLN
INTRAVENOUS | Status: DC | PRN
  Administered 2021-06-23 (×2): 1000 mL

## 2021-06-23 MED ORDER — PROPOFOL 10 MG/ML IV BOLUS
INTRAVENOUS | Status: DC | PRN
  Administered 2021-06-23: 180 mg via INTRAVENOUS

## 2021-06-23 MED ORDER — ONDANSETRON 4 MG PO TBDP: 4.0000 mg | ORAL_TABLET | Freq: Four times a day (QID) | ORAL | Status: DC | PRN

## 2021-06-23 MED ORDER — DIPHENHYDRAMINE HCL 50 MG/ML IJ SOLN: 25.0000 mg | Freq: Four times a day (QID) | INTRAMUSCULAR | Status: DC | PRN

## 2021-06-23 MED ORDER — LIDOCAINE 2% (20 MG/ML) 5 ML SYRINGE
INTRAMUSCULAR | Status: DC | PRN
  Administered 2021-06-23: 60 mg via INTRAVENOUS

## 2021-06-23 MED ORDER — DULOXETINE HCL 30 MG PO CPEP
30.0000 mg | ORAL_CAPSULE | Freq: Every day | ORAL | Status: DC
  Administered 2021-06-24: 30 mg via ORAL
  Filled 2021-06-23: qty 1

## 2021-06-23 MED ORDER — PROCHLORPERAZINE MALEATE 10 MG PO TABS
10.0000 mg | ORAL_TABLET | Freq: Four times a day (QID) | ORAL | Status: DC | PRN
  Filled 2021-06-23: qty 1

## 2021-06-23 MED ORDER — PHENOL 1.4 % MT LIQD
1.0000 | OROMUCOSAL | Status: DC | PRN
  Filled 2021-06-23: qty 177

## 2021-06-23 MED ORDER — CEFAZOLIN SODIUM-DEXTROSE 2-4 GM/100ML-% IV SOLN
2.0000 g | INTRAVENOUS | Status: AC
  Administered 2021-06-23: 1 g via INTRAVENOUS
  Administered 2021-06-23: 2 g via INTRAVENOUS
  Filled 2021-06-23: qty 100

## 2021-06-23 MED ORDER — PHENYLEPHRINE 40 MCG/ML (10ML) SYRINGE FOR IV PUSH (FOR BLOOD PRESSURE SUPPORT)
PREFILLED_SYRINGE | INTRAVENOUS | Status: AC
  Filled 2021-06-23: qty 10

## 2021-06-23 MED ORDER — BENZONATATE 100 MG PO CAPS: 100.0000 mg | ORAL_CAPSULE | Freq: Three times a day (TID) | ORAL | Status: DC | PRN

## 2021-06-23 MED ORDER — ROCURONIUM BROMIDE 100 MG/10ML IV SOLN
INTRAVENOUS | Status: DC | PRN
  Administered 2021-06-23: 20 mg via INTRAVENOUS
  Administered 2021-06-23: 10 mg via INTRAVENOUS
  Administered 2021-06-23: 70 mg via INTRAVENOUS

## 2021-06-23 MED ORDER — MIDAZOLAM HCL 2 MG/2ML IJ SOLN
INTRAMUSCULAR | Status: AC
  Filled 2021-06-23: qty 2

## 2021-06-23 MED ORDER — TRAMADOL HCL 50 MG PO TABS
ORAL_TABLET | ORAL | Status: AC
  Administered 2021-06-23: 50 mg
  Filled 2021-06-23: qty 1

## 2021-06-23 MED ORDER — ONDANSETRON HCL 4 MG/2ML IJ SOLN
INTRAMUSCULAR | Status: DC | PRN
  Administered 2021-06-23: 4 mg via INTRAVENOUS

## 2021-06-23 MED ORDER — OXYCODONE-ACETAMINOPHEN 5-325 MG PO TABS
ORAL_TABLET | ORAL | Status: AC
  Filled 2021-06-23: qty 2

## 2021-06-23 MED ORDER — ROCURONIUM BROMIDE 10 MG/ML (PF) SYRINGE
PREFILLED_SYRINGE | INTRAVENOUS | Status: AC
  Filled 2021-06-23: qty 10

## 2021-06-23 MED ORDER — DEXAMETHASONE SODIUM PHOSPHATE 10 MG/ML IJ SOLN
INTRAMUSCULAR | Status: AC
  Filled 2021-06-23: qty 1

## 2021-06-23 MED ORDER — PROPOFOL 10 MG/ML IV BOLUS
INTRAVENOUS | Status: AC
  Filled 2021-06-23: qty 20

## 2021-06-23 MED ORDER — SODIUM BICARBONATE 4.2 % IV SOLN
Freq: Once | INTRAVENOUS | Status: DC
  Filled 2021-06-23 (×2): qty 50

## 2021-06-23 MED ORDER — CHLORHEXIDINE GLUCONATE 0.12 % MT SOLN
15.0000 mL | Freq: Once | OROMUCOSAL | Status: AC
  Administered 2021-06-23: 15 mL via OROMUCOSAL
  Filled 2021-06-23: qty 15

## 2021-06-23 MED ORDER — TIZANIDINE HCL 4 MG PO TABS
4.0000 mg | ORAL_TABLET | Freq: Three times a day (TID) | ORAL | Status: DC
  Administered 2021-06-23 – 2021-06-24 (×3): 4 mg via ORAL
  Filled 2021-06-23 (×3): qty 1

## 2021-06-23 MED ORDER — BACLOFEN 20 MG PO TABS
20.0000 mg | ORAL_TABLET | Freq: Three times a day (TID) | ORAL | Status: DC
  Administered 2021-06-23 – 2021-06-24 (×2): 20 mg via ORAL
  Filled 2021-06-23 (×2): qty 1

## 2021-06-23 MED ORDER — ONDANSETRON HCL 4 MG/2ML IJ SOLN
INTRAMUSCULAR | Status: AC
  Filled 2021-06-23: qty 2

## 2021-06-23 SURGICAL SUPPLY — 64 items
APL PRP STRL LF DISP 70% ISPRP (MISCELLANEOUS) ×2
APL SKNCLS STERI-STRIP NONHPOA (GAUZE/BANDAGES/DRESSINGS) ×2
BAG COUNTER SPONGE SURGICOUNT (BAG) IMPLANT
BAG DECANTER FOR FLEXI CONT (MISCELLANEOUS) ×2 IMPLANT
BAG SPNG CNTER NS LX DISP (BAG)
BENZOIN TINCTURE PRP APPL 2/3 (GAUZE/BANDAGES/DRESSINGS) ×4 IMPLANT
BLADE SURG 10 STRL SS (BLADE) ×2 IMPLANT
BLADE SURG 15 STRL LF DISP TIS (BLADE) ×1 IMPLANT
BLADE SURG 15 STRL SS (BLADE) ×2
BNDG CMPR MED 15X6 ELC VLCR LF (GAUZE/BANDAGES/DRESSINGS) ×1
BNDG ELASTIC 6X15 VLCR STRL LF (GAUZE/BANDAGES/DRESSINGS) ×1 IMPLANT
BNDG GAUZE ELAST 4 BULKY (GAUZE/BANDAGES/DRESSINGS) IMPLANT
CHLORAPREP W/TINT 26 (MISCELLANEOUS) ×4 IMPLANT
CLIP VESOCCLUDE MED 6/CT (CLIP) IMPLANT
COVER SURGICAL LIGHT HANDLE (MISCELLANEOUS) ×1 IMPLANT
DECANTER SPIKE VIAL GLASS SM (MISCELLANEOUS) IMPLANT
DRAIN CHANNEL 15F RND FF W/TCR (WOUND CARE) IMPLANT
DRAPE LAPAROSCOPIC ABDOMINAL (DRAPES) ×2 IMPLANT
DRAPE UTILITY XL STRL (DRAPES) ×2 IMPLANT
DRSG PAD ABDOMINAL 8X10 ST (GAUZE/BANDAGES/DRESSINGS) ×4 IMPLANT
ELECT REM PT RETURN 9FT ADLT (ELECTROSURGICAL) ×2
ELECTRODE REM PT RTRN 9FT ADLT (ELECTROSURGICAL) ×1 IMPLANT
EVACUATOR SILICONE 100CC (DRAIN) IMPLANT
GAUZE SPONGE 4X4 12PLY STRL (GAUZE/BANDAGES/DRESSINGS) ×4 IMPLANT
GAUZE SPONGE 4X4 12PLY STRL LF (GAUZE/BANDAGES/DRESSINGS) ×1 IMPLANT
GAUZE XEROFORM 5X9 LF (GAUZE/BANDAGES/DRESSINGS) ×2 IMPLANT
GLOVE SRG 8 PF TXTR STRL LF DI (GLOVE) IMPLANT
GLOVE SURG ENC MOIS LTX SZ6.5 (GLOVE) IMPLANT
GLOVE SURG ENC MOIS LTX SZ7.5 (GLOVE) IMPLANT
GLOVE SURG ENC TEXT LTX SZ7.5 (GLOVE) ×2 IMPLANT
GLOVE SURG LTX SZ6.5 (GLOVE) IMPLANT
GLOVE SURG UNDER POLY LF SZ8 (GLOVE)
GOWN STRL REUS W/ TWL LRG LVL3 (GOWN DISPOSABLE) ×2 IMPLANT
GOWN STRL REUS W/TWL LRG LVL3 (GOWN DISPOSABLE) ×4
KIT BASIN OR (CUSTOM PROCEDURE TRAY) ×2 IMPLANT
MARKER SKIN DUAL TIP RULER LAB (MISCELLANEOUS) IMPLANT
NDL SAFETY ECLIPSE 18X1.5 (NEEDLE) IMPLANT
NDL SPNL 18GX3.5 QUINCKE PK (NEEDLE) ×1 IMPLANT
NEEDLE HYPO 18GX1.5 SHARP (NEEDLE)
NEEDLE SPNL 18GX3.5 QUINCKE PK (NEEDLE) ×4 IMPLANT
NS IRRIG 1000ML POUR BTL (IV SOLUTION) ×2 IMPLANT
PACK GENERAL/GYN (CUSTOM PROCEDURE TRAY) ×2 IMPLANT
PAD ABD 8X10 STRL (GAUZE/BANDAGES/DRESSINGS) ×1 IMPLANT
PENCIL SMOKE EVACUATOR (MISCELLANEOUS) ×2 IMPLANT
PIN SAFETY STERILE (MISCELLANEOUS) IMPLANT
SHEET MEDIUM DRAPE 40X70 STRL (DRAPES) IMPLANT
SLEEVE SCD COMPRESS KNEE MED (STOCKING) IMPLANT
SPONGE T-LAP 18X18 ~~LOC~~+RFID (SPONGE) ×2 IMPLANT
STAPLER INSORB 30 2030 C-SECTI (MISCELLANEOUS) ×3 IMPLANT
STAPLER VISISTAT 35W (STAPLE) ×1 IMPLANT
STRIP CLOSURE SKIN 1/2X4 (GAUZE/BANDAGES/DRESSINGS) ×4 IMPLANT
SUT CHROMIC 4 0 PS 2 18 (SUTURE) ×4 IMPLANT
SUT ETHILON 2 0 FS 18 (SUTURE) IMPLANT
SUT ETHILON 3 0 PS 1 (SUTURE) ×4 IMPLANT
SUT MNCRL AB 4-0 PS2 18 (SUTURE) IMPLANT
SUT PDS AB 2-0 CT2 27 (SUTURE) IMPLANT
SUT VIC AB 3-0 PS1 18 (SUTURE) ×4
SUT VIC AB 3-0 PS1 18XBRD (SUTURE) ×2 IMPLANT
SUT VLOC 90 P-14 23 (SUTURE) ×4 IMPLANT
SYR 50ML LL SCALE MARK (SYRINGE) ×4 IMPLANT
TAPE MEASURE VINYL STERILE (MISCELLANEOUS) IMPLANT
TOWEL GREEN STERILE FF (TOWEL DISPOSABLE) ×4 IMPLANT
TRAY FOLEY W/BAG SLVR 14FR LF (SET/KITS/TRAYS/PACK) IMPLANT
UNDERPAD 30X36 HEAVY ABSORB (UNDERPADS AND DIAPERS) ×4 IMPLANT

## 2021-06-23 NOTE — Interval H&P Note (Signed)
History and Physical Interval Note:  06/23/2021 11:27 AM  Ebony Warren  has presented today for surgery, with the diagnosis of macromastia.  The various methods of treatment have been discussed with the patient and family. After consideration of risks, benefits and other options for treatment, the patient has consented to  Procedure(s): MAMMARY REDUCTION  (BREAST) (Bilateral) as a surgical intervention.  The patient's history has been reviewed, patient examined, no change in status, stable for surgery.  I have reviewed the patient's chart and labs.  Questions were answered to the patient's satisfaction.     Allena Napoleon

## 2021-06-23 NOTE — Discharge Instructions (Signed)
Activity As tolerated: NO showers until followup No heavy activities  Diet: Regular  Wound Care: Keep dressing clean & dry.  Keep wrap applied with compression as much as possible.    Do not change dressings for 3 days unless soiled.  Can change if needed but make sure to reapply wrap. Continue compression with wrap or soft sports bra. Call doctor if any unusual problems occur such as pain, excessive bleeding, unrelieved nausea/vomiting, fever &/or chills  Follow-up appointment: Scheduled for next week.

## 2021-06-23 NOTE — Transfer of Care (Signed)
Immediate Anesthesia Transfer of Care Note  Patient: Ebony Warren  Procedure(s) Performed: MAMMARY REDUCTION  (BREAST) (Bilateral: Breast)  Patient Location: PACU  Anesthesia Type:General  Level of Consciousness: drowsy and patient cooperative  Airway & Oxygen Therapy: Patient Spontanous Breathing  Post-op Assessment: Report given to RN and Post -op Vital signs reviewed and stable  Post vital signs: Reviewed and stable  Last Vitals:  Vitals Value Taken Time  BP 118/78 06/23/21 1457  Temp    Pulse 99 06/23/21 1458  Resp 9 06/23/21 1458  SpO2 98 % 06/23/21 1458  Vitals shown include unvalidated device data.  Last Pain:  Vitals:   06/23/21 1108  TempSrc:   PainSc: 0-No pain      Patients Stated Pain Goal: 2 (06/23/21 1108)  Complications: No notable events documented.

## 2021-06-23 NOTE — Anesthesia Procedure Notes (Signed)
Procedure Name: Intubation Date/Time: 06/23/2021 12:04 PM Performed by: Ezequiel Kayser, CRNA Pre-anesthesia Checklist: Patient identified, Emergency Drugs available, Suction available and Patient being monitored Patient Re-evaluated:Patient Re-evaluated prior to induction Oxygen Delivery Method: Circle System Utilized Preoxygenation: Pre-oxygenation with 100% oxygen Induction Type: IV induction Ventilation: Mask ventilation without difficulty Laryngoscope Size: Mac and 4 Grade View: Grade I Tube type: Oral Tube size: 7.0 mm Number of attempts: 1 Airway Equipment and Method: Stylet and Oral airway Placement Confirmation: ETT inserted through vocal cords under direct vision, positive ETCO2 and breath sounds checked- equal and bilateral Secured at: 22 cm Tube secured with: Tape Dental Injury: Teeth and Oropharynx as per pre-operative assessment  Comments: 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

## 2021-06-23 NOTE — Op Note (Signed)
Operative Note   DATE OF OPERATION: 06/23/2021  LOCATION: [facility]   SURGICAL DEPARTMENT: Plastic Surgery  PREOPERATIVE DIAGNOSES: Bilateral symptomatic macromastia.  POSTOPERATIVE DIAGNOSES:  same  PROCEDURE: Bilateral breast reduction with free nipple graft.  SURGEON: Ancil Linsey, MD  ASSISTANT: Enedina Finner, RNFA The advanced practice practitioner (APP) assisted throughout the case.  The APP was essential in retraction and counter traction when needed to make the case progress smoothly.  This retraction and assistance made it possible to see the tissue plans for the procedure.  The assistance was needed for blood control, tissue re-approximation and assisted with closure of the incision site.  ANESTHESIA: General.  COMPLICATIONS: None.   INDICATIONS FOR PROCEDURE:  The patient, Ebony Warren is a 45 y.o. female born on [birth year], is here for treatment of bilateral symptomatic macromastia. MRN: [id number]  CONSENT:  Informed consent was obtained directly from the patient. Risks, benefits and alternatives were fully discussed. Specific risks including but not limited to bleeding, infection, hematoma, seroma, scarring, pain, infection, contracture, asymmetry, wound healing problems, nipple necrosis, nipple numbness and need for further surgery were all discussed. The patient did have an ample opportunity to have questions answered to satisfaction.   DESCRIPTION OF PROCEDURE:  The patient was marked preoperatively for a Wise pattern skin excision.  The patient was taken to the operating room. SCDs were placed and antibiotics were given. General anesthesia was administered.The patient's operative site was prepped and draped in a sterile fashion. A time out was performed and all information was confirmed to be correct.  Right Breast: The breast was infiltrated with tumescent solution to help with hemostasis.  The nipple was marked with a cookie  cutter and excised in preparation for grafting.  The excision was then performed with cautery.  Hemostasis was obtained and the wound was stapled closed.  Left breast:  The breast was infiltrated with tumescent solution to help with hemostasis.  The nipple was marked with a cookie cutter and excised in preparation for grafting.  The excision was then performed with cautery.  Hemostasis was obtained and the wound was stapled closed.  Patient was then checked for size and symmetry.  Minor modifications were made.  This resulted in a total of 3156g removed from the right side and 3117g removed from the left side.  The inframammary incision was closed with a combination of buried in-sorb staples and a running 3-0 Quill suture.  The vertical and limbs were closed with interrupted buried 4-0 Monocryl and a running 4-0 Quill suture.  Nipple location was marked with a 78mm cookie cutter and deepithelialized.  Nipple grafts were then thinned out and inset with 4-0 Chromic.  Xeroform and a scrub brush foam were used to fashion and bolster that was secured with 4-0 Nylon.  Steri-Strips were then applied along with a soft dressing and Ace wrap.  The patient tolerated the procedure well.  There were no complications. The patient was allowed to wake from anesthesia, extubated and taken to the recovery room in satisfactory condition.  I was present for the entire procedure.

## 2021-06-23 NOTE — Anesthesia Postprocedure Evaluation (Signed)
Anesthesia Post Note  Patient: Ebony Warren  Procedure(s) Performed: MAMMARY REDUCTION  (BREAST) (Bilateral: Breast)     Patient location during evaluation: PACU Anesthesia Type: General Level of consciousness: awake and alert Pain management: pain level controlled Vital Signs Assessment: post-procedure vital signs reviewed and stable Respiratory status: spontaneous breathing, nonlabored ventilation, respiratory function stable and patient connected to nasal cannula oxygen Cardiovascular status: blood pressure returned to baseline and stable Postop Assessment: no apparent nausea or vomiting Anesthetic complications: no   No notable events documented.  Last Vitals:  Vitals:   06/23/21 1058 06/23/21 1457  BP: 122/75 118/78  Pulse: 78 99  Resp: 17 16  Temp: 36.9 C (!) 36.3 C  SpO2: 100% 95%    Last Pain:  Vitals:   06/23/21 1457  TempSrc:   PainSc: 3                  Anea Fodera S

## 2021-06-23 NOTE — Anesthesia Preprocedure Evaluation (Signed)
Anesthesia Evaluation  Patient identified by MRN, date of birth, ID band Patient awake    Reviewed: Allergy & Precautions, H&P , NPO status , Patient's Chart, lab work & pertinent test results  Airway Mallampati: II   Neck ROM: full    Dental   Pulmonary former smoker,    breath sounds clear to auscultation       Cardiovascular negative cardio ROS   Rhythm:regular Rate:Normal     Neuro/Psych  Headaches, Seizures -,  PSYCHIATRIC DISORDERS Anxiety Depression Multiple sclerosis    GI/Hepatic GERD  ,  Endo/Other  Morbid obesity  Renal/GU      Musculoskeletal  (+) Arthritis ,   Abdominal   Peds  Hematology   Anesthesia Other Findings   Reproductive/Obstetrics                             Anesthesia Physical Anesthesia Plan  ASA: 2  Anesthesia Plan: General   Post-op Pain Management:    Induction: Intravenous  PONV Risk Score and Plan: 3 and Ondansetron, Dexamethasone, Midazolam and Treatment may vary due to age or medical condition  Airway Management Planned: Oral ETT  Additional Equipment:   Intra-op Plan:   Post-operative Plan: Extubation in OR  Informed Consent: I have reviewed the patients History and Physical, chart, labs and discussed the procedure including the risks, benefits and alternatives for the proposed anesthesia with the patient or authorized representative who has indicated his/her understanding and acceptance.     Dental advisory given  Plan Discussed with: CRNA, Anesthesiologist and Surgeon  Anesthesia Plan Comments:         Anesthesia Quick Evaluation

## 2021-06-23 NOTE — Brief Op Note (Signed)
06/23/2021  2:33 PM  PATIENT:  Ebony Warren  45 y.o. female  PRE-OPERATIVE DIAGNOSIS:  macromastia  POST-OPERATIVE DIAGNOSIS:   Macromastia  PROCEDURE:  Procedure(s): MAMMARY REDUCTION  (BREAST) (Bilateral)  SURGEON:  Surgeon(s) and Role:    * Markeesha Char, Wendy Poet, MD - Primary  PHYSICIAN ASSISTANT: Enedina Finner, RNFA  ASSISTANTS: none   ANESTHESIA:   general  EBL:  2 mL   BLOOD ADMINISTERED:none  DRAINS: Penrose drain in the bilateral chest    LOCAL MEDICATIONS USED:  MARCAINE     SPECIMEN:  Source of Specimen:  bilateral breast tissue  DISPOSITION OF SPECIMEN:  PATHOLOGY  COUNTS:  YES  TOURNIQUET:  * No tourniquets in log *  DICTATION: .Dragon Dictation  PLAN OF CARE: Discharge to home after PACU  PATIENT DISPOSITION:  PACU - hemodynamically stable.   Delay start of Pharmacological VTE agent (>24hrs) due to surgical blood loss or risk of bleeding: not applicable

## 2021-06-23 NOTE — H&P (Signed)
Subjective [] Expand by Default   Patient ID: , female    DOB: May 23, 1976, 45 y.o.   MRN: [id number]      Chief Complaint  Patient presents with   Pre-op Exam          ICD-10-CM    1. Macromastia N62    2. Back pain of thoracolumbar region M54.50      M54.6    3. Postural kyphosis, thoracic region M40.04          History of Present Illness: Ebony Warren is a 45 y.o.  female  with a history of macromastia.  She presents for preoperative evaluation for upcoming procedure, Bilateral Breast Reduction via amputation technique, scheduled for 05/12/2021 with Dr.  07/12/2021   Patient reports no major complications with anesthesia.  She does report that she had some itching previously after anesthesia. No history of DVT/PE.  No family history of DVT/PE.  No family or personal history of bleeding or clotting disorders.  Patient is not currently taking any blood thinners.  No history of CVA/MI.   STN on the right is 50 cm and on the left is 47 cm. Estimated excess breast tissue to be removed at time of surgery: 1500 g on the left and 1500 g on the right.   PMH Significant for: Multiple sclerosis, GERD, obesity She is mostly wheelchair bound due to her MS.  She reports that she can walk short amount of distances.   She reports that overall she is feeling well, no recent changes in her health.  No infectious symptoms.  No fevers, chills, nausea, vomiting, chest pain, shortness of breath.     Past Medical History: Allergies:      Allergies  Allergen Reactions   Vicodin Hp [Hydrocodone-Acetaminophen] Itching   Morphine Itching   Orange Fruit [Citrus]        Acidic foods Makes her itchy and her face "feels like its on fire", have rash.   Oxycodone Itching      rash   Percocet [Oxycodone-Acetaminophen]        Stomach cramps     Vicodin [Hydrocodone-Acetaminophen]        Stomach cramps     Other Rash      Narcotics. Spicy foods (cause seizures)       Current Medications:   Current Outpatient Medications:   ARIPiprazole (ABILIFY) 5 MG tablet, Take 5 mg by mouth daily., Disp: , Rfl:   baclofen (LIORESAL) 10 MG tablet, TAKE 2 TABLETS(20 MG) BY MOUTH THREE TIMES DAILY, Disp: 180 tablet, Rfl: 11   benzonatate (TESSALON PERLES) 100 MG capsule, Take 1 capsule (100 mg total) by mouth 3 (three) times daily as needed for cough., Disp: 30 capsule, Rfl: 1   cholecalciferol (VITAMIN D) 1000 UNITS tablet, Take 1,000 Units by mouth daily. Take one a day for 6-12 months and we will recheck your levels when you come back in the office in 6 months, Disp: , Rfl:   diphenhydrAMINE (BENADRYL) 25 mg capsule, Take 25 mg by mouth every 6 (six) hours as needed. allergies, Disp: , Rfl:   DULoxetine (CYMBALTA) 30 MG capsule, Take 1 capsule (30 mg total) by mouth daily., Disp: 30 capsule, Rfl: 2   gabapentin (NEURONTIN) 300 MG capsule, TAKE 2 CAPSULES(600 MG) BY MOUTH THREE TIMES DAILY (Patient taking differently: 300 mg. TAKE 3 CAPSULES BY MOUTH THREE TIMES DAILY.), Disp: 180 capsule, Rfl: 12   LORazepam (ATIVAN) 1 MG tablet, Take 1 mg  by mouth daily., Disp: , Rfl:   Multiple Vitamins-Minerals (MULTIVITAMIN ADULT PO), Take 1 tablet by mouth daily., Disp: , Rfl:   ocrelizumab (OCREVUS) 300 MG/10ML injection, Infuse 600 mg IV Every 6 months, Disp: 20 mL, Rfl: 1   ondansetron (ZOFRAN) 4 MG tablet, Take 1 tablet (4 mg total) by mouth every 8 (eight) hours as needed for nausea or vomiting., Disp: 20 tablet, Rfl: 0   pantoprazole (PROTONIX) 40 MG tablet, Take 1 tablet (40 mg total) by mouth daily., Disp: 30 tablet, Rfl: 3   Semaglutide,0.25 or 0.5MG /DOS, 2 MG/1.5ML SOPN, Inject 0.5 mg into the skin once a week., Disp: 1.5 mL, Rfl: 0   tiZANidine (ZANAFLEX) 4 MG tablet, TAKE 1 TABLET(4 MG) BY MOUTH THREE TIMES DAILY, Disp: 90 tablet, Rfl: 12   traMADol (ULTRAM) 50 MG tablet, Take 1 tablet (50 mg total) by mouth every 6 (six) hours as needed for up to 5 days for severe  pain., Disp: 20 tablet, Rfl: 0   acetaminophen (TYLENOL) 325 MG tablet, Take 650 mg by mouth daily as needed for mild pain., Disp: , Rfl:    Past Medical Problems:     Past Medical History:  Diagnosis Date   Anxiety and depression     Bradycardia     Encounter to establish care 01/22/2021   Multiple sclerosis (HCC)     Screening mammogram, encounter for 01/22/2021   Seizures (HCC)      told that they are not from the brain but stress related      Past Surgical History:      Past Surgical History:  Procedure Laterality Date   CHOLECYSTECTOMY       KNEE SURGERY          Social History: Social History         Socioeconomic History   Marital status: Single      Spouse name: Not on file   Number of children: 1   Years of education: College   Highest education level: Not on file  Occupational History      Employer: OTHER      Comment: disabled  Tobacco Use   Smoking status: Former Smoker      Packs/day: 0.35      Years: 3.00      Pack years: 1.05      Types: Cigarettes      Quit date: 12/07/2006      Years since quitting: 14.3   Smokeless tobacco: Never Used  Substance and Sexual Activity   Alcohol use: No      Alcohol/week: 0.0 standard drinks      Comment: quit: 2013 (socially)   Drug use: Yes      Types: Marijuana      Comment: quit: 2001   Sexual activity: Not on file  Other Topics Concern   Not on file  Social History Narrative    Patient lives at home with family.    Caffeine Use: drinks a cup of hot tea with tecfidera in the am    Patient is right handed.    Patient has a college education    Social Determinants of Manufacturing engineer Strain: Not on file  Food Insecurity: Not on file  Transportation Needs: Not on file  Physical Activity: Not on file  Stress: Not on file  Social Connections: Not on file  Intimate Partner Violence: Not on file      Family History:      Family  History  Problem Relation Age of Onset   Hypertension Mother      Atrial fibrillation Mother     Hypertension Father     Atrial fibrillation Maternal Grandmother        Review of Systems: Review of Systems Constitutional: Negative.   Respiratory: Negative.   Cardiovascular: Negative.   Gastrointestinal: Negative.   Musculoskeletal: Negative.   Neurological: Positive for weakness (Chronic).      Physical Exam: Vital Signs BP 116/84 (BP Location: Right Arm, Patient Position: Sitting, Cuff Size: Large)   Pulse 74   Ht 5\' 8"  (1.727 m)   Wt 284 lb (128.8 kg)   SpO2 98%   BMI 43.18 kg/m    Physical Exam  Constitutional:      General: Not in acute distress.  In wheelchair    Appearance: Normal appearance. Not ill-appearing.  HENT:    Head: Normocephalic and atraumatic.  Eyes:    Pupils: Pupils are equal, round Neck:    Musculoskeletal: Normal range of motion.  Cardiovascular:    Rate and Rhythm: Normal rate    Pulses: Normal pulses.  Pulmonary:    Effort: Pulmonary effort is normal. No respiratory distress.  Musculoskeletal: Normal range of motion. Skin:    General: Skin is warm and dry.     Findings: No erythema or rash.  Neurological:    General: No focal deficit present.     Mental Status: Alert and oriented to person, place, and time. Mental status is at baseline.     Motor: No weakness.  Psychiatric:        Mood and Affect: Mood normal.        Behavior: Behavior normal.     Assessment/Plan: The patient is scheduled for bilateral breast reduction via amputation technique with Dr. Arita MissPace.  Risks, benefits, and alternatives of procedure discussed, questions answered and consent obtained.     Smoking Status: Non-smoker; Counseling Given?  N/A Last Mammogram: Patient is scheduled for mammogram on 04/24/2021; Results: N/A   Caprini Score: 8, high; Risk Factors include: Age, BMI greater than 40, wheelchair-bound and unable to walk greater than 30 feet, current swollen legs, and length of planned surgery. Recommendation for  mechanical and pharmacological prophylaxis for 7 to 10 days postoperatively. Encourage early ambulation.     Pictures obtained: 02/27/2021   Post-op Rx sent to pharmacy: Norco, Zofran   Patient was provided with the breast reduction and General Surgical Risk consent document and Pain Medication Agreement prior to their appointment.  They had adequate time to read through the risk consent documents and Pain Medication Agreement. We also discussed them in person together during this preop appointment. All of their questions were answered to their satisfaction.  Recommended calling if they have any further questions.  Risk consent form and Pain Medication Agreement to be scanned into patient's chart.   The risk that can be encountered with breast reduction via amputation technique were discussed and include the following but not limited to these:  Breast asymmetry, fluid accumulation, firmness of the breast, inability to breast feed, loss of nipple or areola, skin loss, no nipple sensation, fat necrosis of the breast tissue, bleeding, infection, healing delay.  There are risks of anesthesia, changes to skin sensation and injury to nerves or blood vessels.  The muscle can be temporarily or permanently injured.  You may have an allergic reaction to tape, suture, glue, blood products which can result in skin discoloration, swelling, pain, skin lesions, poor healing.  Any of  these can lead to the need for revisonal surgery or stage procedures.  A reduction has potential to interfere with diagnostic procedures.  Nipple or breast piercing can increase risks of infection.  This procedure is best done when the breast is fully developed.  Changes in the breast will continue to occur over time.  Pregnancy can alter the outcomes of previous breast reduction surgery, weight gain and weigh loss can also effect the long term appearance.   Patient is aware that with amputation technique bilateral breast reduction she could  have complete loss of the nipple areolar graft which would result in a wound which would require ongoing wound care.  She is also aware of irregular pigmentation of the grafted nipple areolar complex and insensate nipples.  She is also aware that due to her BMI she is at an increased risk of postoperative wound complications.  She is aware that further surgical invention may be necessary if she develops large wounds or has any complications that warrant additional surgery   Medical clearance received from patient's PCP for scheduled procedure

## 2021-06-24 ENCOUNTER — Encounter (HOSPITAL_COMMUNITY): Payer: Self-pay | Admitting: Plastic Surgery

## 2021-06-24 DIAGNOSIS — N62 Hypertrophy of breast: Secondary | ICD-10-CM | POA: Diagnosis not present

## 2021-06-24 MED ORDER — TRAMADOL HCL 50 MG PO TABS: 50.0000 mg | ORAL_TABLET | Freq: Four times a day (QID) | ORAL | 0 refills | Status: DC | PRN

## 2021-06-24 MED ORDER — ONDANSETRON HCL 4 MG PO TABS: 4.0000 mg | ORAL_TABLET | Freq: Three times a day (TID) | ORAL | 0 refills | Status: DC | PRN

## 2021-06-24 NOTE — Progress Notes (Signed)
Ebony Warren to be D/C'd Home per MD order.  Discussed with the patient and all questions fully answered.  VSS, Skin clean, dry and intact without evidence of skin break down, no evidence of skin tears noted. IV catheter discontinued intact. Site without signs and symptoms of complications. Dressing and pressure applied.  An After Visit Summary was printed and given to the patient. Patient received prescription.  D/c education completed with patient/family including follow up instructions, medication list, d/c activities limitations if indicated, with other d/c instructions as indicated by MD - patient able to verbalize understanding, all questions fully answered.   Patient instructed to return to ED, call 911, or call MD for any changes in condition.   Patient escorted via WC, and D/C home via private auto.  Selena Batten Shaelin Lalley 06/24/2021 2:56 PM

## 2021-06-24 NOTE — Progress Notes (Signed)
Patient's missing belongings located. Called RN Matt on 5w to inform that belongings are at [facility] nurses station and may be picked up.

## 2021-06-24 NOTE — Discharge Summary (Signed)
Physician Discharge Summary  Patient ID: Ebony Warren MRN: [id number] DOB/AGE: 12-18-1975 45 y.o.  Admit date: 06/23/2021 Discharge date: 06/24/2021  Admission Diagnoses:  Discharge Diagnoses:  Active Problems:   Macromastia   Discharged Condition: good  Hospital Course: Tolerated procedure well.  Consults: None  Significant Diagnostic Studies: None  Treatments: surgery: Breast reduction  Discharge Exam: Blood pressure 90/63, pulse 83, temperature 98.7 F (37.1 C), temperature source Oral, resp. rate 17, height 5\' 8"  (1.727 m), weight 127 kg, last menstrual period 05/21/2021, SpO2 98 %. General appearance: alert  Disposition: Discharge disposition: 01-Home or Self Care       Discharge Instructions     Diet - low sodium heart healthy   Complete by: As directed    Increase activity slowly   Complete by: As directed       Allergies as of 06/24/2021       Reactions   Hydrocodone Itching   Stomach cramps   Morphine Itching   Orange Fruit [citrus]    Acidic foods Makes her itchy and her face "feels like its on fire", have rash.   Oxycodone Itching   rash   Other Rash   Narcotics. Spicy foods (cause seizures)        Medication List     TAKE these medications    acetaminophen 325 MG tablet Commonly known as: TYLENOL Take 650 mg by mouth every 6 (six) hours as needed for mild pain.   ARIPiprazole 5 MG tablet Commonly known as: ABILIFY Take 5 mg by mouth daily.   benzonatate 100 MG capsule Commonly known as: Tessalon Perles Take 1 capsule (100 mg total) by mouth 3 (three) times daily as needed for cough.   cholecalciferol 25 MCG (1000 UNIT) tablet Commonly known as: VITAMIN D Take 1,000 Units by mouth daily.   diphenhydrAMINE 25 mg capsule Commonly known as: BENADRYL Take 25 mg by mouth every 6 (six) hours as needed for allergies.   DULoxetine 30 MG capsule Commonly known as: Cymbalta Take 1 capsule (30 mg total) by mouth  daily.   enoxaparin 40 MG/0.4ML injection Commonly known as: LOVENOX Inject 0.4 mLs (40 mg total) into the skin daily for 7 days. Start 24 hours after surgery.   gabapentin 300 MG capsule Commonly known as: NEURONTIN TAKE 2 CAPSULES(600 MG) BY MOUTH THREE TIMES DAILY   LORazepam 1 MG tablet Commonly known as: ATIVAN Take 1 mg by mouth daily.   MULTIVITAMIN ADULT PO Take 1 tablet by mouth daily.   ocrelizumab 300 MG/10ML injection Commonly known as: Ocrevus Infuse 600 mg IV Every 6 months   ondansetron 4 MG tablet Commonly known as: Zofran Take 1 tablet (4 mg total) by mouth every 8 (eight) hours as needed for nausea or vomiting. What changed: Another medication with the same name was added. Make sure you understand how and when to take each.   ondansetron 4 MG tablet Commonly known as: Zofran Take 1 tablet (4 mg total) by mouth every 8 (eight) hours as needed for nausea or vomiting. What changed: You were already taking a medication with the same name, and this prescription was added. Make sure you understand how and when to take each.   Ozempic (1 MG/DOSE) 4 MG/3ML Sopn Generic drug: Semaglutide (1 MG/DOSE) Inject 1 mg into the skin once a week.   pantoprazole 40 MG tablet Commonly known as: PROTONIX TAKE 1 TABLET(40 MG) BY MOUTH DAILY   spironolactone 25 MG tablet Commonly known as: ALDACTONE Take 25  mg by mouth daily.   tiZANidine 4 MG tablet Commonly known as: ZANAFLEX TAKE 1 TABLET(4 MG) BY MOUTH THREE TIMES DAILY   traMADol 50 MG tablet Commonly known as: Ultram Take 1 tablet (50 mg total) by mouth every 6 (six) hours as needed.       ASK your doctor about these medications    baclofen 10 MG tablet Commonly known as: LIORESAL TAKE 2 TABLETS(20 MG) BY MOUTH THREE TIMES DAILY         Signed: Allena Napoleon 06/24/2021, 5:03 PM

## 2021-06-25 LAB — SURGICAL PATHOLOGY

## 2021-07-01 ENCOUNTER — Telehealth: Payer: Self-pay

## 2021-07-01 NOTE — Telephone Encounter (Signed)
Requesting hospital bed. Please call pt back.

## 2021-07-02 ENCOUNTER — Other Ambulatory Visit: Payer: Self-pay

## 2021-07-02 ENCOUNTER — Encounter: Payer: Medicare Other | Admitting: Plastic Surgery

## 2021-07-02 ENCOUNTER — Ambulatory Visit (INDEPENDENT_AMBULATORY_CARE_PROVIDER_SITE_OTHER): Payer: Medicare Other

## 2021-07-02 VITALS — BP 90/63 | Temp 98.4°F | Ht 68.0 in | Wt 271.0 lb

## 2021-07-02 DIAGNOSIS — N62 Hypertrophy of breast: Secondary | ICD-10-CM

## 2021-07-02 NOTE — Progress Notes (Signed)
Patient in office

## 2021-07-02 NOTE — Telephone Encounter (Signed)
I returned phone call to patient about a Hospital. Patient has never had one before.The patient is renting one from a company paying out of [employer]ocket, but cannot afford to do this much longer. I have made a tele-helath  visit for patient on Thursday July 28@2 :15 with DR.Katsadouros.I asked patient about [employer] she said that she has a company that she is getting them from and not paying out of pocket for them [place], [place] C7/27/20221:57 PM

## 2021-07-02 NOTE — Progress Notes (Signed)
Patient here today with her mother & her aide for f/u S/P bilateral breast reduction 06/23/21 by Dr. Arita Miss. She indicates that she is doing well- she is having minimal pain- controlled by OTC pain meds.  only changed the dressing once/daily- and reports moderate drainage - bilaterally right > left.  Dressings removed in exam room- drainage appears to be serosanguinous & minimal amount. She denies any chills/fever no n/v & no other signs of complications. There is a resolving "blister" medial aspect of left breast adjacent to incision - I removed the remaining steri-strips. The left penrose drain is still sutured to incision site- but both ends were outside of the body.  Her mother & aide were unaware that the drain had become dislodged. There was no sign of asymmetry d/t swelling or fluid accumulation bilaterally.  I was unable to express any measurable amount of fluid from either site, so I removed both penrose drains.   I also consulted & reviewed the photos with Dr. Arita Miss & he approved for me to remove the bolster dressings from both NAC- I did so & both nipple/skin grafts appear to be intact & viable.  I dd review the healing stages that the nipple/skin graft may experience such as sloughing/loss of pigment & patient understands the process going forward. She did remove the ace wrap after 3 days & is wearing a sports bra. The patient states that she is very pleased with the results & indicated that she has decreased heaviness/strain to her breasts/neck & shoulders. I instructed her to continue to wear the sports bra & can apply vaseline to the incisions & surrounding skin. They will call for any concerns, otherwise f/u as scheduled

## 2021-07-03 ENCOUNTER — Ambulatory Visit (INDEPENDENT_AMBULATORY_CARE_PROVIDER_SITE_OTHER): Payer: Medicare Other | Admitting: Student

## 2021-07-03 ENCOUNTER — Other Ambulatory Visit: Payer: Self-pay

## 2021-07-03 VITALS — Ht 68.0 in | Wt 270.0 lb

## 2021-07-03 DIAGNOSIS — G35 Multiple sclerosis: Secondary | ICD-10-CM | POA: Diagnosis not present

## 2021-07-03 NOTE — Patient Instructions (Signed)
Patient will use vaseline & continue sports bra. She will call for any changes or concerns.

## 2021-07-04 NOTE — Assessment & Plan Note (Addendum)
Assessment: Patient states she has history of relapsing remitting multiple sclerosis.  She notes that for the most part is well controlled on medications that she currently feels as though she is having an acute flare.  Discussed with her to urgently contact her neurologist concerning this matter.  Patient states that she has chronic pain secondary to her MS and that caused her difficulty with ambulation as well as ability to care for himself.  She has aids that asist her with incontinence as well and ambulating.  Because there are times where she is unable to ambulate safely the aids have to assist the patient with cleaning himself after urinating and passing her bowels.  She has been renting a hospital bed for the past week and believes this has led to improvement in her overall care and safety. Of note, patient states she recently had breast reduction surgery and since then she feels as though her ambulation has significantly worsened. I recommended she emergently contact the surgeons who performed the procedure.   As such, patient is requesting an order for a hospital bed which I believe is appropriate considering her MS and other chronic medical issues (hemiplegia and incontinence).  Plan: -DME order placed for hospital bed -recommended patient urgently reach out to neurologist for an acute flair -recommend patient contact surgeon emergently 2/2 difficulty with ambulation since procedure

## 2021-07-04 NOTE — Progress Notes (Signed)
   CC: Request for Hospital Bed - 2/2 MS  This is a telephone encounter between Ebony Warren and Ebony Warren on 07/04/2021 to discuss need for hospital bed. The visit was conducted with the patient located at home and Ebony Warren at [facility]. The patient's identity was confirmed using their DOB and current address. The patient has consented to being evaluated through a telephone encounter and understands the associated risks (an examination cannot be done and the patient may need to come in for an appointment) / benefits (allows the patient to remain at home, decreasing exposure to coronavirus). I personally spent 15 minutes on medical discussion.   HPI:  [name] is a 45 y.o. with PMH as below.   Please see A&P for assessment of the patient's acute and chronic medical conditions.   Past Medical History:  Diagnosis Date   Anxiety and depression    Arthritis    Bradycardia    Encounter to establish care 01/22/2021   Family history of adverse reaction to anesthesia    nausea   GERD (gastroesophageal reflux disease)    Headache    Multiple sclerosis (HCC)    Screening mammogram, encounter for 01/22/2021   Seizures (HCC)    told that they are not from the brain but stress related   Review of Systems:   Difficulty with incontinence and ambulating    Assessment & Plan:   See Encounters Tab for problem based charting.  Patient discussed with Dr. Lorriane Shire Internal Medicine Resident

## 2021-07-07 ENCOUNTER — Telehealth: Payer: Self-pay | Admitting: Diagnostic Neuroimaging

## 2021-07-07 ENCOUNTER — Telehealth: Payer: Self-pay

## 2021-07-07 NOTE — Telephone Encounter (Signed)
Valentino Saxon, RN; Drema Dallas; Kathyrn Sheriff; 1 other  Got it! Thanks!

## 2021-07-07 NOTE — Telephone Encounter (Signed)
CM sent to Colletta Maryland at [facility] for hospital bed. F2F was 7/28  WT 270 lbs HT 5'8"

## 2021-07-07 NOTE — Telephone Encounter (Signed)
Spoke with Dr. Marjory Lies verbally on this. Would prefer to wait 4 weeks post op from breast reduction due to delay in the healing process. Pt also on higher dosage of muscle relaxer's. Would like for pt to monitor sx for now and reassess closer to the 4 week post procedure appt.   Pt advised of this recommendation and verbalized understanding. She will call back closer to the 4 week mark to let us know how she is doing.

## 2021-07-07 NOTE — Telephone Encounter (Signed)
Pt is asking for a call to discuss that since her breast reduction surgery on 7-18 she has had difficulty walking, a lot of pain in her legs.  Pt declined scheduling an appointment but would like a call from RN to discuss.

## 2021-07-07 NOTE — Telephone Encounter (Signed)
I called the pt to obtain more information. Pt reports she had breast reduction on 06/23/2021 and since, her  legs have been weaker and increased muscle spasm in legs at night. Pt reports she has been in touch with her surgeons office and was advised to call her Primary Neurologist.   She reports the two muscle relaxer's ( Tizanidine/Baclofen) has not been helping and she has been taking them as rx'd.   Pt sts her sx feel similar to an MS exacerbation, and in the past has been prescribed prednisone taper. Pt was advised I would send message to Dr. Marjory Lies to see how he would like to proceed.   Pt was agreeable to this plan.

## 2021-07-07 NOTE — Telephone Encounter (Signed)
Requesting to speak with a nurse about getting hospital bed. Please call pt back.

## 2021-07-17 ENCOUNTER — Encounter: Payer: Self-pay | Admitting: *Deleted

## 2021-07-17 ENCOUNTER — Other Ambulatory Visit: Payer: Self-pay

## 2021-07-17 ENCOUNTER — Ambulatory Visit (INDEPENDENT_AMBULATORY_CARE_PROVIDER_SITE_OTHER): Payer: Medicare Other

## 2021-07-17 VITALS — BP 90/63 | Temp 98.3°F | Ht 68.0 in | Wt 271.0 lb

## 2021-07-17 DIAGNOSIS — N62 Hypertrophy of breast: Secondary | ICD-10-CM

## 2021-07-18 ENCOUNTER — Other Ambulatory Visit: Payer: Self-pay

## 2021-07-18 NOTE — Telephone Encounter (Signed)
Semaglutide, 1 MG/DOSE, (OZEMPIC, 1 MG/DOSE,) 4 MG/3ML SOPN, refill request @ [facility] [phone] - [place], [place] - [address]

## 2021-07-20 NOTE — Patient Instructions (Signed)
Patient will call for any concerns 

## 2021-07-20 NOTE — Progress Notes (Signed)
07/17/21 patient in office today for postop f/u. S/P bilateral breast reduction with free nipple graft on 06/23/21 by Dr. Arita Miss. C/O both breast intermittent "warm/tenderness" - which is relieved with OTC- Ibuprofen. She denies any chills/fever & no n/v. Both NAC have moderate "sloughing"- but there is pink tissue with multiple areas of "brown pigment" appearing.  There is no noted drainage or foul smelling exudate. She has been using vaseline/gauze & continues to wear her sports bra for compression. I informed her to take shower- with water & soap - from her posterior shoulders down over her breast- & do not scrub. Photos were taken & entered into chart. She understands to call for any changes or concerns- otherwise she has a f/u on 07/30/21 Patient agrees with the above plan of care.

## 2021-07-21 MED ORDER — OZEMPIC (1 MG/DOSE) 4 MG/3ML ~~LOC~~ SOPN: 1.0000 mg | PEN_INJECTOR | SUBCUTANEOUS | 1 refills | Status: DC

## 2021-07-30 ENCOUNTER — Other Ambulatory Visit: Payer: Self-pay

## 2021-07-30 ENCOUNTER — Ambulatory Visit (INDEPENDENT_AMBULATORY_CARE_PROVIDER_SITE_OTHER): Payer: Medicare Other

## 2021-07-30 VITALS — BP 90/63 | Temp 98.5°F | Ht 68.0 in | Wt 271.0 lb

## 2021-07-30 DIAGNOSIS — N62 Hypertrophy of breast: Secondary | ICD-10-CM

## 2021-07-30 NOTE — Patient Instructions (Signed)
Patient will call for any changes or concerns 

## 2021-07-30 NOTE — Progress Notes (Signed)
Patient in office today for postop f/u: She has continued to have small amount of drainage- that she describes as "yellow/thick".  She denies any fever/chills & no n/v. She has been using vaseline & gauze pads with her sports bra. She has minimal pain -left lateral/axilla area. On exam- the vertical & IMF incisions are intact- except for the T-junction on left breast has superficial opening - I was able to feel a suture remnant & remove it- which I think will help this area to heal going forward.    Both NAC areas appear to have viable/granulation tissue with some brown pigment on the surface.  The nipples have improved since last visit- I took photos & reviewed the progress with the pt & she understands that has been only 4 weeks since surgery & that the healing process with "free nipple grafting" can be lengthy.  She voices understanding of this & she is happy with the reduction- & the relief she has had in her shoulder/neck pain. I instructed her to wash the areas with soap/water- do not scrub - then apply vaseline/cover with dressing . She understands to call for any concerns or changes- otherwise - I will schedule her to see Dr. Arita Miss in 1 month She agrees with plan of care. I reviewed the above info with Dr. Arita Miss & he agrees with the progress/plan in place. At check-out  I spoke to patient's mom- & reviewed the photos & status- she agrees that there is progress in her healing.

## 2021-07-31 ENCOUNTER — Ambulatory Visit: Payer: Medicare Other

## 2021-07-31 ENCOUNTER — Encounter: Payer: Medicare Other | Admitting: Internal Medicine

## 2021-08-09 IMAGING — MG MM DIGITAL DIAGNOSTIC UNILAT*R* W/ TOMO W/ CAD
4 series · 4 of 12 positions shown · non-contrast
Comparison: Baseline screening mammogram on 04/24/2021

ACR Breast Density Category a: The breast tissue is almost entirely
fatty.

CLINICAL DATA: Patient returns after baseline screening study for
evaluation of possible RIGHT breast mass.

EXAM:
ULTRASOUND RIGHT BREAST LIMITED; DIGITAL DIAGNOSTIC UNILATERAL RIGHT
MAMMOGRAM WITH TOMOSYNTHESIS AND CAD
TECHNIQUE: Targeted ultrasound examination of the right breast was performed;
Right digital diagnostic mammography and breast tomosynthesis was
performed. The images were evaluated with computer-aided detection.

[R CC synth-2D (1 of 2)]
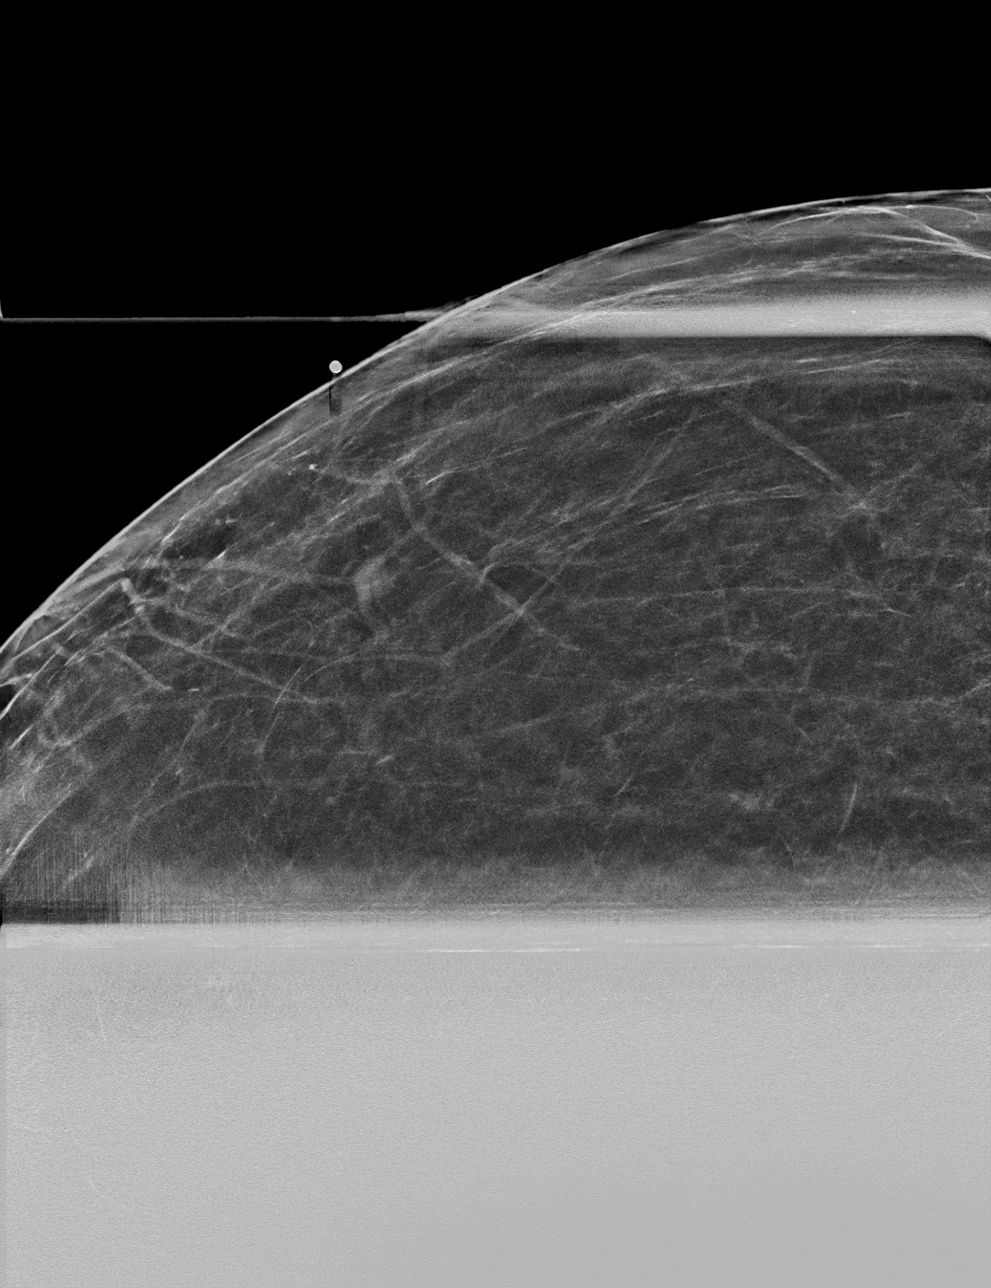

[R CC synth-2D (2 of 2)]
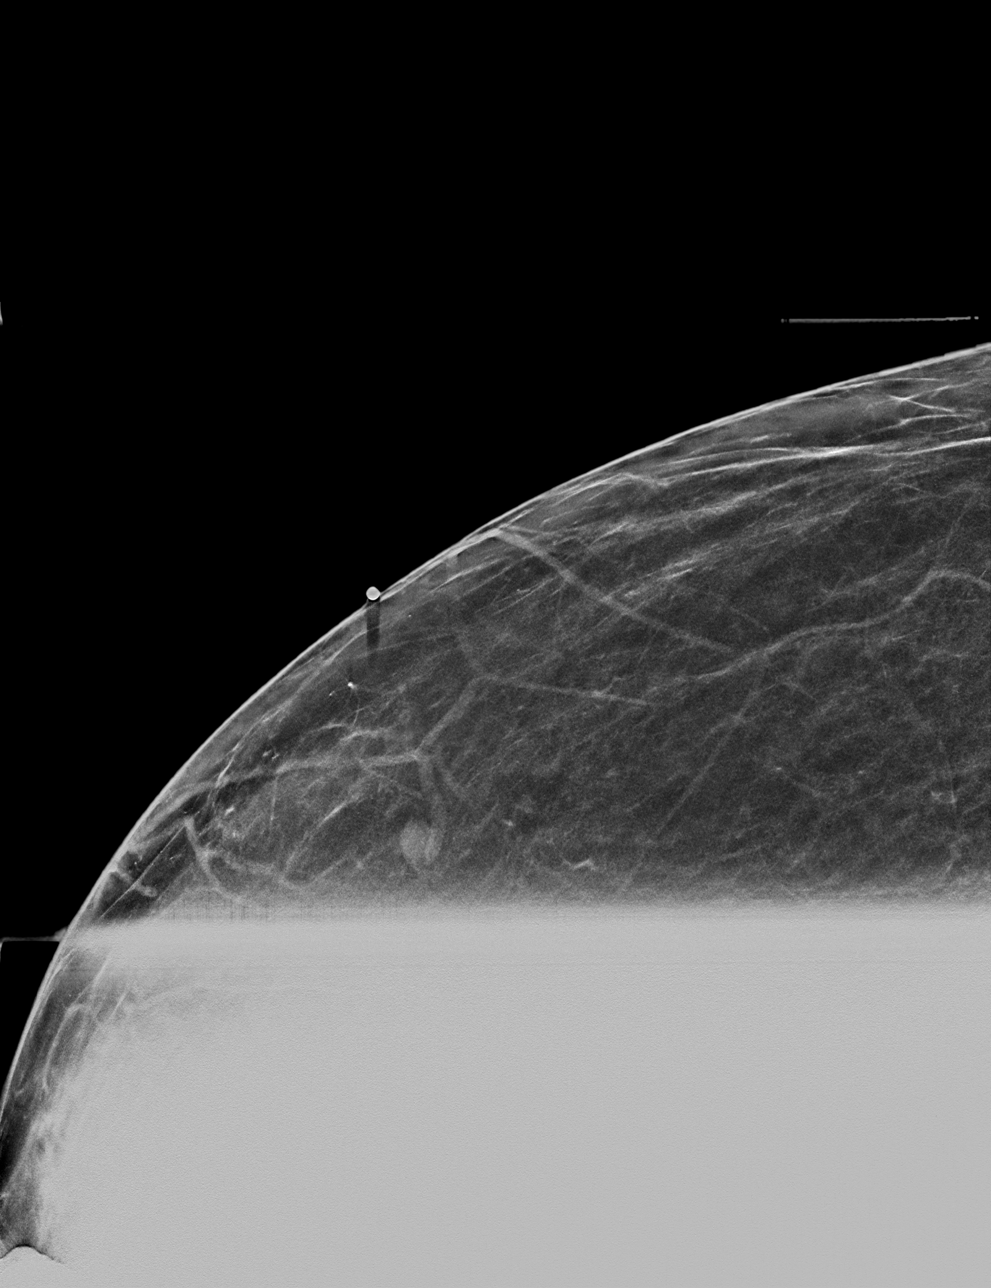

[R CC tomo (1 of 2) · tomo slice 28/55.0]
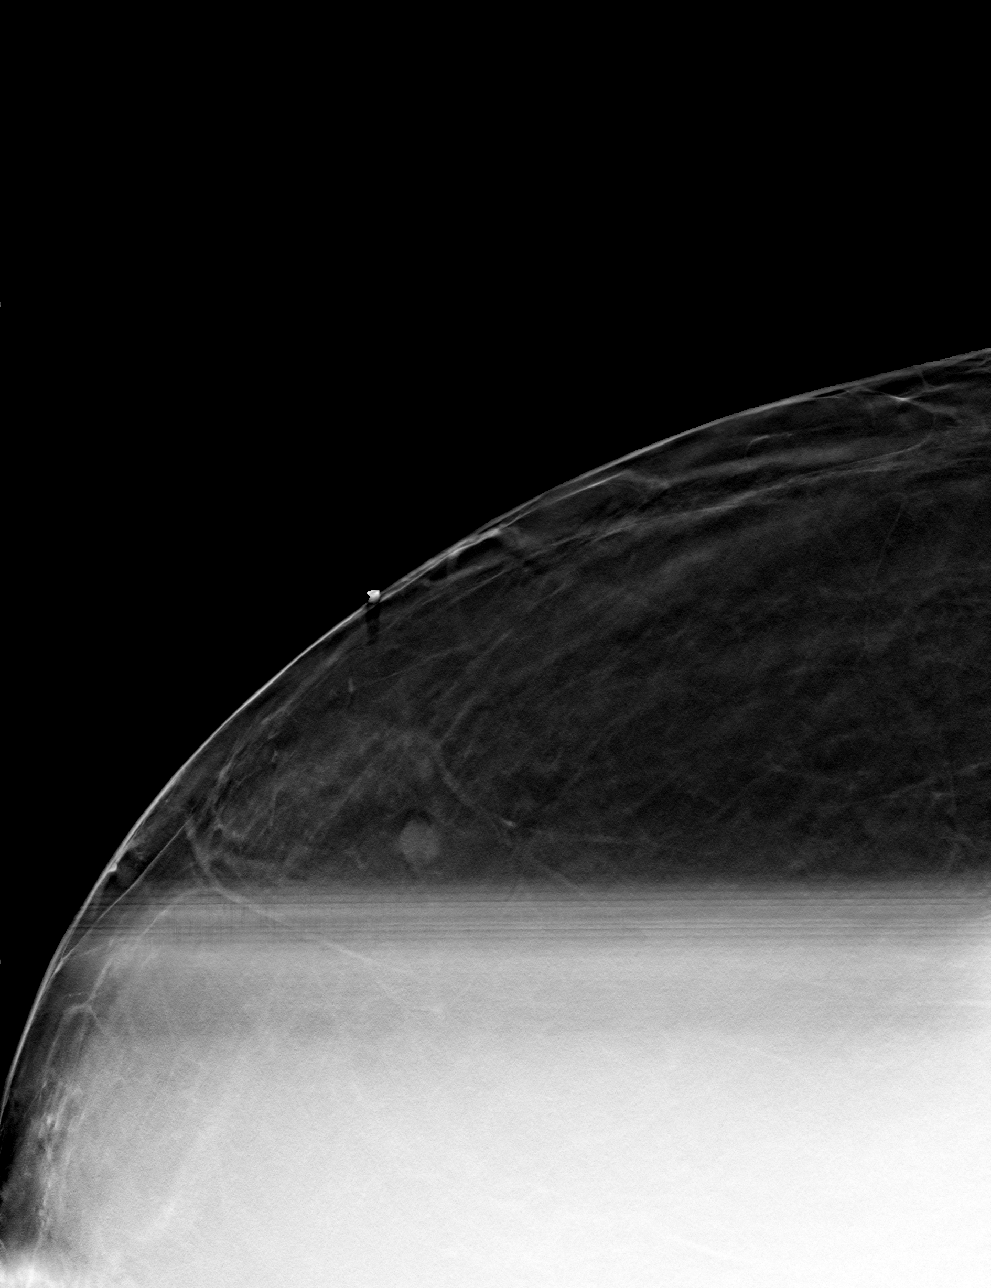

[R CC tomo (2 of 2) · tomo slice 33/64.0]
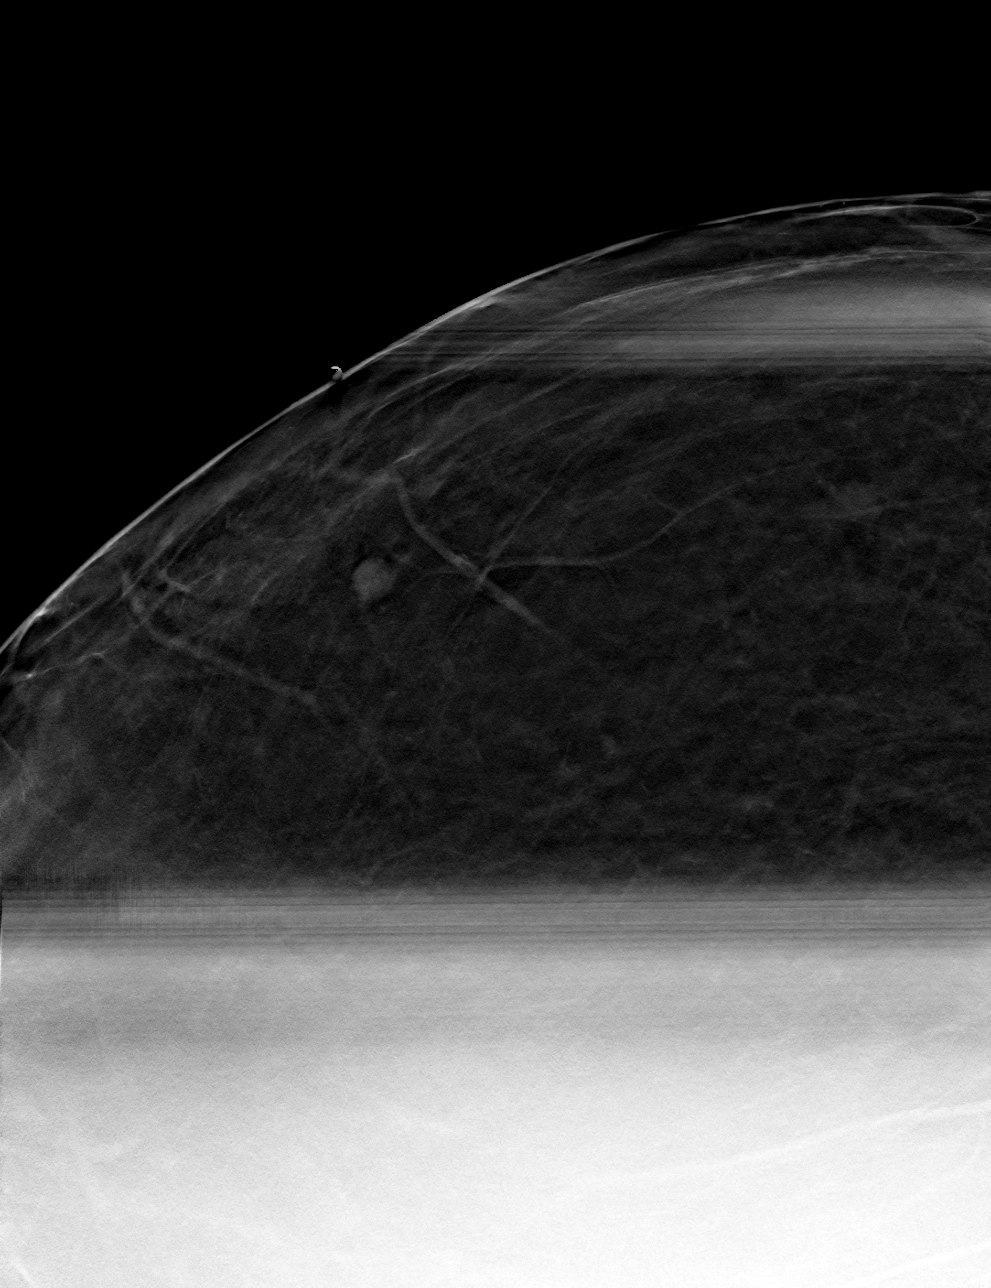

[4 of 12 positions shown; findings below may reference images not displayed]

FINDINGS: Targeted ultrasound is performed, showing an oval superficial mass
with indistinct and angular margins in the 9:30 o'clock location of
the RIGHT breast 12 centimeters from the nipple. No internal blood
flow identified by Doppler evaluation. Mass measures 0.7 x 0.4 x
centimeters.

Evaluation of the RIGHT axilla is negative for adenopathy.

A BB is placed over the sonographic abnormality and followup spot
tangential views of the RIGHT breast demonstrates an oval mass with
indistinct margins. There are no associated calcifications.
IMPRESSION: Indeterminate mass in the 9:30 o'clock location of the RIGHT breast
warranting tissue diagnosis. There is no ultrasound evidence for
adenopathy.

The patient is scheduled for breast reduction surgery on [DATE] by
Dr. Nomasibulele. She states that her surgeon will remove any abnormality
found on her mammogram and does not want to schedule
ultrasound-guided core biopsy today.

RECOMMENDATION:
Ultrasound-guided core biopsy of the RIGHT breast.

Follow-up with Dr. Nomasibulele.

I have discussed the findings and recommendations with the patient.
If applicable, a reminder letter will be sent to the patient
regarding the next appointment.

BI-RADS CATEGORY  4: Suspicious.

## 2021-08-09 IMAGING — US US BREAST*R* LIMITED INC AXILLA
1 series · 9 of 9 positions shown · non-contrast
Comparison: Baseline screening mammogram on 04/24/2021

ACR Breast Density Category a: The breast tissue is almost entirely
fatty.

CLINICAL DATA: Patient returns after baseline screening study for
evaluation of possible RIGHT breast mass.

EXAM:
ULTRASOUND RIGHT BREAST LIMITED; DIGITAL DIAGNOSTIC UNILATERAL RIGHT
MAMMOGRAM WITH TOMOSYNTHESIS AND CAD
TECHNIQUE: Targeted ultrasound examination of the right breast was performed;
Right digital diagnostic mammography and breast tomosynthesis was
performed. The images were evaluated with computer-aided detection.

[Series 1: us breast*right* limited inc axilla · 0.07mm/px · 9 acquisitions, 9 frames shown]
[im 1/9]
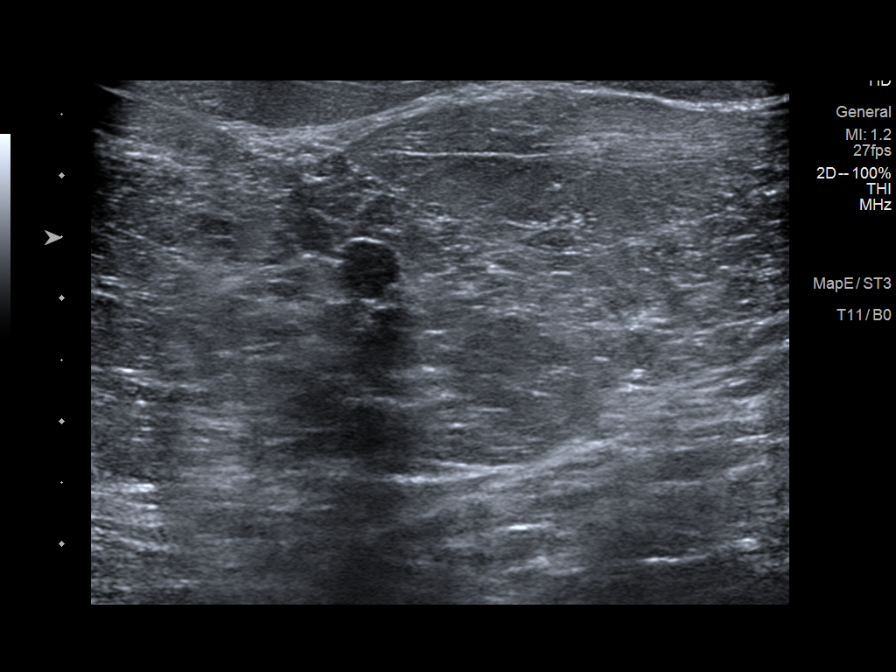
[im 2/9]
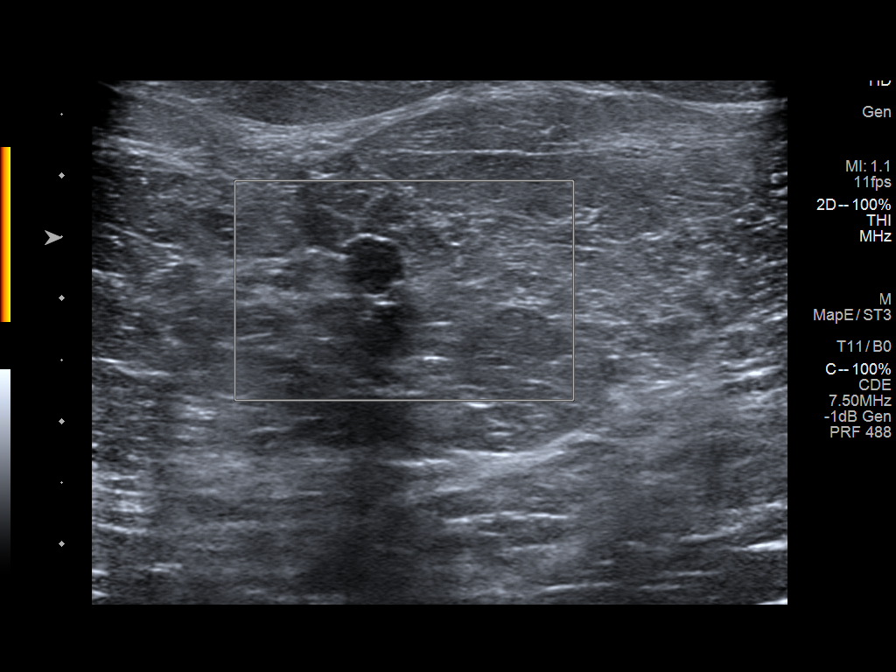
[im 3/9]
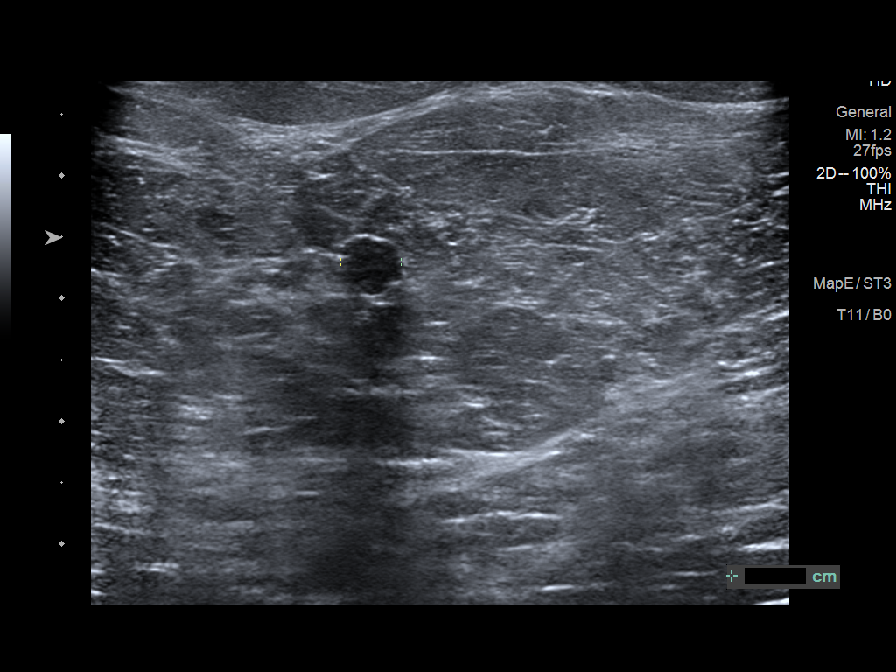
[im 4/9]
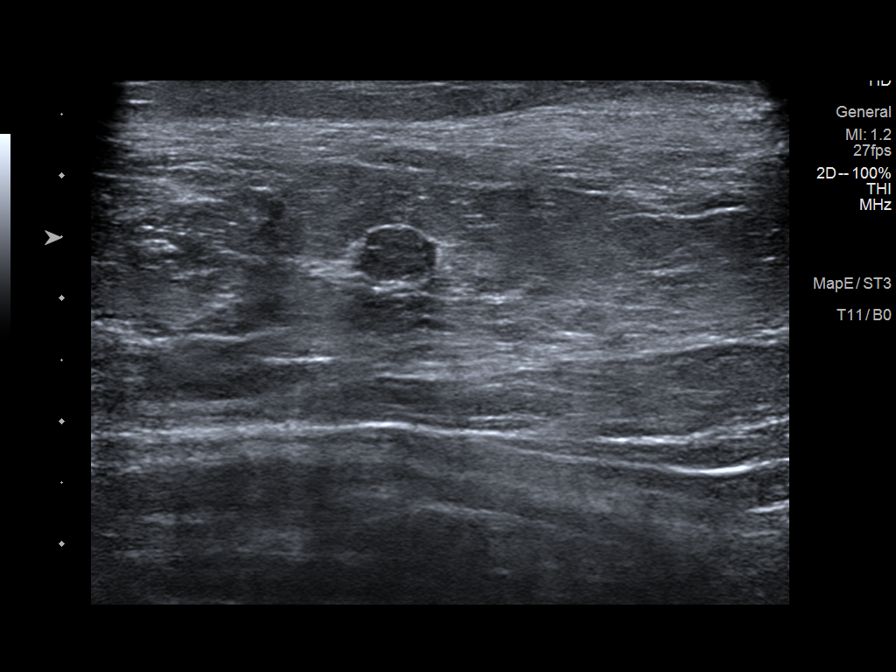
[im 5/9]
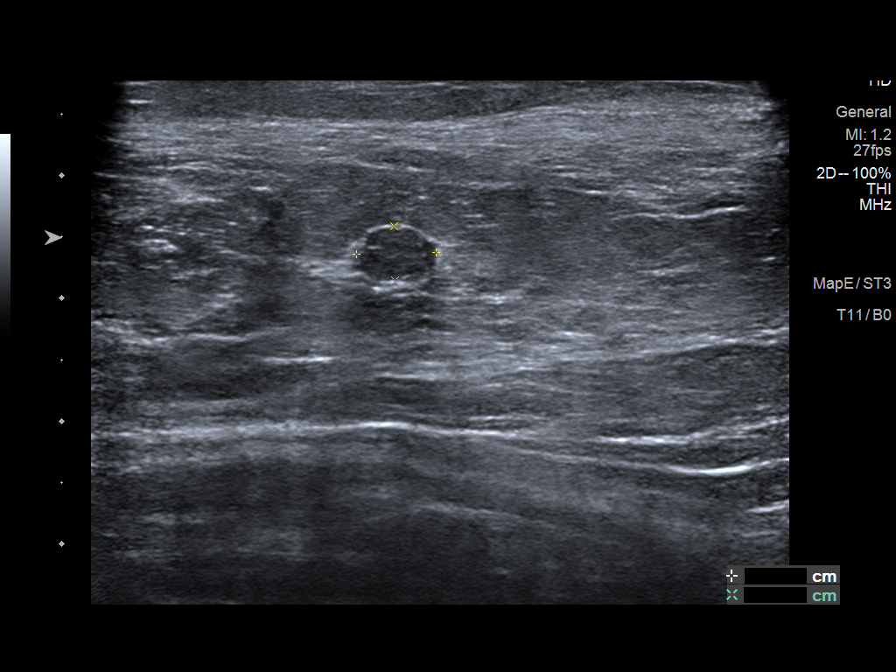
[im 6/9]
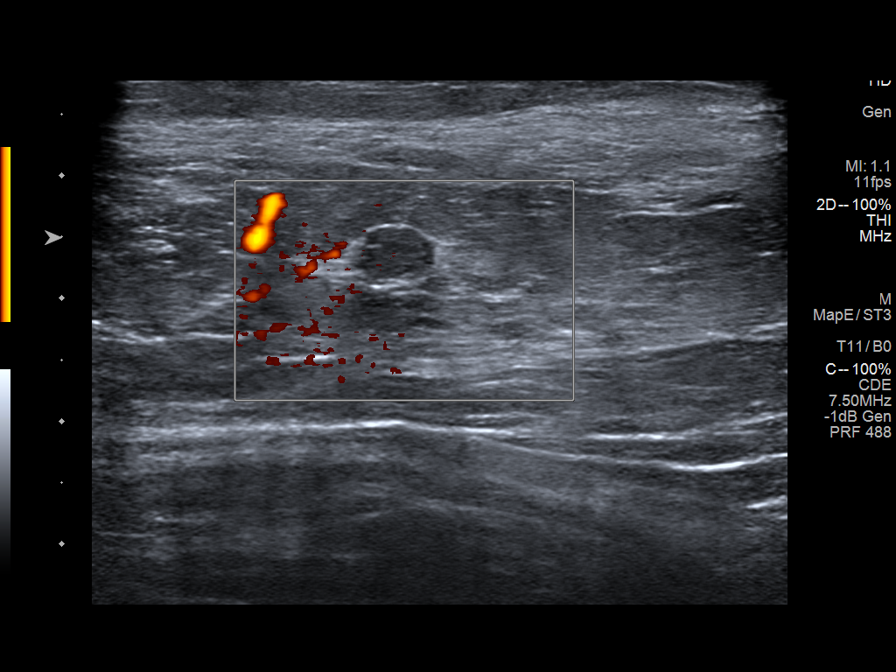
[im 7/9]
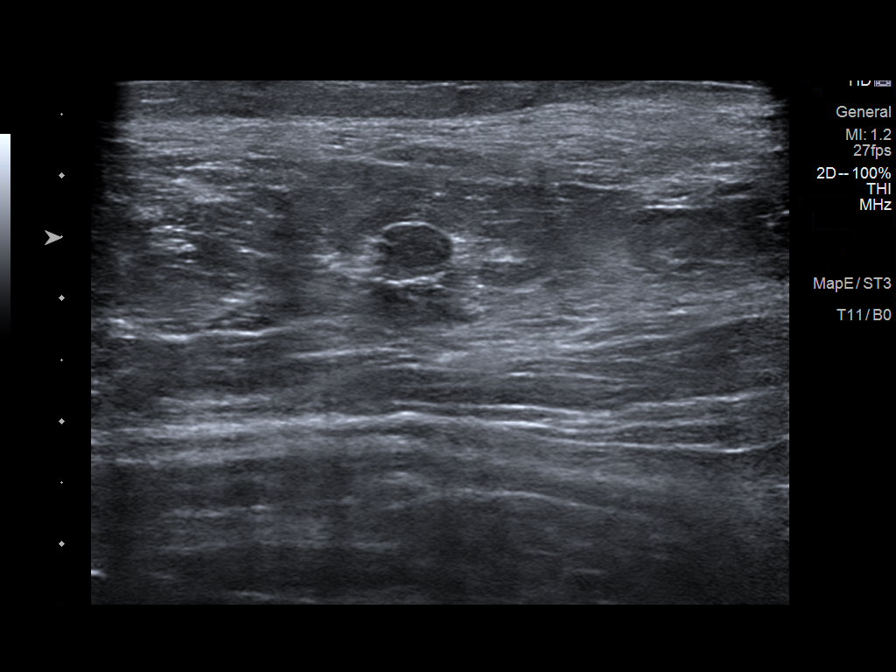
[im 8/9]
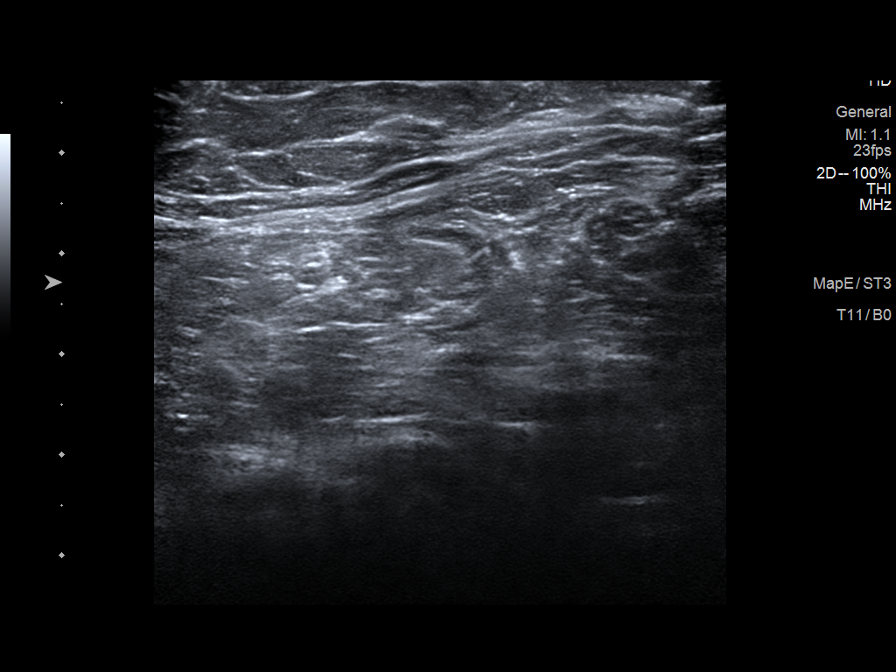
[im 9/9]
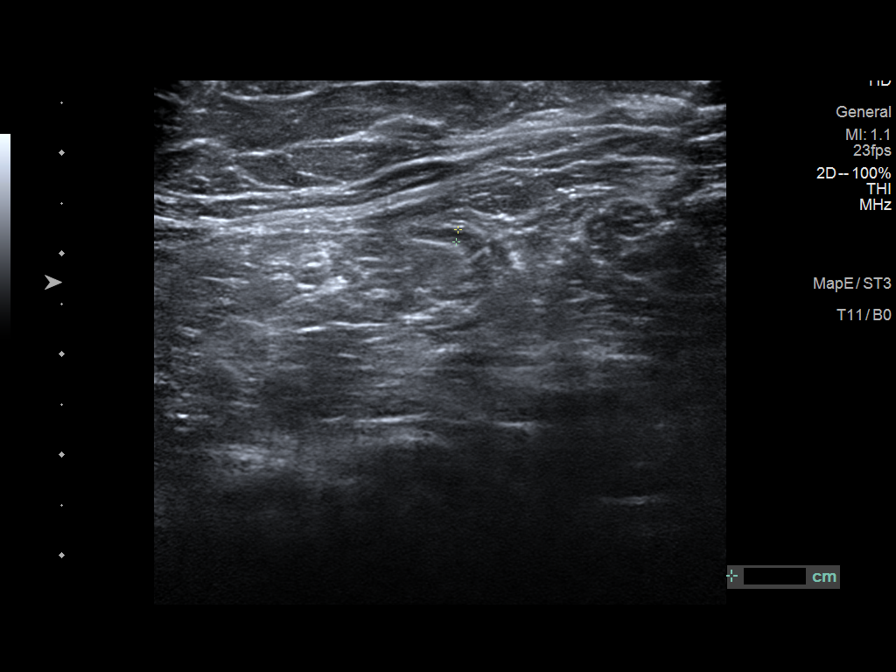

[9 of 9 positions shown; findings below may reference images not displayed]

FINDINGS: Targeted ultrasound is performed, showing an oval superficial mass
with indistinct and angular margins in the 9:30 o'clock location of
the RIGHT breast 12 centimeters from the nipple. No internal blood
flow identified by Doppler evaluation. Mass measures 0.7 x 0.4 x
centimeters.

Evaluation of the RIGHT axilla is negative for adenopathy.

A BB is placed over the sonographic abnormality and followup spot
tangential views of the RIGHT breast demonstrates an oval mass with
indistinct margins. There are no associated calcifications.
IMPRESSION: Indeterminate mass in the 9:30 o'clock location of the RIGHT breast
warranting tissue diagnosis. There is no ultrasound evidence for
adenopathy.

The patient is scheduled for breast reduction surgery on [DATE] by
Dr. Nomasibulele. She states that her surgeon will remove any abnormality
found on her mammogram and does not want to schedule
ultrasound-guided core biopsy today.

RECOMMENDATION:
Ultrasound-guided core biopsy of the RIGHT breast.

Follow-up with Dr. Nomasibulele.

I have discussed the findings and recommendations with the patient.
If applicable, a reminder letter will be sent to the patient
regarding the next appointment.

BI-RADS CATEGORY  4: Suspicious.

## 2021-08-12 ENCOUNTER — Other Ambulatory Visit (HOSPITAL_COMMUNITY)
Admission: RE | Admit: 2021-08-12 | Discharge: 2021-08-12 | Disposition: A | Discharge status: Home / Self Care | Payer: Medicare Other | Source: Ambulatory Visit | Attending: Internal Medicine | Admitting: Internal Medicine

## 2021-08-12 ENCOUNTER — Ambulatory Visit (INDEPENDENT_AMBULATORY_CARE_PROVIDER_SITE_OTHER): Payer: Medicare Other | Admitting: Internal Medicine

## 2021-08-12 VITALS — BP 130/80 | HR 80 | Temp 98.4°F | Ht 68.0 in | Wt 274.5 lb

## 2021-08-12 DIAGNOSIS — E611 Iron deficiency: Secondary | ICD-10-CM | POA: Diagnosis not present

## 2021-08-12 DIAGNOSIS — Z124 Encounter for screening for malignant neoplasm of cervix: Secondary | ICD-10-CM | POA: Diagnosis not present

## 2021-08-12 DIAGNOSIS — Z299 Encounter for prophylactic measures, unspecified: Secondary | ICD-10-CM | POA: Insufficient documentation

## 2021-08-12 DIAGNOSIS — Z6841 Body Mass Index (BMI) 40.0 and over, adult: Secondary | ICD-10-CM

## 2021-08-12 DIAGNOSIS — Z1151 Encounter for screening for human papillomavirus (HPV): Secondary | ICD-10-CM | POA: Insufficient documentation

## 2021-08-12 DIAGNOSIS — Z0000 Encounter for general adult medical examination without abnormal findings: Secondary | ICD-10-CM

## 2021-08-12 DIAGNOSIS — Z01419 Encounter for gynecological examination (general) (routine) without abnormal findings: Secondary | ICD-10-CM | POA: Diagnosis present

## 2021-08-12 DIAGNOSIS — G2581 Restless legs syndrome: Secondary | ICD-10-CM

## 2021-08-12 DIAGNOSIS — R252 Cramp and spasm: Secondary | ICD-10-CM

## 2021-08-12 DIAGNOSIS — Z1331 Encounter for screening for depression: Secondary | ICD-10-CM | POA: Insufficient documentation

## 2021-08-12 MED ORDER — OZEMPIC (0.25 OR 0.5 MG/DOSE) 2 MG/1.5ML ~~LOC~~ SOPN: 1.7000 mg | PEN_INJECTOR | SUBCUTANEOUS | 2 refills | Status: DC

## 2021-08-12 NOTE — Assessment & Plan Note (Signed)
Patient 286 lbs on 5/29, down to 274 today. She has been taking Ozempic and has been on the 1mg /week dose for ~3 weeks. Denies any side effects of this medicine and continues to experience success on her weight loss journey. Will increase to the 1.7mg /week dose for 3 weeks and can increase to 2.4mg  weekly thereafter, if she continues to tolerate the medication well.  Plan: - Semaglutide 1.7mg  injection once a week for 3 weeks; increase to 2.4mg  once a week thereafter - Follow up in ~3 months

## 2021-08-12 NOTE — Patient Instructions (Signed)
Thank you, [name] for allowing [name] to provide your care today. Today we discussed: Cramping: We are performing some labs for you today to evaluate the cause of your leg cramping. If your iron or potassium is low, we will call you to start a supplement for this. Obesity: I am glad to hear you are tolerating your Ozempic well and are experiencing success with this! We will increase your dose to 1.7mg  weekly and after 3 weeks, you can increase the dose to 2.4mg  weekly.  Healthcare maintenance: Performed pap smear today. Will call you if there are any abnormal results. If everything is normal, you do not need another pap smear for 5 years   I have ordered the following labs for you:   Lab Orders         BMP8+Anion Gap         Iron and IBC ([id number])         Ferritin      Tests ordered today:  Pap smear  Referrals ordered today:   Referral Orders  No referral(s) requested today     I have ordered the following medication/changed the following medications:   Stop the following medications: Medications Discontinued During This Encounter  Medication Reason   Semaglutide, 1 MG/DOSE, (OZEMPIC, 1 MG/DOSE,) 4 MG/3ML SOPN Dose change     Start the following medications: Meds ordered this encounter  Medications   Semaglutide,0.25 or 0.5MG /DOS, (OZEMPIC, 0.25 OR 0.5 MG/DOSE,) 2 MG/1.5ML SOPN    Sig: Inject 1.7 mg into the skin once a week for 12 doses. Inject 1.7mg  into the skin once a week for 3 weeks. If tolerating this dose, can increase to 2.4mg  once a week thereafter    Dispense:  5.1 mL    Refill:  2     Follow up: 3 months    Remember: to start the new dose of ozempic and please call us if you have any side effects  Should you have any questions or concerns please call the internal medicine clinic at [phone].     Elza Rafter, D.O. [facility]

## 2021-08-12 NOTE — Assessment & Plan Note (Addendum)
Patient states that she has been experiencing bilateral leg cramping and spasms that has been going on for the past few months, worse at night. She has a history of MS but has never experienced symptoms like this before. She has been taking her mother's potassium supplement for the past 2 weeks and has experienced an improvement in her symptoms and would like to start this, as well. Discussed that we would need to check lab work before doing so, and that we will also check an iron panel as iron deficiency can be a common cause of restless leg syndrome/leg cramping.  Plan: - BMP and iron studies performed today  Addendum: Ferritin level came back low, sent in iron supplement for patient to take daily. Would treat to iron level >75 to exclude iron deficiency as a cause of the patient's RLS

## 2021-08-12 NOTE — Progress Notes (Signed)
   CC: 3 month follow up/med refill/pap smear  HPI:  [name] is a 45 y.o. with MS, GERD, seizure disorder, anxiety, and depression who presents to the [facility] for a 3 month follow up visit. Please see problem-based list for further details, assessments, and plans.   Past Medical History:  Diagnosis Date   Anxiety and depression    Arthritis    Bradycardia    Encounter to establish care 01/22/2021   Family history of adverse reaction to anesthesia    nausea   GERD (gastroesophageal reflux disease)    Headache    Multiple sclerosis (HCC)    Screening mammogram, encounter for 01/22/2021   Seizures (HCC)    told that they are not from the brain but stress related   Review of Systems:  Review of Systems  Constitutional:  Negative for chills and fever.  HENT:  Negative for sinus pain and sore throat.   Eyes: Negative.   Respiratory:  Negative for shortness of breath and wheezing.   Cardiovascular:  Negative for chest pain and leg swelling.  Gastrointestinal:  Negative for diarrhea, nausea and vomiting.  Genitourinary:        Incontinence  Musculoskeletal:  Positive for myalgias.  Neurological:  Positive for sensory change and weakness. Negative for dizziness.  Psychiatric/Behavioral: Negative.      Physical Exam:  Vitals:   08/12/21 0954  BP: 130/80  Pulse: 80  Temp: 98.4 F (36.9 C)  TempSrc: Oral  SpO2: 99%  Weight: 274 lb 8 oz (124.5 kg)  Height: 5\' 8"  (1.727 m)   General: No acute distress. Head: Normocephalic. Atraumatic. CV: RRR. No murmurs, rubs, or gallops. No LE edema Pulmonary: Lungs CTAB. Normal effort. No wheezing or rales. Abdominal: Soft, nontender, nondistended. Normal bowel sounds. Extremities: Palpable radial and DP pulses. Wheelchair bound Neuro: A&Ox3. Sensory deficit + unable to move right lower extremity (hx of MS) Psych: Normal mood and affect   Assessment & Plan:   See Encounters Tab for problem based  charting.  Patient seen with Dr. 

## 2021-08-12 NOTE — Assessment & Plan Note (Signed)
Pap smear performed at this visit with HPV co-testing. If results are normal, patient does not need a pap smear for another 5 years.

## 2021-08-13 LAB — IRON AND TIBC
Iron Saturation: 37 % (ref 15–55)
Iron: 107 ug/dL (ref 27–159)
Total Iron Binding Capacity: 286 ug/dL (ref 250–450)
UIBC: 179 ug/dL (ref 131–425)

## 2021-08-13 LAB — BMP8+ANION GAP
Anion Gap: 15 mmol/L (ref 10.0–18.0)
BUN/Creatinine Ratio: 15 (ref 9–23)
BUN: 12 mg/dL (ref 6–24)
CO2: 20 mmol/L (ref 20–29)
Calcium: 9.3 mg/dL (ref 8.7–10.2)
Chloride: 104 mmol/L (ref 96–106)
Creatinine, Ser: 0.79 mg/dL (ref 0.57–1.00)
Glucose: 72 mg/dL (ref 65–99)
Potassium: 4.2 mmol/L (ref 3.5–5.2)
Sodium: 139 mmol/L (ref 134–144)
eGFR: 95 mL/min/{1.73_m2} (ref >59)

## 2021-08-13 LAB — FERRITIN: Ferritin: 57 ng/mL (ref 15–150)

## 2021-08-14 LAB — CYTOLOGY - PAP
Comment: NEGATIVE
Diagnosis: NEGATIVE
High risk HPV: NEGATIVE

## 2021-08-16 NOTE — Progress Notes (Signed)
Internal Medicine Clinic Attending  I saw and evaluated the patient.  I personally confirmed the key portions of the history and exam documented by Dr. Ned Card   and I reviewed pertinent patient test results.  The assessment, diagnosis, and plan were formulated together and I agree with the documentation in the resident's note. Would treat to Iron level >75 to exclude iron deficiency as cause of RLS.

## 2021-08-18 ENCOUNTER — Telehealth: Payer: Self-pay

## 2021-08-18 ENCOUNTER — Other Ambulatory Visit: Payer: Self-pay | Admitting: Internal Medicine

## 2021-08-18 MED ORDER — POLYETHYLENE GLYCOL 3350 17 G PO PACK: 17.0000 g | PACK | Freq: Every day | ORAL | 3 refills | Status: DC

## 2021-08-18 MED ORDER — FERROUS SULFATE 325 (65 FE) MG PO TABS
325.0000 mg | ORAL_TABLET | Freq: Every day | ORAL | 3 refills | Status: DC
Start: 2021-08-18 — End: 2021-09-23

## 2021-08-18 NOTE — Telephone Encounter (Signed)
ferrous sulfate 325 (65 FE) MG tablet, Is not covered by the insurance. Pt is using [facility] [phone] - [place], [place] - [address]

## 2021-08-19 ENCOUNTER — Telehealth: Payer: Self-pay | Admitting: Internal Medicine

## 2021-08-19 NOTE — Telephone Encounter (Signed)
Called [facility] - talked to Warson Woods who stated Ozempic is in process; waiting on their shipment which should arrive later today.  I called pt - informed of the above and refill rx was sent on 08/12/21. Stated when she called the pharmacy, she was told they had not received rx and she needed to call her doctor.

## 2021-08-19 NOTE — Telephone Encounter (Signed)
Talked to Kathlene November at [facility] who stated none of the iron pills are covered by insurance b/c it can be purchased OTC. Their cost is $11.98.

## 2021-08-19 NOTE — Telephone Encounter (Signed)
Refill Request-  Pt states her pharmacy did not get the following prescription called in.   Semaglutide,0.25 or 0.5MG /DOS, (OZEMPIC, 0.25 OR 0.5 MG/DOSE,) 2 MG/1.5ML [facility]   [facility] [phone] - [place], [place] - [address] (Ph: [phone])

## 2021-09-03 ENCOUNTER — Encounter: Payer: Self-pay | Admitting: Plastic Surgery

## 2021-09-03 ENCOUNTER — Other Ambulatory Visit: Payer: Self-pay

## 2021-09-03 ENCOUNTER — Ambulatory Visit (INDEPENDENT_AMBULATORY_CARE_PROVIDER_SITE_OTHER): Payer: Medicare Other | Admitting: Plastic Surgery

## 2021-09-03 DIAGNOSIS — N62 Hypertrophy of breast: Secondary | ICD-10-CM

## 2021-09-03 NOTE — Addendum Note (Signed)
Addended by: Verdie Shire on: 09/03/2021 01:33 PM   Modules accepted: Orders

## 2021-09-03 NOTE — Progress Notes (Signed)
Patient presents around 10 weeks postop from bilateral breast reduction with free nipple graft.  She is overall very happy.  She feels like she has a few spitting stitches.  On examination all of her incisions are nicely healed.  There was a few locations where there was some sutures that were spitting and these were removed.  Otherwise her shape size and symmetry looks good.  Nipple grafts are still a little bit pink in the center but would expect the pigmentation to even out over time.  I did explain that there would be some irregular pigmentation in the nipples and she was understanding of that.  At this point she feels healed enough that we do not need to schedule another follow-up appointment so we will plan to see her again on an as-needed basis.  All of her questions were answered.

## 2021-09-15 ENCOUNTER — Other Ambulatory Visit: Payer: Self-pay | Admitting: Internal Medicine

## 2021-09-23 ENCOUNTER — Telehealth: Payer: Self-pay | Admitting: Diagnostic Neuroimaging

## 2021-09-23 ENCOUNTER — Encounter: Payer: Self-pay | Admitting: Diagnostic Neuroimaging

## 2021-09-23 ENCOUNTER — Ambulatory Visit (INDEPENDENT_AMBULATORY_CARE_PROVIDER_SITE_OTHER): Payer: Medicare Other | Admitting: Diagnostic Neuroimaging

## 2021-09-23 VITALS — BP 122/72 | HR 79 | Ht 68.0 in | Wt 275.0 lb

## 2021-09-23 DIAGNOSIS — G35 Multiple sclerosis: Secondary | ICD-10-CM

## 2021-09-23 DIAGNOSIS — E559 Vitamin D deficiency, unspecified: Secondary | ICD-10-CM

## 2021-09-23 MED ORDER — GABAPENTIN 300 MG PO CAPS: 600.0000 mg | ORAL_CAPSULE | Freq: Three times a day (TID) | ORAL | 4 refills | Status: DC

## 2021-09-23 MED ORDER — BACLOFEN 20 MG PO TABS: 20.0000 mg | ORAL_TABLET | Freq: Three times a day (TID) | ORAL | 4 refills | Status: DC

## 2021-09-23 MED ORDER — TIZANIDINE HCL 4 MG PO TABS: ORAL_TABLET | ORAL | 12 refills | Status: DC

## 2021-09-23 NOTE — Progress Notes (Signed)
[facility]  PATIENT: Ebony Warren DOB: 1976/05/01  REFERRING CLINICIAN: Williams  HISTORY FROM: patient  REASON FOR VISIT: follow up   HISTORICAL  CHIEF COMPLAINT:  Chief Complaint  Patient presents with   Follow-up    RM 6 with Nurse Clissa here for 6 month f/u on MS. Pt reports, over the last 6 month weakness has been noted along with decreased balance. Reports 2 falls since last f/u no serious injuries. On Ocrevus- last infusion was in July next scheduled for January.     HISTORY OF PRESENT ILLNESS:   UPDATE (09/23/21, VRP): Since last visit, slightly more balance and gait issues. Has power chair, but it was defective on arrival, now waiting for repair. Tolerating meds.   UPDATE (03/31/21, VRP): Since last visit, here for evaluation for electric wheelchair / power chair again. Symptoms are progressive. Severity is moderate. No alleviating or aggravating factors. Tolerating meds / ocrevus. Here for face-face evaluation and mobility assessment for power wheelchair. Has multiple sclerosis and hemiplegia. Cannot perform ADLs due to mobility issues. Cannot propel manual wheelchair due to hand weakness. Living at home with son and mother. Has personal care aid. Not driving currently.   UPDATE (12/10/20, VRP): Since last visit, more left leg weakness and gait difficulty. Requesting evaluation for electric wheelchair / power chair. Symptoms are progressive. Severity is moderate. No alleviating or aggravating factors. Tolerating meds / ocrevus. Here for face-face evaluation of power wheelchair. Cannot perform ADLs due to mobility issues. Cannot propel manual wheelchair due to hand weakness. Living at home with son and mother. Has personal care aid. Not driving currently.   UPDATE (05/21/20, VRP): Since last visit, doing fair. More depression, spasms. Had event last week of body trembling, no LOC, lasting 10 minutes. Symptoms are progressive. Tolerating ocrevus  and muscle relaxers.  UPDATE (11/20/19, VRP): Here for face-face evaluation of wheelchair need. Currently using a wheelchair due to MS and mobility impairment, now needs a replacement. Patient able to propel wheelchair. Overall, since last visit, doing fair. Continues on ocrevus; had mild reaction at IV site last time. No other alleviating or aggravating factors. Tolerating meds.    UPDATE (10/25/18, VRP): Since last visit, doing about the same. Symptoms are stable EXCEPT right hand weakness and coordination is worse. Some right shoulder twitching. Memory loss continues. No alleviating or aggravating factors. Tolerating meds.    UPDATE (04/18/18, VRP): Since last visit, doing well on ocrevus, except had another fall. Recently more urine incont, and right leg swelling. Tolerating meds. No alleviating or aggravating factors.   UPDATE (10/18/17, VRP): Since last visit, doing well. Tolerating ocrevus. No alleviating or aggravating factors. No new events. C/o cough and dry mouth issues.   UPDATE 05/19/17: Since last visit, now transitioned to ocrevus (May 21, June 4). Did well with infusion. Spasms stable. Left leg slightly better. Bladder slightly better. Depression stable. Using rollator walker. No other triggering factors. Overall doing well.   UPDATE 02/08/17: Since last visit, MRI brain shows 1 new lesion. Here to discuss ocrevus. Also with more left leg pain, muscle spasms, gait diff, fatigue. Right ear pain better. Bladder incont slightly better with oxybutinin initially, but not anymore.   UPDATE 12/22/16: Since last visit, was doing well until Nov 2017 and then developed stabbing right ear pain (continues to present). Also with new and increasing spasm and cramps in right hand and left leg since Dec 2017. Continues with fatigue and bladder incontinence.  UPDATE 05/13/16: Since last visit, doing well.  No new MS events or symptoms. In retrospect, may have lost vision in right eye (2015, and possibly March  2016), lasting 3-7 days. Didn't mention to me until now. Recently this was noted optometry and ophthalmology exams.   UPDATE 01/27/16: Since last visit, fatigue improved with adjusting medication timing. Bladder issues stable. Had UTI at last visit, tx'd with abx, now better.  UPDATE 10/22/15: Since last visit was doing well. Then last week had new onset weakness in legs, bladder incontinence, strong smelling urine, and hand tremors. Now sxs almost fully resolved since yesterday.  UPDATE 05/01/15: Symptoms stable. Tolerating tecfidera.  UPDATE 01/25/15: Since last visit, had more problems for a few weeks (lower ext weakness, bladder incont), but now improved. Now on tecfidera x 3 weeks. Some itching and stomach issues with tecfidera, but mild. Main issues now consist of lower extremity pain, spasms, urinary leakage, daytime fatigue.  UPDATE 10/31/14: Since last visit, sxs are stable. Test results and diagnosis reviewed. No new events. Using cane to walk.  UPDATE (10/02/14): 45 year old right-handed female here for evaluation of double vision. 2008 patient was 6 months pregnant with her son, when all of a sudden she didn't feel good. She noticed right hand clumsiness and incoordination. This lasted for approximately 9 months and then stopped. She did not seek medical attention for this problem. 2013 patient had onset of right arm weakness, right leg weakness, numbness on the right side, intermittent shaking sensation all over. Patient was evaluated in [place] and diagnosed with possible stroke. 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 Patient did not recover right arm or leg strength. Her speech was also affected. October 2014 patient moved to [facility]. 08/30/2014 patient was at home hanging clothes over the bathtub when she fell down. She is not sure what caused her fall. Her right leg may have given out. Patient fell and struck her head against the soap dish which broke off. Patient did not lose  consciousness. Patient's family advised her to go to the emergency room. Patient was evaluated with CT and MRI of the brain. No acute findings were found. MRI of the brain did show multiple white matter lesions suspicious for chronic multiple sclerosis versus chronic small vessel ischemic disease. Advised to follow-up in outpatient basis. Since this fall patient has noticed some additional blurred vision, and double vision especially when looking to the left side.   REVIEW OF SYSTEMS: Full 14 system review of systems performed and negative except for: as per HPI.    ALLERGIES: Allergies  Allergen Reactions   Hydrocodone Itching    Stomach cramps   Morphine Itching   Orange Fruit [Citrus]     Acidic foods Makes her itchy and her face "feels like its on fire", have rash.   Oxycodone Itching    rash   Other Rash    Narcotics. Spicy foods (cause seizures)    HOME MEDICATIONS: Outpatient Medications Prior to Visit  Medication Sig Dispense Refill   ARIPiprazole (ABILIFY) 5 MG tablet Take 5 mg by mouth daily.     baclofen (LIORESAL) 10 MG tablet TAKE 2 TABLETS(20 MG) BY MOUTH THREE TIMES DAILY (Patient taking differently: Take 20 mg by mouth 3 (three) times daily.) 180 tablet 11   cholecalciferol (VITAMIN D) 1000 UNITS tablet Take 1,000 Units by mouth daily.     diphenhydrAMINE (BENADRYL) 25 mg capsule Take 25 mg by mouth every 6 (six) hours as needed for allergies.     DULoxetine (CYMBALTA) 30 MG capsule Take 1 capsule (30  mg total) by mouth daily. 30 capsule 2   gabapentin (NEURONTIN) 300 MG capsule TAKE 2 CAPSULES(600 MG) BY MOUTH THREE TIMES DAILY 180 capsule 12   LORazepam (ATIVAN) 1 MG tablet Take 1 mg by mouth daily.     Multiple Vitamins-Minerals (MULTIVITAMIN ADULT PO) Take 1 tablet by mouth daily.     ocrelizumab (OCREVUS) 300 MG/10ML injection Infuse 600 mg IV Every 6 months 20 mL 1   pantoprazole (PROTONIX) 40 MG tablet TAKE 1 TABLET(40 MG) BY MOUTH DAILY 30 tablet 3    Semaglutide, 1 MG/DOSE, (OZEMPIC, 1 MG/DOSE,) 4 MG/3ML SOPN Inject 2.4 mg as directed once a week. 3 mL 1   spironolactone (ALDACTONE) 25 MG tablet Take 25 mg by mouth daily.     tiZANidine (ZANAFLEX) 4 MG tablet TAKE 1 TABLET(4 MG) BY MOUTH THREE TIMES DAILY 90 tablet 12   ferrous sulfate 325 (65 FE) MG tablet Take 1 tablet (325 mg total) by mouth daily. 90 tablet 3   polyethylene glycol (MIRALAX) 17 g packet Take 17 g by mouth daily. 14 each 3   No facility-administered medications prior to visit.    PAST MEDICAL HISTORY: Past Medical History:  Diagnosis Date   Anxiety and depression    Arthritis    Bradycardia    Encounter to establish care 01/22/2021   Family history of adverse reaction to anesthesia    nausea   GERD (gastroesophageal reflux disease)    Headache    Multiple sclerosis (HCC)    Screening mammogram, encounter for 01/22/2021   Seizures (HCC)    told that they are not from the brain but stress related    PAST SURGICAL HISTORY: Past Surgical History:  Procedure Laterality Date   BREAST REDUCTION SURGERY Bilateral 06/23/2021   Procedure: MAMMARY REDUCTION  (BREAST);  Surgeon: Allena Napoleon, MD;  Location: [facility] OR;  Service: Plastics;  Laterality: Bilateral;   CHOLECYSTECTOMY     KNEE SURGERY Bilateral    Arthroscopy    FAMILY HISTORY: Family History  Problem Relation Age of Onset   Hypertension Mother    Atrial fibrillation Mother    Hypertension Father    Atrial fibrillation Maternal Grandmother     SOCIAL HISTORY:  Social History   Socioeconomic History   Marital status: Single    Spouse name: Not on file   Number of children: 1   Years of education: College   Highest education level: Not on file  Occupational History    Employer: OTHER    Comment: disabled  Tobacco Use   Smoking status: Former    Packs/day: 0.35    Years: 3.00    Pack years: 1.05    Types: Cigarettes    Quit date: 12/07/2006    Years since quitting: 14.8   Smokeless  tobacco: Never  Vaping Use   Vaping Use: Never used  Substance and Sexual Activity   Alcohol use: No    Alcohol/week: 0.0 standard drinks    Comment: quit: 2013 (socially)   Drug use: Yes    Types: Marijuana    Comment: Smokes approx 1-2 per day   Sexual activity: Not on file  Other Topics Concern   Not on file  Social History Narrative   Patient lives at home with family.   Caffeine Use: drinks a cup of hot tea with tecfidera in the am   Patient is right handed.   Patient has a college education   Social Determinants of Corporate investment banker Strain:  Not on file  Food Insecurity: Not on file  Transportation Needs: Not on file  Physical Activity: Not on file  Stress: Not on file  Social Connections: Not on file  Intimate Partner Violence: Not on file     PHYSICAL EXAM  Vitals:   09/23/21 1028  BP: 122/72  Pulse: 79  SpO2: 99%  Weight: 275 lb (124.7 kg)  Height: 5\' 8"  (1.727 m)   No results found.   Body mass index is 41.81 kg/m.  GENERAL EXAM: Patient is in no distress; well developed, nourished and groomed; neck is supple  CARDIOVASCULAR: Regular rate and rhythm, no murmurs, no carotid bruits  NEUROLOGIC: MENTAL STATUS: awake, alert, language fluent, comprehension intact, naming intact, fund of knowledge appropriate; SLOW SCANNING SPEECH PATTERN CRANIAL NERVE: PUPILS PINPOINT AND REACTIVE; visual fields full to confrontation, extraocular muscles --> SACCADIC DYSMETRIA; NO PTOSIS; facial sensation symmetric, FACE --> DEC LEFT NL FOLD, hearing intact, palate elevates symmetrically, uvula midline, shoulder shrug symmetric, tongue midline. MOTOR: INCREASED TONE IN RUE and RLE; BUE 3 PROX AND 2-3 DISTAL; BLE 2-3 (right weaker than left) SENSORY: DECR IN RIGHT HAND AND RIGHT FOOT TO ALL MODALITIES COORDINATION: BUE DYSMETRIA; SLOW FINGER AND FOOT TAP ON RIGHT REFLEXES: RUE 2+, LUE 1; BLE TRACE GAIT/STATION: IN MANUAL WHEELCHAIR    DIAGNOSTIC DATA (LABS,  IMAGING, TESTING) - I reviewed patient records, labs, notes, testing and imaging myself where available.  Lab Results  Component Value Date   WBC 5.9 12/10/2020   HGB 15.3 (H) 06/23/2021   HCT 45.0 06/23/2021   MCV 94 12/10/2020   PLT 280 12/10/2020      Component Value Date/Time   NA 139 08/12/2021 1043   K 4.2 08/12/2021 1043   CL 104 08/12/2021 1043   CO2 20 08/12/2021 1043   GLUCOSE 72 08/12/2021 1043   GLUCOSE 85 06/23/2021 1136   BUN 12 08/12/2021 1043   CREATININE 0.79 08/12/2021 1043   CALCIUM 9.3 08/12/2021 1043   PROT 6.7 12/10/2020 1251   ALBUMIN 3.9 12/10/2020 1251   AST 12 12/10/2020 1251   ALT 12 12/10/2020 1251   ALKPHOS 88 12/10/2020 1251   BILITOT 0.4 12/10/2020 1251   GFRNONAA 76 12/10/2020 1251   GFRAA 87 12/10/2020 1251   No results found for: CHOL No results found for: HGBA1C No results found for: VITAMINB12 No results found for: TSH  Vit D, 25-Hydroxy  Date Value Ref Range Status  12/22/2016 34.1 30.0 - 100.0 ng/mL Final    Comment:    Vitamin D deficiency has been defined by the Institute of Medicine and an Endocrine Society practice guideline as a level of serum 25-OH vitamin D less than 20 ng/mL (1,2). The Endocrine Society went on to further define vitamin D insufficiency as a level between 21 and 29 ng/mL (2). 1. IOM (Institute of Medicine). 2010. Dietary reference    intakes for calcium and D. [place]: The    2011. 2. Holick MF, Binkley [place], Bischoff-Ferrari HA, et al.    Evaluation, treatment, and prevention of vitamin D    deficiency: an Endocrine Society clinical practice    guideline. JCEM. 2011 Jul; 96(7):1911-30.   10/22/2015 35.7 30.0 - 100.0 ng/mL Final    Comment:    Vitamin D deficiency has been defined by the Institute of Medicine and an Endocrine Society practice guideline as a level of serum 25-OH vitamin D less than 20 ng/mL (1,2). The Endocrine Society went on to further define vitamin  D insufficiency as a level between 21 and 29 ng/mL (2). 1. IOM (Institute of Medicine). 2010. Dietary reference    intakes for calcium and D. [place]: The    [employer]. 2. Holick MF, Binkley [place], Bischoff-Ferrari HA, et al.    Evaluation, treatment, and prevention of vitamin D    deficiency: an Endocrine Society clinical practice    guideline. JCEM. 2011 Jul; 96(7):1911-30.   05/01/2015 25.3 (L) 30.0 - 100.0 ng/mL Final    Comment:    Vitamin D deficiency has been defined by the Institute of Medicine and an Endocrine Society practice guideline as a level of serum 25-OH vitamin D less than 20 ng/mL (1,2). The Endocrine Society went on to further define vitamin D insufficiency as a level between 21 and 29 ng/mL (2). 1. IOM (Institute of Medicine). 2010. Dietary reference    intakes for calcium and D. [place]: The    [employer]. 2. Holick MF, Binkley [place], Bischoff-Ferrari HA, et al.    Evaluation, treatment, and prevention of vitamin D    deficiency: an Endocrine Society clinical practice    guideline. JCEM. 2011 Jul; 96(7):1911-30.    Lymphocytes Absolute  Date Value Ref Range Status  12/10/2020 1.5 0.7 - 3.1 x10E3/uL Final  11/20/2019 1.4 0.7 - 3.1 x10E3/uL Final  04/18/2019 1.3 0.7 - 3.1 x10E3/uL Final    11/19/18 MRI of the brain with and without contrast shows the following: 1.    T2/flair hyperintense foci in the brainstem, cerebellum, left thalamus and hemispheres in a pattern and configuration consistent with chronic demyelinating plaque associated with multiple sclerosis.  None of the foci appear to be acute and there were no new lesions compared to the 2018 MRI. 2.    There is a normal enhancement pattern and there are no acute findings.  11/19/18 MRI of the cervical spine with and without contrast shows the following: 1.     There are about 8 T2 hyperintense foci within the spinal cord consistent with chronic demyelinating plaque  associated with multiple sclerosis.  None of the foci appears to be acute.  They were all present on the MRI dated 11/05/2015. 2.     There are no significant cervical spine degenerative changes. 3.     There is a normal enhancement pattern and no acute findings.  05/23/20 MRI brain MRI brain without contrast demonstrating: -Mild stable periventricular, pericallosal and subcortical foci of chronic demyelinating plaques. -No acute finding.    Labs - ANA, ANCA, ACE, HIV, RPR - all negative  11/01/14 anti-JCV ab - 1.31 (H) positive  07/27/18 RLE u/s - No evidence of DVT and no evidence of venous insufficiency.    ASSESSMENT AND PLAN  45 y.o. year old female here with multiple focal neurologic attacks since 2008. Neurologic exam demonstrates long tract signs affecting the right arm and right leg more than left side. 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 Other labs negative.   On tecfidera since Feb 2016. Had flare up symptoms in Nov 2016 and Nov-Dec 2017. Also new plaque in Jan 2018. Now on ocrevus since May/June 2018.   Muscle spasms and leg pain (on baclofen, tizanidine and gabapentin).  Fatigue improved with medication regimen adjustment.   Bladder incontinence --> using adult pads and oxybutinin   Depression stable (seeing Dr. Lilian Kapur, [facility]).    Dx:  Multiple sclerosis (HCC) - Plan: CBC with Differential/Platelet, Comprehensive metabolic panel, VITAMIN D 25 Hydroxy (Vit-D  Deficiency, Fractures), Immunoglobulins, QN, A/E/G/M, MR BRAIN W WO CONTRAST  Vitamin D deficiency, unspecified  - Plan: VITAMIN D 25 Hydroxy (Vit-D Deficiency, Fractures)     PLAN:  Multiple sclerosis disease modifying therapy (established problem, progressive) - continue ocrevus - check CBC, CMP every 6 months - check MRI brain - continue multivitamin and vitamin D supplements  Muscle spasms (established problem, stable) - continue baclofen 20mg   three times per day for muscle spasms - continue tizanidine 4mg  three times per day for muscle spasms  MEMORY LOSS - optimize nutrition and exercise - follow up with psychiatry (optimize depression / anxiety treatments; consider caffeine vs stimulant meds) - safety / supervision issues reviewed - no driving, caution with meds, finances  Bilateral leg pain (established problem, stable) - continue gabapentin up to 600mg  three times per day  Bladder incontinence (established problem, improved) - consider follow up with refer to urology (tried and failed oxybutynin)  Depression / anxiety (established problem, stable) - per Dr. [name]) - continue lexapro  Orders Placed This Encounter  Procedures   MR BRAIN W WO CONTRAST   CBC with Differential/Platelet   Comprehensive metabolic panel   VITAMIN D 25 Hydroxy (Vit-D Deficiency, Fractures)   Immunoglobulins, QN, A/E/G/M   Meds ordered this encounter  Medications   baclofen (LIORESAL) 20 MG tablet    Sig: Take 1 tablet (20 mg total) by mouth 3 (three) times daily. TAKE 2 TABLETS(20 MG) BY MOUTH THREE TIMES DAILY    Dispense:  270 tablet    Refill:  4   tiZANidine (ZANAFLEX) 4 MG tablet    Sig: TAKE 1 TABLET(4 MG) BY MOUTH THREE TIMES DAILY    Dispense:  90 tablet    Refill:  12   gabapentin (NEURONTIN) 300 MG capsule    Sig: Take 2 capsules (600 mg total) by mouth 3 (three) times daily.    Dispense:  540 capsule    Refill:  4    Return in about 8 months (around 05/24/2022).    Lilian Kapur, MD 09/23/2021, 10:55 AM Certified in Neurology, Neurophysiology and Neuroimaging  [facility] [address] [place], [facility] [address] [phone]

## 2021-09-23 NOTE — Telephone Encounter (Signed)
I called Shida back from [facility] and advised per Dr. Fonda Kinder note to continue baclofen 20mg  three times per day for muscle spasms. Shida verbalized understanding and appreciation for the call.

## 2021-09-23 NOTE — Telephone Encounter (Signed)
Shida from [employer] [facility] called inquiring about the directions of the baclofen (LIORESAL) 20 MG tablet. The directions are not quite clear, requesting a call back.

## 2021-09-24 ENCOUNTER — Other Ambulatory Visit: Payer: Self-pay

## 2021-09-30 ENCOUNTER — Telehealth: Payer: Self-pay

## 2021-09-30 NOTE — Telephone Encounter (Signed)
Pt pharmacy [facility] Is requesting a call back about her medication Semaglutide, 1 MG/DOSE, (OZEMPIC, 1 MG/DOSE,) 4 MG/3ML SOPN

## 2021-09-30 NOTE — Telephone Encounter (Signed)
I called [facility] who stated Ozempic rx was transferred to them b/c [facility] does not have it in stock. Stated do not have 2 mg pens which is on backorder they have only 1 mg. Also stated 2.4 mg does not exist; only come in 1 mg or 2 mg. Qty of 3 ml is correct. Need to send new rx to [facility]. Thanks

## 2021-10-01 LAB — COMPREHENSIVE METABOLIC PANEL
ALT: 9 IU/L (ref 0–32)
AST: 13 IU/L (ref 0–40)
Albumin/Globulin Ratio: 1.6 (ref 1.2–2.2)
Albumin: 4 g/dL (ref 3.8–4.8)
Alkaline Phosphatase: 85 IU/L (ref 44–121)
BUN/Creatinine Ratio: 19 (ref 9–23)
BUN: 15 mg/dL (ref 6–24)
Bilirubin Total: 0.2 mg/dL (ref 0.0–1.2)
CO2: 23 mmol/L (ref 20–29)
Calcium: 9.1 mg/dL (ref 8.7–10.2)
Chloride: 105 mmol/L (ref 96–106)
Creatinine, Ser: 0.79 mg/dL (ref 0.57–1.00)
Globulin, Total: 2.5 g/dL (ref 1.5–4.5)
Glucose: 60 mg/dL — ABNORMAL LOW (ref 70–99)
Potassium: 4 mmol/L (ref 3.5–5.2)
Sodium: 141 mmol/L (ref 134–144)
Total Protein: 6.5 g/dL (ref 6.0–8.5)
eGFR: 95 mL/min/{1.73_m2} (ref >59)

## 2021-10-01 LAB — CBC WITH DIFFERENTIAL/PLATELET
Basophils Absolute: 0 10*3/uL (ref 0.0–0.2)
Basos: 1 %
EOS (ABSOLUTE): 0.1 10*3/uL (ref 0.0–0.4)
Eos: 2 %
Hematocrit: 43 % (ref 34.0–46.6)
Hemoglobin: 14.2 g/dL (ref 11.1–15.9)
Immature Grans (Abs): 0 10*3/uL (ref 0.0–0.1)
Immature Granulocytes: 0 %
Lymphocytes Absolute: 0.9 10*3/uL (ref 0.7–3.1)
Lymphs: 24 %
MCH: 31.1 pg (ref 26.6–33.0)
MCHC: 33 g/dL (ref 31.5–35.7)
MCV: 94 fL (ref 79–97)
Monocytes Absolute: 0.4 10*3/uL (ref 0.1–0.9)
Monocytes: 10 %
Neutrophils Absolute: 2.4 10*3/uL (ref 1.4–7.0)
Neutrophils: 63 %
Platelets: 273 10*3/uL (ref 150–450)
RBC: 4.56 x10E6/uL (ref 3.77–5.28)
RDW: 12.2 % (ref 11.7–15.4)
WBC: 3.8 10*3/uL (ref 3.4–10.8)

## 2021-10-01 LAB — IMMUNOGLOBULINS A/E/G/M, SERUM
IgA/Immunoglobulin A, Serum: 350 mg/dL (ref 87–352)
IgE (Immunoglobulin E), Serum: 15 IU/mL (ref 6–495)
IgG (Immunoglobin G), Serum: 1189 mg/dL (ref 586–1602)
IgM (Immunoglobulin M), Srm: 63 mg/dL (ref 26–217)

## 2021-10-01 LAB — VITAMIN D 25 HYDROXY (VIT D DEFICIENCY, FRACTURES): Vit D, 25-Hydroxy: 38.1 ng/mL (ref 30.0–100.0)

## 2021-10-01 MED ORDER — OZEMPIC (1 MG/DOSE) 4 MG/3ML ~~LOC~~ SOPN: 2.0000 mg | PEN_INJECTOR | SUBCUTANEOUS | 1 refills | Status: DC

## 2021-10-01 NOTE — Telephone Encounter (Signed)
Rx for Ozempic 2mg  weekly sent to [facility]. Ordered via 1mg  pens as [facility] does not currently have 2mg  pens in stock

## 2021-10-08 ENCOUNTER — Other Ambulatory Visit: Payer: Self-pay

## 2021-10-08 ENCOUNTER — Ambulatory Visit
Admission: RE | Admit: 2021-10-08 | Discharge: 2021-10-08 | Disposition: A | Discharge status: Home / Self Care | Payer: Medicare Other | Source: Ambulatory Visit | Attending: Diagnostic Neuroimaging | Admitting: Diagnostic Neuroimaging

## 2021-10-08 DIAGNOSIS — G35 Multiple sclerosis: Secondary | ICD-10-CM

## 2021-10-08 MED ORDER — GADOBENATE DIMEGLUMINE 529 MG/ML IV SOLN
20.0000 mL | Freq: Once | INTRAVENOUS | Status: AC | PRN
  Administered 2021-10-08: 20 mL via INTRAVENOUS

## 2021-10-13 ENCOUNTER — Other Ambulatory Visit: Payer: Self-pay | Admitting: *Deleted

## 2021-10-13 DIAGNOSIS — R29898 Other symptoms and signs involving the musculoskeletal system: Secondary | ICD-10-CM

## 2021-10-13 DIAGNOSIS — G35 Multiple sclerosis: Secondary | ICD-10-CM

## 2021-11-04 ENCOUNTER — Encounter: Payer: Self-pay | Admitting: Diagnostic Neuroimaging

## 2021-12-08 DIAGNOSIS — G35 Multiple sclerosis: Secondary | ICD-10-CM | POA: Diagnosis not present

## 2021-12-09 DIAGNOSIS — G35 Multiple sclerosis: Secondary | ICD-10-CM | POA: Diagnosis not present

## 2021-12-10 ENCOUNTER — Telehealth: Payer: Self-pay | Admitting: *Deleted

## 2021-12-10 ENCOUNTER — Other Ambulatory Visit: Payer: Self-pay | Admitting: *Deleted

## 2021-12-10 DIAGNOSIS — G35 Multiple sclerosis: Secondary | ICD-10-CM | POA: Diagnosis not present

## 2021-12-10 NOTE — Telephone Encounter (Signed)
Ocrevus orders faxed to [facility]. Patient has infusion scheduled for tomorrow. Received confirmation of fax. Sent message to [facility] RN.

## 2021-12-11 ENCOUNTER — Other Ambulatory Visit: Payer: Self-pay

## 2021-12-11 ENCOUNTER — Telehealth: Payer: Self-pay | Admitting: *Deleted

## 2021-12-11 ENCOUNTER — Non-Acute Institutional Stay (HOSPITAL_COMMUNITY)
Admission: RE | Admit: 2021-12-11 | Discharge: 2021-12-11 | Disposition: A | Discharge status: Home / Self Care | Payer: Medicare HMO | Source: Ambulatory Visit | Attending: Internal Medicine | Admitting: Internal Medicine

## 2021-12-11 DIAGNOSIS — G35 Multiple sclerosis: Secondary | ICD-10-CM | POA: Insufficient documentation

## 2021-12-11 MED ORDER — DIPHENHYDRAMINE HCL 50 MG/ML IJ SOLN
50.0000 mg | Freq: Once | INTRAMUSCULAR | Status: AC
  Administered 2021-12-11: 50 mg via INTRAVENOUS
  Filled 2021-12-11: qty 1

## 2021-12-11 MED ORDER — FAMOTIDINE IN NACL 20-0.9 MG/50ML-% IV SOLN
20.0000 mg | Freq: Once | INTRAVENOUS | Status: AC
  Administered 2021-12-11: 20 mg via INTRAVENOUS
  Filled 2021-12-11: qty 50

## 2021-12-11 MED ORDER — SODIUM CHLORIDE 0.9 % IV SOLN: INTRAVENOUS | Status: DC | PRN

## 2021-12-11 MED ORDER — ACETAMINOPHEN 325 MG PO TABS
650.0000 mg | ORAL_TABLET | Freq: Once | ORAL | Status: AC
  Administered 2021-12-11: 650 mg via ORAL
  Filled 2021-12-11: qty 2

## 2021-12-11 MED ORDER — METHYLPREDNISOLONE SODIUM SUCC 125 MG IJ SOLR
125.0000 mg | Freq: Once | INTRAMUSCULAR | Status: AC
  Administered 2021-12-11: 125 mg via INTRAVENOUS
  Filled 2021-12-11: qty 2

## 2021-12-11 MED ORDER — SODIUM CHLORIDE 0.9 % IV SOLN
600.0000 mg | Freq: Once | INTRAVENOUS | Status: AC
  Administered 2021-12-11: 600 mg via INTRAVENOUS
  Filled 2021-12-11: qty 20

## 2021-12-11 NOTE — Progress Notes (Signed)
PATIENT CARE CENTER NOTE     Diagnosis: Multiple Sclerosis G35     Provider: Andrey Spearman, MD     Procedure: Ocrevus infusion      Note: Patient received Ocrevus 600 mg infusion via PIV. Pre medications given per order. Infusion titrated per protocol. Patient tolerated well with no adverse reaction. Patient declined 1 hour observation post infusion. Patient's BP elevated pre infusion but wln post infusion at 129/81. Discharge instructions given. Patient alert, oriented and transferred in wheelchair at discharge.

## 2021-12-11 NOTE — Progress Notes (Signed)
Patient arrived for Ocrevus infusion. Pre medications given per order and vital signs taken prior to infusion. Patient's blood pressure elevated above baseline at 145/91. Notified Debbrah Alar, RN at [facility] via secure chat. Mary relayed the message to Dr. Marjory Lies. Per Dr. Marjory Lies, alright to proceed with Ocrevus infusion. Will proceed with Ocrevus infusion and continue to update [facility] on status of patient's blood pressure.

## 2021-12-11 NOTE — Telephone Encounter (Signed)
Received message from [facility] RN: patient's BP 145/91 prior to Ocrevus infusion. Per VO from Dr Marjory Lies, okay to go ahead with infusion. At 12:10 pm Ebony Warren reported BP is now 130/84, MD aware.

## 2021-12-12 DIAGNOSIS — G35 Multiple sclerosis: Secondary | ICD-10-CM | POA: Diagnosis not present

## 2021-12-15 DIAGNOSIS — G35 Multiple sclerosis: Secondary | ICD-10-CM | POA: Diagnosis not present

## 2021-12-16 ENCOUNTER — Encounter: Payer: Medicare Other | Admitting: Internal Medicine

## 2021-12-16 DIAGNOSIS — G35 Multiple sclerosis: Secondary | ICD-10-CM | POA: Diagnosis not present

## 2021-12-16 DIAGNOSIS — R69 Illness, unspecified: Secondary | ICD-10-CM | POA: Diagnosis not present

## 2021-12-16 DIAGNOSIS — F419 Anxiety disorder, unspecified: Secondary | ICD-10-CM | POA: Diagnosis not present

## 2021-12-17 DIAGNOSIS — G35 Multiple sclerosis: Secondary | ICD-10-CM | POA: Diagnosis not present

## 2021-12-18 DIAGNOSIS — G35 Multiple sclerosis: Secondary | ICD-10-CM | POA: Diagnosis not present

## 2021-12-19 DIAGNOSIS — G35 Multiple sclerosis: Secondary | ICD-10-CM | POA: Diagnosis not present

## 2021-12-23 DIAGNOSIS — G35 Multiple sclerosis: Secondary | ICD-10-CM | POA: Diagnosis not present

## 2021-12-24 DIAGNOSIS — G35 Multiple sclerosis: Secondary | ICD-10-CM | POA: Diagnosis not present

## 2021-12-25 DIAGNOSIS — G35 Multiple sclerosis: Secondary | ICD-10-CM | POA: Diagnosis not present

## 2021-12-26 DIAGNOSIS — G35 Multiple sclerosis: Secondary | ICD-10-CM | POA: Diagnosis not present

## 2021-12-29 DIAGNOSIS — G35 Multiple sclerosis: Secondary | ICD-10-CM | POA: Diagnosis not present

## 2021-12-30 DIAGNOSIS — H47293 Other optic atrophy, bilateral: Secondary | ICD-10-CM | POA: Diagnosis not present

## 2021-12-30 DIAGNOSIS — H40023 Open angle with borderline findings, high risk, bilateral: Secondary | ICD-10-CM | POA: Diagnosis not present

## 2021-12-31 DIAGNOSIS — G35 Multiple sclerosis: Secondary | ICD-10-CM | POA: Diagnosis not present

## 2022-01-01 DIAGNOSIS — G35 Multiple sclerosis: Secondary | ICD-10-CM | POA: Diagnosis not present

## 2022-01-02 DIAGNOSIS — G35 Multiple sclerosis: Secondary | ICD-10-CM | POA: Diagnosis not present

## 2022-01-05 DIAGNOSIS — R69 Illness, unspecified: Secondary | ICD-10-CM | POA: Diagnosis not present

## 2022-01-05 DIAGNOSIS — G35 Multiple sclerosis: Secondary | ICD-10-CM | POA: Diagnosis not present

## 2022-01-05 DIAGNOSIS — F419 Anxiety disorder, unspecified: Secondary | ICD-10-CM | POA: Diagnosis not present

## 2022-01-06 DIAGNOSIS — G35 Multiple sclerosis: Secondary | ICD-10-CM | POA: Diagnosis not present

## 2022-01-07 ENCOUNTER — Encounter: Payer: Medicaid Other | Admitting: Internal Medicine

## 2022-01-07 DIAGNOSIS — G35 Multiple sclerosis: Secondary | ICD-10-CM | POA: Diagnosis not present

## 2022-01-08 ENCOUNTER — Ambulatory Visit (INDEPENDENT_AMBULATORY_CARE_PROVIDER_SITE_OTHER): Payer: Medicare HMO | Admitting: Internal Medicine

## 2022-01-08 ENCOUNTER — Ambulatory Visit (HOSPITAL_COMMUNITY)
Admission: RE | Admit: 2022-01-08 | Discharge: 2022-01-08 | Disposition: A | Discharge status: Home / Self Care | Payer: Medicare HMO | Source: Ambulatory Visit | Attending: Student in an Organized Health Care Education/Training Program | Admitting: Student in an Organized Health Care Education/Training Program

## 2022-01-08 ENCOUNTER — Encounter: Payer: Self-pay | Admitting: Internal Medicine

## 2022-01-08 ENCOUNTER — Other Ambulatory Visit: Payer: Self-pay

## 2022-01-08 ENCOUNTER — Encounter: Payer: Medicaid Other | Admitting: Internal Medicine

## 2022-01-08 VITALS — BP 136/91 | HR 71 | Temp 98.2°F | Resp 24 | Ht 68.0 in | Wt 273.7 lb

## 2022-01-08 DIAGNOSIS — W19XXXA Unspecified fall, initial encounter: Secondary | ICD-10-CM | POA: Insufficient documentation

## 2022-01-08 DIAGNOSIS — M25562 Pain in left knee: Secondary | ICD-10-CM | POA: Diagnosis not present

## 2022-01-08 DIAGNOSIS — G35 Multiple sclerosis: Secondary | ICD-10-CM | POA: Diagnosis not present

## 2022-01-08 DIAGNOSIS — J449 Chronic obstructive pulmonary disease, unspecified: Secondary | ICD-10-CM | POA: Diagnosis not present

## 2022-01-08 DIAGNOSIS — M1712 Unilateral primary osteoarthritis, left knee: Secondary | ICD-10-CM | POA: Diagnosis not present

## 2022-01-08 DIAGNOSIS — M25462 Effusion, left knee: Secondary | ICD-10-CM | POA: Diagnosis not present

## 2022-01-08 NOTE — Progress Notes (Signed)
Subjective:  CC: Fall/Knee pain  HPI:  Ebony Warren is a 46 y.o. female with a past medical history stated below and presents today for knee pain. Please see problem based assessment and plan for additional details.  Past Medical History:  Diagnosis Date   Anxiety and depression    Arthritis    Bradycardia    Encounter to establish care 01/22/2021   Family history of adverse reaction to anesthesia    nausea   GERD (gastroesophageal reflux disease)    Headache    Multiple sclerosis (HCC)    Screening mammogram, encounter for 01/22/2021   Seizures (HCC)    told that they are not from the brain but stress related    Current Outpatient Medications on File Prior to Visit  Medication Sig Dispense Refill   ARIPiprazole (ABILIFY) 5 MG tablet Take 5 mg by mouth daily.     baclofen (LIORESAL) 20 MG tablet Take 1 tablet (20 mg total) by mouth 3 (three) times daily. TAKE 2 TABLETS(20 MG) BY MOUTH THREE TIMES DAILY (Patient taking differently: Take 20 mg by mouth 3 (three) times daily. TAKE 1 TABLETS(20 MG) BY MOUTH THREE TIMES DAILY) 270 tablet 4   cholecalciferol (VITAMIN D) 1000 UNITS tablet Take 1,000 Units by mouth daily.     diphenhydrAMINE (BENADRYL) 25 mg capsule Take 25 mg by mouth every 6 (six) hours as needed for allergies.     DULoxetine (CYMBALTA) 30 MG capsule Take 1 capsule (30 mg total) by mouth daily. 30 capsule 2   gabapentin (NEURONTIN) 300 MG capsule Take 2 capsules (600 mg total) by mouth 3 (three) times daily. 540 capsule 4   LORazepam (ATIVAN) 1 MG tablet Take 1 mg by mouth daily.     Multiple Vitamins-Minerals (MULTIVITAMIN ADULT PO) Take 1 tablet by mouth daily.     ocrelizumab (OCREVUS) 300 MG/10ML injection Infuse 600 mg IV Every 6 months 20 mL 1   pantoprazole (PROTONIX) 40 MG tablet TAKE 1 TABLET(40 MG) BY MOUTH DAILY 30 tablet 3   Semaglutide, 1 MG/DOSE, (OZEMPIC, 1 MG/DOSE,) 4 MG/3ML SOPN Inject 2 mg as directed once a week. 3 mL 1    spironolactone (ALDACTONE) 25 MG tablet Take 25 mg by mouth daily.     tiZANidine (ZANAFLEX) 4 MG tablet TAKE 1 TABLET(4 MG) BY MOUTH THREE TIMES DAILY 90 tablet 12   No current facility-administered medications on file prior to visit.    Family History  Problem Relation Age of Onset   Hypertension Mother    Atrial fibrillation Mother    Hypertension Father    Atrial fibrillation Maternal Grandmother     Social History   Socioeconomic History   Marital status: Single    Spouse name: Not on file   Number of children: 1   Years of education: College   Highest education level: Not on file  Occupational History    Employer: OTHER    Comment: disabled  Tobacco Use   Smoking status: Former    Packs/day: 0.35    Years: 3.00    Pack years: 1.05    Types: Cigarettes    Quit date: 12/07/2006    Years since quitting: 15.1   Smokeless tobacco: Never  Vaping Use   Vaping Use: Never used  Substance and Sexual Activity   Alcohol use: No    Alcohol/week: 0.0 standard drinks    Comment: quit: 2013 (socially)   Drug use: Yes    Types: Marijuana  Comment: Smokes approx 1-2 per day   Sexual activity: Not on file  Other Topics Concern   Not on file  Social History Narrative   Patient lives at home with family.   Caffeine Use: drinks a cup of hot tea with tecfidera in the am   Patient is right handed.   Patient has a college education   Social Determinants of Corporate investment banker Strain: Not on file  Food Insecurity: Not on file  Transportation Needs: Not on file  Physical Activity: Not on file  Stress: Not on file  Social Connections: Not on file  Intimate Partner Violence: Not on file    Review of Systems: ROS negative except for what is noted on the assessment and plan.  Objective:   Vitals:   01/08/22 1045 01/08/22 1051  BP: (!) 137/94 (!) 136/91  Pulse: 68 71  Resp: (!) 24   Temp: 98.2 F (36.8 C)   TempSrc: Oral   SpO2: 99%   Weight: 273 lb 11.2 oz  (124.1 kg)   Height: 5\' 8"  (1.727 m)     Physical Exam: Gen: A&O x3 and in no apparent distress, well appearing and nourished. Neck: no masses or nodules, AROM intact. CV: RRR, no murmurs, S1/S2 presents  Resp: Clear to ascultation bilaterally  Abd: BS (+) x4, soft, non-tender abdomen, without hepatosplenomegaly or masses MSK: Grossly normal AROM and strength x4 extremities.negative valgus/varus force, anterior/posterior drawer, negative patellar grind test.  No significant ecchymoses or swelling Skin: good skin turgor, no rashes, unusual bruising, or prominent lesions.  No rash or abrasions over the knee. Neuro: No focal deficits, grossly normal sensation and coordination.     Assessment & Plan:  See Encounters Tab for problem based charting.  Patient discussed with Dr. , D.O. [facility]   PGY-3 Pager: [phone]   Phone: [phone] Date 01/09/2022   Time 9:09 AM

## 2022-01-08 NOTE — Patient Instructions (Signed)
Thank you, [name] for allowing [name] to provide your care today. Today we discussed knee pain.    Labs/Tests Ordered: - knee x-rays  Referrals Ordered:  Referral Orders  No referral(s) requested today     Medication Changes:  Discontinue ozempic.  No orders of the defined types were placed in this encounter.    Health Maintenance Screening: There are no preventive care reminders to display for this patient.   Instructions:   Follow up: 4-6 months   Remember: If you have any questions or concerns, call our clinic at [phone] or after hours call [phone] and ask for the internal medicine resident on call.  Dellia Cloud, D.O. [facility]

## 2022-01-09 ENCOUNTER — Encounter: Payer: Self-pay | Admitting: Internal Medicine

## 2022-01-09 DIAGNOSIS — W19XXXA Unspecified fall, initial encounter: Secondary | ICD-10-CM | POA: Insufficient documentation

## 2022-01-09 DIAGNOSIS — G35 Multiple sclerosis: Secondary | ICD-10-CM | POA: Diagnosis not present

## 2022-01-09 NOTE — Assessment & Plan Note (Signed)
Patient states that she would like to discontinue Ozempic secondary to medication side effect.  She states that she is having increased drowsiness with this medication.  She has stopped this medication since 2 weeks prior with improvement of her drowsiness.

## 2022-01-09 NOTE — Assessment & Plan Note (Signed)
HPI: Patient presents for further evaluation and management of knee pain after a recent fall.  Patient states that she had a fall on Christmas Eve.  She states that she was using her walker to walk to the bathroom where she lost her balance and fell to her knees.  She states that she was having a difficult time getting up as her legs were underneath her.  She states that the knee pain has been persistent since then on the lateral aspect of her left knee.  The pain does not radiate.  She endorses some swelling but no ecchymoses.  She now has pain with motion of that knee and difficulty standing/maintaining weight on her leg.  Her presentation is complicated by her recent multiple sclerosis flare in the last 2 weeks in which she is been limited to her bed and wheelchair due to lower extremity weakness and balance problems.  She has tried over-the-counter pain medicine including acetaminophen and hot packs with minimal improvement.  She denies any systemic signs of infection including fever, chills, myalgias or arthralgias.  On exam today patient has point tenderness over the lateral epicondyle of the femur with exacerbation of her pain with flexion extension of her distal leg at the knee.  Negative valgus/varus stress test, negative anterior drawer, negative posterior drawer, negative patellar grind.  No significant effusion noted on exam.  There is no surrounding erythema or rash.     Assessment/Plan: Knee pain: -We will get an x-ray of the knee to rule out bony abnormality -Unlikely that the patient has high grade knee sprain to due good stability of the joint without pain on provocative tests. -Would likely benefit from conservative management with activity modifications, NSAIDs, and possible physical therapy.

## 2022-01-10 ENCOUNTER — Telehealth: Payer: Self-pay | Admitting: Internal Medicine

## 2022-01-10 DIAGNOSIS — W19XXXA Unspecified fall, initial encounter: Secondary | ICD-10-CM

## 2022-01-10 MED ORDER — MELOXICAM 7.5 MG PO TABS: 7.5000 mg | ORAL_TABLET | Freq: Every day | ORAL | 0 refills | Status: AC

## 2022-01-10 NOTE — Telephone Encounter (Signed)
Called patient and informed her of her XR results showing moderate to severe osteoarthritis. Discussed conservative management with NSAIDS and PT. She is in agreement. She denies any history of GI bleed and is not on anticoagulants. Previous kidney function was WNL.  Chari Manning, D.O.  Internal Medicine Resident, PGY-3 [facility]  Pager: [phone] 6:17 PM, 01/10/2022

## 2022-01-12 DIAGNOSIS — G35 Multiple sclerosis: Secondary | ICD-10-CM | POA: Diagnosis not present

## 2022-01-12 NOTE — Progress Notes (Signed)
Internal Medicine Clinic Attending  Case discussed with Dr. Coe  At the time of the visit.  We reviewed the resident's history and exam and pertinent patient test results.  I agree with the assessment, diagnosis, and plan of care documented in the resident's note.  

## 2022-01-13 DIAGNOSIS — G35 Multiple sclerosis: Secondary | ICD-10-CM | POA: Diagnosis not present

## 2022-01-14 DIAGNOSIS — G35 Multiple sclerosis: Secondary | ICD-10-CM | POA: Diagnosis not present

## 2022-01-15 DIAGNOSIS — G35 Multiple sclerosis: Secondary | ICD-10-CM | POA: Diagnosis not present

## 2022-01-16 DIAGNOSIS — G35 Multiple sclerosis: Secondary | ICD-10-CM | POA: Diagnosis not present

## 2022-01-19 DIAGNOSIS — G35 Multiple sclerosis: Secondary | ICD-10-CM | POA: Diagnosis not present

## 2022-01-20 DIAGNOSIS — G35 Multiple sclerosis: Secondary | ICD-10-CM | POA: Diagnosis not present

## 2022-01-21 DIAGNOSIS — G35 Multiple sclerosis: Secondary | ICD-10-CM | POA: Diagnosis not present

## 2022-01-22 DIAGNOSIS — G35 Multiple sclerosis: Secondary | ICD-10-CM | POA: Diagnosis not present

## 2022-01-23 DIAGNOSIS — G35 Multiple sclerosis: Secondary | ICD-10-CM | POA: Diagnosis not present

## 2022-01-26 DIAGNOSIS — Z79899 Other long term (current) drug therapy: Secondary | ICD-10-CM | POA: Diagnosis not present

## 2022-01-26 DIAGNOSIS — G35 Multiple sclerosis: Secondary | ICD-10-CM | POA: Diagnosis not present

## 2022-01-26 DIAGNOSIS — R69 Illness, unspecified: Secondary | ICD-10-CM | POA: Diagnosis not present

## 2022-01-26 DIAGNOSIS — F329 Major depressive disorder, single episode, unspecified: Secondary | ICD-10-CM | POA: Diagnosis not present

## 2022-01-26 DIAGNOSIS — F411 Generalized anxiety disorder: Secondary | ICD-10-CM | POA: Diagnosis not present

## 2022-01-27 ENCOUNTER — Telehealth: Payer: Self-pay | Admitting: Diagnostic Neuroimaging

## 2022-01-27 DIAGNOSIS — F419 Anxiety disorder, unspecified: Secondary | ICD-10-CM | POA: Diagnosis not present

## 2022-01-27 DIAGNOSIS — G35 Multiple sclerosis: Secondary | ICD-10-CM | POA: Diagnosis not present

## 2022-01-27 DIAGNOSIS — R69 Illness, unspecified: Secondary | ICD-10-CM | POA: Diagnosis not present

## 2022-01-27 NOTE — Telephone Encounter (Signed)
Pt notifying to all medications at [facility] changed mail service.  [facility] Phone: [phone] Fax [phone]

## 2022-01-27 NOTE — Telephone Encounter (Signed)
Noted, pharmacy changed.

## 2022-01-28 DIAGNOSIS — G35 Multiple sclerosis: Secondary | ICD-10-CM | POA: Diagnosis not present

## 2022-01-29 DIAGNOSIS — G35 Multiple sclerosis: Secondary | ICD-10-CM | POA: Diagnosis not present

## 2022-01-30 DIAGNOSIS — G35 Multiple sclerosis: Secondary | ICD-10-CM | POA: Diagnosis not present

## 2022-02-02 DIAGNOSIS — G35 Multiple sclerosis: Secondary | ICD-10-CM | POA: Diagnosis not present

## 2022-02-03 DIAGNOSIS — G35 Multiple sclerosis: Secondary | ICD-10-CM | POA: Diagnosis not present

## 2022-02-04 ENCOUNTER — Other Ambulatory Visit: Payer: Self-pay

## 2022-02-04 ENCOUNTER — Encounter: Payer: Self-pay | Admitting: Rehabilitation

## 2022-02-04 ENCOUNTER — Ambulatory Visit: Payer: Medicare HMO | Attending: Internal Medicine | Admitting: Rehabilitation

## 2022-02-04 DIAGNOSIS — R2681 Unsteadiness on feet: Secondary | ICD-10-CM | POA: Insufficient documentation

## 2022-02-04 DIAGNOSIS — M6281 Muscle weakness (generalized): Secondary | ICD-10-CM | POA: Diagnosis not present

## 2022-02-04 DIAGNOSIS — R293 Abnormal posture: Secondary | ICD-10-CM | POA: Diagnosis not present

## 2022-02-04 DIAGNOSIS — R2689 Other abnormalities of gait and mobility: Secondary | ICD-10-CM | POA: Insufficient documentation

## 2022-02-04 DIAGNOSIS — G35 Multiple sclerosis: Secondary | ICD-10-CM | POA: Diagnosis not present

## 2022-02-04 DIAGNOSIS — R296 Repeated falls: Secondary | ICD-10-CM | POA: Diagnosis not present

## 2022-02-04 NOTE — Therapy (Signed)
OUTPATIENT PHYSICAL THERAPY NEURO EVALUATION   Patient Name: Ebony Warren MRN: [id number] DOB:04/05/1976, 46 y.o., female Today's Date: 02/04/2022  PCP: Jaci Standard, DO REFERRING PROVIDER: Miguel Aschoff, MD    Past Medical History:  Diagnosis Date   Anxiety and depression    Arthritis    Bradycardia    Encounter to establish care 01/22/2021   Family history of adverse reaction to anesthesia    nausea   GERD (gastroesophageal reflux disease)    Headache    Multiple sclerosis (HCC)    Screening mammogram, encounter for 01/22/2021   Seizures (HCC)    told that they are not from the brain but stress related   Past Surgical History:  Procedure Laterality Date   BREAST REDUCTION SURGERY Bilateral 06/23/2021   Procedure: MAMMARY REDUCTION  (BREAST);  Surgeon: Allena Napoleon, MD;  Location: [facility] OR;  Service: Plastics;  Laterality: Bilateral;   CHOLECYSTECTOMY     KNEE SURGERY Bilateral    Arthroscopy   Patient Active Problem List   Diagnosis Date Noted   Fall 01/09/2022   Cramp and spasm 08/12/2021   Restless leg syndrome 08/12/2021   Healthcare maintenance 08/12/2021   Macromastia 06/23/2021   Gastroesophageal reflux disease 01/22/2021   COVID-19 virus infection 01/22/2021   Morbid obesity (HCC) 01/22/2021   Right optic neuritis 05/13/2016   Slurred speech 01/27/2016   Other fatigue 01/27/2016   Depression 01/27/2016   Absence of bladder continence 01/27/2016   Multiple sclerosis (HCC) 10/31/2014   Hemiplegia, unspecified, affecting dominant side 08/30/2014    ONSET DATE: 01/10/22 (referral date, fall was 11/29/21)  REFERRING DIAG: W19.Lorne Skeens (ICD-10-CM) - Fall, initial encounter   THERAPY DIAG:  Unsteadiness on feet  Repeated falls  Muscle weakness (generalized)  SUBJECTIVE:   SUBJECTIVE STATEMENT: I have MS and between that and OA in my legs, I have very little control over my legs.                                                                                                                                                                                                                PERTINENT HISTORY: 46 y.o. year old female here with multiple focal neurologic attacks since 2008. Neurologic exam demonstrates long tract signs affecting the right arm and right leg more than left side. 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 Other labs negative. Patient presents for further evaluation and management of knee pain after a recent fall.  Patient states that she had a fall  on Christmas Eve.  She states that she was using her walker to walk to the bathroom where she lost her balance and fell to her knees.  She states that she was having a difficult time getting up as her legs were underneath her.  She states that the knee pain has been persistent since then on the lateral aspect of her left knee.     PAIN:  Are you having pain? No   PRECAUTIONS: Fall  WEIGHT BEARING RESTRICTIONS No  FALLS: Has patient fallen in last 6 months? Yes, Number of falls: 1  LIVING ENVIRONMENT: Lives with: lives with their family Lives in: House/apartment Stairs: No;  Has following equipment at home: Environmental consultant - 4 wheeled, Wheelchair (power), Tour manager, and Ramped entry  PLOF: Independent with transfers, Needs assistance with ADLs, Needs assistance with homemaking, Needs assistance with gait, and Needs assistance with transfers  PATIENT GOALS : I want to be able to run a mile!   OBJECTIVE:     COGNITION: Overall cognitive status: History of cognitive impairments - at baseline    SENSATION: Light touch: Deficits Some hypersensitivity by B ankles Stereognosis: Appears intact Hot/Cold: Appears intact Proprioception: Appears intact  COORDINATION: Unable to perform due to weakness   EDEMA:  Swelling noted in B LEs, pitting edema   MUSCLE TONE: No tone noted in either LE with testing    POSTURE: rounded  shoulders, forward head, and posterior pelvic tilt  AROM/PROM:  A/PROM Right 02/04/2022 Left 02/04/2022  Hip flexion    Hip extension    Hip abduction    Hip adduction    Hip internal rotation    Hip external rotation    Knee flexion    Knee extension    Ankle dorsiflexion    Ankle plantarflexion    Ankle inversion    Ankle eversion     (Blank rows = not tested) MMT:  MMT Right 02/04/2022 Left 02/04/2022  Hip flexion 1/5 2/5  Hip extension    Hip abduction 1/5 2/5  Hip adduction    Hip internal rotation    Hip external rotation    Knee flexion 1/5 2+/5  Knee extension 2/5 3/5  Ankle dorsiflexion 2-/5 3+/5  Ankle plantarflexion 2/5 4/5  Ankle inversion    Ankle eversion    (Blank rows = not tested)  BED MOBILITY:  Sit to supine Min A Supine to sit Min A  TRANSFERS: Assistive device utilized: Environmental consultant - 2 wheeled  Sit to stand: Mod A Stand to sit: Mod A Chair to chair:  +2 for safety (her caregiver assisted, mostly mod/maxA)   GAIT: Gait pattern: step to pattern, decreased stride length, decreased hip/knee flexion- Right, decreased hip/knee flexion- Left, decreased ankle dorsiflexion- Right, decreased ankle dorsiflexion- Left, Right foot flat, lateral hip instability, trunk flexed, and poor foot clearance- Right Distance walked: 5' Assistive device utilized: Environmental consultant - 2 wheeled (wide) Level of assistance: Mod A, Max A, and +2 for w/c follow Comments: Pt very limited on distance she could walk today.  Note marked difficulty with WB through RLE when advancing LLE.  She reports fluctuation in mobility and at times she is able to ambulate much better than today.   FUNCTIONAL TESTs:  Not appropriate at this time     TODAY'S TREATMENT:  Evaluation completed, education given as below   PATIENT EDUCATION: Education details: Provided education on evaluation findings, POC, goals Person educated: Patient and Caregiver Education method: Explanation and Verbal  cues Education comprehension: verbalized understanding  and needs further education   HOME EXERCISE PROGRAM: None at this time  ASSESSMENT:  CLINICAL IMPRESSION: Pt is a 46 y.o. year old female here with diagnosis of MS and has been utilizing power w/c for a while, but is able to stand and walk very short distances with assistance.  She had a fall walking to restroom in December causing L knee pain, but has been cleared and given medication for OA.  RLE with very little active (trace) movement upon PT evaluation.  She requires mod/max A for sit<>stand, transfers and gait.  She requires heavy reliance on UEs for standing/gait and has very limited standing balance without support. She is currently not transferring with RW and feel that this would be a goal to work towards.  Education on having realistic goals (she reports wanting to walk a mile) and the fluctuating nature of MS.  Pt will benefit from skilled OP neuro PT in order to address deficits.      OBJECTIVE IMPAIRMENTS Abnormal gait, decreased activity tolerance, decreased balance, decreased cognition, decreased coordination, decreased endurance, decreased knowledge of use of DME, decreased mobility, difficulty walking, decreased strength, decreased safety awareness, impaired perceived functional ability, impaired UE functional use, and postural dysfunction.   ACTIVITY LIMITATIONS cleaning, community activity, driving, meal prep, and laundry.   PERSONAL FACTORS Time since onset of injury/illness/exacerbation and 3+ comorbidities: MS, OA, anxiety/depression  are also affecting patient's functional outcome.    REHAB POTENTIAL: Fair    CLINICAL DECISION MAKING: Evolving/moderate complexity  EVALUATION COMPLEXITY: Moderate   GOALS: Goals reviewed with patient? Yes  SHORT TERM GOALS:  STG Name Target Date Goal status  1 Pt/caregiver will be IND with HEP in order to indicate improved functional mobility and dec fall risk.  Baseline:   03/04/2022 INITIAL  2 Pt will perform sit<>stand to RW at min A level in order to reduce burden of care and make more IND at home.  Baseline:  03/04/2022 INITIAL  3 Pt will perform stand pivot transfers both directions with RW at min A level in order to indicate improved functional mobility.  Baseline: 03/04/2022 INITIAL  4 Pt will perform dynamic standing with single UE support x 5 mins at min A in order to complete ADLs at home.  Baseline: 03/04/2022 INITIAL  5 Pt will ambulate x 20' with RW and mod A in order to indicate improved functional mobility.  Baseline: 03/04/2022 INITIAL   LONG TERM GOALS:   LTG Name Target Date Goal status  1 Pt/caregiver will be IND with final HEP in order to indicate improved functional mobility and dec fall risk.  Baseline: 04/01/2022 INITIAL  2 Pt will perform sit<>stand to RW at S level in order to dec burden of care and improve functional mobility.  Baseline: 04/01/2022 INITIAL  3 Pt will perform stand pivot transfers with RW at S level in order to indicate improved functional mobility.  Baseline: 04/01/2022 INITIAL  4 Pt will tolerate standing x 10 mins with single UE support (alt) at S level in order to improve standing tolerance, strength and functional mobility.   Baseline: 04/01/2022 INITIAL  5 Pt will ambulate x 30' with RW at min A level in order to indicate improved functional mobility.  Baseline: 04/01/2022 INITIAL   PLAN: PT FREQUENCY: 2x/week  PT DURATION: 8 weeks  PLANNED INTERVENTIONS: Therapeutic exercises, Therapeutic activity, Neuromuscular re-education, Balance training, Gait training, Patient/Family education, DME instructions, and Aquatic Therapy  PLAN FOR NEXT SESSION: Initiate HEP for BLE strength (will likely  need to be supine), sit<>stand to RW, stand pivot transfers with RW, gait as able.  We talked about trying an AFO on RLE, however I do worry about her swelling so she said she would try to get her compression stockings on.      Harriet Butte, PT, MPT [facility] [address] [place], [place], [address] Phone: [phone]   Fax:  [phone] 02/04/22, 1:39 PM

## 2022-02-05 DIAGNOSIS — G35 Multiple sclerosis: Secondary | ICD-10-CM | POA: Diagnosis not present

## 2022-02-06 DIAGNOSIS — G35 Multiple sclerosis: Secondary | ICD-10-CM | POA: Diagnosis not present

## 2022-02-09 ENCOUNTER — Other Ambulatory Visit: Payer: Self-pay

## 2022-02-09 ENCOUNTER — Ambulatory Visit: Payer: Medicare HMO | Admitting: Physical Therapy

## 2022-02-09 DIAGNOSIS — R296 Repeated falls: Secondary | ICD-10-CM | POA: Diagnosis not present

## 2022-02-09 DIAGNOSIS — R2681 Unsteadiness on feet: Secondary | ICD-10-CM | POA: Diagnosis not present

## 2022-02-09 DIAGNOSIS — G35 Multiple sclerosis: Secondary | ICD-10-CM | POA: Diagnosis not present

## 2022-02-09 DIAGNOSIS — M6281 Muscle weakness (generalized): Secondary | ICD-10-CM

## 2022-02-09 DIAGNOSIS — R2689 Other abnormalities of gait and mobility: Secondary | ICD-10-CM | POA: Diagnosis not present

## 2022-02-09 DIAGNOSIS — R293 Abnormal posture: Secondary | ICD-10-CM | POA: Diagnosis not present

## 2022-02-09 NOTE — Patient Instructions (Addendum)
Access Code: [email or web address] ?URL: [email or web address] ?Date: 02/09/2022 ?Prepared by: Mickie Bail Cashus Halterman ?  ?Exercises ?Seated March - 1 x daily - 7 x weekly - 3 sets - 10 reps ?Seated knee extension - 1 x daily - 7 x weekly - 3 sets - 10 reps - 3s hold ?Seated Hip Adduction Squeeze with Ball - 1 x daily - 7 x weekly - 3 sets - 10 reps - 3s hold ?Seated Hip Abduction - 1 x daily - 7 x weekly - 3 sets - 10 reps - 3s hold ?Seated Gluteal Sets - 1 x daily - 7 x weekly - 3 sets - 10 reps - 5s hold ?

## 2022-02-09 NOTE — Therapy (Signed)
?OUTPATIENT PHYSICAL THERAPY TREATMENT NOTE ? ? ?Patient Name: Ebony Warren ?MRN: [id number] ?DOB:1976-05-19, 46 y.o., female ?Today's Date: 02/09/2022 ? ?PCP: Mike Craze, DO ?REFERRING PROVIDER: Angelica Pou, MD  ? ? PT End of Session - 02/09/22 1504   ? ? Visit Number 2   ? Number of Visits 17   ? Date for PT Re-Evaluation 04/05/22   ? Authorization Type Aetna Medicare/Medicaid   ? Progress Note Due on Visit 10   ? PT Start Time 1415   missed 15 minutes due to pt using restroom  ? PT Stop Time [id number]   ? PT Time Calculation (min) 32 min   ? Equipment Utilized During Treatment Gait belt   ? Activity Tolerance Patient tolerated treatment well;Patient limited by fatigue   ? Behavior During Therapy [facility] for tasks assessed/performed   ? ?  ?  ? ?  ? ? ?Past Medical History:  ?Diagnosis Date  ? Anxiety and depression   ? Arthritis   ? Bradycardia   ? Encounter to establish care 01/22/2021  ? Family history of adverse reaction to anesthesia   ? nausea  ? GERD (gastroesophageal reflux disease)   ? Headache   ? Multiple sclerosis ([place])   ? Screening mammogram, encounter for 01/22/2021  ? Seizures ([place])   ? told that they are not from the brain but stress related  ? ?Past Surgical History:  ?Procedure Laterality Date  ? BREAST REDUCTION SURGERY Bilateral 06/23/2021  ? Procedure: MAMMARY REDUCTION  (BREAST);  Surgeon: Cindra Presume, MD;  Location: [place];  Service: Plastics;  Laterality: Bilateral;  ? CHOLECYSTECTOMY    ? KNEE SURGERY Bilateral   ? Arthroscopy  ? ?Patient Active Problem List  ? Diagnosis Date Noted  ? Fall 01/09/2022  ? Cramp and spasm 08/12/2021  ? Restless leg syndrome 08/12/2021  ? Healthcare maintenance 08/12/2021  ? Macromastia 06/23/2021  ? Gastroesophageal reflux disease 01/22/2021  ? COVID-19 virus infection 01/22/2021  ? Morbid obesity ([place]) 01/22/2021  ? Right optic neuritis 05/13/2016  ? Slurred speech 01/27/2016  ? Other fatigue 01/27/2016  ? Depression 01/27/2016  ? Absence  of bladder continence 01/27/2016  ? Multiple sclerosis ([place]) 10/31/2014  ? Hemiplegia, unspecified, affecting dominant side 08/30/2014  ? ? ?REFERRING DIAG: W19.Merril Abbe (ICD-10-CM) - Fall, initial encounter   ? ?THERAPY DIAG:  ?Unsteadiness on feet ? ?Muscle weakness (generalized) ? ?Other abnormalities of gait and mobility ? ?PERTINENT HISTORY: MS, OA, anxiety/depression  ? ?PRECAUTIONS: Fall ? ?SUBJECTIVE: No new changes. Pt requested to use bathroom at beginning of session and missed 15 minutes of skilled therapy  ? ?PAIN:  ?Are you having pain? No ? ? ?TODAY'S TREATMENT:  ?Transfers: Pt performed sit <>stand pivot from power chair to mat w/RW and min A x2 for safety. Mod verbal cues for proper hand placement and sequencing  ? ?Established and demonstrated HEP as detailed below. Min tactile cues provided throughout for increased quad/glute activation of RLE throughout  ? ?Sit <>stand practice from mat to RW w/mod A x1, 3 reps total. Mod verbal cues for proper hand placement (RUE on RW, LLE on mat) and mod tactile cues for glute activation and full trunk extension. Pt able to hold stand for 10-15s each, heavy reliance on BUEs. Min-mod A for stand <>sit due to poor eccentric control, min verbal cues for hand placement. On final stand, pt performed stand pivot to power chair on L side w/RW and min A.  ? ?  ?PATIENT  EDUCATION: ?Education details: Initial HEP, safety with transfers at home ?Person educated: Patient and Caregiver ?Education method: Explanation, Handout and Verbal cues ?Education comprehension: verbalized understanding and needs further education ?  ?  ?HOME EXERCISE PROGRAM: ?Access Code: [email or web address] ?URL: [email or web address] ?Date: 02/09/2022 ?Prepared by: Mickie Bail Nellie Pester ? ?Exercises ?Seated March - 1 x daily - 7 x weekly - 3 sets - 10 reps ?Seated knee extension - 1 x daily - 7 x weekly - 3 sets - 10 reps - 3s hold ?Seated Hip Adduction Squeeze with Ball - 1 x daily - 7 x weekly - 3 sets -  10 reps - 3s hold ?Seated Hip Abduction - 1 x daily - 7 x weekly - 3 sets - 10 reps - 3s hold ?Seated Gluteal Sets - 1 x daily - 7 x weekly - 3 sets - 10 reps - 5s hold ? ?  ?ASSESSMENT: ?  ?CLINICAL IMPRESSION: ?Emphasis of skilled session o establishing initial HEP and transfers. Pt requested to use restroom at beginning of session, resulting in 15 minutes of missed time. Pt performed sit <>stand pivot w/RW and min guard of 2 for added safety. Pt demonstrated increased muscle activation of RLE today compared to previous session. Continue POC.  ?  ?  ?OBJECTIVE IMPAIRMENTS Abnormal gait, decreased activity tolerance, decreased balance, decreased cognition, decreased coordination, decreased endurance, decreased knowledge of use of DME, decreased mobility, difficulty walking, decreased strength, decreased safety awareness, impaired perceived functional ability, impaired UE functional use, and postural dysfunction.  ?  ?ACTIVITY LIMITATIONS cleaning, community activity, driving, meal prep, and laundry.  ?  ?PERSONAL FACTORS Time since onset of injury/illness/exacerbation and 3+ comorbidities: MS, OA, anxiety/depression  are also affecting patient's functional outcome.  ?  ?  ?REHAB POTENTIAL: Fair   ?  ?CLINICAL DECISION MAKING: Evolving/moderate complexity ?  ?EVALUATION COMPLEXITY: Moderate ?  ?  ?GOALS: ?Goals reviewed with patient? Yes ?  ?SHORT TERM GOALS: ?  ?STG Name Target Date Goal status  ?1 Pt/caregiver will be IND with HEP in order to indicate improved functional mobility and dec fall risk. ?  ?Baseline:  03/04/2022 INITIAL  ?2 Pt will perform sit<>stand to RW at min A level in order to reduce burden of care and make more IND at home.  ?Baseline:  03/04/2022 INITIAL  ?3 Pt will perform stand pivot transfers both directions with RW at min A level in order to indicate improved functional mobility.  ?Baseline: 03/04/2022 INITIAL  ?4 Pt will perform dynamic standing with single UE support x 5 mins at min A in  order to complete ADLs at home.  ?Baseline: 03/04/2022 INITIAL  ?5 Pt will ambulate x 20' with RW and mod A in order to indicate improved functional mobility.  ?Baseline: 03/04/2022 INITIAL  ?  ?LONG TERM GOALS:  ?  ?LTG Name Target Date Goal status  ?1 Pt/caregiver will be IND with final HEP in order to indicate improved functional mobility and dec fall risk. ?  ?Baseline: 04/01/2022 INITIAL  ?2 Pt will perform sit<>stand to RW at S level in order to dec burden of care and improve functional mobility.  ?Baseline: 04/01/2022 INITIAL  ?3 Pt will perform stand pivot transfers with RW at S level in order to indicate improved functional mobility.  ?Baseline: 04/01/2022 INITIAL  ?4 Pt will tolerate standing x 10 mins with single UE support (alt) at S level in order to improve standing tolerance, strength and functional mobility.   ?Baseline: 04/01/2022 INITIAL  ?5 Pt will ambulate  x 30' with RW at min A level in order to indicate improved functional mobility.  ?Baseline: 04/01/2022 INITIAL  ?  ?PLAN: ?PT FREQUENCY: 2x/week ?  ?PT DURATION: 8 weeks ?  ?PLANNED INTERVENTIONS: Therapeutic exercises, Therapeutic activity, Neuromuscular re-education, Balance training, Gait training, Patient/Family education, DME instructions, and Aquatic Therapy ?  ?PLAN FOR NEXT SESSION: How is HEP? Sit <>stands w/RW, BLE strength, pre-gait training, AFO for RLE? Worried about swelling on RLE ? ?  ? ?Charlett Nose, PT, DPT ?02/09/2022, 3:07 PM ? ?   ?

## 2022-02-10 DIAGNOSIS — G35 Multiple sclerosis: Secondary | ICD-10-CM | POA: Diagnosis not present

## 2022-02-11 ENCOUNTER — Ambulatory Visit: Payer: Medicare HMO | Admitting: Physical Therapy

## 2022-02-11 ENCOUNTER — Other Ambulatory Visit: Payer: Self-pay

## 2022-02-11 DIAGNOSIS — G35 Multiple sclerosis: Secondary | ICD-10-CM | POA: Diagnosis not present

## 2022-02-11 DIAGNOSIS — R296 Repeated falls: Secondary | ICD-10-CM | POA: Diagnosis not present

## 2022-02-11 DIAGNOSIS — R2689 Other abnormalities of gait and mobility: Secondary | ICD-10-CM

## 2022-02-11 DIAGNOSIS — R2681 Unsteadiness on feet: Secondary | ICD-10-CM

## 2022-02-11 DIAGNOSIS — M6281 Muscle weakness (generalized): Secondary | ICD-10-CM | POA: Diagnosis not present

## 2022-02-11 DIAGNOSIS — R293 Abnormal posture: Secondary | ICD-10-CM | POA: Diagnosis not present

## 2022-02-11 NOTE — Therapy (Signed)
?OUTPATIENT PHYSICAL THERAPY TREATMENT NOTE ? ? ?Patient Name: Ebony Warren ?MRN: [id number] ?DOB:May 31, 1976, 46 y.o., female ?Today's Date: 02/11/2022 ? ?PCP: Jaci Standard, DO ?REFERRING PROVIDER: Miguel Aschoff, MD  ? ? PT End of Session - 02/11/22 1544   ? ? Visit Number 3   ? Number of Visits 17   ? Date for PT Re-Evaluation 04/05/22   ? Authorization Type Aetna Medicare/Medicaid   ? Progress Note Due on Visit 10   ? PT Start Time 1400   ? PT Stop Time 1440   ? PT Time Calculation (min) 40 min   ? Equipment Utilized During Treatment Gait belt   ? Activity Tolerance Patient tolerated treatment well;Patient limited by fatigue   ? Behavior During Therapy [facility] for tasks assessed/performed   ? ?  ?  ? ?  ? ? ? ?Past Medical History:  ?Diagnosis Date  ? Anxiety and depression   ? Arthritis   ? Bradycardia   ? Encounter to establish care 01/22/2021  ? Family history of adverse reaction to anesthesia   ? nausea  ? GERD (gastroesophageal reflux disease)   ? Headache   ? Multiple sclerosis ([facility])   ? Screening mammogram, encounter for 01/22/2021  ? Seizures ([facility])   ? told that they are not from the brain but stress related  ? ?Past Surgical History:  ?Procedure Laterality Date  ? BREAST REDUCTION SURGERY Bilateral 06/23/2021  ? Procedure: MAMMARY REDUCTION  (BREAST);  Surgeon: Allena Napoleon, MD;  Location: [facility] OR;  Service: Plastics;  Laterality: Bilateral;  ? CHOLECYSTECTOMY    ? KNEE SURGERY Bilateral   ? Arthroscopy  ? ?Patient Active Problem List  ? Diagnosis Date Noted  ? Fall 01/09/2022  ? Cramp and spasm 08/12/2021  ? Restless leg syndrome 08/12/2021  ? Healthcare maintenance 08/12/2021  ? Macromastia 06/23/2021  ? Gastroesophageal reflux disease 01/22/2021  ? COVID-19 virus infection 01/22/2021  ? Morbid obesity ([facility]) 01/22/2021  ? Right optic neuritis 05/13/2016  ? Slurred speech 01/27/2016  ? Other fatigue 01/27/2016  ? Depression 01/27/2016  ? Absence of bladder continence 01/27/2016  ?  Multiple sclerosis ([facility]) 10/31/2014  ? Hemiplegia, unspecified, affecting dominant side 08/30/2014  ? ? ?REFERRING DIAG: W19.Lorne Skeens (ICD-10-CM) - Fall, initial encounter   ? ?THERAPY DIAG:  ?Unsteadiness on feet ? ?Muscle weakness (generalized) ? ?Other abnormalities of gait and mobility ? ?PERTINENT HISTORY: MS, OA, anxiety/depression  ? ?PRECAUTIONS: Fall ? ?SUBJECTIVE: No new changes. Pt reports exercises are going well and starting to feel easier. Rated fatigue as 5/10.  ? ?PAIN:  ?Are you having pain? Yes   ?NPRS scale: 3/10 ?Pain location: R leg  ?Pain orientation: Right  ?PAIN TYPE: aching ?Pain description: intermittent  ?Aggravating factors: Sitting still  ?Relieving factors: Mobility  ? ? ?TODAY'S TREATMENT:  ?Transfers: Pt performed sit <>stand pivot from power chair to mat w/RW and mod A for assistance w/glute extension and lateral weight shifting to turn. Mod verbal cues for proper hand placement and sequencing.  ? ?Sit <>stand practice from mat to RW w/mod-max A x1, 2 reps total. Mod verbal cues for proper hand placement (RUE on RW, LLE on mat) and mod tactile cues for glute activation and full trunk extension. Pt very fatigued today compared to previous session and required max A to maintain trunk extension in standing. Stand <>sit w/min A and min verbal cues for eccentric control.  ? ?Ther Ex ?Sit <>supine w/mod A for BLE management and the following  exercises were performed for global strength and core stability. Min A for RLE support throughout:  ?-Supine bridges, 2x10 w/3s isometric hold and 2s eccentric  ?-Penguin taps, 2x90s  ?-Chest press w/3# Dbs, 2x12 ? ?Supine <>sit w/mod A for RLE and trunk management and sit <>stand pivot w/mod A w/RW to power chair.  ? ?  ?PATIENT EDUCATION: ?Education details: continuing HEP, energy conservation techniques at home ?Person educated: Patient  ?Education method: Explanation, ?Education comprehension: verbalized understanding and needs further education ?  ?   ?HOME EXERCISE PROGRAM: ?Access Code: [email or web address] ?URL: [email or web address] ?Date: 02/09/2022 ?Prepared by: Alethia Berthold Bethenny Losee ? ?Exercises ?Seated March - 1 x daily - 7 x weekly - 3 sets - 10 reps ?Seated knee extension - 1 x daily - 7 x weekly - 3 sets - 10 reps - 3s hold ?Seated Hip Adduction Squeeze with Ball - 1 x daily - 7 x weekly - 3 sets - 10 reps - 3s hold ?Seated Hip Abduction - 1 x daily - 7 x weekly - 3 sets - 10 reps - 3s hold ?Seated Gluteal Sets - 1 x daily - 7 x weekly - 3 sets - 10 reps - 5s hold ? ?  ?ASSESSMENT: ?  ?CLINICAL IMPRESSION: ?Emphasis of skilled PT session on transfers and global strengthening. Pt reported increased fatigue today and required mod-max A for transfers, so remainder of session spent on supine strengthening. Pt continues to demonstrate the greatest weakness in glutes, external rotators and R quads. Continue POC. ?  ?  ?OBJECTIVE IMPAIRMENTS Abnormal gait, decreased activity tolerance, decreased balance, decreased cognition, decreased coordination, decreased endurance, decreased knowledge of use of DME, decreased mobility, difficulty walking, decreased strength, decreased safety awareness, impaired perceived functional ability, impaired UE functional use, and postural dysfunction.  ?  ?ACTIVITY LIMITATIONS cleaning, community activity, driving, meal prep, and laundry.  ?  ?PERSONAL FACTORS Time since onset of injury/illness/exacerbation and 3+ comorbidities: MS, OA, anxiety/depression  are also affecting patient's functional outcome.  ?  ?  ?  ?GOALS: ?Goals reviewed with patient? Yes ?  ?SHORT TERM GOALS: ?  ?STG Name Target Date Goal status  ?1 Pt/caregiver will be IND with HEP in order to indicate improved functional mobility and dec fall risk. ?  ?Baseline:  03/04/2022 INITIAL  ?2 Pt will perform sit<>stand to RW at min A level in order to reduce burden of care and make more IND at home.  ?Baseline:  03/04/2022 INITIAL  ?3 Pt will perform stand pivot transfers  both directions with RW at min A level in order to indicate improved functional mobility.  ?Baseline: 03/04/2022 INITIAL  ?4 Pt will perform dynamic standing with single UE support x 5 mins at min A in order to complete ADLs at home.  ?Baseline: 03/04/2022 INITIAL  ?5 Pt will ambulate x 20' with RW and mod A in order to indicate improved functional mobility.  ?Baseline: 03/04/2022 INITIAL  ?  ?LONG TERM GOALS:  ?  ?LTG Name Target Date Goal status  ?1 Pt/caregiver will be IND with final HEP in order to indicate improved functional mobility and dec fall risk. ?  ?Baseline: 04/01/2022 INITIAL  ?2 Pt will perform sit<>stand to RW at S level in order to dec burden of care and improve functional mobility.  ?Baseline: 04/01/2022 INITIAL  ?3 Pt will perform stand pivot transfers with RW at S level in order to indicate improved functional mobility.  ?Baseline: 04/01/2022 INITIAL  ?4 Pt will tolerate standing x 10 mins with single  UE support (alt) at S level in order to improve standing tolerance, strength and functional mobility.   ?Baseline: 04/01/2022 INITIAL  ?5 Pt will ambulate x 30' with RW at min A level in order to indicate improved functional mobility.  ?Baseline: 04/01/2022 INITIAL  ?  ?PLAN: ?PT FREQUENCY: 2x/week ?  ?PT DURATION: 8 weeks ?  ?PLANNED INTERVENTIONS: Therapeutic exercises, Therapeutic activity, Neuromuscular re-education, Balance training, Gait training, Patient/Family education, DME instructions, and Aquatic Therapy ?  ?PLAN FOR NEXT SESSION: Sit <>stands w/RW, pre-gait training in // bars if pt can tolerate, SciFit, BLE strength   ? ?  ? ?Jill Alexanders Chaneka Trefz, PT, DPT ?02/11/2022, 3:47 PM ? ?   ?

## 2022-02-12 DIAGNOSIS — G35 Multiple sclerosis: Secondary | ICD-10-CM | POA: Diagnosis not present

## 2022-02-13 DIAGNOSIS — G35 Multiple sclerosis: Secondary | ICD-10-CM | POA: Diagnosis not present

## 2022-02-13 DIAGNOSIS — M792 Neuralgia and neuritis, unspecified: Secondary | ICD-10-CM | POA: Diagnosis not present

## 2022-02-13 DIAGNOSIS — Z7409 Other reduced mobility: Secondary | ICD-10-CM | POA: Diagnosis not present

## 2022-02-13 DIAGNOSIS — R32 Unspecified urinary incontinence: Secondary | ICD-10-CM | POA: Diagnosis not present

## 2022-02-13 DIAGNOSIS — K219 Gastro-esophageal reflux disease without esophagitis: Secondary | ICD-10-CM | POA: Diagnosis not present

## 2022-02-13 DIAGNOSIS — R69 Illness, unspecified: Secondary | ICD-10-CM | POA: Diagnosis not present

## 2022-02-13 DIAGNOSIS — M1990 Unspecified osteoarthritis, unspecified site: Secondary | ICD-10-CM | POA: Diagnosis not present

## 2022-02-13 DIAGNOSIS — G8929 Other chronic pain: Secondary | ICD-10-CM | POA: Diagnosis not present

## 2022-02-13 DIAGNOSIS — G40909 Epilepsy, unspecified, not intractable, without status epilepticus: Secondary | ICD-10-CM | POA: Diagnosis not present

## 2022-02-13 DIAGNOSIS — R030 Elevated blood-pressure reading, without diagnosis of hypertension: Secondary | ICD-10-CM | POA: Diagnosis not present

## 2022-02-16 ENCOUNTER — Ambulatory Visit: Payer: Medicare HMO | Admitting: Physical Therapy

## 2022-02-16 DIAGNOSIS — G35 Multiple sclerosis: Secondary | ICD-10-CM | POA: Diagnosis not present

## 2022-02-17 DIAGNOSIS — G35 Multiple sclerosis: Secondary | ICD-10-CM | POA: Diagnosis not present

## 2022-02-18 ENCOUNTER — Ambulatory Visit: Payer: Medicare HMO | Admitting: Physical Therapy

## 2022-02-18 ENCOUNTER — Other Ambulatory Visit: Payer: Self-pay

## 2022-02-18 DIAGNOSIS — R2689 Other abnormalities of gait and mobility: Secondary | ICD-10-CM | POA: Diagnosis not present

## 2022-02-18 DIAGNOSIS — R69 Illness, unspecified: Secondary | ICD-10-CM | POA: Diagnosis not present

## 2022-02-18 DIAGNOSIS — R293 Abnormal posture: Secondary | ICD-10-CM | POA: Diagnosis not present

## 2022-02-18 DIAGNOSIS — G35 Multiple sclerosis: Secondary | ICD-10-CM | POA: Diagnosis not present

## 2022-02-18 DIAGNOSIS — M6281 Muscle weakness (generalized): Secondary | ICD-10-CM | POA: Diagnosis not present

## 2022-02-18 DIAGNOSIS — R2681 Unsteadiness on feet: Secondary | ICD-10-CM | POA: Diagnosis not present

## 2022-02-18 DIAGNOSIS — F419 Anxiety disorder, unspecified: Secondary | ICD-10-CM | POA: Diagnosis not present

## 2022-02-18 DIAGNOSIS — R296 Repeated falls: Secondary | ICD-10-CM | POA: Diagnosis not present

## 2022-02-18 NOTE — Therapy (Signed)
?OUTPATIENT PHYSICAL THERAPY TREATMENT NOTE ? ? ?Patient Name: Ebony Warren ?MRN: [id number] ?DOB:28-Mar-1976, 46 y.o., female ?Today's Date: 02/18/2022 ? ?PCP: Mike Craze, DO ?REFERRING PROVIDER: Angelica Pou, MD  ? ? PT End of Session - 02/18/22 1212   ? ? Visit Number 4   ? Number of Visits 17   ? Date for PT Re-Evaluation 04/05/22   ? Authorization Type Aetna Medicare/Medicaid   ? Progress Note Due on Visit 10   ? PT Start Time 1106   Previous PT session ran late  ? PT Stop Time 1140   Pt requested to leave early 2/2 fatigue  ? PT Time Calculation (min) 34 min   ? Equipment Utilized During Treatment Gait belt   ? Activity Tolerance Patient tolerated treatment well;Patient limited by fatigue   ? Behavior During Therapy [facility] for tasks assessed/performed   ? ?  ?  ? ?  ? ? ? ? ?Past Medical History:  ?Diagnosis Date  ? Anxiety and depression   ? Arthritis   ? Bradycardia   ? Encounter to establish care 01/22/2021  ? Family history of adverse reaction to anesthesia   ? nausea  ? GERD (gastroesophageal reflux disease)   ? Headache   ? Multiple sclerosis ([place])   ? Screening mammogram, encounter for 01/22/2021  ? Seizures ([place])   ? told that they are not from the brain but stress related  ? ?Past Surgical History:  ?Procedure Laterality Date  ? BREAST REDUCTION SURGERY Bilateral 06/23/2021  ? Procedure: MAMMARY REDUCTION  (BREAST);  Surgeon: Cindra Presume, MD;  Location: [place];  Service: Plastics;  Laterality: Bilateral;  ? CHOLECYSTECTOMY    ? KNEE SURGERY Bilateral   ? Arthroscopy  ? ?Patient Active Problem List  ? Diagnosis Date Noted  ? Fall 01/09/2022  ? Cramp and spasm 08/12/2021  ? Restless leg syndrome 08/12/2021  ? Healthcare maintenance 08/12/2021  ? Macromastia 06/23/2021  ? Gastroesophageal reflux disease 01/22/2021  ? COVID-19 virus infection 01/22/2021  ? Morbid obesity ([place]) 01/22/2021  ? Right optic neuritis 05/13/2016  ? Slurred speech 01/27/2016  ? Other fatigue 01/27/2016  ?  Depression 01/27/2016  ? Absence of bladder continence 01/27/2016  ? Multiple sclerosis ([place]) 10/31/2014  ? Hemiplegia, unspecified, affecting dominant side 08/30/2014  ? ? ?REFERRING DIAG: W19.Merril Abbe (ICD-10-CM) - Fall, initial encounter   ? ?THERAPY DIAG:  ?Muscle weakness (generalized) ? ?Other abnormalities of gait and mobility ? ?Unsteadiness on feet ? ?PERTINENT HISTORY: MS, OA, anxiety/depression  ? ?PRECAUTIONS: Fall ? ?SUBJECTIVE: No new changes. Pt reports exercises are going well and starting to feel easier, marches are still a challenge. Rated fatigue as 3/10.  ? ?PAIN:  ?Are you having pain? No   ? ? ?TODAY'S TREATMENT:  ?Transfers: Pt performed sit <>stand pivot from power chair to mat w/RW and min A for assistance w/glute extension and lateral weight shifting to turn. Min verbal cues for proper hand placement and sequencing.  ? ?Sit <>stands throughout session from mat to RW w/mod A, min verbal cues for proper hand placement (RUE on RW, LLE on mat) and proper foot placement. Stand <>sits w/min A and min verbal cues for eccentric control.  ? ?Ther Ex ?-Standing alt. Ipsilateral Gumdrop taps w/BUE support on RW and min A for AD management and upright posture, x4 taps per side for lateral weight shifting and pre-gait training  ?-progressed to cross-body taps to gumdrops, x10 per side. Min A to maintain trunk extension and  min verbal cues to bear weight through BLEs rather than BUEs.  ?-Added seated march overs to HEP to replace seated marches and seated hip abduction, no picture on Medbridge ? ?Gait training ?Gait pattern: step through pattern, decreased stride length, decreased ankle dorsiflexion- Right, decreased ankle dorsiflexion- Left, trunk flexed, poor foot clearance- Right, and poor foot clearance- Left ?Distance walked: 76' ?Assistive device utilized: Environmental consultant - 2 wheeled ?Level of assistance: Min A w/ close power chair follow for safety ?Comments: Pt very motivated to walk and demonstrates slow  cadence and intermediate between step-to and step-through pattern. Min tactile cues provided to anterior shoulder and glutes to facilitate trunk extension as pt relies heavily on BUEs. ? ? ?  ?PATIENT EDUCATION: ?Education details: Updates to HEP ?Person educated: Patient  ?Education method: Explanation, ?Education comprehension: verbalized understanding and needs further education ?  ?  ?HOME EXERCISE PROGRAM: ?Access Code: [email or web address] ?URL: [email or web address] ?Date: 02/09/2022 ?Prepared by: Mickie Bail Bailie Christenbury ? ?Exercises ?Seated March - 1 x daily - 7 x weekly - 3 sets - 10 reps ?Seated knee extension - 1 x daily - 7 x weekly - 3 sets - 10 reps - 3s hold ?Seated Hip Adduction Squeeze with Ball - 1 x daily - 7 x weekly - 3 sets - 10 reps - 3s hold ?Seated Hip Abduction - 1 x daily - 7 x weekly - 3 sets - 10 reps - 3s hold ?Seated Gluteal Sets - 1 x daily - 7 x weekly - 3 sets - 10 reps - 5s hold ? ?  ?ASSESSMENT: ?  ?CLINICAL IMPRESSION: ?Emphasis of skilled PT session on BLE strength and gait training. Pt able to weight shift laterally today w/adequate foot clearance to begin gait training. Pt ambulated 24' slowly w/heavy reliance of BUEs on RW but maintained adequate step clearance/length throughout. Pt reported 8/10 fatigue following gait training and requested to end session early. Plan to update HEP next session to incorporate weightbearing exercise. Continue POC.  ?  ?  ?OBJECTIVE IMPAIRMENTS Abnormal gait, decreased activity tolerance, decreased balance, decreased cognition, decreased coordination, decreased endurance, decreased knowledge of use of DME, decreased mobility, difficulty walking, decreased strength, decreased safety awareness, impaired perceived functional ability, impaired UE functional use, and postural dysfunction.  ?  ?ACTIVITY LIMITATIONS cleaning, community activity, driving, meal prep, and laundry.  ?  ?PERSONAL FACTORS Time since onset of injury/illness/exacerbation and 3+  comorbidities: MS, OA, anxiety/depression  are also affecting patient's functional outcome.  ?  ?  ?  ?GOALS: ?Goals reviewed with patient? Yes ?  ?SHORT TERM GOALS: ?  ?STG Name Target Date Goal status  ?1 Pt/caregiver will be IND with HEP in order to indicate improved functional mobility and dec fall risk. ?  ?Baseline:  03/04/2022 INITIAL  ?2 Pt will perform sit<>stand to RW at min A level in order to reduce burden of care and make more IND at home.  ?Baseline:  03/04/2022 INITIAL  ?3 Pt will perform stand pivot transfers both directions with RW at min A level in order to indicate improved functional mobility.  ?Baseline: 03/04/2022 INITIAL  ?4 Pt will perform dynamic standing with single UE support x 5 mins at min A in order to complete ADLs at home.  ?Baseline: 03/04/2022 INITIAL  ?5 Pt will ambulate x 20' with RW and mod A in order to indicate improved functional mobility.  ?Baseline: 03/04/2022 INITIAL  ?  ?LONG TERM GOALS:  ?  ?LTG Name Target Date Goal status  ?1 Pt/caregiver will be  IND with final HEP in order to indicate improved functional mobility and dec fall risk. ?  ?Baseline: 04/01/2022 INITIAL  ?2 Pt will perform sit<>stand to RW at S level in order to dec burden of care and improve functional mobility.  ?Baseline: 04/01/2022 INITIAL  ?3 Pt will perform stand pivot transfers with RW at S level in order to indicate improved functional mobility.  ?Baseline: 04/01/2022 INITIAL  ?4 Pt will tolerate standing x 10 mins with single UE support (alt) at S level in order to improve standing tolerance, strength and functional mobility.   ?Baseline: 04/01/2022 INITIAL  ?5 Pt will ambulate x 30' with RW at min A level in order to indicate improved functional mobility.  ?Baseline: 04/01/2022 INITIAL  ?  ?PLAN: ?PT FREQUENCY: 2x/week ?  ?PT DURATION: 8 weeks ?  ?PLANNED INTERVENTIONS: Therapeutic exercises, Therapeutic activity, Neuromuscular re-education, Balance training, Gait training, Patient/Family education, DME  instructions, and Aquatic Therapy ?  ?PLAN FOR NEXT SESSION: Update HEP (standing), continued gait training, cone/step taps, SciFit ? ?  ? ?Cruzita Lederer Darden Flemister, PT, DPT ?02/18/2022, 12:14 PM ? ?   ?

## 2022-02-19 DIAGNOSIS — G35 Multiple sclerosis: Secondary | ICD-10-CM | POA: Diagnosis not present

## 2022-02-20 DIAGNOSIS — G35 Multiple sclerosis: Secondary | ICD-10-CM | POA: Diagnosis not present

## 2022-02-23 ENCOUNTER — Other Ambulatory Visit: Payer: Self-pay

## 2022-02-23 ENCOUNTER — Ambulatory Visit: Payer: Medicare HMO | Admitting: Physical Therapy

## 2022-02-23 DIAGNOSIS — R2681 Unsteadiness on feet: Secondary | ICD-10-CM

## 2022-02-23 DIAGNOSIS — G35 Multiple sclerosis: Secondary | ICD-10-CM | POA: Diagnosis not present

## 2022-02-23 DIAGNOSIS — R293 Abnormal posture: Secondary | ICD-10-CM | POA: Diagnosis not present

## 2022-02-23 DIAGNOSIS — R2689 Other abnormalities of gait and mobility: Secondary | ICD-10-CM

## 2022-02-23 DIAGNOSIS — M6281 Muscle weakness (generalized): Secondary | ICD-10-CM

## 2022-02-23 DIAGNOSIS — R296 Repeated falls: Secondary | ICD-10-CM | POA: Diagnosis not present

## 2022-02-23 NOTE — Therapy (Signed)
?OUTPATIENT PHYSICAL THERAPY TREATMENT NOTE ? ? ?Patient Name: Ebony Warren ?MRN: [id number] ?DOB:[birth year], 46 y.o., female ?Today's Date: 02/23/2022 ? ?PCP: Jaci Standard, DO ?REFERRING PROVIDER: Miguel Aschoff, MD  ? ? PT End of Session - 02/23/22 1405   ? ? Visit Number 5   ? Number of Visits 17   ? Date for PT Re-Evaluation 04/05/22   ? Authorization Type A[employer]na Medicare/Medicaid   ? Progress Note Due on Visit 10   ? PT Start Time 1402   ? PT Stop Time 1441   ? PT Time Calculation (min) 39 min   ? Equipment Utilized During Treatment Gait belt   ? Activity Tolerance Patient tolerated treatment well;Patient limited by pain   ? Behavior During Therapy [facility] for tasks assessed/performed   ? ?  ?  ? ?  ? ? ? ? ?Past Medical History:  ?Diagnosis Date  ? Anxiety and depression   ? Arthritis   ? Bradycardia   ? Encounter to establish care 01/22/2021  ? Family history of adverse reaction to anesthesia   ? nausea  ? GERD (gastroesophageal reflux disease)   ? Headache   ? Multiple sclerosis (HCC)   ? Screening mammogram, encounter for 01/22/2021  ? Seizures (HCC)   ? told that they are not from the brain but stress related  ? ?Past Surgical History:  ?Procedure Laterality Date  ? BREAST REDUCTION SURGERY Bilateral 06/23/2021  ? Procedure: MAMMARY REDUCTION  (BREAST);  Surgeon: Allena Napoleon, MD;  Location: [facility] OR;  Service: Plastics;  Laterality: Bilateral;  ? CHOLECYSTECTOMY    ? KNEE SURGERY Bilateral   ? Arthroscopy  ? ?Patient Active Problem List  ? Diagnosis Date Noted  ? Fall 01/09/2022  ? Cramp and spasm 08/12/2021  ? Restless leg syndrome 08/12/2021  ? Healthcare maintenance 08/12/2021  ? Macromastia 06/23/2021  ? Gastroesophageal reflux disease 01/22/2021  ? COVID-19 virus infection 01/22/2021  ? Morbid obesity (HCC) 01/22/2021  ? Right optic neuritis 05/13/2016  ? Slurred speech 01/27/2016  ? Other fatigue 01/27/2016  ? Depression 01/27/2016  ? Absence of bladder continence 01/27/2016  ?  Multiple sclerosis (HCC) 10/31/2014  ? Hemiplegia, unspecified, affecting dominant side 08/30/2014  ? ? ?REFERRING DIAG: W19.Lorne Skeens (ICD-10-CM) - Fall, initial encounter   ? ?THERAPY DIAG:  ?Muscle weakness (generalized) ? ?Other abnormalities of gait and mobility ? ?Unsteadiness on feet ? ?PERTINENT HISTORY: MS, OA, anxiety/depression  ? ?PRECAUTIONS: Fall ? ?SUBJECTIVE: No new changes. Rated fatigue as 1/10, reports exercises are going well  ? ?PAIN:  ?Are you having pain? Yes ?NPRS scale: 5/10 ?Pain location: RLE and L knee  ?PAIN TYPE: aching and sharp ?Pain description: constant  ?   ? ?TODAY'S TREATMENT:  ?Sit <>stands throughout session from power chair to RW w/mod A, min verbal cues for proper foot placement. Stand <>sits w/min A and min verbal cues for eccentric control.  ? ?Ther Ex ?-SciFit level 1 for 5 minutes with BUE/BLE for dynamic cardiovascular warmup and BLE strength.  ? ? ?Gait training ?Gait pattern: step through pattern, decreased stride length, decreased ankle dorsiflexion- Right, decreased ankle dorsiflexion- Left, trunk flexed, poor foot clearance- Right, and poor foot clearance- Left ?Distance walked: 33' and 13' ?Assistive device utilized: Environmental consultant - 2 wheeled ?Level of assistance: Min A w/ close power chair follow for safety ?Comments: Pt limited by pain today and demonstrated increased forward lean and over reliance on BUEs throughout. Pt required short seated rest break due to pain  in BLEs and multiple instances of knee buckling.  ? ?Ther Act  ?-Updated HEP (see below in bold) and educated pt on safe set up at home. Due to fatigue following gait training, pt unable to practice in clinic but able to teach back proper set up and verbalized confidence with her ability to perform with caregiver.  ? ? ?  ?PATIENT EDUCATION: ?Education details: Updates to HEP, safe set up at home ?Person educated: Patient  ?Education method: Explanation, ?Education comprehension: verbalized understanding and needs  further education ?  ?  ?HOME EXERCISE PROGRAM: ?Access Code: [email or web address] ?URL: [email or web address] ?Date: 02/23/2022 ?Prepared by: Alethia Berthold Shanquita Ronning ? ?Exercises ?Seated March - 1 x daily - 7 x weekly - 3 sets - 10 reps ?Seated Hip Adduction Squeeze with Ball - 1 x daily - 7 x weekly - 3 sets - 10 reps - 3s hold ?Mini Squats with Walker and Chair - 1 x daily - 7 x weekly - 2 sets - 5 reps ?Side Stepping with Counter Support - 1 x daily - 7 x weekly - 3 sets - 5 reps ? ? ?  ?ASSESSMENT: ?  ?CLINICAL IMPRESSION: ?Emphasis of skilled PT session on BLE strength, updating HEP and gait training. Pt limited by pain today and relied heavily on BUEs during gait training. Pt continues to be motivated to walk and therapist updated HEP to include more standing exercises to prep for gait training in clinic. Continue POC.  ?  ?  ?OBJECTIVE IMPAIRMENTS Abnormal gait, decreased activity tolerance, decreased balance, decreased cognition, decreased coordination, decreased endurance, decreased knowledge of use of DME, decreased mobility, difficulty walking, decreased strength, decreased safety awareness, impaired perceived functional ability, impaired UE functional use, and postural dysfunction.  ?  ?ACTIVITY LIMITATIONS cleaning, community activity, driving, meal prep, and laundry.  ?  ?PERSONAL FACTORS Time since onset of injury/illness/exacerbation and 3+ comorbidities: MS, OA, anxiety/depression  are also affecting patient's functional outcome.  ?  ?  ?  ?GOALS: ?Goals reviewed with patient? Yes ?  ?SHORT TERM GOALS: ?  ?STG Name Target Date Goal status  ?1 Pt/caregiver will be IND with HEP in order to indicate improved functional mobility and dec fall risk. ?  ?Baseline:  03/04/2022 INITIAL  ?2 Pt will perform sit<>stand to RW at min A level in order to reduce burden of care and make more IND at home.  ?Baseline:  03/04/2022 INITIAL  ?3 Pt will perform stand pivot transfers both directions with RW at min A level in order  to indicate improved functional mobility.  ?Baseline: 03/04/2022 INITIAL  ?4 Pt will perform dynamic standing with single UE support x 5 mins at min A in order to complete ADLs at home.  ?Baseline: 03/04/2022 INITIAL  ?5 Pt will ambulate x 20' with RW and mod A in order to indicate improved functional mobility.  ?Baseline: 03/04/2022 INITIAL  ?  ?LONG TERM GOALS:  ?  ?LTG Name Target Date Goal status  ?1 Pt/caregiver will be IND with final HEP in order to indicate improved functional mobility and dec fall risk. ?  ?Baseline: 04/01/2022 INITIAL  ?2 Pt will perform sit<>stand to RW at S level in order to dec burden of care and improve functional mobility.  ?Baseline: 04/01/2022 INITIAL  ?3 Pt will perform stand pivot transfers with RW at S level in order to indicate improved functional mobility.  ?Baseline: 04/01/2022 INITIAL  ?4 Pt will tolerate standing x 10 mins with single UE support (alt) at S level in order  to improve standing tolerance, strength and functional mobility.   ?Baseline: 04/01/2022 INITIAL  ?5 Pt will ambulate x 30' with RW at min A level in order to indicate improved functional mobility.  ?Baseline: 04/01/2022 INITIAL  ?  ?PLAN: ?PT FREQUENCY: 2x/week ?  ?PT DURATION: 8 weeks ?  ?PLANNED INTERVENTIONS: Therapeutic exercises, Therapeutic activity, Neuromuscular re-education, Balance training, Gait training, Patient/Family education, DME instructions, and Aquatic Therapy ?  ?PLAN FOR NEXT SESSION: How is HEP? Start assessing STGs, gait training, toe taps to step, glute strengthening exercises  ? ?  ? ?Jill Alexanders Ariela Mochizuki, PT, DPT ?02/23/2022, 3:04 PM ? ?   ?

## 2022-02-24 DIAGNOSIS — G35 Multiple sclerosis: Secondary | ICD-10-CM | POA: Diagnosis not present

## 2022-02-25 ENCOUNTER — Other Ambulatory Visit: Payer: Self-pay

## 2022-02-25 ENCOUNTER — Ambulatory Visit: Payer: Medicare HMO | Admitting: Physical Therapy

## 2022-02-25 DIAGNOSIS — R2681 Unsteadiness on feet: Secondary | ICD-10-CM | POA: Diagnosis not present

## 2022-02-25 DIAGNOSIS — R293 Abnormal posture: Secondary | ICD-10-CM | POA: Diagnosis not present

## 2022-02-25 DIAGNOSIS — G35 Multiple sclerosis: Secondary | ICD-10-CM | POA: Diagnosis not present

## 2022-02-25 DIAGNOSIS — M6281 Muscle weakness (generalized): Secondary | ICD-10-CM

## 2022-02-25 DIAGNOSIS — R2689 Other abnormalities of gait and mobility: Secondary | ICD-10-CM | POA: Diagnosis not present

## 2022-02-25 DIAGNOSIS — R296 Repeated falls: Secondary | ICD-10-CM | POA: Diagnosis not present

## 2022-02-25 NOTE — Therapy (Signed)
?OUTPATIENT PHYSICAL THERAPY TREATMENT NOTE ? ? ?Patient Name: Ebony Warren ?MRN: [id number] ?DOB:01/07/1976, 46 y.o., female ?Today's Date: 02/25/2022 ? ?PCP: Mike Craze, DO ?REFERRING PROVIDER: Angelica Pou, MD  ? ? PT End of Session - 02/25/22 1355   ? ? Visit Number 6   ? Number of Visits 17   ? Date for PT Re-Evaluation 04/05/22   ? Authorization Type Aetna Medicare/Medicaid   ? Progress Note Due on Visit 10   ? PT Start Time 1355   ? Equipment Utilized During Treatment Gait belt   ? Activity Tolerance Patient tolerated treatment well;Patient limited by pain   ? Behavior During Therapy [facility] for tasks assessed/performed   ? ?  ?  ? ?  ? ? ? ? ? ?Past Medical History:  ?Diagnosis Date  ? Anxiety and depression   ? Arthritis   ? Bradycardia   ? Encounter to establish care 01/22/2021  ? Family history of adverse reaction to anesthesia   ? nausea  ? GERD (gastroesophageal reflux disease)   ? Headache   ? Multiple sclerosis ([place])   ? Screening mammogram, encounter for 01/22/2021  ? Seizures ([place])   ? told that they are not from the brain but stress related  ? ?Past Surgical History:  ?Procedure Laterality Date  ? BREAST REDUCTION SURGERY Bilateral 06/23/2021  ? Procedure: MAMMARY REDUCTION  (BREAST);  Surgeon: Cindra Presume, MD;  Location: [place];  Service: Plastics;  Laterality: Bilateral;  ? CHOLECYSTECTOMY    ? KNEE SURGERY Bilateral   ? Arthroscopy  ? ?Patient Active Problem List  ? Diagnosis Date Noted  ? Fall 01/09/2022  ? Cramp and spasm 08/12/2021  ? Restless leg syndrome 08/12/2021  ? Healthcare maintenance 08/12/2021  ? Macromastia 06/23/2021  ? Gastroesophageal reflux disease 01/22/2021  ? COVID-19 virus infection 01/22/2021  ? Morbid obesity ([place]) 01/22/2021  ? Right optic neuritis 05/13/2016  ? Slurred speech 01/27/2016  ? Other fatigue 01/27/2016  ? Depression 01/27/2016  ? Absence of bladder continence 01/27/2016  ? Multiple sclerosis ([place]) 10/31/2014  ? Hemiplegia,  unspecified, affecting dominant side 08/30/2014  ? ? ?REFERRING DIAG: W19.Merril Abbe (ICD-10-CM) - Fall, initial encounter   ? ?THERAPY DIAG:  ?Muscle weakness (generalized) ? ?Other abnormalities of gait and mobility ? ?Unsteadiness on feet ? ?PERTINENT HISTORY: MS, OA, anxiety/depression  ? ?PRECAUTIONS: Fall ? ?SUBJECTIVE: Rated fatigue as 2/10, reported she was "too scared" to try new exercises at home due to fear of falling ? ?PAIN:  ?Are you having pain? Yes ?NPRS scale: 5/10 ?Pain location: RLE and L knee  ?PAIN TYPE: aching and sharp ?Pain description: constant  ?   ? ?TODAY'S TREATMENT:  ? ?Ther Ex  ?-Updated HEP (see below in bold) and educated pt on safe set up at home. Pt able to demonstrate exercises well and does not feel unsafe performing at home. Encouraged pt to only perform sit <>stands w/nurse due to pt fear.  ?-Sit <>stands x5 from power chair to RW w/CGA, mod verbal and visual cues for anterior weight shift and proper hand placement. Pt requires extra time for initiation and to achieve full trunk extension but was able to perform transfer without physical assistance for the first time today.  ?-SciFit level 1 with BUEs/BLEs for 90 seconds and BLEs only for 4.5 minutes for BLE strength and endurance. Pt performed 100 steps in 6 minutes.  ? ?Gait training ?Gait pattern:  bilateral genu valgus, step through pattern, decreased stride length, decreased  hip/knee flexion- Right, decreased hip/knee flexion- Left, decreased ankle dorsiflexion- Right, decreased ankle dorsiflexion- Left, trunk flexed, poor foot clearance- Right, and poor foot clearance- Left ?Distance walked:13' x2 ?Assistive device utilized: Environmental consultant - 2 wheeled ?Level of assistance: CGA w/ close power chair follow for safety ?Comments: Pt continues to demonstrate significant forward lead and over-reliance on BUEs, min multimodal cues for upright posture and glute extension ? ? ?  ?PATIENT EDUCATION: ?Education details: Updates to HEP, proper  technique for sit <>stands ?Person educated: Patient  ?Education method: Explanation and handout ?Education comprehension: verbalized understanding and needs further education ?  ?  ?HOME EXERCISE PROGRAM: ?Access Code: [place] ?URL: [email or web address] ?Date: 02/25/2022 ?Prepared by: Mickie Bail Itamar Mcgowan ? ?Exercises ?Seated March - 1 x daily - 7 x weekly - 3 sets - 10 reps ?Seated Hip Adduction Squeeze with Ball - 1 x daily - 7 x weekly - 3 sets - 10 reps - 3s hold ?Seated march with opposite shoulder raise - 1 x daily - 7 x weekly - 3 sets - 10 reps ?Seated Chair Push Ups - 1 x daily - 7 x weekly - 3 sets - 10 reps - 3s hold ?Proper Sit to Stand Technique - 1 x daily - 7 x weekly - 3 sets - 10 reps ? ? ?  ?ASSESSMENT: ?  ?CLINICAL IMPRESSION: ?Emphasis of skilled PT session on BLE strength, updating HEP and gait training. Pt continues to rely heavily on BUEs during gait and requires max multimodal cues for upright posture. Pt responded well to visual biofeedback during gait and was able to maintain upright posture without assistance. Continue POC.  ?  ?  ?OBJECTIVE IMPAIRMENTS Abnormal gait, decreased activity tolerance, decreased balance, decreased cognition, decreased coordination, decreased endurance, decreased knowledge of use of DME, decreased mobility, difficulty walking, decreased strength, decreased safety awareness, impaired perceived functional ability, impaired UE functional use, and postural dysfunction.  ?  ?ACTIVITY LIMITATIONS cleaning, community activity, driving, meal prep, and laundry.  ?  ?PERSONAL FACTORS Time since onset of injury/illness/exacerbation and 3+ comorbidities: MS, OA, anxiety/depression  are also affecting patient's functional outcome.  ?  ?  ?  ?GOALS: ?Goals reviewed with patient? Yes ?  ?SHORT TERM GOALS: ?  ?STG Name Target Date Goal status  ?1 Pt/caregiver will be IND with HEP in order to indicate improved functional mobility and dec fall risk. ?  ?Baseline:   03/04/2022 INITIAL  ?2 Pt will perform sit<>stand to RW at min A level in order to reduce burden of care and make more IND at home.  ?Baseline:  03/04/2022 INITIAL  ?3 Pt will perform stand pivot transfers both directions with RW at min A level in order to indicate improved functional mobility.  ?Baseline: 03/04/2022 INITIAL  ?4 Pt will perform dynamic standing with single UE support x 5 mins at min A in order to complete ADLs at home.  ?Baseline: 03/04/2022 INITIAL  ?5 Pt will ambulate x 20' with RW and mod A in order to indicate improved functional mobility.  ?Baseline: 03/04/2022 INITIAL  ?  ?LONG TERM GOALS:  ?  ?LTG Name Target Date Goal status  ?1 Pt/caregiver will be IND with final HEP in order to indicate improved functional mobility and dec fall risk. ?  ?Baseline: 04/01/2022 INITIAL  ?2 Pt will perform sit<>stand to RW at S level in order to dec burden of care and improve functional mobility.  ?Baseline: 04/01/2022 INITIAL  ?3 Pt will perform stand pivot transfers with RW at S level  in order to indicate improved functional mobility.  ?Baseline: 04/01/2022 INITIAL  ?4 Pt will tolerate standing x 10 mins with single UE support (alt) at S level in order to improve standing tolerance, strength and functional mobility.   ?Baseline: 04/01/2022 INITIAL  ?5 Pt will ambulate x 30' with RW at min A level in order to indicate improved functional mobility.  ?Baseline: 04/01/2022 INITIAL  ?  ?PLAN: ?PT FREQUENCY: 2x/week ?  ?PT DURATION: 8 weeks ?  ?PLANNED INTERVENTIONS: Therapeutic exercises, Therapeutic activity, Neuromuscular re-education, Balance training, Gait training, Patient/Family education, DME instructions, and Aquatic Therapy ?  ?PLAN FOR NEXT SESSION: How is HEP? Start assessing STGs, gait training, toe taps to step, glute strengthening exercises, SciFit w/BLEs only  ? ?  ? ?Cruzita Lederer Averianna Brugger, PT, DPT ?02/25/2022, 1:56 PM ? ?   ?

## 2022-02-26 DIAGNOSIS — G35 Multiple sclerosis: Secondary | ICD-10-CM | POA: Diagnosis not present

## 2022-02-27 DIAGNOSIS — G35 Multiple sclerosis: Secondary | ICD-10-CM | POA: Diagnosis not present

## 2022-03-02 DIAGNOSIS — G35 Multiple sclerosis: Secondary | ICD-10-CM | POA: Diagnosis not present

## 2022-03-03 ENCOUNTER — Other Ambulatory Visit: Payer: Self-pay

## 2022-03-03 ENCOUNTER — Ambulatory Visit: Payer: Medicare HMO | Admitting: Physical Therapy

## 2022-03-03 DIAGNOSIS — R293 Abnormal posture: Secondary | ICD-10-CM | POA: Diagnosis not present

## 2022-03-03 DIAGNOSIS — M6281 Muscle weakness (generalized): Secondary | ICD-10-CM | POA: Diagnosis not present

## 2022-03-03 DIAGNOSIS — R2689 Other abnormalities of gait and mobility: Secondary | ICD-10-CM

## 2022-03-03 DIAGNOSIS — R2681 Unsteadiness on feet: Secondary | ICD-10-CM

## 2022-03-03 DIAGNOSIS — G35 Multiple sclerosis: Secondary | ICD-10-CM | POA: Diagnosis not present

## 2022-03-03 DIAGNOSIS — R296 Repeated falls: Secondary | ICD-10-CM | POA: Diagnosis not present

## 2022-03-03 NOTE — Therapy (Signed)
?OUTPATIENT PHYSICAL THERAPY TREATMENT NOTE ? ? ?Patient Name: Ebony Warren ?MRN: [id number] ?DOB:[birth year], 46 y.o., female ?Today's Date: 03/03/2022 ? ?PCP: Mike Craze, DO ?REFERRING PROVIDER: Angelica Pou, MD  ? ? PT End of Session - 03/03/22 1624   ? ? Visit Number 7   ? Number of Visits 17   ? Date for PT Re-Evaluation 04/05/22   ? Authorization Type Aetna Medicare/Medicaid   ? Progress Note Due on Visit 10   ? PT Start Time 1408   Previous session ran late  ? PT Stop Time 5625   ? PT Time Calculation (min) 37 min   ? Equipment Utilized During Treatment Gait belt   ? Activity Tolerance Patient tolerated treatment well   ? Behavior During Therapy [facility] for tasks assessed/performed   ? ?  ?  ? ?  ? ? ? ? ? ? ?Past Medical History:  ?Diagnosis Date  ? Anxiety and depression   ? Arthritis   ? Bradycardia   ? Encounter to establish care 01/22/2021  ? Family history of adverse reaction to anesthesia   ? nausea  ? GERD (gastroesophageal reflux disease)   ? Headache   ? Multiple sclerosis ([place])   ? Screening mammogram, encounter for 01/22/2021  ? Seizures ([place])   ? told that they are not from the brain but stress related  ? ?Past Surgical History:  ?Procedure Laterality Date  ? BREAST REDUCTION SURGERY Bilateral 06/23/2021  ? Procedure: MAMMARY REDUCTION  (BREAST);  Surgeon: Cindra Presume, MD;  Location: [place];  Service: Plastics;  Laterality: Bilateral;  ? CHOLECYSTECTOMY    ? KNEE SURGERY Bilateral   ? Arthroscopy  ? ?Patient Active Problem List  ? Diagnosis Date Noted  ? Fall 01/09/2022  ? Cramp and spasm 08/12/2021  ? Restless leg syndrome 08/12/2021  ? Healthcare maintenance 08/12/2021  ? Macromastia 06/23/2021  ? Gastroesophageal reflux disease 01/22/2021  ? COVID-19 virus infection 01/22/2021  ? Morbid obesity ([place]) 01/22/2021  ? Right optic neuritis 05/13/2016  ? Slurred speech 01/27/2016  ? Other fatigue 01/27/2016  ? Depression 01/27/2016  ? Absence of bladder continence 01/27/2016   ? Multiple sclerosis ([place]) 10/31/2014  ? Hemiplegia, unspecified, affecting dominant side 08/30/2014  ? ? ?REFERRING DIAG: W19.Merril Abbe (ICD-10-CM) - Fall, initial encounter   ? ?THERAPY DIAG:  ?Muscle weakness (generalized) ? ?Other abnormalities of gait and mobility ? ?Unsteadiness on feet ? ?PERTINENT HISTORY: MS, OA, anxiety/depression  ? ?PRECAUTIONS: Fall ? ?SUBJECTIVE: Rated fatigue as 0/10. Tried sit <>stands at home w/nurse and reports she was "shaky" but they went well.  ? ?PAIN:  ?Are you having pain? Yes ?NPRS scale: 2/10 ?Pain location: RLE and L knee  ?PAIN TYPE: aching and sharp ?Pain description: constant  ?   ? ?TODAY'S TREATMENT:  ? ?Ther Act  ?Assessed STGs (see below) and discussed updating LTG to reflect progress in PT. Pt currently performing transfers at S* level and is motivated to improve walking distance.  ? ?Gait training ?Gait pattern:  bilateral genu valgus, step through pattern, decreased stride length, decreased hip/knee flexion- Right, decreased hip/knee flexion- Left, decreased ankle dorsiflexion- Right, decreased ankle dorsiflexion- Left, trunk flexed, poor foot clearance- Right, and poor foot clearance- Left ?Distance walked:32' and 22' ?Assistive device utilized: Environmental consultant - 2 wheeled ?Level of assistance: SBA w/ close power chair follow for safety ?Comments: Pt demonstrated improved upright posture and step length, min tactile cues provided to facilitate trunk extension.  ? ? ?  ?PATIENT  EDUCATION: ?Education details: Continuing HEP at home, STG progression ?Person educated: Patient  ?Education method: Explanation and handout ?Education comprehension: verbalized understanding and needs further education ?  ?  ?HOME EXERCISE PROGRAM: ?Access Code: [place] ?URL: [email or web address] ?Date: 02/25/2022 ?Prepared by: Mickie Bail Jakell Trusty ? ?Exercises ?Seated March - 1 x daily - 7 x weekly - 3 sets - 10 reps ?Seated Hip Adduction Squeeze with Ball - 1 x daily - 7 x weekly - 3 sets  - 10 reps - 3s hold ?Seated march with opposite shoulder raise - 1 x daily - 7 x weekly - 3 sets - 10 reps ?Seated Chair Push Ups - 1 x daily - 7 x weekly - 3 sets - 10 reps - 3s hold ?Proper Sit to Stand Technique - 1 x daily - 7 x weekly - 3 sets - 10 reps ? ? ?  ?ASSESSMENT: ?  ?CLINICAL IMPRESSION: ?Emphasis of skilled PT session on STG assessment and gait training. Pt has met 4 of 5 STGs, indicating improved strength with transfers and functional mobility. Pt unable to maintain standing with unilateral UE support for 5 minutes but held for 2 minutes w/intermittent unilateral/BUE support. Pt's LTGs will be updated to reflect pt's progress in therapy. Continue POC.  ?  ?  ?OBJECTIVE IMPAIRMENTS Abnormal gait, decreased activity tolerance, decreased balance, decreased cognition, decreased coordination, decreased endurance, decreased knowledge of use of DME, decreased mobility, difficulty walking, decreased strength, decreased safety awareness, impaired perceived functional ability, impaired UE functional use, and postural dysfunction.  ?  ?ACTIVITY LIMITATIONS cleaning, community activity, driving, meal prep, and laundry.  ?  ?PERSONAL FACTORS Time since onset of injury/illness/exacerbation and 3+ comorbidities: MS, OA, anxiety/depression  are also affecting patient's functional outcome.  ?  ?  ?  ?GOALS: ?Goals reviewed with patient? Yes ?  ?SHORT TERM GOALS: ?  ?STG Name Target Date Goal status  ?1 Pt/caregiver will be IND with HEP in order to indicate improved functional mobility and dec fall risk. ?  ?Baseline:  03/04/2022 MET  ?2 Pt will perform sit<>stand to RW at min A level in order to reduce burden of care and make more IND at home.  ?Baseline:  03/04/2022 MET  ?3 Pt will perform stand pivot transfers both directions with RW at min A level in order to indicate improved functional mobility.  ?Baseline: 03/04/2022 MET  ?4 Pt will perform dynamic standing with single UE support x 5 mins at min A in order to  complete ADLs at home.  ?Baseline: 03/04/2022 Partially met ?93min w/BUE support   ?5 Pt will ambulate x 20' with RW and mod A in order to indicate improved functional mobility.  ?Baseline: 03/04/2022 MET  ?  ?LONG TERM GOALS:  ?  ?LTG Name Target Date Goal status  ?1 Pt/caregiver will be IND with final HEP in order to indicate improved functional mobility and dec fall risk. ?  ?Baseline: 04/01/2022 INITIAL  ?2 Pt will perform sit<>stand to RW at Mod I level in order to dec burden of care and improve functional mobility.  ?Baseline: 04/01/2022 REVISED  ?3 Pt will perform stand pivot transfers with RW at mod I level in order to indicate improved functional mobility.  ?Baseline: 04/01/2022 REVISED  ?4 Pt will tolerate standing x 10 mins with single UE support (alt) at S level in order to improve standing tolerance, strength and functional mobility.   ?Baseline: 04/01/2022 INITIAL  ?5 Pt will ambulate x 50' with RW at min A level in order  to indicate improved functional mobility.  ?Baseline: 04/01/2022 REVISED  ?  ?PLAN: ?PT FREQUENCY: 2x/week ?  ?PT DURATION: 8 weeks ?  ?PLANNED INTERVENTIONS: Therapeutic exercises, Therapeutic activity, Neuromuscular re-education, Balance training, Gait training, Patient/Family education, DME instructions, and Aquatic Therapy ?  ?PLAN FOR NEXT SESSION: How is HEP? gait training, toe taps to step, glute strengthening exercises, SciFit w/BLEs only  ? ?  ? ?Cruzita Lederer Chelse Matas, PT, DPT ?03/03/2022, 4:25 PM ? ?   ?

## 2022-03-04 DIAGNOSIS — G35 Multiple sclerosis: Secondary | ICD-10-CM | POA: Diagnosis not present

## 2022-03-05 ENCOUNTER — Ambulatory Visit: Payer: Medicare HMO | Admitting: Physical Therapy

## 2022-03-05 DIAGNOSIS — G35 Multiple sclerosis: Secondary | ICD-10-CM | POA: Diagnosis not present

## 2022-03-05 DIAGNOSIS — R2689 Other abnormalities of gait and mobility: Secondary | ICD-10-CM

## 2022-03-05 DIAGNOSIS — R2681 Unsteadiness on feet: Secondary | ICD-10-CM

## 2022-03-05 DIAGNOSIS — R293 Abnormal posture: Secondary | ICD-10-CM | POA: Diagnosis not present

## 2022-03-05 DIAGNOSIS — R296 Repeated falls: Secondary | ICD-10-CM | POA: Diagnosis not present

## 2022-03-05 DIAGNOSIS — M6281 Muscle weakness (generalized): Secondary | ICD-10-CM

## 2022-03-05 NOTE — Therapy (Signed)
?OUTPATIENT PHYSICAL THERAPY TREATMENT NOTE ? ? ?Patient Name: Ebony Warren ?MRN: [id number] ?DOB:10/04/1976, 46 y.o., female ?Today's Date: 03/05/2022 ? ?PCP: Mike Craze, DO ?REFERRING PROVIDER: Angelica Pou, MD  ? ? PT End of Session - 03/05/22 1405   ? ? Visit Number 8   ? Number of Visits 17   ? Date for PT Re-Evaluation 04/05/22   ? Authorization Type Aetna Medicare/Medicaid   ? Progress Note Due on Visit 10   ? PT Start Time 1405   ? PT Stop Time 2355   ? PT Time Calculation (min) 40 min   ? Equipment Utilized During Treatment Gait belt   ? Activity Tolerance Patient tolerated treatment well   ? Behavior During Therapy [facility] for tasks assessed/performed   ? ?  ?  ? ?  ? ? ? ? ? ? ?Past Medical History:  ?Diagnosis Date  ? Anxiety and depression   ? Arthritis   ? Bradycardia   ? Encounter to establish care 01/22/2021  ? Family history of adverse reaction to anesthesia   ? nausea  ? GERD (gastroesophageal reflux disease)   ? Headache   ? Multiple sclerosis ([place])   ? Screening mammogram, encounter for 01/22/2021  ? Seizures ([place])   ? told that they are not from the brain but stress related  ? ?Past Surgical History:  ?Procedure Laterality Date  ? BREAST REDUCTION SURGERY Bilateral 06/23/2021  ? Procedure: MAMMARY REDUCTION  (BREAST);  Surgeon: Cindra Presume, MD;  Location: [place];  Service: Plastics;  Laterality: Bilateral;  ? CHOLECYSTECTOMY    ? KNEE SURGERY Bilateral   ? Arthroscopy  ? ?Patient Active Problem List  ? Diagnosis Date Noted  ? Fall 01/09/2022  ? Cramp and spasm 08/12/2021  ? Restless leg syndrome 08/12/2021  ? Healthcare maintenance 08/12/2021  ? Macromastia 06/23/2021  ? Gastroesophageal reflux disease 01/22/2021  ? COVID-19 virus infection 01/22/2021  ? Morbid obesity ([place]) 01/22/2021  ? Right optic neuritis 05/13/2016  ? Slurred speech 01/27/2016  ? Other fatigue 01/27/2016  ? Depression 01/27/2016  ? Absence of bladder continence 01/27/2016  ? Multiple sclerosis ([place])  10/31/2014  ? Hemiplegia, unspecified, affecting dominant side 08/30/2014  ? ? ?REFERRING DIAG: W19.Merril Abbe (ICD-10-CM) - Fall, initial encounter   ? ?THERAPY DIAG:  ?Muscle weakness (generalized) ? ?Other abnormalities of gait and mobility ? ?Unsteadiness on feet ? ?PERTINENT HISTORY: MS, OA, anxiety/depression  ? ?PRECAUTIONS: Fall ? ?SUBJECTIVE: Rated fatigue as 5/10. Exercises are going well, no new changes. ? ?PAIN:  ?Are you having pain? Yes ?NPRS scale: 5/10 ?Pain location: RLE  ?PAIN TYPE: aching and sharp ?Pain description: constant  ?   ? ?TODAY'S TREATMENT:  ? ?Ther Ex  ?SciFit level 1.2 w/BLEs only for 6 minutes for dynamic cardiovascular warmup and BLE strength. Total of 243 steps, compared to 100 steps in 6 minutes in previous session.  ? ?Global strengthening on mat: ?-Supine glute bridges x10, intermittent min A for RLE management. Noted significant drifting to R side w/each bridge  ?-Progressed to bridge w/ball between knees for hip adduction x10, noted decreased R sway with addition of ball  ?-Side pushups, x5 per side for improved oblique strength and unilateral UE strength for tranfers  ?-Seated marches, x10 per side. Noted improved foot clearance of RLE  ? ?Gait training ?Gait pattern:  bilateral genu valgus, step through pattern, decreased stride length, decreased hip/knee flexion- Right, decreased hip/knee flexion- Left, decreased ankle dorsiflexion- Right, decreased ankle dorsiflexion- Left,  trunk flexed, poor foot clearance- Right, and poor foot clearance- Left ?Distance walked: 8' ?Assistive device utilized: Environmental consultant - 2 wheeled ?Level of assistance: SBA w/ close power chair follow for safety ?Comments: Pt too fatigued to continue gait training today and requested to stop.  ? ? ?  ?PATIENT EDUCATION: ?Education details: Continuing HEP at home  ?Person educated: Patient  ?Education method: Explanation and handout ?Education comprehension: verbalized understanding  ?  ?  ?HOME EXERCISE  PROGRAM: ?Access Code: [place] ?URL: [email or web address] ?Date: 02/25/2022 ?Prepared by: Mickie Bail Lakayla Barrington ? ?Exercises ?Seated March - 1 x daily - 7 x weekly - 3 sets - 10 reps ?Seated Hip Adduction Squeeze with Ball - 1 x daily - 7 x weekly - 3 sets - 10 reps - 3s hold ?Seated march with opposite shoulder raise - 1 x daily - 7 x weekly - 3 sets - 10 reps ?Seated Chair Push Ups - 1 x daily - 7 x weekly - 3 sets - 10 reps - 3s hold ?Proper Sit to Stand Technique - 1 x daily - 7 x weekly - 3 sets - 10 reps ? ? ?  ?ASSESSMENT: ?  ?CLINICAL IMPRESSION: ?Emphasis of skilled PT session on BLE strengthening and endurance training. Pt able to perform >250 steps on SciFit today compared to 100 last session. Pt limited by pain and fatigue today and was unable to participate in gait training. Continue POC. ?  ?  ?OBJECTIVE IMPAIRMENTS Abnormal gait, decreased activity tolerance, decreased balance, decreased cognition, decreased coordination, decreased endurance, decreased knowledge of use of DME, decreased mobility, difficulty walking, decreased strength, decreased safety awareness, impaired perceived functional ability, impaired UE functional use, and postural dysfunction.  ?  ?ACTIVITY LIMITATIONS cleaning, community activity, driving, meal prep, and laundry.  ?  ?PERSONAL FACTORS Time since onset of injury/illness/exacerbation and 3+ comorbidities: MS, OA, anxiety/depression  are also affecting patient's functional outcome.  ?  ?  ?  ?GOALS: ?Goals reviewed with patient? Yes ?  ?SHORT TERM GOALS: ?  ?STG Name Target Date Goal status  ?1 Pt/caregiver will be IND with HEP in order to indicate improved functional mobility and dec fall risk. ?  ?Baseline:  03/04/2022 MET  ?2 Pt will perform sit<>stand to RW at min A level in order to reduce burden of care and make more IND at home.  ?Baseline:  03/04/2022 MET  ?3 Pt will perform stand pivot transfers both directions with RW at min A level in order to indicate  improved functional mobility.  ?Baseline: 03/04/2022 MET  ?4 Pt will perform dynamic standing with single UE support x 5 mins at min A in order to complete ADLs at home.  ?Baseline: 03/04/2022 Partially met ?89min w/BUE support   ?5 Pt will ambulate x 20' with RW and mod A in order to indicate improved functional mobility.  ?Baseline: 03/04/2022 MET  ?  ?LONG TERM GOALS:  ?  ?LTG Name Target Date Goal status  ?1 Pt/caregiver will be IND with final HEP in order to indicate improved functional mobility and dec fall risk. ?  ?Baseline: 04/01/2022 INITIAL  ?2 Pt will perform sit<>stand to RW at Mod I level in order to dec burden of care and improve functional mobility.  ?Baseline: 04/01/2022 REVISED  ?3 Pt will perform stand pivot transfers with RW at mod I level in order to indicate improved functional mobility.  ?Baseline: 04/01/2022 REVISED  ?4 Pt will tolerate standing x 5 mins with single UE support (alt) at S level in  order to improve standing tolerance, strength and functional mobility.   ?Baseline: 04/01/2022 REVISED  ?5 Pt will ambulate x 50' with RW at min A level in order to indicate improved functional mobility.  ?Baseline: 04/01/2022 REVISED  ?  ?PLAN: ?PT FREQUENCY: 2x/week ?  ?PT DURATION: 8 weeks ?  ?PLANNED INTERVENTIONS: Therapeutic exercises, Therapeutic activity, Neuromuscular re-education, Balance training, Gait training, Patient/Family education, DME instructions, and Aquatic Therapy ?  ?PLAN FOR NEXT SESSION: How is HEP? gait training, toe taps to step, glute strengthening exercises, SciFit w/BLEs only  ? ?  ? ?Cruzita Lederer Howard Bunte, PT, DPT ?03/05/2022, 4:28 PM ? ?   ?

## 2022-03-06 DIAGNOSIS — G35 Multiple sclerosis: Secondary | ICD-10-CM | POA: Diagnosis not present

## 2022-03-08 DIAGNOSIS — G35 Multiple sclerosis: Secondary | ICD-10-CM | POA: Diagnosis not present

## 2022-03-09 ENCOUNTER — Encounter: Payer: Self-pay | Admitting: Diagnostic Neuroimaging

## 2022-03-09 ENCOUNTER — Other Ambulatory Visit: Payer: Self-pay

## 2022-03-09 ENCOUNTER — Telehealth: Payer: Self-pay | Admitting: Diagnostic Neuroimaging

## 2022-03-09 DIAGNOSIS — G35 Multiple sclerosis: Secondary | ICD-10-CM | POA: Diagnosis not present

## 2022-03-09 MED ORDER — BACLOFEN 20 MG PO TABS: 20.0000 mg | ORAL_TABLET | Freq: Three times a day (TID) | ORAL | 4 refills | Status: DC

## 2022-03-09 MED ORDER — GABAPENTIN 300 MG PO CAPS: 600.0000 mg | ORAL_CAPSULE | Freq: Three times a day (TID) | ORAL | 4 refills | Status: DC

## 2022-03-09 MED ORDER — TIZANIDINE HCL 4 MG PO TABS: ORAL_TABLET | ORAL | 12 refills | Status: DC

## 2022-03-09 NOTE — Telephone Encounter (Signed)
Refills have been sent.  

## 2022-03-09 NOTE — Telephone Encounter (Signed)
Pt is asking for a new prescription for gabapentin (NEURONTIN) 300 MG capsule, tiZANidine (ZANAFLEX) 4 MG tablet &  baclofen (LIORESAL) 20 MG tablet to be sent to [facility]  ?

## 2022-03-10 ENCOUNTER — Other Ambulatory Visit: Payer: Self-pay | Admitting: Diagnostic Neuroimaging

## 2022-03-10 ENCOUNTER — Encounter: Payer: Self-pay | Admitting: Rehabilitation

## 2022-03-10 ENCOUNTER — Ambulatory Visit: Payer: Medicare HMO | Attending: Internal Medicine | Admitting: Rehabilitation

## 2022-03-10 DIAGNOSIS — R2681 Unsteadiness on feet: Secondary | ICD-10-CM | POA: Insufficient documentation

## 2022-03-10 DIAGNOSIS — R2689 Other abnormalities of gait and mobility: Secondary | ICD-10-CM | POA: Diagnosis not present

## 2022-03-10 DIAGNOSIS — G35 Multiple sclerosis: Secondary | ICD-10-CM | POA: Diagnosis not present

## 2022-03-10 DIAGNOSIS — R296 Repeated falls: Secondary | ICD-10-CM | POA: Insufficient documentation

## 2022-03-10 DIAGNOSIS — R293 Abnormal posture: Secondary | ICD-10-CM | POA: Diagnosis not present

## 2022-03-10 DIAGNOSIS — M6281 Muscle weakness (generalized): Secondary | ICD-10-CM | POA: Insufficient documentation

## 2022-03-10 NOTE — Therapy (Signed)
?OUTPATIENT PHYSICAL THERAPY TREATMENT NOTE ? ? ?Patient Name: Ebony Warren ?MRN: [id number] ?DOB:[birth year], 46 y.o., female ?Today's Date: 03/10/2022 ? ?PCP: Mike Craze, DO ?REFERRING PROVIDER: Angelica Pou, MD  ? ? PT End of Session - 03/10/22 1411   ? ? Visit Number 9   ? Number of Visits 17   ? Date for PT Re-Evaluation 04/05/22   ? Authorization Type Aetna Medicare/Medicaid   ? Progress Note Due on Visit 10   ? PT Start Time 1410   PT late from previous session  ? PT Stop Time 1937   ? PT Time Calculation (min) 35 min   ? Equipment Utilized During Treatment Gait belt   ? Activity Tolerance Patient tolerated treatment well   ? Behavior During Therapy [facility] for tasks assessed/performed   ? ?  ?  ? ?  ? ? ? ? ? ? ?Past Medical History:  ?Diagnosis Date  ? Anxiety and depression   ? Arthritis   ? Bradycardia   ? Encounter to establish care 01/22/2021  ? Family history of adverse reaction to anesthesia   ? nausea  ? GERD (gastroesophageal reflux disease)   ? Headache   ? Multiple sclerosis ([place])   ? Screening mammogram, encounter for 01/22/2021  ? Seizures ([place])   ? told that they are not from the brain but stress related  ? ?Past Surgical History:  ?Procedure Laterality Date  ? BREAST REDUCTION SURGERY Bilateral 06/23/2021  ? Procedure: MAMMARY REDUCTION  (BREAST);  Surgeon: Cindra Presume, MD;  Location: [place];  Service: Plastics;  Laterality: Bilateral;  ? CHOLECYSTECTOMY    ? KNEE SURGERY Bilateral   ? Arthroscopy  ? ?Patient Active Problem List  ? Diagnosis Date Noted  ? Fall 01/09/2022  ? Cramp and spasm 08/12/2021  ? Restless leg syndrome 08/12/2021  ? Healthcare maintenance 08/12/2021  ? Macromastia 06/23/2021  ? Gastroesophageal reflux disease 01/22/2021  ? COVID-19 virus infection 01/22/2021  ? Morbid obesity ([place]) 01/22/2021  ? Right optic neuritis 05/13/2016  ? Slurred speech 01/27/2016  ? Other fatigue 01/27/2016  ? Depression 01/27/2016  ? Absence of bladder continence  01/27/2016  ? Multiple sclerosis ([place]) 10/31/2014  ? Hemiplegia, unspecified, affecting dominant side 08/30/2014  ? ? ?REFERRING DIAG: W19.Merril Abbe (ICD-10-CM) - Fall, initial encounter   ? ?THERAPY DIAG:  ?Muscle weakness (generalized) ? ?Other abnormalities of gait and mobility ? ?Unsteadiness on feet ? ?Repeated falls ? ?Abnormal posture ? ?PERTINENT HISTORY: MS, OA, anxiety/depression  ? ?PRECAUTIONS: Fall ? ?SUBJECTIVE: Rated fatigue as 5/10. Exercises are going well, no new changes. ? ?PAIN:  ?Are you having pain? Yes ?NPRS scale: 5/10 ?Pain location: RLE (ankle) ?PAIN TYPE: aching and sharp ?Pain description: constant  ?   ? ?TODAY'S TREATMENT:  ? ? ?Gait training ?Gait pattern:  bilateral genu valgus, step through pattern, decreased stride length, decreased hip/knee flexion- Right, decreased hip/knee flexion- Left, decreased ankle dorsiflexion- Right, decreased ankle dorsiflexion- Left, trunk flexed, poor foot clearance- Right, and poor foot clearance- Left ?Distance walked: 25' x 1, 20' x 2 ?Assistive device utilized: Environmental consultant - 2 wheeled ?Level of assistance: SBA to min/guard without chair follow but PT did have to grab chair for her to sit down prior to getting to // bars.   ?Comments: Ambulated x 30' with RW before needing seated rest break.  Cues for upright posture and to lift chest as well as for improved quad activation in stance phase (bilaterally).  Ambulated another 20' x 2  as above.  ?NMR: ? ?In // bars worked on alt toe taps to 2" step with BUE support x 2 sets of 5 reps.  Cues for improved R lateral weight shift with more upright posture throughout.  Performed 2 step ups to 2" step leading with LLE and descending with RLE.  Continued to work on balance decreasing UE support alt UE flexion and slight lateral reach for improved lateral weight shift.  Note needs more cuing to shift to the R.  Added trunk rotation with a reach across midline and lateral shift x 5 reps each direction.  ? ?  ?PATIENT  EDUCATION: ?Education details: Continuing HEP at home  ?Person educated: Patient  ?Education method: Explanation and handout ?Education comprehension: verbalized understanding  ?  ?  ?HOME EXERCISE PROGRAM: ?Access Code: [place] ?URL: [email or web address] ?Date: 02/25/2022 ?Prepared by: Mickie Bail Plaster ? ?Exercises ?Seated March - 1 x daily - 7 x weekly - 3 sets - 10 reps ?Seated Hip Adduction Squeeze with Ball - 1 x daily - 7 x weekly - 3 sets - 10 reps - 3s hold ?Seated march with opposite shoulder raise - 1 x daily - 7 x weekly - 3 sets - 10 reps ?Seated Chair Push Ups - 1 x daily - 7 x weekly - 3 sets - 10 reps - 3s hold ?Proper Sit to Stand Technique - 1 x daily - 7 x weekly - 3 sets - 10 reps ? ? ?  ?ASSESSMENT: ?  ?CLINICAL IMPRESSION: ?Skilled session focused on gait with RW for improved quality and distance.  Only able to tolerate max of 25' today with increased cues needed with fatigue. Also needing several seated rest breaks when in // bars performing balance tasks with emphasis on improved R lateral weight shift and dec UE support.  ?  ?  ?OBJECTIVE IMPAIRMENTS Abnormal gait, decreased activity tolerance, decreased balance, decreased cognition, decreased coordination, decreased endurance, decreased knowledge of use of DME, decreased mobility, difficulty walking, decreased strength, decreased safety awareness, impaired perceived functional ability, impaired UE functional use, and postural dysfunction.  ?  ?ACTIVITY LIMITATIONS cleaning, community activity, driving, meal prep, and laundry.  ?  ?PERSONAL FACTORS Time since onset of injury/illness/exacerbation and 3+ comorbidities: MS, OA, anxiety/depression  are also affecting patient's functional outcome.  ?  ?  ?  ?GOALS: ?Goals reviewed with patient? Yes ?  ?SHORT TERM GOALS: ?  ?STG Name Target Date Goal status  ?1 Pt/caregiver will be IND with HEP in order to indicate improved functional mobility and dec fall risk. ?  ?Baseline:   03/04/2022 MET  ?2 Pt will perform sit<>stand to RW at min A level in order to reduce burden of care and make more IND at home.  ?Baseline:  03/04/2022 MET  ?3 Pt will perform stand pivot transfers both directions with RW at min A level in order to indicate improved functional mobility.  ?Baseline: 03/04/2022 MET  ?4 Pt will perform dynamic standing with single UE support x 5 mins at min A in order to complete ADLs at home.  ?Baseline: 03/04/2022 Partially met ?34min w/BUE support   ?5 Pt will ambulate x 20' with RW and mod A in order to indicate improved functional mobility.  ?Baseline: 03/04/2022 MET  ?  ?LONG TERM GOALS:  ?  ?LTG Name Target Date Goal status  ?1 Pt/caregiver will be IND with final HEP in order to indicate improved functional mobility and dec fall risk. ?  ?Baseline: 04/01/2022 INITIAL  ?2 Pt will  perform sit<>stand to RW at Mod I level in order to dec burden of care and improve functional mobility.  ?Baseline: 04/01/2022 REVISED  ?3 Pt will perform stand pivot transfers with RW at mod I level in order to indicate improved functional mobility.  ?Baseline: 04/01/2022 REVISED  ?4 Pt will tolerate standing x 5 mins with single UE support (alt) at S level in order to improve standing tolerance, strength and functional mobility.   ?Baseline: 04/01/2022 REVISED  ?5 Pt will ambulate x 50' with RW at min A level in order to indicate improved functional mobility.  ?Baseline: 04/01/2022 REVISED  ?  ?PLAN: ?PT FREQUENCY: 2x/week ?  ?PT DURATION: 8 weeks ?  ?PLANNED INTERVENTIONS: Therapeutic exercises, Therapeutic activity, Neuromuscular re-education, Balance training, Gait training, Patient/Family education, DME instructions, and Aquatic Therapy ?  ?PLAN FOR NEXT SESSION: How is HEP? gait training, toe taps to step, glute strengthening exercises, SciFit w/BLEs only  ? ?  ? ?Cameron Sprang, PT, MPT ?[facility] ?[place], [place], [address] ?Phone: [phone]    Fax:  [phone] ?03/10/22, 4:39 PM ? ? ?   ?

## 2022-03-11 DIAGNOSIS — G35 Multiple sclerosis: Secondary | ICD-10-CM | POA: Diagnosis not present

## 2022-03-12 ENCOUNTER — Ambulatory Visit: Payer: Medicare HMO | Admitting: Physical Therapy

## 2022-03-12 ENCOUNTER — Other Ambulatory Visit: Payer: Self-pay | Admitting: *Deleted

## 2022-03-12 DIAGNOSIS — M6281 Muscle weakness (generalized): Secondary | ICD-10-CM

## 2022-03-12 DIAGNOSIS — R2681 Unsteadiness on feet: Secondary | ICD-10-CM | POA: Diagnosis not present

## 2022-03-12 DIAGNOSIS — R2689 Other abnormalities of gait and mobility: Secondary | ICD-10-CM | POA: Diagnosis not present

## 2022-03-12 DIAGNOSIS — G35 Multiple sclerosis: Secondary | ICD-10-CM | POA: Diagnosis not present

## 2022-03-12 DIAGNOSIS — R296 Repeated falls: Secondary | ICD-10-CM | POA: Diagnosis not present

## 2022-03-12 DIAGNOSIS — R293 Abnormal posture: Secondary | ICD-10-CM | POA: Diagnosis not present

## 2022-03-12 MED ORDER — BACLOFEN 20 MG PO TABS: 20.0000 mg | ORAL_TABLET | Freq: Three times a day (TID) | ORAL | 3 refills | Status: DC

## 2022-03-12 NOTE — Therapy (Signed)
?OUTPATIENT PHYSICAL THERAPY TREATMENT NOTE- 10TH VISIT PROGRESS NOTE ? ? ?Patient Name: Ebony Warren ?MRN: [id number] ?DOB:October 20, 1976, 46 y.o., female ?Today's Date: 03/12/2022 ? ?PCP: Mike Craze, DO ?REFERRING PROVIDER: Angelica Pou, MD  ? ?Physical Therapy Progress Note ?  ?Dates of Reporting Period:02/04/22 - 03/12/22 ? ?See Note below for Objective Data and Assessment of Progress/Goals. ? ?Thank you for the referral of this patient. ?Francena Hanly, PT, DPT ? ? ? PT End of Session - 03/12/22 1320   ? ? Visit Number 10   ? Number of Visits 17   ? Date for PT Re-Evaluation 04/05/22   ? Authorization Type Aetna Medicare/Medicaid   ? Progress Note Due on Visit 10   ? PT Start Time 1318   ? PT Stop Time 1356   ? PT Time Calculation (min) 38 min   ? Equipment Utilized During Treatment Gait belt   ? Activity Tolerance Patient tolerated treatment well   ? Behavior During Therapy [facility] for tasks assessed/performed   ? ?  ?  ? ?  ? ? ? ? ? ? ? ?Past Medical History:  ?Diagnosis Date  ? Anxiety and depression   ? Arthritis   ? Bradycardia   ? Encounter to establish care 01/22/2021  ? Family history of adverse reaction to anesthesia   ? nausea  ? GERD (gastroesophageal reflux disease)   ? Headache   ? Multiple sclerosis ([place])   ? Screening mammogram, encounter for 01/22/2021  ? Seizures ([place])   ? told that they are not from the brain but stress related  ? ?Past Surgical History:  ?Procedure Laterality Date  ? BREAST REDUCTION SURGERY Bilateral 06/23/2021  ? Procedure: MAMMARY REDUCTION  (BREAST);  Surgeon: Cindra Presume, MD;  Location: [place];  Service: Plastics;  Laterality: Bilateral;  ? CHOLECYSTECTOMY    ? KNEE SURGERY Bilateral   ? Arthroscopy  ? ?Patient Active Problem List  ? Diagnosis Date Noted  ? Fall 01/09/2022  ? Cramp and spasm 08/12/2021  ? Restless leg syndrome 08/12/2021  ? Healthcare maintenance 08/12/2021  ? Macromastia 06/23/2021  ? Gastroesophageal reflux disease 01/22/2021  ?  COVID-19 virus infection 01/22/2021  ? Morbid obesity ([place]) 01/22/2021  ? Right optic neuritis 05/13/2016  ? Slurred speech 01/27/2016  ? Other fatigue 01/27/2016  ? Depression 01/27/2016  ? Absence of bladder continence 01/27/2016  ? Multiple sclerosis ([place]) 10/31/2014  ? Hemiplegia, unspecified, affecting dominant side 08/30/2014  ? ? ?REFERRING DIAG: W19.Merril Abbe (ICD-10-CM) - Fall, initial encounter   ? ?THERAPY DIAG:  ?Muscle weakness (generalized) ? ?Other abnormalities of gait and mobility ? ?Unsteadiness on feet ? ?PERTINENT HISTORY: MS, OA, anxiety/depression  ? ?PRECAUTIONS: Fall ? ?SUBJECTIVE: Rated fatigue as 2/10. Exercises are going well, no new changes. ? ?PAIN:  ?Are you having pain? No ?   ? ?TODAY'S TREATMENT:  ? ? ?Gait training ?Gait pattern:  bilateral genu valgus, step through pattern, decreased stride length, decreased hip/knee flexion- Right, decreased hip/knee flexion- Left, decreased ankle dorsiflexion- Right, decreased ankle dorsiflexion- Left, trunk flexed, poor foot clearance- Right, and poor foot clearance- Left ?Distance walked: 39' and 19'  ?Assistive device utilized: Environmental consultant - 2 wheeled ?Level of assistance: SBA with close power chair follow  ?Comments: Pt demonstrated improved trunk extension throughout initial gait trial and began to exhibit forward lean and over-reliance on BUEs as she fatigued. Noted increased step clearance/length. Short seated rest break taken between trials  ? ? ?Transfers:  ?Level of Assistance: CGA ?  Assistive device utilized: Environmental consultant - 2 wheeled ?Comments: Pt performed sit <>stands from power chair throughout session to RW w/CGA. Noted good anterior weight shift, hand placement and trunk extension throughout.  ? ? ?NMR: ?Standing [place] toe taps (green and orange) w/BUE support on RW and min guard for improved lateral weight shift, single leg stability and hip flexor strength. Pt performed ~3 taps per side, unable to reach foot to top of stone w/RLE. Pt  fatigued very quickly and required min A for stand <>sit due to poor eccentric control of bilat quads.  ? ?Ther Ex ?SciFit level 1.5 for 8 minutes w/BLEs only for improved BLE in fatigued range and global conditioning. Total of 553 steps. Pt able to place BLEs into/out of foot plates on her own today.  ? ?  ?PATIENT EDUCATION: ?Education details: Continuing HEP at home  ?Person educated: Patient  ?Education method: Explanation and handout ?Education comprehension: verbalized understanding  ?  ?  ?HOME EXERCISE PROGRAM: ?Access Code: [place] ?URL: [email or web address] ?Date: 02/25/2022 ?Prepared by: Mickie Bail Zarif Rathje ? ?Exercises ?Seated March - 1 x daily - 7 x weekly - 3 sets - 10 reps ?Seated Hip Adduction Squeeze with Ball - 1 x daily - 7 x weekly - 3 sets - 10 reps - 3s hold ?Seated march with opposite shoulder raise - 1 x daily - 7 x weekly - 3 sets - 10 reps ?Seated Chair Push Ups - 1 x daily - 7 x weekly - 3 sets - 10 reps - 3s hold ?Proper Sit to Stand Technique - 1 x daily - 7 x weekly - 3 sets - 10 reps ? ? ?  ?ASSESSMENT: ?  ?CLINICAL IMPRESSION: ?Emphasis of skilled PT session on improved endurance, lateral weight shifting, gait training and single leg stability. Pt ambulated 50' today w/CGA, her furthest distance so far. Pt able to maintain trunk extension throughout first half of walk until reaching fatigue. Pt continues to demonstrate difficulty with single leg tasks due to RLE weakness. Continue POC.  ?  ?  ?OBJECTIVE IMPAIRMENTS Abnormal gait, decreased activity tolerance, decreased balance, decreased cognition, decreased coordination, decreased endurance, decreased knowledge of use of DME, decreased mobility, difficulty walking, decreased strength, decreased safety awareness, impaired perceived functional ability, impaired UE functional use, and postural dysfunction.  ?  ?ACTIVITY LIMITATIONS cleaning, community activity, driving, meal prep, and laundry.  ?  ?PERSONAL FACTORS Time since  onset of injury/illness/exacerbation and 3+ comorbidities: MS, OA, anxiety/depression  are also affecting patient's functional outcome.  ?  ?  ?  ?GOALS: ?Goals reviewed with patient? Yes ?  ?SHORT TERM GOALS: ?  ?STG Name Target Date Goal status  ?1 Pt/caregiver will be IND with HEP in order to indicate improved functional mobility and dec fall risk. ?  ?Baseline:  03/04/2022 MET  ?2 Pt will perform sit<>stand to RW at min A level in order to reduce burden of care and make more IND at home.  ?Baseline:  03/04/2022 MET  ?3 Pt will perform stand pivot transfers both directions with RW at min A level in order to indicate improved functional mobility.  ?Baseline: 03/04/2022 MET  ?4 Pt will perform dynamic standing with single UE support x 5 mins at min A in order to complete ADLs at home.  ?Baseline: 03/04/2022 Partially met ?28min w/BUE support   ?5 Pt will ambulate x 20' with RW and mod A in order to indicate improved functional mobility.  ?Baseline: 03/04/2022 MET  ?  ?LONG TERM GOALS:  ?  ?  LTG Name Target Date Goal status  ?1 Pt/caregiver will be IND with final HEP in order to indicate improved functional mobility and dec fall risk. ?  ?Baseline: 04/01/2022 INITIAL  ?2 Pt will perform sit<>stand to RW at Mod I level in order to dec burden of care and improve functional mobility.  ?Baseline: 04/01/2022 REVISED  ?3 Pt will perform stand pivot transfers with RW at mod I level in order to indicate improved functional mobility.  ?Baseline: 04/01/2022 REVISED  ?4 Pt will tolerate standing x 5 mins with single UE support (alt) at S level in order to improve standing tolerance, strength and functional mobility.   ?Baseline: 04/01/2022 REVISED  ?5 Pt will ambulate x 75' with RW at min A level in order to indicate improved functional mobility.  ?Baseline: 04/01/2022 REVISED  ?  ?PLAN: ?PT FREQUENCY: 2x/week ?  ?PT DURATION: 8 weeks ?  ?PLANNED INTERVENTIONS: Therapeutic exercises, Therapeutic activity, Neuromuscular re-education,  Balance training, Gait training, Patient/Family education, DME instructions, and Aquatic Therapy ?  ?PLAN FOR NEXT SESSION: How is HEP? gait training, toe taps to step, glute/posterior chain strengthening exercises,

## 2022-03-13 DIAGNOSIS — G35 Multiple sclerosis: Secondary | ICD-10-CM | POA: Diagnosis not present

## 2022-03-16 DIAGNOSIS — G35 Multiple sclerosis: Secondary | ICD-10-CM | POA: Diagnosis not present

## 2022-03-17 ENCOUNTER — Ambulatory Visit: Payer: Medicare HMO | Admitting: Physical Therapy

## 2022-03-17 DIAGNOSIS — G35 Multiple sclerosis: Secondary | ICD-10-CM | POA: Diagnosis not present

## 2022-03-18 DIAGNOSIS — H40023 Open angle with borderline findings, high risk, bilateral: Secondary | ICD-10-CM | POA: Diagnosis not present

## 2022-03-18 DIAGNOSIS — H532 Diplopia: Secondary | ICD-10-CM | POA: Diagnosis not present

## 2022-03-18 DIAGNOSIS — H47293 Other optic atrophy, bilateral: Secondary | ICD-10-CM | POA: Diagnosis not present

## 2022-03-18 DIAGNOSIS — G35 Multiple sclerosis: Secondary | ICD-10-CM | POA: Diagnosis not present

## 2022-03-19 ENCOUNTER — Ambulatory Visit: Payer: Medicare HMO | Admitting: Physical Therapy

## 2022-03-19 ENCOUNTER — Telehealth: Payer: Self-pay | Admitting: Plastic Surgery

## 2022-03-19 DIAGNOSIS — M6281 Muscle weakness (generalized): Secondary | ICD-10-CM | POA: Diagnosis not present

## 2022-03-19 DIAGNOSIS — R2689 Other abnormalities of gait and mobility: Secondary | ICD-10-CM

## 2022-03-19 DIAGNOSIS — R296 Repeated falls: Secondary | ICD-10-CM | POA: Diagnosis not present

## 2022-03-19 DIAGNOSIS — R2681 Unsteadiness on feet: Secondary | ICD-10-CM

## 2022-03-19 DIAGNOSIS — R293 Abnormal posture: Secondary | ICD-10-CM | POA: Diagnosis not present

## 2022-03-19 DIAGNOSIS — G35 Multiple sclerosis: Secondary | ICD-10-CM | POA: Diagnosis not present

## 2022-03-19 NOTE — Therapy (Signed)
?OUTPATIENT PHYSICAL THERAPY TREATMENT NOTE ? ? ?Patient Name: Ebony Warren ?MRN: [id number] ?DOB:30-Nov-1976, 46 y.o., female ?Today's Date: 03/19/2022 ? ?PCP: Mike Craze, DO ?REFERRING PROVIDER: Angelica Pou, MD  ? ? ? PT End of Session - 03/19/22 1415   ? ? Visit Number 11   ? Number of Visits 17   ? Date for PT Re-Evaluation 04/05/22   ? Authorization Type Aetna Medicare/Medicaid   ? Progress Note Due on Visit 10   ? PT Start Time 1413   Pt in restroom  ? PT Stop Time 9326   ? PT Time Calculation (min) 33 min   ? Equipment Utilized During Treatment Gait belt   ? Activity Tolerance Patient tolerated treatment well   ? Behavior During Therapy [facility] for tasks assessed/performed   ? ?  ?  ? ?  ? ? ? ? ? ? ? ?Past Medical History:  ?Diagnosis Date  ? Anxiety and depression   ? Arthritis   ? Bradycardia   ? Encounter to establish care 01/22/2021  ? Family history of adverse reaction to anesthesia   ? nausea  ? GERD (gastroesophageal reflux disease)   ? Headache   ? Multiple sclerosis ([place])   ? Screening mammogram, encounter for 01/22/2021  ? Seizures ([place])   ? told that they are not from the brain but stress related  ? ?Past Surgical History:  ?Procedure Laterality Date  ? BREAST REDUCTION SURGERY Bilateral 06/23/2021  ? Procedure: MAMMARY REDUCTION  (BREAST);  Surgeon: Cindra Presume, MD;  Location: [place];  Service: Plastics;  Laterality: Bilateral;  ? CHOLECYSTECTOMY    ? KNEE SURGERY Bilateral   ? Arthroscopy  ? ?Patient Active Problem List  ? Diagnosis Date Noted  ? Fall 01/09/2022  ? Cramp and spasm 08/12/2021  ? Restless leg syndrome 08/12/2021  ? Healthcare maintenance 08/12/2021  ? Macromastia 06/23/2021  ? Gastroesophageal reflux disease 01/22/2021  ? COVID-19 virus infection 01/22/2021  ? Morbid obesity ([place]) 01/22/2021  ? Right optic neuritis 05/13/2016  ? Slurred speech 01/27/2016  ? Other fatigue 01/27/2016  ? Depression 01/27/2016  ? Absence of bladder continence 01/27/2016  ?  Multiple sclerosis ([place]) 10/31/2014  ? Hemiplegia, unspecified, affecting dominant side 08/30/2014  ? ? ?REFERRING DIAG: W19.Merril Abbe (ICD-10-CM) - Fall, initial encounter   ? ?THERAPY DIAG:  ?Muscle weakness (generalized) ? ?Other abnormalities of gait and mobility ? ?Unsteadiness on feet ? ?PERTINENT HISTORY: MS, OA, anxiety/depression  ? ?PRECAUTIONS: Fall ? ?SUBJECTIVE: Rated fatigue as 2/10. Exercises are going well, no new changes. Reports she is too hot due to transport van not having AC on when she got in vehicle, which contributes to fatigue.   ? ?PAIN:  ?Are you having pain? No ?   ? ?TODAY'S TREATMENT:  ? ? ?Gait training ?Gait pattern:  bilateral genu valgus, step through pattern, decreased stride length, decreased hip/knee flexion- Right, decreased hip/knee flexion- Left, decreased ankle dorsiflexion- Right, decreased ankle dorsiflexion- Left, trunk flexed, poor foot clearance- Right, and poor foot clearance- Left ?Distance walked: 81' and 20'  ?Assistive device utilized: Environmental consultant - 2 wheeled ?Level of assistance: SBA with close power chair follow  ?Comments: Pt demonstrated decreased trunk extension throughout and over-reliance on BUEs due to fatigue. Noted increased step clearance/length. Short seated rest break taken between trials  ? ?Transfers:  ?Level of Assistance: SBA ?Assistive device utilized: Environmental consultant - 2 wheeled ?Comments: Pt performed sit <>stands from power chair throughout session to RW w/SBA. Noted good anterior  weight shift, hand placement and trunk extension throughout. Pt able to perform transfer more quickly  ? ?Ther Ex ?SciFit sprint hill level 2 for 5.5 minutes w/BLEs only for interval training, improved BLE in fatigued range and global conditioning. Pt reported increase in pain in BLEs, so will repeat next week on lower resistance.  ? ?  ?PATIENT EDUCATION: ?Education details: Continuing HEP at home, benefits of interval training  ?Person educated: Patient  ?Education method: Explanation  and handout ?Education comprehension: verbalized understanding  ?  ?  ?HOME EXERCISE PROGRAM: ?Access Code: [place] ?URL: [email or web address] ?Date: 02/25/2022 ?Prepared by: Mickie Bail Hilberto Burzynski ? ?Exercises ?Seated March - 1 x daily - 7 x weekly - 3 sets - 10 reps ?Seated Hip Adduction Squeeze with Ball - 1 x daily - 7 x weekly - 3 sets - 10 reps - 3s hold ?Seated march with opposite shoulder raise - 1 x daily - 7 x weekly - 3 sets - 10 reps ?Seated Chair Push Ups - 1 x daily - 7 x weekly - 3 sets - 10 reps - 3s hold ?Proper Sit to Stand Technique - 1 x daily - 7 x weekly - 3 sets - 10 reps ? ? ?  ?ASSESSMENT: ?  ?CLINICAL IMPRESSION: ?Emphasis of skilled PT session on improved endurance and gait training. Session limited due to pt in bathroom. Pt more fatigued today due to heat but demonstrated consistency w/gait distance compared to previous sessions. Pt demonstrated increased reliance on BUEs throughout but has improved her step length and clearance bilaterally. Continue POC.  ?  ?  ?OBJECTIVE IMPAIRMENTS Abnormal gait, decreased activity tolerance, decreased balance, decreased cognition, decreased coordination, decreased endurance, decreased knowledge of use of DME, decreased mobility, difficulty walking, decreased strength, decreased safety awareness, impaired perceived functional ability, impaired UE functional use, and postural dysfunction.  ?  ?ACTIVITY LIMITATIONS cleaning, community activity, driving, meal prep, and laundry.  ?  ?PERSONAL FACTORS Time since onset of injury/illness/exacerbation and 3+ comorbidities: MS, OA, anxiety/depression  are also affecting patient's functional outcome.  ?  ?  ?  ?GOALS: ?Goals reviewed with patient? Yes ?  ?SHORT TERM GOALS: ?  ?STG Name Target Date Goal status  ?1 Pt/caregiver will be IND with HEP in order to indicate improved functional mobility and dec fall risk. ?  ?Baseline:  03/04/2022 MET  ?2 Pt will perform sit<>stand to RW at min A level in order  to reduce burden of care and make more IND at home.  ?Baseline:  03/04/2022 MET  ?3 Pt will perform stand pivot transfers both directions with RW at min A level in order to indicate improved functional mobility.  ?Baseline: 03/04/2022 MET  ?4 Pt will perform dynamic standing with single UE support x 5 mins at min A in order to complete ADLs at home.  ?Baseline: 03/04/2022 Partially met ?32min w/BUE support   ?5 Pt will ambulate x 20' with RW and mod A in order to indicate improved functional mobility.  ?Baseline: 03/04/2022 MET  ?  ?LONG TERM GOALS:  ?  ?LTG Name Target Date Goal status  ?1 Pt/caregiver will be IND with final HEP in order to indicate improved functional mobility and dec fall risk. ?  ?Baseline: 04/01/2022 INITIAL  ?2 Pt will perform sit<>stand to RW at Mod I level in order to dec burden of care and improve functional mobility.  ?Baseline: 04/01/2022 REVISED  ?3 Pt will perform stand pivot transfers with RW at mod I level in order to indicate improved  functional mobility.  ?Baseline: 04/01/2022 REVISED  ?4 Pt will tolerate standing x 5 mins with single UE support (alt) at S level in order to improve standing tolerance, strength and functional mobility.   ?Baseline: 04/01/2022 REVISED  ?5 Pt will ambulate x 75' with RW at min A level in order to indicate improved functional mobility.  ?Baseline: 04/01/2022 REVISED  ?  ?PLAN: ?PT FREQUENCY: 2x/week ?  ?PT DURATION: 8 weeks ?  ?PLANNED INTERVENTIONS: Therapeutic exercises, Therapeutic activity, Neuromuscular re-education, Balance training, Gait training, Patient/Family education, DME instructions, and Aquatic Therapy ?  ?PLAN FOR NEXT SESSION: "Tell Raquel Sarna to take it easy on me next time". How is HEP? gait training, toe taps to step, glute/posterior chain strengthening exercises, SciFit w/BLEs only, standing tolerance, balance in // bars  ? ?  ? ?Cruzita Lederer Antanette Richwine, PT, DPT ?03/19/22, 4:33 PM ? ? ?   ?

## 2022-03-19 NOTE — Telephone Encounter (Signed)
Called and spoke with the patient regarding the message below.  Informed the patient that we will need to see her since her last visit was (09/03/2021).  Patient verbalized understanding and agreed.  Patient was scheduled an appointment.//AB/CMA ?

## 2022-03-19 NOTE — Telephone Encounter (Signed)
Patient called to report that the wound and area around her nipples from her breast reduction has a film of crust and is not healing.  ?

## 2022-03-20 DIAGNOSIS — G35 Multiple sclerosis: Secondary | ICD-10-CM | POA: Diagnosis not present

## 2022-03-23 DIAGNOSIS — G35 Multiple sclerosis: Secondary | ICD-10-CM | POA: Diagnosis not present

## 2022-03-24 ENCOUNTER — Ambulatory Visit: Payer: Medicare HMO

## 2022-03-24 DIAGNOSIS — G35 Multiple sclerosis: Secondary | ICD-10-CM | POA: Diagnosis not present

## 2022-03-24 DIAGNOSIS — R2681 Unsteadiness on feet: Secondary | ICD-10-CM

## 2022-03-24 DIAGNOSIS — R293 Abnormal posture: Secondary | ICD-10-CM

## 2022-03-24 DIAGNOSIS — R296 Repeated falls: Secondary | ICD-10-CM | POA: Diagnosis not present

## 2022-03-24 DIAGNOSIS — R2689 Other abnormalities of gait and mobility: Secondary | ICD-10-CM

## 2022-03-24 DIAGNOSIS — M6281 Muscle weakness (generalized): Secondary | ICD-10-CM | POA: Diagnosis not present

## 2022-03-24 NOTE — Therapy (Signed)
?OUTPATIENT PHYSICAL THERAPY TREATMENT NOTE ? ? ?Patient Name: Ebony Warren ?MRN: [id number] ?DOB:Jun 26, 1976, 46 y.o., female ?Today's Date: 03/24/2022 ? ?PCP: Mike Craze, DO ?REFERRING PROVIDER: Angelica Pou, MD  ? ? ? PT End of Session - 03/24/22 1408   ? ? Visit Number 12   ? Number of Visits 17   ? Date for PT Re-Evaluation 04/05/22   ? Authorization Type Aetna Medicare/Medicaid   ? Progress Note Due on Visit 10   ? PT Start Time 1410   ? PT Stop Time 1450   ? PT Time Calculation (min) 40 min   ? Equipment Utilized During Treatment Gait belt   ? Activity Tolerance Patient tolerated treatment well   ? Behavior During Therapy [facility] for tasks assessed/performed   ? ?  ?  ? ?  ? ? ? ? ? ? ? ?Past Medical History:  ?Diagnosis Date  ? Anxiety and depression   ? Arthritis   ? Bradycardia   ? Encounter to establish care 01/22/2021  ? Family history of adverse reaction to anesthesia   ? nausea  ? GERD (gastroesophageal reflux disease)   ? Headache   ? Multiple sclerosis ([place])   ? Screening mammogram, encounter for 01/22/2021  ? Seizures ([place])   ? told that they are not from the brain but stress related  ? ?Past Surgical History:  ?Procedure Laterality Date  ? BREAST REDUCTION SURGERY Bilateral 06/23/2021  ? Procedure: MAMMARY REDUCTION  (BREAST);  Surgeon: Cindra Presume, MD;  Location: [place];  Service: Plastics;  Laterality: Bilateral;  ? CHOLECYSTECTOMY    ? KNEE SURGERY Bilateral   ? Arthroscopy  ? ?Patient Active Problem List  ? Diagnosis Date Noted  ? Fall 01/09/2022  ? Cramp and spasm 08/12/2021  ? Restless leg syndrome 08/12/2021  ? Healthcare maintenance 08/12/2021  ? Macromastia 06/23/2021  ? Gastroesophageal reflux disease 01/22/2021  ? COVID-19 virus infection 01/22/2021  ? Morbid obesity ([place]) 01/22/2021  ? Right optic neuritis 05/13/2016  ? Slurred speech 01/27/2016  ? Other fatigue 01/27/2016  ? Depression 01/27/2016  ? Absence of bladder continence 01/27/2016  ? Multiple sclerosis  ([place]) 10/31/2014  ? Hemiplegia, unspecified, affecting dominant side 08/30/2014  ? ? ?REFERRING DIAG: W19.Merril Abbe (ICD-10-CM) - Fall, initial encounter   ? ?THERAPY DIAG:  ?Muscle weakness (generalized) ? ?Other abnormalities of gait and mobility ? ?Unsteadiness on feet ? ?Repeated falls ? ?Abnormal posture ? ?PERTINENT HISTORY: MS, OA, anxiety/depression  ? ?PRECAUTIONS: Fall ? ?SUBJECTIVE: Pt reports she is doing okay. Fatigue 1/10. No pain. No falls.   ? ?PAIN:  ?Are you having pain? No ?   ? ?TODAY'S TREATMENT:  ? ? ?Gait training ?Gait pattern:  bilateral genu valgus, step through pattern, decreased stride length, decreased hip/knee flexion- Right, decreased hip/knee flexion- Left, decreased ankle dorsiflexion- Right, decreased ankle dorsiflexion- Left, trunk flexed, poor foot clearance- Right, and poor foot clearance- Left ?Distance walked: 56' and 35' ?Assistive device utilized: Environmental consultant - 2 wheeled ?Level of assistance: SBA with close power chair follow  ?Comments: used R shoe slider cover for the first walk of 40' and used Anterior reaction AFO on R foot with foot slider over the shoe for the second walk of 50' ?Pt needed to used restroom and required assistance. Asked one of the female therapist to go in with the patient. ?15 min non billable time as patient used the restroom. ?  ?PATIENT EDUCATION: ?Education details: Continuing HEP at home, benefits of interval training  ?  Person educated: Patient  ?Education method: Explanation and handout ?Education comprehension: verbalized understanding  ?  ?  ?HOME EXERCISE PROGRAM: ?Access Code: [place] ?URL: [email or web address] ?Date: 02/25/2022 ?Prepared by: Mickie Bail Plaster ? ?Exercises ?Seated March - 1 x daily - 7 x weekly - 3 sets - 10 reps ?Seated Hip Adduction Squeeze with Ball - 1 x daily - 7 x weekly - 3 sets - 10 reps - 3s hold ?Seated march with opposite shoulder raise - 1 x daily - 7 x weekly - 3 sets - 10 reps ?Seated Chair Push Ups - 1 x  daily - 7 x weekly - 3 sets - 10 reps - 3s hold ?Proper Sit to Stand Technique - 1 x daily - 7 x weekly - 3 sets - 10 reps ? ? ?  ?ASSESSMENT: ?  ?CLINICAL IMPRESSION: ?Patient demonstrated improved energy conservation and improved gait endurance with use of anterior reaction AFO on R and boot slider to improve advancement of R LE during swing phase and stability of R LE during stance phase. Patient demonstrates increased fatigue after 30' and demonstrates increased reliance on UE and decreased ability to stabilize at knees with full extension during walking.  ?  ?  ?OBJECTIVE IMPAIRMENTS Abnormal gait, decreased activity tolerance, decreased balance, decreased cognition, decreased coordination, decreased endurance, decreased knowledge of use of DME, decreased mobility, difficulty walking, decreased strength, decreased safety awareness, impaired perceived functional ability, impaired UE functional use, and postural dysfunction.  ?  ?ACTIVITY LIMITATIONS cleaning, community activity, driving, meal prep, and laundry.  ?  ?PERSONAL FACTORS Time since onset of injury/illness/exacerbation and 3+ comorbidities: MS, OA, anxiety/depression  are also affecting patient's functional outcome.  ?  ?  ?  ?GOALS: ?Goals reviewed with patient? Yes ?  ?SHORT TERM GOALS: ?  ?STG Name Target Date Goal status  ?1 Pt/caregiver will be IND with HEP in order to indicate improved functional mobility and dec fall risk. ?  ?Baseline:  03/04/2022 MET  ?2 Pt will perform sit<>stand to RW at min A level in order to reduce burden of care and make more IND at home.  ?Baseline:  03/04/2022 MET  ?3 Pt will perform stand pivot transfers both directions with RW at min A level in order to indicate improved functional mobility.  ?Baseline: 03/04/2022 MET  ?4 Pt will perform dynamic standing with single UE support x 5 mins at min A in order to complete ADLs at home.  ?Baseline: 03/04/2022 Partially met ?88min w/BUE support   ?5 Pt will ambulate x 20' with RW  and mod A in order to indicate improved functional mobility.  ?Baseline: 03/04/2022 MET  ?  ?LONG TERM GOALS:  ?  ?LTG Name Target Date Goal status  ?1 Pt/caregiver will be IND with final HEP in order to indicate improved functional mobility and dec fall risk. ?  ?Baseline: 04/01/2022 INITIAL  ?2 Pt will perform sit<>stand to RW at Mod I level in order to dec burden of care and improve functional mobility.  ?Baseline: 04/01/2022 REVISED  ?3 Pt will perform stand pivot transfers with RW at mod I level in order to indicate improved functional mobility.  ?Baseline: 04/01/2022 REVISED  ?4 Pt will tolerate standing x 5 mins with single UE support (alt) at S level in order to improve standing tolerance, strength and functional mobility.   ?Baseline: 04/01/2022 REVISED  ?5 Pt will ambulate x 75' with RW at min A level in order to indicate improved functional mobility.  ?Baseline: 04/01/2022 REVISED  ?  ?  PLAN: ?PT FREQUENCY: 2x/week ?  ?PT DURATION: 8 weeks ?  ?PLANNED INTERVENTIONS: Therapeutic exercises, Therapeutic activity, Neuromuscular re-education, Balance training, Gait training, Patient/Family education, DME instructions, and Aquatic Therapy ?  ?PLAN FOR NEXT SESSION: trial of anterior reaction AFO on R (bil?) with gait training, toe taps to step, glute/posterior chain strengthening exercises, SciFit w/BLEs only, standing tolerance, balance in // bars  ? ?  ? ?Kerrie Pleasure, PT ?03/24/2022, 2:43 PM ? ? ? ?   ?

## 2022-03-25 DIAGNOSIS — G35 Multiple sclerosis: Secondary | ICD-10-CM | POA: Diagnosis not present

## 2022-03-26 ENCOUNTER — Ambulatory Visit: Payer: Medicare HMO | Admitting: Physical Therapy

## 2022-03-26 DIAGNOSIS — R2689 Other abnormalities of gait and mobility: Secondary | ICD-10-CM

## 2022-03-26 DIAGNOSIS — M6281 Muscle weakness (generalized): Secondary | ICD-10-CM | POA: Diagnosis not present

## 2022-03-26 DIAGNOSIS — G35 Multiple sclerosis: Secondary | ICD-10-CM | POA: Diagnosis not present

## 2022-03-26 DIAGNOSIS — R2681 Unsteadiness on feet: Secondary | ICD-10-CM

## 2022-03-26 DIAGNOSIS — R293 Abnormal posture: Secondary | ICD-10-CM | POA: Diagnosis not present

## 2022-03-26 DIAGNOSIS — R296 Repeated falls: Secondary | ICD-10-CM | POA: Diagnosis not present

## 2022-03-26 NOTE — Therapy (Signed)
?OUTPATIENT PHYSICAL THERAPY TREATMENT NOTE ? ? ?Patient Name: Ebony Warren ?MRN: [id number] ?DOB:1976/07/30, 46 y.o., female ?Today's Date: 03/26/2022 ? ?PCP: Mike Craze, DO ?REFERRING PROVIDER: Angelica Pou, MD  ? ? ? PT End of Session - 03/26/22 1408   ? ? Visit Number 13   ? Number of Visits 17   ? Date for PT Re-Evaluation 04/05/22   ? Authorization Type Aetna Medicare/Medicaid   ? Progress Note Due on Visit 10   ? PT Start Time 1405   ? PT Stop Time 8338   ? PT Time Calculation (min) 40 min   ? Equipment Utilized During Treatment Gait belt   ? Activity Tolerance Patient tolerated treatment well   ? Behavior During Therapy [facility] for tasks assessed/performed   ? ?  ?  ? ?  ? ? ? ? ? ? ? ?Past Medical History:  ?Diagnosis Date  ? Anxiety and depression   ? Arthritis   ? Bradycardia   ? Encounter to establish care 01/22/2021  ? Family history of adverse reaction to anesthesia   ? nausea  ? GERD (gastroesophageal reflux disease)   ? Headache   ? Multiple sclerosis ([place])   ? Screening mammogram, encounter for 01/22/2021  ? Seizures ([place])   ? told that they are not from the brain but stress related  ? ?Past Surgical History:  ?Procedure Laterality Date  ? BREAST REDUCTION SURGERY Bilateral 06/23/2021  ? Procedure: MAMMARY REDUCTION  (BREAST);  Surgeon: Cindra Presume, MD;  Location: [place];  Service: Plastics;  Laterality: Bilateral;  ? CHOLECYSTECTOMY    ? KNEE SURGERY Bilateral   ? Arthroscopy  ? ?Patient Active Problem List  ? Diagnosis Date Noted  ? Fall 01/09/2022  ? Cramp and spasm 08/12/2021  ? Restless leg syndrome 08/12/2021  ? Healthcare maintenance 08/12/2021  ? Macromastia 06/23/2021  ? Gastroesophageal reflux disease 01/22/2021  ? COVID-19 virus infection 01/22/2021  ? Morbid obesity ([place]) 01/22/2021  ? Right optic neuritis 05/13/2016  ? Slurred speech 01/27/2016  ? Other fatigue 01/27/2016  ? Depression 01/27/2016  ? Absence of bladder continence 01/27/2016  ? Multiple sclerosis  ([place]) 10/31/2014  ? Hemiplegia, unspecified, affecting dominant side 08/30/2014  ? ? ?REFERRING DIAG: W19.Merril Abbe (ICD-10-CM) - Fall, initial encounter   ? ?THERAPY DIAG:  ?Muscle weakness (generalized) ? ?Unsteadiness on feet ? ?Other abnormalities of gait and mobility ? ?PERTINENT HISTORY: MS, OA, anxiety/depression  ? ?PRECAUTIONS: Fall ? ?SUBJECTIVE: Pt reports she is doing well. Rated fatigue 3/10. Has not been trying to stand at home.  ? ?PAIN:  ?Are you having pain? Yes ?Pain location: BLEs ?Pain intensity: 3/10 ?   ? ?TODAY'S TREATMENT:  ? ?Gait training ?Gait pattern:  bilateral genu valgus, step through pattern, decreased stride length, decreased hip/knee flexion- Right, decreased hip/knee flexion- Left, decreased ankle dorsiflexion- Right, decreased ankle dorsiflexion- Left, trunk flexed, poor foot clearance- Right, and poor foot clearance- Left ?Distance walked: 40', 43' and 12' ?Assistive device utilized: Environmental consultant - 2 wheeled ?Level of assistance: SBA with close power chair follow  ?Comments: Pt demonstrated improved endurance today, ambulating >50' twice with a short seated rest break taken in between. Min cues for upright posture as pt fatigued.  ? ?Self-care/home management ?Discussed POC moving forward and prepared pt for goal assessment next week. Pt leaning towards adding 8 more visits, will think about it more over the weekend and report back Tuesday. Encouraged pt to start performing sit <>stands at home to improve  her confidence level with weightbearing without therapist around and to progress HEP to weightbearing exercises for improved carryover with gait training. Pt verbalized understanding but reported she does not feel comfortable performing with her nurse and her mother does not trust pt. Recommended therapist record pt performing sit <>stands on pt's phone next session so mother can see pt's progress.  ? ?  ?PATIENT EDUCATION: ?Education details: see self-care section  ?Person educated:  Patient  ?Education method: Explanation and handout ?Education comprehension: verbalized understanding  ?  ?  ?HOME EXERCISE PROGRAM: ?Access Code: [place] ?URL: [email or web address] ?Date: 02/25/2022 ?Prepared by: Mickie Bail Jaycelynn Knickerbocker ? ?Exercises ?Seated March - 1 x daily - 7 x weekly - 3 sets - 10 reps ?Seated Hip Adduction Squeeze with Ball - 1 x daily - 7 x weekly - 3 sets - 10 reps - 3s hold ?Seated march with opposite shoulder raise - 1 x daily - 7 x weekly - 3 sets - 10 reps ?Seated Chair Push Ups - 1 x daily - 7 x weekly - 3 sets - 10 reps - 3s hold ?Proper Sit to Stand Technique - 1 x daily - 7 x weekly - 3 sets - 10 reps ? ? ?  ?ASSESSMENT: ?  ?CLINICAL IMPRESSION: ?Emphasis of skilled PT session on improved endurance w/gait training and discussing POC moving forward. Pt ambulated 29' and 59' with short seated rest break in between, the furthest distance she has been able to walk consistently. Pt continues to demonstrate heavy reliance on BUEs, especially as she fatigues, requiring min cues for upright posture. Continue POC.  ?  ?  ?OBJECTIVE IMPAIRMENTS Abnormal gait, decreased activity tolerance, decreased balance, decreased cognition, decreased coordination, decreased endurance, decreased knowledge of use of DME, decreased mobility, difficulty walking, decreased strength, decreased safety awareness, impaired perceived functional ability, impaired UE functional use, and postural dysfunction.  ?  ?ACTIVITY LIMITATIONS cleaning, community activity, driving, meal prep, and laundry.  ?  ?PERSONAL FACTORS Time since onset of injury/illness/exacerbation and 3+ comorbidities: MS, OA, anxiety/depression  are also affecting patient's functional outcome.  ?  ?  ?  ?GOALS: ?Goals reviewed with patient? Yes ?  ?SHORT TERM GOALS: ?  ?STG Name Target Date Goal status  ?1 Pt/caregiver will be IND with HEP in order to indicate improved functional mobility and dec fall risk. ?  ?Baseline:  03/04/2022 MET  ?2 Pt  will perform sit<>stand to RW at min A level in order to reduce burden of care and make more IND at home.  ?Baseline:  03/04/2022 MET  ?3 Pt will perform stand pivot transfers both directions with RW at min A level in order to indicate improved functional mobility.  ?Baseline: 03/04/2022 MET  ?4 Pt will perform dynamic standing with single UE support x 5 mins at min A in order to complete ADLs at home.  ?Baseline: 03/04/2022 Partially met ?75min w/BUE support   ?5 Pt will ambulate x 20' with RW and mod A in order to indicate improved functional mobility.  ?Baseline: 03/04/2022 MET  ?  ?LONG TERM GOALS:  ?  ?LTG Name Target Date Goal status  ?1 Pt/caregiver will be IND with final HEP in order to indicate improved functional mobility and dec fall risk. ?  ?Baseline: 04/01/2022 INITIAL  ?2 Pt will perform sit<>stand to RW at Mod I level in order to dec burden of care and improve functional mobility.  ?Baseline: 04/01/2022 REVISED  ?3 Pt will perform stand pivot transfers with RW at mod I level  in order to indicate improved functional mobility.  ?Baseline: 04/01/2022 REVISED  ?4 Pt will tolerate standing x 5 mins with single UE support (alt) at S level in order to improve standing tolerance, strength and functional mobility.   ?Baseline: 04/01/2022 REVISED  ?5 Pt will ambulate x 75' with RW at min A level in order to indicate improved functional mobility.  ?Baseline: 04/01/2022 REVISED  ?  ?PLAN: ?PT FREQUENCY: 2x/week ?  ?PT DURATION: 8 weeks ?  ?PLANNED INTERVENTIONS: Therapeutic exercises, Therapeutic activity, Neuromuscular re-education, Balance training, Gait training, Patient/Family education, DME instructions, and Aquatic Therapy ?  ?PLAN FOR NEXT SESSION: recert, start assessing LTGs, toe taps to step, glute/posterior chain strengthening exercises, SciFit w/BLEs only, standing tolerance, balance in // bars, gait training  ? ?  ? ?Cruzita Lederer Desree Leap, PT, DPT ?03/26/2022, 3:04 PM ? ? ? ?   ?

## 2022-03-27 ENCOUNTER — Ambulatory Visit: Payer: Medicare HMO | Admitting: Surgical

## 2022-03-27 DIAGNOSIS — G35 Multiple sclerosis: Secondary | ICD-10-CM | POA: Diagnosis not present

## 2022-03-30 DIAGNOSIS — G35 Multiple sclerosis: Secondary | ICD-10-CM | POA: Diagnosis not present

## 2022-03-31 ENCOUNTER — Ambulatory Visit: Payer: Medicare HMO | Admitting: Rehabilitation

## 2022-03-31 ENCOUNTER — Encounter: Payer: Self-pay | Admitting: Rehabilitation

## 2022-03-31 DIAGNOSIS — R293 Abnormal posture: Secondary | ICD-10-CM | POA: Diagnosis not present

## 2022-03-31 DIAGNOSIS — R2681 Unsteadiness on feet: Secondary | ICD-10-CM | POA: Diagnosis not present

## 2022-03-31 DIAGNOSIS — G35 Multiple sclerosis: Secondary | ICD-10-CM | POA: Diagnosis not present

## 2022-03-31 DIAGNOSIS — M6281 Muscle weakness (generalized): Secondary | ICD-10-CM

## 2022-03-31 DIAGNOSIS — R2689 Other abnormalities of gait and mobility: Secondary | ICD-10-CM

## 2022-03-31 DIAGNOSIS — R296 Repeated falls: Secondary | ICD-10-CM

## 2022-03-31 NOTE — Therapy (Signed)
?OUTPATIENT PHYSICAL THERAPY TREATMENT NOTE ? ? ?Patient Name: Ebony Warren ?MRN: [id number] ?DOB:[birth year], 46 y.o., female ?Today's Date: 03/31/2022 ? ?PCP: Mike Craze, DO ?REFERRING PROVIDER: Angelica Pou, MD  ? ? ? PT End of Session - 03/31/22 1407   ? ? Visit Number 14   ? Number of Visits 17   ? Date for PT Re-Evaluation 04/05/22   ? Authorization Type Aetna Medicare/Medicaid   ? Progress Note Due on Visit 10   ? PT Start Time 1402   ? PT Stop Time 6160   ? PT Time Calculation (min) 44 min   ? Equipment Utilized During Treatment Gait belt   ? Activity Tolerance Patient tolerated treatment well   ? Behavior During Therapy [facility] for tasks assessed/performed   ? ?  ?  ? ?  ? ? ? ? ? ? ? ?Past Medical History:  ?Diagnosis Date  ? Anxiety and depression   ? Arthritis   ? Bradycardia   ? Encounter to establish care 01/22/2021  ? Family history of adverse reaction to anesthesia   ? nausea  ? GERD (gastroesophageal reflux disease)   ? Headache   ? Multiple sclerosis ([place])   ? Screening mammogram, encounter for 01/22/2021  ? Seizures ([place])   ? told that they are not from the brain but stress related  ? ?Past Surgical History:  ?Procedure Laterality Date  ? BREAST REDUCTION SURGERY Bilateral 06/23/2021  ? Procedure: MAMMARY REDUCTION  (BREAST);  Surgeon: Cindra Presume, MD;  Location: [facility];  Service: Plastics;  Laterality: Bilateral;  ? CHOLECYSTECTOMY    ? KNEE SURGERY Bilateral   ? Arthroscopy  ? ?Patient Active Problem List  ? Diagnosis Date Noted  ? Fall 01/09/2022  ? Cramp and spasm 08/12/2021  ? Restless leg syndrome 08/12/2021  ? Healthcare maintenance 08/12/2021  ? Macromastia 06/23/2021  ? Gastroesophageal reflux disease 01/22/2021  ? COVID-19 virus infection 01/22/2021  ? Morbid obesity ([place]) 01/22/2021  ? Right optic neuritis 05/13/2016  ? Slurred speech 01/27/2016  ? Other fatigue 01/27/2016  ? Depression 01/27/2016  ? Absence of bladder continence 01/27/2016  ? Multiple sclerosis  ([place]) 10/31/2014  ? Hemiplegia, unspecified, affecting dominant side 08/30/2014  ? ? ?REFERRING DIAG: W19.Merril Abbe (ICD-10-CM) - Fall, initial encounter   ? ?THERAPY DIAG:  ?Muscle weakness (generalized) ? ?Unsteadiness on feet ? ?Other abnormalities of gait and mobility ? ?Repeated falls ? ?Abnormal posture ? ?PERTINENT HISTORY: MS, OA, anxiety/depression  ? ?PRECAUTIONS: Fall ? ?SUBJECTIVE: Pt reports she is doing well. Rated fatigue 4/10. Has not been trying to stand at home.  ? ?PAIN:  ?Are you having pain? Yes ?Pain location: RLE ?Pain intensity: 4/10 ?   ? ?TODAY'S TREATMENT:  ? ?Gait training ?Gait pattern:  bilateral genu valgus, step through pattern, decreased stride length, decreased hip/knee flexion- Right, decreased hip/knee flexion- Left, decreased ankle dorsiflexion- Right, decreased ankle dorsiflexion- Left, trunk flexed, poor foot clearance- Right, and poor foot clearance- Left ? ? ?Self-care/home management ?Discussed POC and what her decision was for continuing therapy.  She reports she would like to continue another 4 weeks.   ? ?Therex:  Provided and performed standing therex today at counter top with power w/c behind her.  See below for details.  Pt encouraged to attempt 5 reps prior to sitting however within session she did need several seated rest breaks, sometimes not making it to 5 reps.   ? ?TA: Assessed sit<>stand and stand pivot transfers for LTG assessment.  She is able to perform sit<>stand with RW at mod I level, however needs cues for safety when turning with RW during stand pivot transfers.   ? ? ?  ?PATIENT EDUCATION: ?Education details: see self-care section  ?Person educated: Patient  ?Education method: Explanation and handout ?Education comprehension: verbalized understanding  ?  ?  ?HOME EXERCISE PROGRAM: ?Access Code: [email or web address] ?URL: [email or web address] ?Date: 02/25/2022 ?Prepared by: Mickie Bail Plaster ? ?Exercises ?Seated March - 1 x daily - 7 x weekly - 3 sets - 10  reps ?Seated Hip Adduction Squeeze with Ball - 1 x daily - 7 x weekly - 3 sets - 10 reps - 3s hold ?Seated march with opposite shoulder raise - 1 x daily - 7 x weekly - 3 sets - 10 reps ?Seated Chair Push Ups - 1 x daily - 7 x weekly - 3 sets - 10 reps - 3s hold ?Proper Sit to Stand Technique - 1 x daily - 7 x weekly - 3 sets - 10 reps ? ? ?- Standing Weight Shift Side to Side  - 1 x daily - 7 x weekly - 2 sets - 5 reps ?- Side Stepping with Counter Support  - 1 x daily - 7 x weekly - 2 sets - 5 reps ?- Lateral Weight Shift with Arm Raise and Walker  - 1 x daily - 7 x weekly - 2 sets - 5 reps ?- Mini Squat with Counter Support  - 1 x daily - 7 x weekly - 2 sets - 5 reps ? ?  ?ASSESSMENT: ?  ?CLINICAL IMPRESSION: ?Skilled session focused on beginning to address LTGs.  Added standing exercises to HEP for her to do at home.  Provided facilitation throughout to demonstrate more adequate weights shift but she is able to do them all with min cues for technique.  Also assessed functional mobility with sit<>stand and stand pivot transfers with RW.  She is making progress and will benefit from continued skilled therapy to address remaining deficits.  ?  ?  ?OBJECTIVE IMPAIRMENTS Abnormal gait, decreased activity tolerance, decreased balance, decreased cognition, decreased coordination, decreased endurance, decreased knowledge of use of DME, decreased mobility, difficulty walking, decreased strength, decreased safety awareness, impaired perceived functional ability, impaired UE functional use, and postural dysfunction.  ?  ?ACTIVITY LIMITATIONS cleaning, community activity, driving, meal prep, and laundry.  ?  ?PERSONAL FACTORS Time since onset of injury/illness/exacerbation and 3+ comorbidities: MS, OA, anxiety/depression  are also affecting patient's functional outcome.  ?  ?  ?  ?GOALS: ?Goals reviewed with patient? Yes ?  ?SHORT TERM GOALS: ?  ?STG Name Target Date Goal status  ?1 Pt/caregiver will be IND with HEP in  order to indicate improved functional mobility and dec fall risk. ?  ?Baseline:  03/04/2022 MET  ?2 Pt will perform sit<>stand to RW at min A level in order to reduce burden of care and make more IND at home.  ?Baseline:  03/04/2022 MET  ?3 Pt will perform stand pivot transfers both directions with RW at min A level in order to indicate improved functional mobility.  ?Baseline: 03/04/2022 MET  ?4 Pt will perform dynamic standing with single UE support x 5 mins at min A in order to complete ADLs at home.  ?Baseline: 03/04/2022 Partially met ?44mn w/BUE support   ?5 Pt will ambulate x 20' with RW and mod A in order to indicate improved functional mobility.  ?Baseline: 03/04/2022 MET  ?  ?LONG TERM GOALS:  ?  ?LTG  Name Target Date Goal status  ?1 Pt/caregiver will be IND with final HEP in order to indicate improved functional mobility and dec fall risk. ?  ?Baseline: 04/01/2022 Met with seated exercises, added standing 4/25  ?2 Pt will perform sit<>stand to RW at Mod I level in order to dec burden of care and improve functional mobility.  ?Baseline: 04/01/2022 MET  ?3 Pt will perform stand pivot transfers with RW at mod I level in order to indicate improved functional mobility.  ?Baseline: 04/01/2022 Partially met, S for safety and cues  ?4 Pt will tolerate standing x 5 mins with single UE support (alt) at S level in order to improve standing tolerance, strength and functional mobility.   ?Baseline: 04/01/2022 Partially met, able to stand 2 mins with intermittent UE support   ?5 Pt will ambulate x 75' with RW at min A level in order to indicate improved functional mobility.  ?Baseline: 04/01/2022 REVISED  ?  ?PLAN: ?PT FREQUENCY: 2x/week ?  ?PT DURATION: 8 weeks ?  ?PLANNED INTERVENTIONS: Therapeutic exercises, Therapeutic activity, Neuromuscular re-education, Balance training, Gait training, Patient/Family education, DME instructions, and Aquatic Therapy ?  ?PLAN FOR NEXT SESSION: assess remaining LTG and recert for 4 more  weeks, toe taps to step, glute/posterior chain strengthening exercises, SciFit w/BLEs only, standing tolerance, balance in // bars, gait training  ? ?  ? ?Cameron Sprang, PT, MPT ?[place]

## 2022-04-01 DIAGNOSIS — G35 Multiple sclerosis: Secondary | ICD-10-CM | POA: Diagnosis not present

## 2022-04-02 ENCOUNTER — Ambulatory Visit: Payer: Medicare HMO | Admitting: Physical Therapy

## 2022-04-02 DIAGNOSIS — G35 Multiple sclerosis: Secondary | ICD-10-CM | POA: Diagnosis not present

## 2022-04-03 DIAGNOSIS — G35 Multiple sclerosis: Secondary | ICD-10-CM | POA: Diagnosis not present

## 2022-04-03 NOTE — Progress Notes (Signed)
? ?Referring Provider ?Rehman, Areeg N, DO ?[address] ?[place],  [place] [address]  ? ?CC:  ?Chief Complaint  ?Patient presents with  ? Breast Problem  ?   ? ?Ebony Warren is an 46 y.o. female.  ?HPI: Patient is a 46 year old with history of macromastia s/p bilateral breast reduction with free nipple graft performed 05/12/2021 by Dr. Arita Miss who presents to clinic with questions or concerns. ? ?She was last seen here in clinic on 09/03/2021.  At that time, her incisions had healed nicely, but there were a few scattered areas where stitches were spitting.  They were removed without complication or difficulty.  Nipple grafts were bit pink in the center, but anticipate that the pigmentation would improve with time.  No specific follow-up was needed. ? ?Today, she complains of white bumps near right nipple.  States that she has been applying Vaseline without improvement.  States that it has been rather itchy, but not painful.  Denies any other areas of concern.  There has been continued changes in pigment of NAC's bilaterally, predominantly on the left side.  She states the right side remains mostly pink.  She states that she has had excellent improvement in her bilateral shoulder discomfort since her breast reduction surgery and is overall pleased with her outcome. ? ? ?Allergies  ?Allergen Reactions  ? Hydrocodone Itching  ?  Stomach cramps  ? Morphine Itching  ? Orange Fruit [Citrus]   ?  Acidic foods Makes her itchy and her face "feels like its on fire", have rash.  ? Oxycodone Itching  ?  rash  ? Other Rash  ?  Narcotics. ?Spicy foods (cause seizures)  ? ? ?Outpatient Encounter Medications as of 04/06/2022  ?Medication Sig Note  ? ARIPiprazole (ABILIFY) 5 MG tablet Take 5 mg by mouth daily. 06/19/2021: Pt is taking this medication  ? baclofen (LIORESAL) 20 MG tablet Take 1 tablet (20 mg total) by mouth 3 (three) times daily.   ? cholecalciferol (VITAMIN D) 1000 UNITS tablet Take 1,000 Units by mouth  daily.   ? diphenhydrAMINE (BENADRYL) 25 mg capsule Take 25 mg by mouth every 6 (six) hours as needed for allergies.   ? gabapentin (NEURONTIN) 300 MG capsule Take 2 capsules (600 mg total) by mouth 3 (three) times daily.   ? LORazepam (ATIVAN) 1 MG tablet Take 1 mg by mouth daily.   ? Multiple Vitamins-Minerals (MULTIVITAMIN ADULT PO) Take 1 tablet by mouth daily.   ? ocrelizumab (OCREVUS) 300 MG/10ML injection Infuse 600 mg IV Every 6 months 06/10/2021: 06/10/21 Medicare PArt D approved 12/07/20 - 06/10/2022.  ?  ? pantoprazole (PROTONIX) 40 MG tablet TAKE 1 TABLET(40 MG) BY MOUTH DAILY 06/19/2021: Takes as needed  ? tiZANidine (ZANAFLEX) 4 MG tablet TAKE 1 TABLET(4 MG) BY MOUTH THREE TIMES DAILY   ? DULoxetine (CYMBALTA) 30 MG capsule Take 1 capsule (30 mg total) by mouth daily.   ? [DISCONTINUED] Semaglutide, 1 MG/DOSE, (OZEMPIC, 1 MG/DOSE,) 4 MG/3ML SOPN Inject 2 mg as directed once a week.   ? [DISCONTINUED] spironolactone (ALDACTONE) 25 MG tablet Take 25 mg by mouth daily.   ? ?No facility-administered encounter medications on file as of 04/06/2022.  ?  ? ?Past Medical History:  ?Diagnosis Date  ? Anxiety and depression   ? Arthritis   ? Bradycardia   ? Encounter to establish care 01/22/2021  ? Family history of adverse reaction to anesthesia   ? nausea  ? GERD (gastroesophageal reflux disease)   ?  Headache   ? Multiple sclerosis ([facility])   ? Screening mammogram, encounter for 01/22/2021  ? Seizures ([facility])   ? told that they are not from the brain but stress related  ? ? ?Past Surgical History:  ?Procedure Laterality Date  ? BREAST REDUCTION SURGERY Bilateral 06/23/2021  ? Procedure: MAMMARY REDUCTION  (BREAST);  Surgeon: Allena Napoleon, MD;  Location[facility];  Service: Plastics;  Laterality: Bilateral;  ? CHOLECYSTECTOMY    ? KNEE SURGERY Bilateral   ? Arthroscopy  ? ? ?Family History  ?Problem Relation Age of Onset  ? Hypertension Mother   ? Atrial fibrillation Mother   ? Hypertension Father   ? Atrial fibrillation  Maternal Grandmother   ? ? ?Social History  ? ?Social History Narrative  ? Patient lives at home with family.  ? Caffeine Use: drinks a cup of hot tea with tecfidera in the am  ? Patient is right handed.  ? Patient has a college education  ?  ? ?Review of Systems ?General: Denies fevers or chills ?Skin: Endorses itching ? ?Physical Exam ? ?  01/08/2022  ? 10:51 AM 01/08/2022  ? 10:45 AM 12/11/2021  ?  1:42 PM  ?Vitals with BMI  ?Height  5\' 8"    ?Weight  273 lbs 11 oz   ?BMI  41.63   ?Systolic 136 137  ?Diastolic 91 94 81  ?Pulse 71 68 90  ?  ?General:  No acute distress, nontoxic appearing  ?Respiratory: No increased work of breathing ?Neuro: Alert and oriented ?Psychiatric: Normal mood and affect  ?Breast exam: Good shape and symmetry of breasts.  Soft.  Incisions around NAC's, vertical limb, and inframammary incisions all appear to be well-healed.  No wounds noted.  Area of dryness, scaling, and peeling over middle/right side of right breast areola.  Flaking noted.  No surrounding erythema or induration.  No cellulitic changes. ? ?Assessment/Plan ? ?Patient has been experiencing dryness and flaking over right NAC which is consistent with the report of itching.  Does not appear to be fungal.  No cellulitic changes. ? ?Advised that she apply Vaseline twice daily.  No significant contour irregularity that would be putting her at predisposition for fungal infection or other chronic rashes.  We also discussed applying topical Mederma or other scar treatments to the healed incisional scars.  Given that this is an old scar, recommend that they apply Mederma once daily at night x4 months. ? ?Patient to call clinic should she have any additional questions or concerns.  Picture(s) obtained of the patient and placed in the chart were with the patient's or guardian's permission. ? ? ?270 ?04/06/2022, 12:33 PM  ? ? ?  ? ?

## 2022-04-06 ENCOUNTER — Ambulatory Visit (INDEPENDENT_AMBULATORY_CARE_PROVIDER_SITE_OTHER): Payer: Medicare HMO | Admitting: Physician Assistant

## 2022-04-06 DIAGNOSIS — G35 Multiple sclerosis: Secondary | ICD-10-CM | POA: Diagnosis not present

## 2022-04-06 DIAGNOSIS — Z9889 Other specified postprocedural states: Secondary | ICD-10-CM | POA: Diagnosis not present

## 2022-04-07 DIAGNOSIS — G35 Multiple sclerosis: Secondary | ICD-10-CM | POA: Diagnosis not present

## 2022-04-08 DIAGNOSIS — G35 Multiple sclerosis: Secondary | ICD-10-CM | POA: Diagnosis not present

## 2022-04-09 DIAGNOSIS — G35 Multiple sclerosis: Secondary | ICD-10-CM | POA: Diagnosis not present

## 2022-04-10 DIAGNOSIS — G35 Multiple sclerosis: Secondary | ICD-10-CM | POA: Diagnosis not present

## 2022-04-13 DIAGNOSIS — G35 Multiple sclerosis: Secondary | ICD-10-CM | POA: Diagnosis not present

## 2022-04-14 DIAGNOSIS — G35 Multiple sclerosis: Secondary | ICD-10-CM | POA: Diagnosis not present

## 2022-04-15 DIAGNOSIS — G35 Multiple sclerosis: Secondary | ICD-10-CM | POA: Diagnosis not present

## 2022-04-16 DIAGNOSIS — G35 Multiple sclerosis: Secondary | ICD-10-CM | POA: Diagnosis not present

## 2022-04-17 DIAGNOSIS — G35 Multiple sclerosis: Secondary | ICD-10-CM | POA: Diagnosis not present

## 2022-04-19 IMAGING — DX DG KNEE COMPLETE 4+V*L*
4 series · 4 of 4 positions shown · non-contrast
Comparison: X-ray knee 06/01/2019.

CLINICAL DATA: Knee pain following fall.

EXAM:
LEFT KNEE - COMPLETE 4+ VIEW

[t knee ap left]
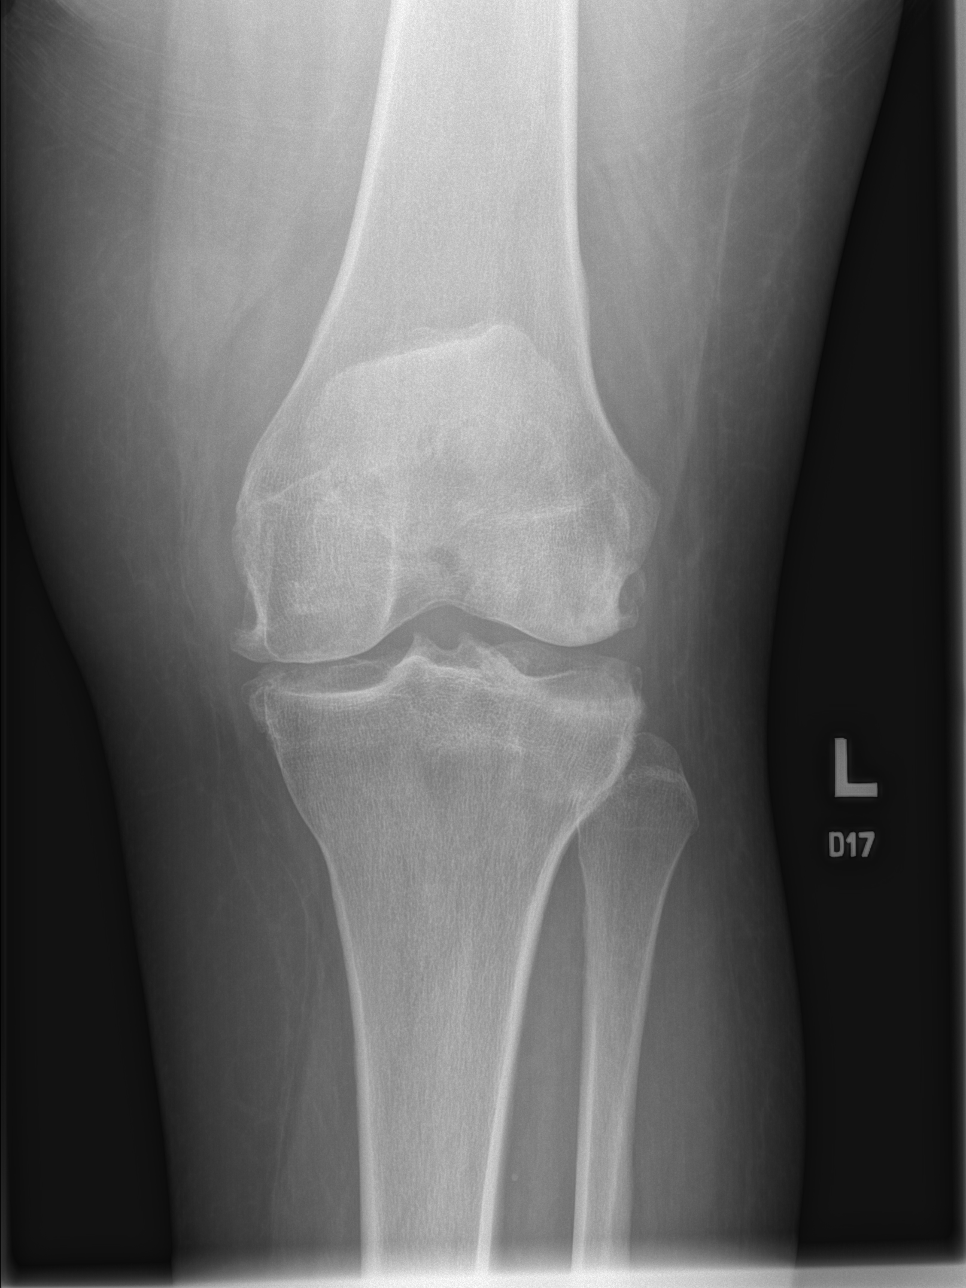

[t knee obl left (1 of 2)]
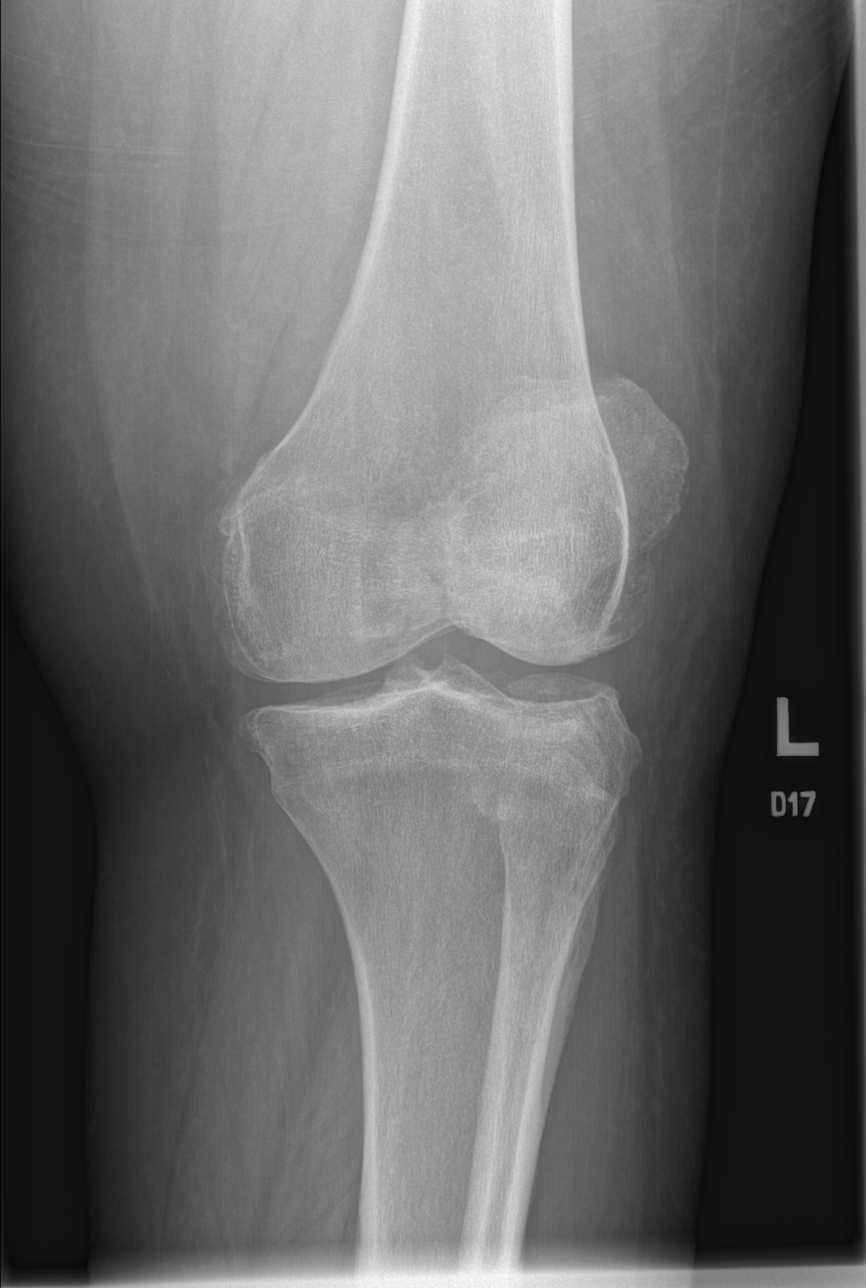

[t knee obl left (2 of 2)]
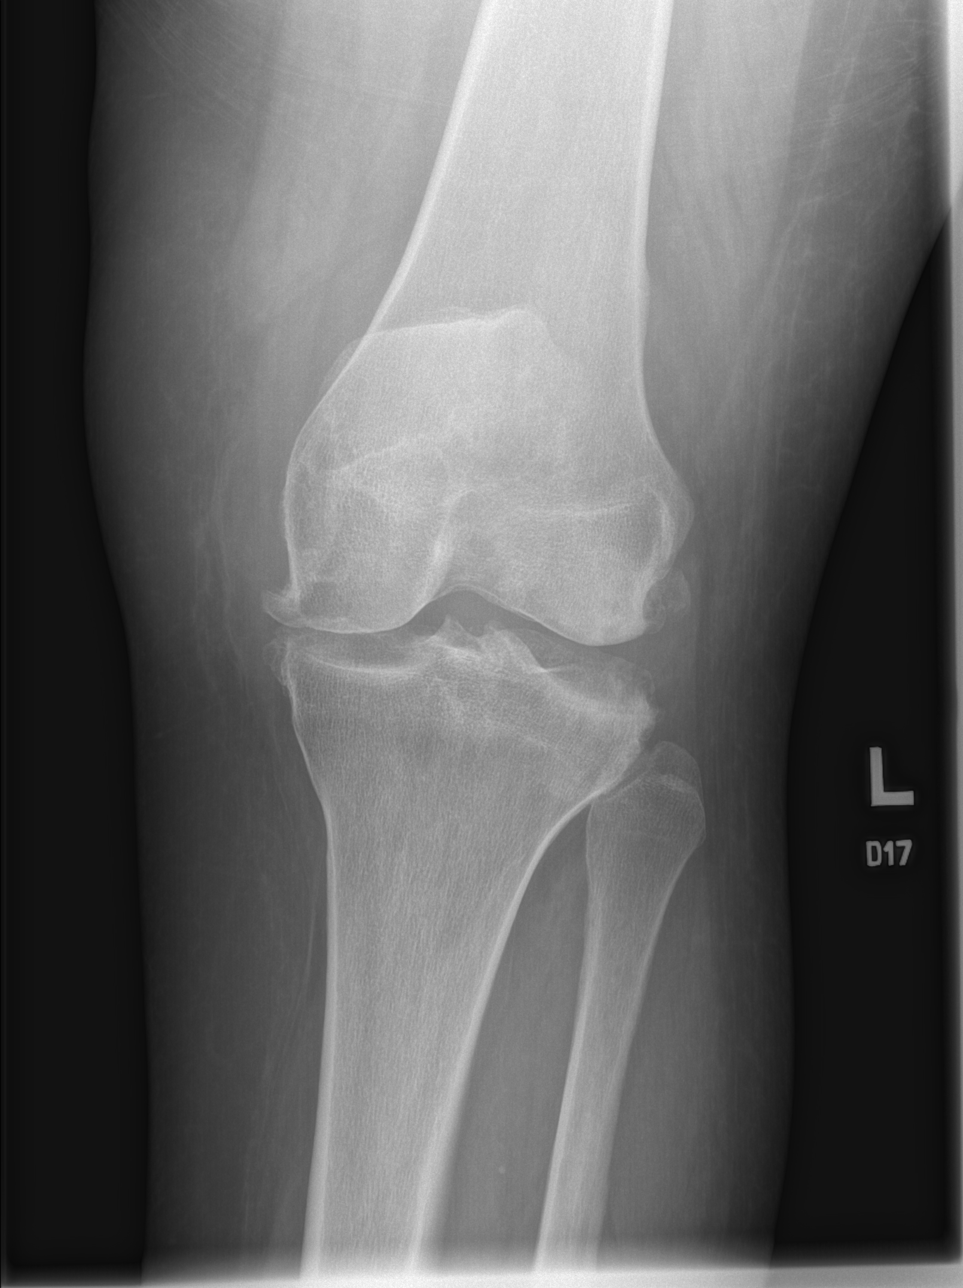

[t knee lat left]
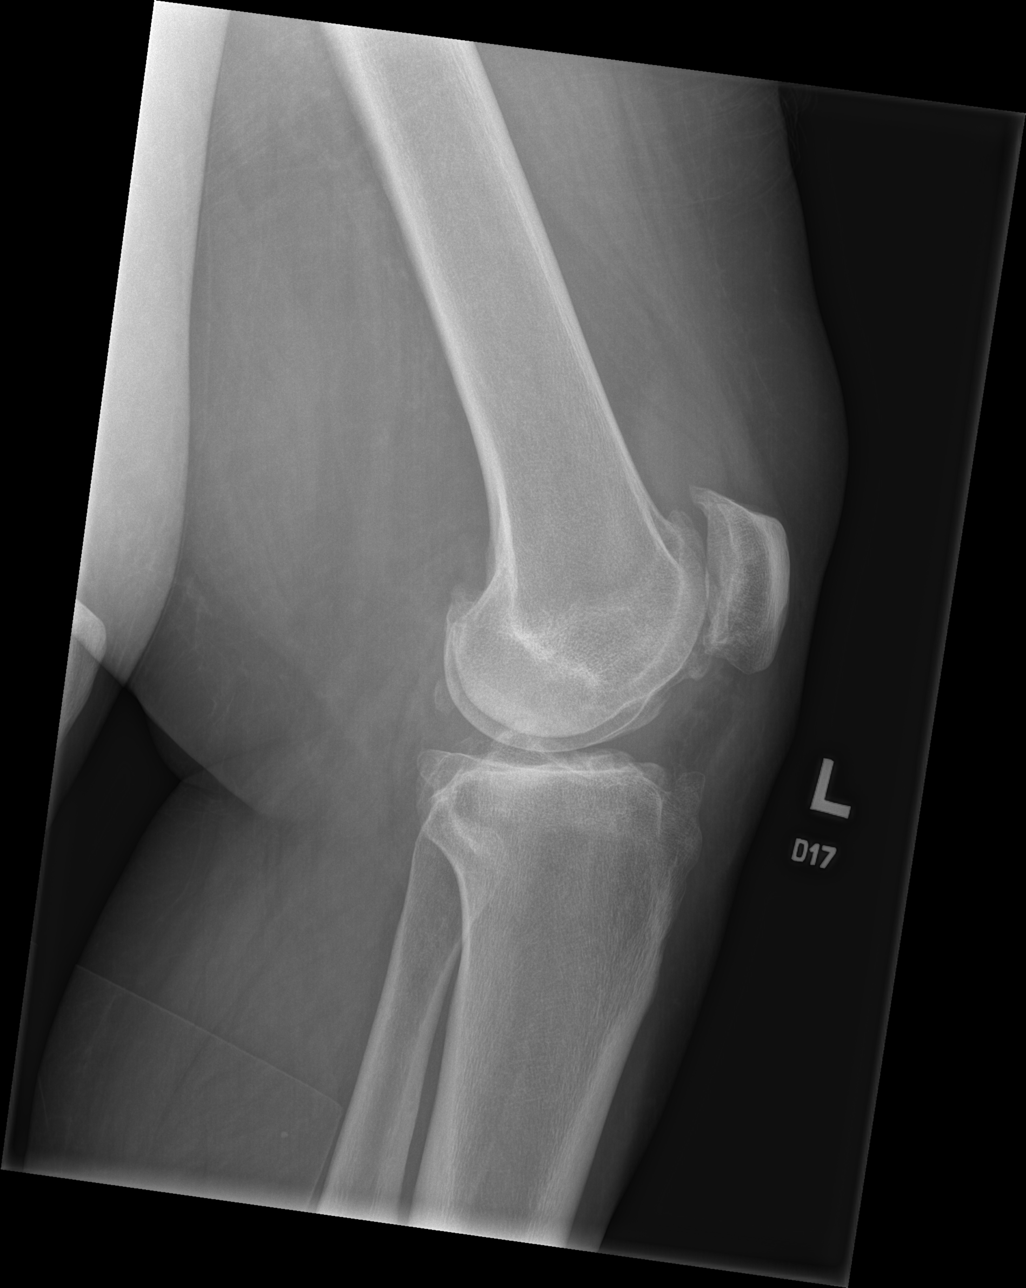

[4 of 4 positions shown; findings below may reference images not displayed]

FINDINGS: No acute fracture or dislocation. Moderate to severe
tricompartmental degenerative changes are present at the knee and
most pronounced in the medial and patellofemoral compartments. There
is a small suprapatellar joint effusion. The soft tissues are
otherwise within normal limits.
IMPRESSION: 1. No acute fracture or dislocation.
2. Moderate to severe osteoarthritis.
3. Small suprapatellar joint effusion.

## 2022-04-20 DIAGNOSIS — G35 Multiple sclerosis: Secondary | ICD-10-CM | POA: Diagnosis not present

## 2022-04-21 ENCOUNTER — Ambulatory Visit: Payer: Medicare HMO | Attending: Internal Medicine | Admitting: Rehabilitation

## 2022-04-21 ENCOUNTER — Encounter: Payer: Self-pay | Admitting: Rehabilitation

## 2022-04-21 DIAGNOSIS — R293 Abnormal posture: Secondary | ICD-10-CM | POA: Insufficient documentation

## 2022-04-21 DIAGNOSIS — R296 Repeated falls: Secondary | ICD-10-CM | POA: Insufficient documentation

## 2022-04-21 DIAGNOSIS — R2681 Unsteadiness on feet: Secondary | ICD-10-CM | POA: Insufficient documentation

## 2022-04-21 DIAGNOSIS — M6281 Muscle weakness (generalized): Secondary | ICD-10-CM | POA: Diagnosis not present

## 2022-04-21 DIAGNOSIS — G35 Multiple sclerosis: Secondary | ICD-10-CM | POA: Diagnosis not present

## 2022-04-21 DIAGNOSIS — R2689 Other abnormalities of gait and mobility: Secondary | ICD-10-CM | POA: Diagnosis not present

## 2022-04-21 NOTE — Therapy (Signed)
?OUTPATIENT PHYSICAL THERAPY TREATMENT NOTE ? ? ?Patient Name: Ebony Warren ?MRN: [id number] ?DOB:08/15/1976, 46 y.o., female ?Today's Date: 04/21/2022 ? ?PCP: Jaci Standard, DO ?REFERRING PROVIDER: Miguel Aschoff, MD  ? ? ? PT End of Session - 04/21/22 1407   ? ? Visit Number 15   ? Number of Visits 17   ? Date for PT Re-Evaluation 04/05/22   ? Authorization Type Aetna Medicare/Medicaid   ? Progress Note Due on Visit 10   ? Equipment Utilized During Treatment Gait belt   ? Activity Tolerance Patient tolerated treatment well   ? Behavior During Therapy [facility] for tasks assessed/performed   ? ?  ?  ? ?  ? ? ? ? ? ? ? ?Past Medical History:  ?Diagnosis Date  ? Anxiety and depression   ? Arthritis   ? Bradycardia   ? Encounter to establish care 01/22/2021  ? Family history of adverse reaction to anesthesia   ? nausea  ? GERD (gastroesophageal reflux disease)   ? Headache   ? Multiple sclerosis ([facility])   ? Screening mammogram, encounter for 01/22/2021  ? Seizures ([facility])   ? told that they are not from the brain but stress related  ? ?Past Surgical History:  ?Procedure Laterality Date  ? BREAST REDUCTION SURGERY Bilateral 06/23/2021  ? Procedure: MAMMARY REDUCTION  (BREAST);  Surgeon: Allena Napoleon, MD;  Location: [facility];  Service: Plastics;  Laterality: Bilateral;  ? CHOLECYSTECTOMY    ? KNEE SURGERY Bilateral   ? Arthroscopy  ? ?Patient Active Problem List  ? Diagnosis Date Noted  ? Fall 01/09/2022  ? Cramp and spasm 08/12/2021  ? Restless leg syndrome 08/12/2021  ? Healthcare maintenance 08/12/2021  ? Macromastia 06/23/2021  ? Gastroesophageal reflux disease 01/22/2021  ? COVID-19 virus infection 01/22/2021  ? Morbid obesity ([facility]) 01/22/2021  ? Right optic neuritis 05/13/2016  ? Slurred speech 01/27/2016  ? Other fatigue 01/27/2016  ? Depression 01/27/2016  ? Absence of bladder continence 01/27/2016  ? Multiple sclerosis ([facility]) 10/31/2014  ? Hemiplegia, unspecified, affecting dominant side 08/30/2014   ? ? ?REFERRING DIAG: W19.Lorne Skeens (ICD-10-CM) - Fall, initial encounter   ? ?THERAPY DIAG:  ?Muscle weakness (generalized) ? ?Unsteadiness on feet ? ?Other abnormalities of gait and mobility ? ?Repeated falls ? ?PERTINENT HISTORY: MS, OA, anxiety/depression  ? ?PRECAUTIONS: Fall ? ?SUBJECTIVE: Pt doing well and has been working on standing at home facing bed (has hospital bed that she elevates to use at "counter".   ? ?PAIN:  ?Are you having pain? Yes ?Pain location: RLE ?Pain intensity: 4/10 ?   ? ?TODAY'S TREATMENT:  ? ?Gait training ?Gait pattern:  bilateral genu valgus, step through pattern, decreased stride length, decreased hip/knee flexion- Right, decreased hip/knee flexion- Left, decreased ankle dorsiflexion- Right, decreased ankle dorsiflexion- Left, trunk flexed, poor foot clearance- Right, and poor foot clearance- Left ?Pt ambulated x 115' today without resting! PT did follow with her power chair in case of fatigue.  Min cues for upright posture and keeping RW a little closer to her.  Encouraged her to ambulate short distances at home if having a good day.  We discussed her having chairs in kitchen or using shower seat to sit on for rest if needed.   ? ? ? ?TA: Re-Assessed sit<>stand and stand pivot transfers for LTG assessment.  She is able to perform sit<>stand with RW at mod I level, and today was able to transfer safely without cues.  ? ?NMR: Performed dynamic standing  balance at counter top today, doing varying tasks from reaching laterally to retrieve cups, place on opposite side, reaching up into counter top, side stepping all with intermittent single UE support (side stepping requires BUE support) x 5 mins today meeting STG.  Again, we discussed how this needs to transfer to function at home and if she is ambulating into restroom, working on standing to perform grooming ADLs, brushing teeth, etc.  Also discussed her standing to fold laundry, however she reports she is very particular in how she  folds, but would still like to work on her doing bilateral UE task while standing.   ? ?  ?PATIENT EDUCATION: ?Education details: see self-care section  ?Person educated: Patient  ?Education method: Explanation and handout ?Education comprehension: verbalized understanding  ?  ?  ?HOME EXERCISE PROGRAM: ?Access Code: [email or web address] ?URL: [email or web address] ?Date: 02/25/2022 ?Prepared by: Alethia Berthold Plaster ? ?Exercises ?Seated March - 1 x daily - 7 x weekly - 3 sets - 10 reps ?Seated Hip Adduction Squeeze with Ball - 1 x daily - 7 x weekly - 3 sets - 10 reps - 3s hold ?Seated march with opposite shoulder raise - 1 x daily - 7 x weekly - 3 sets - 10 reps ?Seated Chair Push Ups - 1 x daily - 7 x weekly - 3 sets - 10 reps - 3s hold ?Proper Sit to Stand Technique - 1 x daily - 7 x weekly - 3 sets - 10 reps ? ? ?- Standing Weight Shift Side to Side  - 1 x daily - 7 x weekly - 2 sets - 5 reps ?- Side Stepping with Counter Support  - 1 x daily - 7 x weekly - 2 sets - 5 reps ?- Lateral Weight Shift with Arm Raise and Walker  - 1 x daily - 7 x weekly - 2 sets - 5 reps ?- Mini Squat with Counter Support  - 1 x daily - 7 x weekly - 2 sets - 5 reps ? ?  ?ASSESSMENT: ?  ?CLINICAL IMPRESSION: ?Session focused on assessment of remaining goals to update plan for 4 more weeks.   Will update goals to indicate more functional tasks.  Pt making excellent progression.  ?  ?  ?OBJECTIVE IMPAIRMENTS Abnormal gait, decreased activity tolerance, decreased balance, decreased cognition, decreased coordination, decreased endurance, decreased knowledge of use of DME, decreased mobility, difficulty walking, decreased strength, decreased safety awareness, impaired perceived functional ability, impaired UE functional use, and postural dysfunction.  ?  ?ACTIVITY LIMITATIONS cleaning, community activity, driving, meal prep, and laundry.  ?  ?PERSONAL FACTORS Time since onset of injury/illness/exacerbation and 3+ comorbidities: MS, OA,  anxiety/depression  are also affecting patient's functional outcome.  ?  ?  ?  ?GOALS: ?Goals reviewed with patient? Yes ?  ? ?  ?LONG TERM GOALS:  ?  ?LTG Name Target Date Goal status  ?1 Pt/caregiver will be IND with final HEP in order to indicate improved functional mobility and dec fall risk. ?  ?Baseline: 05/21/2022 Ongoing  ?     ?3 Pt will report performing stand pivot transfers at home with RW at mod I level consistently in order to indicate improved functional mobility.  ?Baseline: 05/21/2022 REVISED   ?4 Pt will be able to stand x 5 mins doing bilateral UE task (with intermittent support for rest if needed) to do tasks such as folding laundry, performing kitchen tasks, and grooming ADLs.  ?Baseline: 05/21/2022 REVISED   ?5 Pt will ambulate x 37' with  RW at S while negotiating around obstacles and making turns in order to indicate improved functional mobility in her home.  ?Baseline: 05/21/2022 REVISED  ?  ?PLAN: ?PT FREQUENCY: 2x/week ?  ?PT DURATION: 4 weeks ?  ?PLANNED INTERVENTIONS: Therapeutic exercises, Therapeutic activity, Neuromuscular re-education, Balance training, Gait training, Patient/Family education, DME instructions, and Aquatic Therapy ?  ?PLAN FOR NEXT SESSION: Take her in kitchen and work on ADLs (maybe simple meal prep) laundry tasks, etc for LTGs, gait with making turns, going around obstacles, toe taps to step, glute/posterior chain strengthening exercises, SciFit w/BLEs only, standing tolerance, balance in // bars, gait training  ? ?  ? ?Harriet Butte, PT, MPT ?[facility] ?[address] ?[place], [place], [address] ?Phone: [phone]   Fax:  [phone] ?04/21/22, 2:08 PM ? ? ? ? ?   ?

## 2022-04-22 DIAGNOSIS — G35 Multiple sclerosis: Secondary | ICD-10-CM | POA: Diagnosis not present

## 2022-04-23 DIAGNOSIS — G35 Multiple sclerosis: Secondary | ICD-10-CM | POA: Diagnosis not present

## 2022-04-24 ENCOUNTER — Ambulatory Visit: Payer: Medicare HMO | Admitting: Physical Therapy

## 2022-04-24 DIAGNOSIS — G35 Multiple sclerosis: Secondary | ICD-10-CM | POA: Diagnosis not present

## 2022-04-27 ENCOUNTER — Ambulatory Visit: Payer: Medicare HMO | Admitting: Rehabilitation

## 2022-04-27 ENCOUNTER — Encounter: Payer: Self-pay | Admitting: Rehabilitation

## 2022-04-27 DIAGNOSIS — F329 Major depressive disorder, single episode, unspecified: Secondary | ICD-10-CM | POA: Diagnosis not present

## 2022-04-27 DIAGNOSIS — R2681 Unsteadiness on feet: Secondary | ICD-10-CM

## 2022-04-27 DIAGNOSIS — F411 Generalized anxiety disorder: Secondary | ICD-10-CM | POA: Diagnosis not present

## 2022-04-27 DIAGNOSIS — R293 Abnormal posture: Secondary | ICD-10-CM | POA: Diagnosis not present

## 2022-04-27 DIAGNOSIS — M6281 Muscle weakness (generalized): Secondary | ICD-10-CM

## 2022-04-27 DIAGNOSIS — R296 Repeated falls: Secondary | ICD-10-CM | POA: Diagnosis not present

## 2022-04-27 DIAGNOSIS — R2689 Other abnormalities of gait and mobility: Secondary | ICD-10-CM | POA: Diagnosis not present

## 2022-04-27 DIAGNOSIS — R69 Illness, unspecified: Secondary | ICD-10-CM | POA: Diagnosis not present

## 2022-04-27 DIAGNOSIS — G35 Multiple sclerosis: Secondary | ICD-10-CM | POA: Diagnosis not present

## 2022-04-27 DIAGNOSIS — Z79899 Other long term (current) drug therapy: Secondary | ICD-10-CM | POA: Diagnosis not present

## 2022-04-27 NOTE — Therapy (Signed)
OUTPATIENT PHYSICAL THERAPY TREATMENT NOTE   Patient Name: Ebony Warren MRN: [id number] DOB:Feb 16, 1976, 46 y.o., female Today's Date: 04/27/2022  PCP: Jaci Standard, DO REFERRING PROVIDER: Miguel Aschoff, MD     PT End of Session - 04/27/22 1403     Visit Number 16    Number of Visits 25   per updated POC   Date for PT Re-Evaluation 05/21/22   per updated POC   Authorization Type [employer] Medicare/Medicaid    Progress Note Due on Visit 20    PT Start Time 1403    PT Stop Time 1445    PT Time Calculation (min) 42 min    Equipment Utilized During Treatment Gait belt    Activity Tolerance Patient tolerated treatment well    Behavior During Therapy WFL for tasks assessed/performed                  Past Medical History:  Diagnosis Date   Anxiety and depression    Arthritis    Bradycardia    Encounter to establish care 01/22/2021   Family history of adverse reaction to anesthesia    nausea   GERD (gastroesophageal reflux disease)    Headache    Multiple sclerosis (HCC)    Screening mammogram, encounter for 01/22/2021   Seizures (HCC)    told that they are not from the brain but stress related   Past Surgical History:  Procedure Laterality Date   BREAST REDUCTION SURGERY Bilateral 06/23/2021   Procedure: MAMMARY REDUCTION  (BREAST);  Surgeon: Allena Napoleon, MD;  Location: [facility] OR;  Service: Plastics;  Laterality: Bilateral;   CHOLECYSTECTOMY     KNEE SURGERY Bilateral    Arthroscopy   Patient Active Problem List   Diagnosis Date Noted   Fall 01/09/2022   Cramp and spasm 08/12/2021   Restless leg syndrome 08/12/2021   Healthcare maintenance 08/12/2021   Macromastia 06/23/2021   Gastroesophageal reflux disease 01/22/2021   COVID-19 virus infection 01/22/2021   Morbid obesity (HCC) 01/22/2021   Right optic neuritis 05/13/2016   Slurred speech 01/27/2016   Other fatigue 01/27/2016   Depression 01/27/2016   Absence of bladder continence  01/27/2016   Multiple sclerosis (HCC) 10/31/2014   Hemiplegia, unspecified, affecting dominant side 08/30/2014    REFERRING DIAG: A26.Lorne Skeens (ICD-10-CM) - Fall, initial encounter    THERAPY DIAG:  Muscle weakness (generalized)  Unsteadiness on feet  Other abnormalities of gait and mobility  Repeated falls  Abnormal posture  PERTINENT HISTORY: MS, OA, anxiety/depression   PRECAUTIONS: Fall  SUBJECTIVE: Still working on standing at bed at home.   PAIN:  Are you having pain? Yes Pain location: RLE Pain intensity: 2/10     TODAY'S TREATMENT:   Gait training Gait pattern:  bilateral genu valgus, step through pattern, decreased stride length, decreased hip/knee flexion- Right, decreased hip/knee flexion- Left, decreased ankle dorsiflexion- Right, decreased ankle dorsiflexion- Left, trunk flexed, poor foot clearance- Right, and poor foot clearance- Left   Very minimal gait today just in ADL kitchen for up to 10' at a time.        TE: Ended session with seated scifit with BUEs/LEs x 6 mins at level 1 resistance.  Attempted level 2 initially however it was too difficult today.  Pt tolerated well and was able to increase steps per min to 50-60's.    NMR: Worked on standing balance and tolerance in ADL kitchen.  Went over varying scenarios and pt is unable to get into kitchen  with her RW due to large dog crate and is not able to get to laundry area due to being too tight.  PT did have her walk to washer/dryer during session however to work on lateral weight shifts, intermittent UE support while reaching and loading clothing into washer then into dryer.  Pt needing several seated rest breaks today due to fatigue and pain in R knee.  Pt performed 5-7 bouts of standing for approx 1-2 mins each.  Had most difficulty loading dryer due to having to bend slightly then return to standing.     PATIENT EDUCATION: Education details: see self-care section  Person educated: Patient  Education  method: Explanation and handout Education comprehension: verbalized understanding      HOME EXERCISE PROGRAM: Access Code: [email or web address] URL: [email or web address] Date: 02/25/2022 Prepared by: Alethia Berthold Plaster  Exercises Seated March - 1 x daily - 7 x weekly - 3 sets - 10 reps Seated Hip Adduction Squeeze with Ball - 1 x daily - 7 x weekly - 3 sets - 10 reps - 3s hold Seated march with opposite shoulder raise - 1 x daily - 7 x weekly - 3 sets - 10 reps Seated Chair Push Ups - 1 x daily - 7 x weekly - 3 sets - 10 reps - 3s hold Proper Sit to Stand Technique - 1 x daily - 7 x weekly - 3 sets - 10 reps   - Standing Weight Shift Side to Side  - 1 x daily - 7 x weekly - 2 sets - 5 reps - Side Stepping with Counter Support  - 1 x daily - 7 x weekly - 2 sets - 5 reps - Lateral Weight Shift with Arm Raise and Walker  - 1 x daily - 7 x weekly - 2 sets - 5 reps - Mini Squat with Counter Support  - 1 x daily - 7 x weekly - 2 sets - 5 reps    ASSESSMENT:   CLINICAL IMPRESSION: Session focused on standing balance and tolerance with functional ADL tasks in kitchen and laundry area.  Pt having more fatigue and R LE pain today and needed several seated break during tasks and demos very slow gait pattern and transfers today.      OBJECTIVE IMPAIRMENTS Abnormal gait, decreased activity tolerance, decreased balance, decreased cognition, decreased coordination, decreased endurance, decreased knowledge of use of DME, decreased mobility, difficulty walking, decreased strength, decreased safety awareness, impaired perceived functional ability, impaired UE functional use, and postural dysfunction.    ACTIVITY LIMITATIONS cleaning, community activity, driving, meal prep, and laundry.    PERSONAL FACTORS Time since onset of injury/illness/exacerbation and 3+ comorbidities: MS, OA, anxiety/depression  are also affecting patient's functional outcome.        GOALS: Goals reviewed with patient?  Yes      LONG TERM GOALS:    LTG Name Target Date Goal status  1 Pt/caregiver will be IND with final HEP in order to indicate improved functional mobility and dec fall risk.   Baseline: 05/21/2022 Ongoing       3 Pt will report performing stand pivot transfers at home with RW at mod I level consistently in order to indicate improved functional mobility.  Baseline: 05/21/2022 REVISED   4 Pt will be able to stand x 5 mins doing bilateral UE task (with intermittent support for rest if needed) to do tasks such as folding laundry, performing kitchen tasks, and grooming ADLs.  Baseline: 05/21/2022 REVISED   5 Pt will ambulate  x 77' with RW at S while negotiating around obstacles and making turns in order to indicate improved functional mobility in her home.  Baseline: 05/21/2022 REVISED    PLAN: PT FREQUENCY: 2x/week   PT DURATION: 4 weeks   PLANNED INTERVENTIONS: Therapeutic exercises, Therapeutic activity, Neuromuscular re-education, Balance training, Gait training, Patient/Family education, DME instructions, and Aquatic Therapy   PLAN FOR NEXT SESSION: Take her in kitchen and work on ADLs (maybe simple meal prep) laundry tasks, etc for LTGs, gait with making turns, going around obstacles, toe taps to step, glute/posterior chain strengthening exercises, SciFit w/BLEs only, standing tolerance, balance in // bars, gait training      Harriet Butte, PT, MPT [facility] [address] [place], [place], [address] Phone: [phone]   Fax:  [phone] 04/27/22, 3:58 PM

## 2022-04-28 ENCOUNTER — Ambulatory Visit: Payer: Medicare HMO | Admitting: Rehabilitation

## 2022-04-28 DIAGNOSIS — G35 Multiple sclerosis: Secondary | ICD-10-CM | POA: Diagnosis not present

## 2022-04-29 DIAGNOSIS — G35 Multiple sclerosis: Secondary | ICD-10-CM | POA: Diagnosis not present

## 2022-04-30 ENCOUNTER — Ambulatory Visit: Payer: Medicare HMO | Admitting: Physical Therapy

## 2022-04-30 DIAGNOSIS — M6281 Muscle weakness (generalized): Secondary | ICD-10-CM

## 2022-04-30 DIAGNOSIS — R2681 Unsteadiness on feet: Secondary | ICD-10-CM

## 2022-04-30 DIAGNOSIS — R2689 Other abnormalities of gait and mobility: Secondary | ICD-10-CM

## 2022-04-30 DIAGNOSIS — G35 Multiple sclerosis: Secondary | ICD-10-CM | POA: Diagnosis not present

## 2022-04-30 DIAGNOSIS — R293 Abnormal posture: Secondary | ICD-10-CM | POA: Diagnosis not present

## 2022-04-30 DIAGNOSIS — R296 Repeated falls: Secondary | ICD-10-CM | POA: Diagnosis not present

## 2022-04-30 NOTE — Therapy (Signed)
OUTPATIENT PHYSICAL THERAPY TREATMENT NOTE   Patient Name: Ebony Warren MRN: [id number] DOB:22-May-1976, 46 y.o., female Today's Date: 04/30/2022  PCP: Jaci Standard, DO REFERRING PROVIDER: Miguel Aschoff, MD     PT End of Session - 04/30/22 1318     Visit Number 17    Number of Visits 25   per updated POC   Date for PT Re-Evaluation 05/21/22   per updated POC   Authorization Type [employer] Medicare/Medicaid    Progress Note Due on Visit 20    PT Start Time 1316    PT Stop Time 1401    PT Time Calculation (min) 45 min    Equipment Utilized During Treatment Gait belt    Activity Tolerance Patient tolerated treatment well    Behavior During Therapy WFL for tasks assessed/performed                   Past Medical History:  Diagnosis Date   Anxiety and depression    Arthritis    Bradycardia    Encounter to establish care 01/22/2021   Family history of adverse reaction to anesthesia    nausea   GERD (gastroesophageal reflux disease)    Headache    Multiple sclerosis (HCC)    Screening mammogram, encounter for 01/22/2021   Seizures (HCC)    told that they are not from the brain but stress related   Past Surgical History:  Procedure Laterality Date   BREAST REDUCTION SURGERY Bilateral 06/23/2021   Procedure: MAMMARY REDUCTION  (BREAST);  Surgeon: Allena Napoleon, MD;  Location: [facility] OR;  Service: Plastics;  Laterality: Bilateral;   CHOLECYSTECTOMY     KNEE SURGERY Bilateral    Arthroscopy   Patient Active Problem List   Diagnosis Date Noted   Fall 01/09/2022   Cramp and spasm 08/12/2021   Restless leg syndrome 08/12/2021   Healthcare maintenance 08/12/2021   Macromastia 06/23/2021   Gastroesophageal reflux disease 01/22/2021   COVID-19 virus infection 01/22/2021   Morbid obesity (HCC) 01/22/2021   Right optic neuritis 05/13/2016   Slurred speech 01/27/2016   Other fatigue 01/27/2016   Depression 01/27/2016   Absence of bladder  continence 01/27/2016   Multiple sclerosis (HCC) 10/31/2014   Hemiplegia, unspecified, affecting dominant side 08/30/2014    REFERRING DIAG: W09.Lorne Skeens (ICD-10-CM) - Fall, initial encounter    THERAPY DIAG:  Muscle weakness (generalized)  Unsteadiness on feet  Other abnormalities of gait and mobility  PERTINENT HISTORY: MS, OA, anxiety/depression   PRECAUTIONS: Fall  SUBJECTIVE: Pt reports she is working on standing at home, "it is going, slowly but surely"   PAIN:  Are you having pain? No Pain location:  Pain intensity:      TODAY'S TREATMENT:   Gait training Gait pattern:  bilateral genu valgus, step through pattern, decreased hip/knee flexion- Right, decreased hip/knee flexion- Left, decreased ankle dorsiflexion- Right, decreased ankle dorsiflexion- Left, Right foot flat, Left foot flat, trunk flexed, poor foot clearance- Right, and poor foot clearance- Left Distance walked: 115', 67' and 58'  Assistive device utilized: Environmental consultant - 2 wheeled Level of assistance: SBA Comments: Noted increased reliance on BUEs throughout, min cues for upright posture. Pt demonstrated increased step clearance throughout and maintained cadence.    Ther Ex:  Pt ambulated to SciFit w/RW, min A to turn and sit on seat. Pt performed 2:45 minutes on SciFit level 1.5 using BUE/BLEs for dynamic cardiovascular conditioning and global strength. Pt reported right knee pain w/activity and requested to stop.  Sit <>stand from SciFit w/min A for trunk extension support and pt performed stand pivot to power chair w/RW and min guard 2/2 fatigue. Noted pt not fulling turning prior to sitting, requiring assistance to guide hips to chair.     PATIENT EDUCATION: Education details: Continue to walk/stand at home for improved strength and stamina  Person educated: Patient  Education method: Explanation and handout Education comprehension: verbalized understanding      HOME EXERCISE PROGRAM: Access Code:  [email or web address] URL: [email or web address] Date: 02/25/2022 Prepared by: Alethia Berthold Shemekia Patane  Exercises Seated March - 1 x daily - 7 x weekly - 3 sets - 10 reps Seated Hip Adduction Squeeze with Ball - 1 x daily - 7 x weekly - 3 sets - 10 reps - 3s hold Seated march with opposite shoulder raise - 1 x daily - 7 x weekly - 3 sets - 10 reps Seated Chair Push Ups - 1 x daily - 7 x weekly - 3 sets - 10 reps - 3s hold Proper Sit to Stand Technique - 1 x daily - 7 x weekly - 3 sets - 10 reps   - Standing Weight Shift Side to Side  - 1 x daily - 7 x weekly - 2 sets - 5 reps - Side Stepping with Counter Support  - 1 x daily - 7 x weekly - 2 sets - 5 reps - Lateral Weight Shift with Arm Raise and Walker  - 1 x daily - 7 x weekly - 2 sets - 5 reps - Mini Squat with Counter Support  - 1 x daily - 7 x weekly - 2 sets - 5 reps    ASSESSMENT:   CLINICAL IMPRESSION: Emphasis of skilled PT session on gait training for endurance and global strengthening. Pt ambulated 230' total today, setting a new record for distance w/PT. Pt required shorter rest breaks throughout session and maintained cadence well. Pt continues to be limited by right knee pain but has improved overall stamina. Continue POC.      OBJECTIVE IMPAIRMENTS Abnormal gait, decreased activity tolerance, decreased balance, decreased cognition, decreased coordination, decreased endurance, decreased knowledge of use of DME, decreased mobility, difficulty walking, decreased strength, decreased safety awareness, impaired perceived functional ability, impaired UE functional use, and postural dysfunction.    ACTIVITY LIMITATIONS cleaning, community activity, driving, meal prep, and laundry.    PERSONAL FACTORS Time since onset of injury/illness/exacerbation and 3+ comorbidities: MS, OA, anxiety/depression  are also affecting patient's functional outcome.        GOALS: Goals reviewed with patient? Yes      LONG TERM GOALS:    LTG Name  Target Date Goal status  1 Pt/caregiver will be IND with final HEP in order to indicate improved functional mobility and dec fall risk.   Baseline: 05/21/2022 Ongoing       3 Pt will report performing stand pivot transfers at home with RW at mod I level consistently in order to indicate improved functional mobility.  Baseline: 05/21/2022 REVISED   4 Pt will be able to stand x 5 mins doing bilateral UE task (with intermittent support for rest if needed) to do tasks such as folding laundry, performing kitchen tasks, and grooming ADLs.  Baseline: 05/21/2022 REVISED   5 Pt will ambulate x 43' with RW at S while negotiating around obstacles and making turns in order to indicate improved functional mobility in her home.  Baseline: 05/21/2022 REVISED    PLAN: PT FREQUENCY: 2x/week   PT DURATION: 4 weeks  PLANNED INTERVENTIONS: Therapeutic exercises, Therapeutic activity, Neuromuscular re-education, Balance training, Gait training, Patient/Family education, DME instructions, and Aquatic Therapy   PLAN FOR NEXT SESSION: Take her in kitchen and work on ADLs (maybe simple meal prep) laundry tasks, etc for LTGs, gait with making turns, going around obstacles, toe taps to step, glute/posterior chain strengthening exercises, SciFit w/BLEs only, standing tolerance, balance in // bars, gait training     Tish Begin E Koi Zangara, PT, DPT 04/30/22, 2:05 PM

## 2022-05-01 DIAGNOSIS — G35 Multiple sclerosis: Secondary | ICD-10-CM | POA: Diagnosis not present

## 2022-05-04 DIAGNOSIS — G35 Multiple sclerosis: Secondary | ICD-10-CM | POA: Diagnosis not present

## 2022-05-05 DIAGNOSIS — G35 Multiple sclerosis: Secondary | ICD-10-CM | POA: Diagnosis not present

## 2022-05-06 ENCOUNTER — Ambulatory Visit: Payer: Medicare HMO | Admitting: Physical Therapy

## 2022-05-06 DIAGNOSIS — G35 Multiple sclerosis: Secondary | ICD-10-CM | POA: Diagnosis not present

## 2022-05-06 DIAGNOSIS — R2681 Unsteadiness on feet: Secondary | ICD-10-CM

## 2022-05-06 DIAGNOSIS — M6281 Muscle weakness (generalized): Secondary | ICD-10-CM

## 2022-05-06 DIAGNOSIS — R296 Repeated falls: Secondary | ICD-10-CM | POA: Diagnosis not present

## 2022-05-06 DIAGNOSIS — R293 Abnormal posture: Secondary | ICD-10-CM | POA: Diagnosis not present

## 2022-05-06 DIAGNOSIS — R2689 Other abnormalities of gait and mobility: Secondary | ICD-10-CM

## 2022-05-06 NOTE — Therapy (Signed)
OUTPATIENT PHYSICAL THERAPY TREATMENT NOTE   Patient Name: Ebony Warren MRN: [id number] DOB:1976-06-21, 46 y.o., female Today's Date: 05/06/2022  PCP: Jaci Standard, DO REFERRING PROVIDER: Miguel Aschoff, MD     PT End of Session - 05/06/22 1404     Visit Number 18    Number of Visits 25   per updated POC   Date for PT Re-Evaluation 05/21/22   per updated POC   Authorization Type [employer] Medicare/Medicaid    Progress Note Due on Visit 20    PT Start Time 1402    PT Stop Time 1443   Pt in restroom from 1425-1433   PT Time Calculation (min) 41 min    Equipment Utilized During Treatment --    Activity Tolerance Patient tolerated treatment well    Behavior During Therapy [facility] for tasks assessed/performed                    Past Medical History:  Diagnosis Date   Anxiety and depression    Arthritis    Bradycardia    Encounter to establish care 01/22/2021   Family history of adverse reaction to anesthesia    nausea   GERD (gastroesophageal reflux disease)    Headache    Multiple sclerosis (HCC)    Screening mammogram, encounter for 01/22/2021   Seizures (HCC)    told that they are not from the brain but stress related   Past Surgical History:  Procedure Laterality Date   BREAST REDUCTION SURGERY Bilateral 06/23/2021   Procedure: MAMMARY REDUCTION  (BREAST);  Surgeon: Allena Napoleon, MD;  Location: [facility] OR;  Service: Plastics;  Laterality: Bilateral;   CHOLECYSTECTOMY     KNEE SURGERY Bilateral    Arthroscopy   Patient Active Problem List   Diagnosis Date Noted   Fall 01/09/2022   Cramp and spasm 08/12/2021   Restless leg syndrome 08/12/2021   Healthcare maintenance 08/12/2021   Macromastia 06/23/2021   Gastroesophageal reflux disease 01/22/2021   COVID-19 virus infection 01/22/2021   Morbid obesity (HCC) 01/22/2021   Right optic neuritis 05/13/2016   Slurred speech 01/27/2016   Other fatigue 01/27/2016   Depression 01/27/2016    Absence of bladder continence 01/27/2016   Multiple sclerosis (HCC) 10/31/2014   Hemiplegia, unspecified, affecting dominant side 08/30/2014    REFERRING DIAG: I94.Lorne Skeens (ICD-10-CM) - Fall, initial encounter    THERAPY DIAG:  Muscle weakness (generalized)  Unsteadiness on feet  Other abnormalities of gait and mobility  PERTINENT HISTORY: MS, OA, anxiety/depression   PRECAUTIONS: Fall  SUBJECTIVE: Pt reports exercises are going well. "I am trying". No new changes   PAIN:  Are you having pain? Yes Pain location: RLE  Pain intensity: 2/10      TODAY'S TREATMENT:  NMR  Practiced standing at counter performing bilateral UE task for improved weightbearing tolerance, postural control and ADLs. Pt performed 3 sit <>stands from power chair to RW w/S* and tolerated 2 min, 2 min and 3:48 of standing and folding towels at counter. Towels placed sporadically on counter, requiring pt to reach laterally and into cabinets above. Pt reported 4/10 R calf pain throughout and required short seated rest break between trials. Pt states she "earned her W-2" performing task, therapist provided S* throughout.   Self-care/home management  Patient requested to use restroom in middle of session [phone]), in which therapist did not bill for time due to not assisting.   Gait training Gait pattern:  bilateral genu valgus, step through pattern,  decreased hip/knee flexion- Right, decreased hip/knee flexion- Left, decreased ankle dorsiflexion- Right, decreased ankle dorsiflexion- Left, Right foot flat, Left foot flat, trunk flexed, poor foot clearance- Right, and poor foot clearance- Left Distance walked: 43' and 12'  Assistive device utilized: Environmental consultant - 2 wheeled Level of assistance: SBA Comments: Noted decreased step clearance and length bilaterally 2/2 fatigue. Pt reported 7/10 pain in R thigh following gait training and 7/10 RPE for fatigue.     PATIENT EDUCATION: Education details: Continue to  walk/stand at home for improved strength and stamina  Person educated: Patient  Education method: Explanation and handout Education comprehension: verbalized understanding      HOME EXERCISE PROGRAM: Access Code: [email or web address] URL: [email or web address] Date: 02/25/2022 Prepared by: Alethia Berthold Josetta Wigal  Exercises Seated March - 1 x daily - 7 x weekly - 3 sets - 10 reps Seated Hip Adduction Squeeze with Ball - 1 x daily - 7 x weekly - 3 sets - 10 reps - 3s hold Seated march with opposite shoulder raise - 1 x daily - 7 x weekly - 3 sets - 10 reps Seated Chair Push Ups - 1 x daily - 7 x weekly - 3 sets - 10 reps - 3s hold Proper Sit to Stand Technique - 1 x daily - 7 x weekly - 3 sets - 10 reps   - Standing Weight Shift Side to Side  - 1 x daily - 7 x weekly - 2 sets - 5 reps - Side Stepping with Counter Support  - 1 x daily - 7 x weekly - 2 sets - 5 reps - Lateral Weight Shift with Arm Raise and Walker  - 1 x daily - 7 x weekly - 2 sets - 5 reps - Mini Squat with Counter Support  - 1 x daily - 7 x weekly - 2 sets - 5 reps    ASSESSMENT:   CLINICAL IMPRESSION: Emphasis of skilled PT session on standing tolerance without UE support and gait training. Pt tolerated standing for 2-3 minutes at a time with short seated rest breaks required in between sets due to R calf pain. Pt requested to use restroom in middle of session, therapist did not bill for that time. Continue POC.      OBJECTIVE IMPAIRMENTS Abnormal gait, decreased activity tolerance, decreased balance, decreased cognition, decreased coordination, decreased endurance, decreased knowledge of use of DME, decreased mobility, difficulty walking, decreased strength, decreased safety awareness, impaired perceived functional ability, impaired UE functional use, and postural dysfunction.    ACTIVITY LIMITATIONS cleaning, community activity, driving, meal prep, and laundry.    PERSONAL FACTORS Time since onset of  injury/illness/exacerbation and 3+ comorbidities: MS, OA, anxiety/depression  are also affecting patient's functional outcome.        GOALS: Goals reviewed with patient? Yes      LONG TERM GOALS:    LTG Name Target Date Goal status  1 Pt/caregiver will be IND with final HEP in order to indicate improved functional mobility and dec fall risk.   Baseline: 05/21/2022 Ongoing       3 Pt will report performing stand pivot transfers at home with RW at mod I level consistently in order to indicate improved functional mobility.  Baseline: 05/21/2022 REVISED   4 Pt will be able to stand x 5 mins doing bilateral UE task (with intermittent support for rest if needed) to do tasks such as folding laundry, performing kitchen tasks, and grooming ADLs.  Baseline: 05/21/2022 REVISED   5 Pt will ambulate x  2' with RW at S while negotiating around obstacles and making turns in order to indicate improved functional mobility in her home.  Baseline: 05/21/2022 REVISED    PLAN: PT FREQUENCY: 2x/week   PT DURATION: 4 weeks   PLANNED INTERVENTIONS: Therapeutic exercises, Therapeutic activity, Neuromuscular re-education, Balance training, Gait training, Patient/Family education, DME instructions, and Aquatic Therapy   PLAN FOR NEXT SESSION: Take her in kitchen and work on ADLs (maybe simple meal prep) laundry tasks, etc for LTGs, gait with making turns, going around obstacles, toe taps to step, glute/posterior chain strengthening exercises, SciFit w/BLEs only, standing tolerance, balance in // bars, gait training     Jill Alexanders Rexann Lueras, PT, DPT 05/06/22, 2:48 PM

## 2022-05-07 DIAGNOSIS — G35 Multiple sclerosis: Secondary | ICD-10-CM | POA: Diagnosis not present

## 2022-05-08 ENCOUNTER — Ambulatory Visit: Payer: Medicare HMO | Attending: Internal Medicine | Admitting: Physical Therapy

## 2022-05-08 DIAGNOSIS — R2681 Unsteadiness on feet: Secondary | ICD-10-CM | POA: Diagnosis not present

## 2022-05-08 DIAGNOSIS — M6281 Muscle weakness (generalized): Secondary | ICD-10-CM | POA: Diagnosis not present

## 2022-05-08 DIAGNOSIS — R2689 Other abnormalities of gait and mobility: Secondary | ICD-10-CM | POA: Diagnosis not present

## 2022-05-08 DIAGNOSIS — G35 Multiple sclerosis: Secondary | ICD-10-CM | POA: Diagnosis not present

## 2022-05-08 NOTE — Therapy (Addendum)
OUTPATIENT PHYSICAL THERAPY TREATMENT NOTE   Patient Name: Ebony Warren MRN: [id number] DOB:27-Apr-1976, 46 y.o., female Today's Date: 05/08/2022  PCP: Jaci Standard, DO REFERRING PROVIDER: Miguel Aschoff, MD     PT End of Session - 05/08/22 1402     Visit Number 19    Number of Visits 25   per updated POC   Date for PT Re-Evaluation 05/21/22   per updated POC   Authorization Type [employer]/Medicaid    Progress Note Due on Visit 20    PT Start Time 1401    PT Stop Time 1445    PT Time Calculation (min) 44 min    Activity Tolerance Patient tolerated treatment well;Patient limited by pain   R hip pain   Behavior During Therapy [facility] for tasks assessed/performed                    Past Medical History:  Diagnosis Date   Anxiety and depression    Arthritis    Bradycardia    Encounter to establish care 01/22/2021   Family history of adverse reaction to anesthesia    nausea   GERD (gastroesophageal reflux disease)    Headache    Multiple sclerosis (HCC)    Screening mammogram, encounter for 01/22/2021   Seizures (HCC)    told that they are not from the brain but stress related   Past Surgical History:  Procedure Laterality Date   BREAST REDUCTION SURGERY Bilateral 06/23/2021   Procedure: MAMMARY REDUCTION  (BREAST);  Surgeon: Allena Napoleon, MD;  Location: [facility] OR;  Service: Plastics;  Laterality: Bilateral;   CHOLECYSTECTOMY     KNEE SURGERY Bilateral    Arthroscopy   Patient Active Problem List   Diagnosis Date Noted   Fall 01/09/2022   Cramp and spasm 08/12/2021   Restless leg syndrome 08/12/2021   Healthcare maintenance 08/12/2021   Macromastia 06/23/2021   Gastroesophageal reflux disease 01/22/2021   COVID-19 virus infection 01/22/2021   Morbid obesity (HCC) 01/22/2021   Right optic neuritis 05/13/2016   Slurred speech 01/27/2016   Other fatigue 01/27/2016   Depression 01/27/2016   Absence of bladder continence 01/27/2016    Multiple sclerosis (HCC) 10/31/2014   Hemiplegia, unspecified, affecting dominant side 08/30/2014    REFERRING DIAG: Q94.Lorne Skeens (ICD-10-CM) - Fall, initial encounter    THERAPY DIAG:  Muscle weakness (generalized)  Unsteadiness on feet  Other abnormalities of gait and mobility  PERTINENT HISTORY: MS, OA, anxiety/depression   PRECAUTIONS: Fall  SUBJECTIVE: Pt reports she thinks she pulled a muscle in her R groin last night, woke up and it was painful. Unsure what caused it, potentially slept in weird position last night. Otherwise no new changes   PAIN:  Are you having pain? Yes Pain location: RLE (no pain in groin in seated position but gets up to a 6/10 w/weightbearing)  Pain intensity: 6/10      TODAY'S TREATMENT:   Pt performed sit <>stand pivot from power chair to high-low mat w/RW and min A due to "feeling imbalanced". Sit <>supine onto R side w/min A for RLE management.   Manual therapy  The following manual therapy techniques were performed to R hip for pain modulation and improved ROM: -Hip adductor stretch w/oscillation, x120s. No pain in this position -Hip flexor stretch, draped off edge of mat w/effleurage added to muscle belly, x120s. No pain in this position  -Scours, FADER and FABIR tests performed, all painful -Hip distraction in 90-90 position w/added  oscillation, 2x90s hold. Pt reported decrease in pain w/distraction   Pt performed supine <>sit w/min A for Rle management and sit <>stand pivot from low mat to power chair w/CGA using RW. Noted pt not turning all the way to sit and significantly rotating on RLE while sitting. Pt reports significant crepitus in R hip while turning to sit. Educated pt on being mindful on body positioning while seated, as she sits windswept to R side, and while performing transfers to reduce strain on R hip. Pt verbalized understanding.   NMR  Seated rebounder ball throws using small green ball for improved dynamic reaching, core  stability, visual tracking and UE strength. Pt able to throw and catch ball using bilateral hands to facilitate extension and grip strength of R hand. Progressed from chest throws to overhead throws in various directions to facilitate lateral reaching. Pt reported occasional discomfort in R hip while seated, noted pt sitting windswept to R side. Cued pt to straighten hips in seat and pt reported decrease in pain.     PATIENT EDUCATION: Education details: Being mindful of body positioning w/transfers and while seated in chair for reduced hip pain.  Person educated: Patient  Education method: Explanation and handout Education comprehension: verbalized understanding      HOME EXERCISE PROGRAM: Access Code: [email or web address] URL: [email or web address] Date: 02/25/2022 Prepared by: Alethia Berthold Ashaunte Standley  Exercises Seated March - 1 x daily - 7 x weekly - 3 sets - 10 reps Seated Hip Adduction Squeeze with Ball - 1 x daily - 7 x weekly - 3 sets - 10 reps - 3s hold Seated march with opposite shoulder raise - 1 x daily - 7 x weekly - 3 sets - 10 reps Seated Chair Push Ups - 1 x daily - 7 x weekly - 3 sets - 10 reps - 3s hold Proper Sit to Stand Technique - 1 x daily - 7 x weekly - 3 sets - 10 reps   - Standing Weight Shift Side to Side  - 1 x daily - 7 x weekly - 2 sets - 5 reps - Side Stepping with Counter Support  - 1 x daily - 7 x weekly - 2 sets - 5 reps - Lateral Weight Shift with Arm Raise and Walker  - 1 x daily - 7 x weekly - 2 sets - 5 reps - Mini Squat with Counter Support  - 1 x daily - 7 x weekly - 2 sets - 5 reps    ASSESSMENT:   CLINICAL IMPRESSION: Emphasis of skilled PT session on education on body positioning, core stability and LE stretching. Pt reported R hip/groin pain in beginning of session that started last night, so beginning of session spent w/manual therapy. Noted poor seated positioning and body mechanics during transfers that likely are exacerbating R hip pain.  Educated pt on being mindful of body positioning and mechanics w/transfers to reduce strain on R hip, pt verbalized understanding. Continue POC.      OBJECTIVE IMPAIRMENTS Abnormal gait, decreased activity tolerance, decreased balance, decreased cognition, decreased coordination, decreased endurance, decreased knowledge of use of DME, decreased mobility, difficulty walking, decreased strength, decreased safety awareness, impaired perceived functional ability, impaired UE functional use, and postural dysfunction.    ACTIVITY LIMITATIONS cleaning, community activity, driving, meal prep, and laundry.    PERSONAL FACTORS Time since onset of injury/illness/exacerbation and 3+ comorbidities: MS, OA, anxiety/depression  are also affecting patient's functional outcome.        GOALS: Goals reviewed with patient? Yes  LONG TERM GOALS:    LTG Name Target Date Goal status  1 Pt/caregiver will be IND with final HEP in order to indicate improved functional mobility and dec fall risk.   Baseline: 05/21/2022 Ongoing       3 Pt will report performing stand pivot transfers at home with RW at mod I level consistently in order to indicate improved functional mobility.  Baseline: 05/21/2022 REVISED   4 Pt will be able to stand x 5 mins doing bilateral UE task (with intermittent support for rest if needed) to do tasks such as folding laundry, performing kitchen tasks, and grooming ADLs.  Baseline: 05/21/2022 REVISED   5 Pt will ambulate x 54' with RW at S while negotiating around obstacles and making turns in order to indicate improved functional mobility in her home.  Baseline: 05/21/2022 REVISED    PLAN: PT FREQUENCY: 2x/week   PT DURATION: 4 weeks   PLANNED INTERVENTIONS: Therapeutic exercises, Therapeutic activity, Neuromuscular re-education, Balance training, Gait training, Patient/Family education, DME instructions, and Aquatic Therapy   PLAN FOR NEXT SESSION: 20th visit PN. Take her in kitchen  and work on ADLs (maybe simple meal prep) laundry tasks, etc for LTGs, gait with making turns, going around obstacles, toe taps to step, glute/posterior chain strengthening exercises, SciFit w/BLEs only, standing tolerance, balance in // bars, gait training     Jill Alexanders Abeera Flannery, PT, DPT 05/08/22, 2:49 PM

## 2022-05-11 DIAGNOSIS — G35 Multiple sclerosis: Secondary | ICD-10-CM | POA: Diagnosis not present

## 2022-05-12 DIAGNOSIS — G35 Multiple sclerosis: Secondary | ICD-10-CM | POA: Diagnosis not present

## 2022-05-13 DIAGNOSIS — G35 Multiple sclerosis: Secondary | ICD-10-CM | POA: Diagnosis not present

## 2022-05-14 DIAGNOSIS — G35 Multiple sclerosis: Secondary | ICD-10-CM | POA: Diagnosis not present

## 2022-05-15 DIAGNOSIS — G35 Multiple sclerosis: Secondary | ICD-10-CM | POA: Diagnosis not present

## 2022-05-18 ENCOUNTER — Ambulatory Visit: Payer: Medicare HMO | Admitting: Physical Therapy

## 2022-05-18 DIAGNOSIS — G35 Multiple sclerosis: Secondary | ICD-10-CM | POA: Diagnosis not present

## 2022-05-19 DIAGNOSIS — G35 Multiple sclerosis: Secondary | ICD-10-CM | POA: Diagnosis not present

## 2022-05-20 ENCOUNTER — Ambulatory Visit: Payer: Medicare HMO | Admitting: Physical Therapy

## 2022-05-20 DIAGNOSIS — G35 Multiple sclerosis: Secondary | ICD-10-CM | POA: Diagnosis not present

## 2022-05-21 DIAGNOSIS — G35 Multiple sclerosis: Secondary | ICD-10-CM | POA: Diagnosis not present

## 2022-05-22 DIAGNOSIS — G35 Multiple sclerosis: Secondary | ICD-10-CM | POA: Diagnosis not present

## 2022-05-25 ENCOUNTER — Other Ambulatory Visit (HOSPITAL_BASED_OUTPATIENT_CLINIC_OR_DEPARTMENT_OTHER): Payer: Self-pay

## 2022-05-25 ENCOUNTER — Other Ambulatory Visit: Payer: Self-pay | Admitting: Internal Medicine

## 2022-05-25 ENCOUNTER — Telehealth: Payer: Self-pay | Admitting: Internal Medicine

## 2022-05-25 DIAGNOSIS — Z1231 Encounter for screening mammogram for malignant neoplasm of breast: Secondary | ICD-10-CM

## 2022-05-25 DIAGNOSIS — G35 Multiple sclerosis: Secondary | ICD-10-CM | POA: Diagnosis not present

## 2022-05-25 NOTE — Telephone Encounter (Signed)
Pt called Requesting a New Order be placed for the MM with the [facility].  Per a new order will need to be placed in order for her to be sch.  Please advise if an order could be placed to this site with [facility].

## 2022-05-26 ENCOUNTER — Encounter: Payer: Self-pay | Admitting: Diagnostic Neuroimaging

## 2022-05-26 ENCOUNTER — Ambulatory Visit (INDEPENDENT_AMBULATORY_CARE_PROVIDER_SITE_OTHER): Payer: Medicare HMO | Admitting: Diagnostic Neuroimaging

## 2022-05-26 VITALS — BP 134/84 | HR 72 | Ht 68.0 in | Wt 285.0 lb

## 2022-05-26 DIAGNOSIS — G35 Multiple sclerosis: Secondary | ICD-10-CM

## 2022-05-26 MED ORDER — TIZANIDINE HCL 4 MG PO TABS: 4.0000 mg | ORAL_TABLET | Freq: Three times a day (TID) | ORAL | 4 refills | Status: DC

## 2022-05-26 MED ORDER — BACLOFEN 20 MG PO TABS: 20.0000 mg | ORAL_TABLET | Freq: Three times a day (TID) | ORAL | 4 refills | Status: DC

## 2022-05-26 NOTE — Progress Notes (Signed)
[facility]  PATIENT: Ebony Warren DOB: 12/17/1975  REFERRING CLINICIAN: Williams  HISTORY FROM: patient  REASON FOR VISIT: follow up   HISTORICAL  CHIEF COMPLAINT:  Chief Complaint  Patient presents with   Follow-up    RM alone Pt is well and stable, no new concerns     HISTORY OF PRESENT ILLNESS:   UPDATE (05/26/22, VRP): Since last visit, doing well. Symptoms are stable. No alleviating or aggravating factors. Tolerating ocrevus. Planning to go on cruise vacation in April 2024.   UPDATE (09/23/21, VRP): Since last visit, slightly more balance and gait issues. Has power chair, but it was defective on arrival, now waiting for repair. Tolerating meds.   UPDATE (03/31/21, VRP): Since last visit, here for evaluation for electric wheelchair / power chair again. Symptoms are progressive. Severity is moderate. No alleviating or aggravating factors. Tolerating meds / ocrevus. Here for face-face evaluation and mobility assessment for power wheelchair. Has multiple sclerosis and hemiplegia. Cannot perform ADLs due to mobility issues. Cannot propel manual wheelchair due to hand weakness. Living at home with son and mother. Has personal care aid. Not driving currently.   UPDATE (12/10/20, VRP): Since last visit, more left leg weakness and gait difficulty. Requesting evaluation for electric wheelchair / power chair. Symptoms are progressive. Severity is moderate. No alleviating or aggravating factors. Tolerating meds / ocrevus. Here for face-face evaluation of power wheelchair. Cannot perform ADLs due to mobility issues. Cannot propel manual wheelchair due to hand weakness. Living at home with son and mother. Has personal care aid. Not driving currently.   UPDATE (05/21/20, VRP): Since last visit, doing fair. More depression, spasms. Had event last week of body trembling, no LOC, lasting 10 minutes. Symptoms are progressive. Tolerating ocrevus and muscle  relaxers.  UPDATE (11/20/19, VRP): Here for face-face evaluation of wheelchair need. Currently using a wheelchair due to MS and mobility impairment, now needs a replacement. Patient able to propel wheelchair. Overall, since last visit, doing fair. Continues on ocrevus; had mild reaction at IV site last time. No other alleviating or aggravating factors. Tolerating meds.    UPDATE (10/25/18, VRP): Since last visit, doing about the same. Symptoms are stable EXCEPT right hand weakness and coordination is worse. Some right shoulder twitching. Memory loss continues. No alleviating or aggravating factors. Tolerating meds.    UPDATE (04/18/18, VRP): Since last visit, doing well on ocrevus, except had another fall. Recently more urine incont, and right leg swelling. Tolerating meds. No alleviating or aggravating factors.   UPDATE (10/18/17, VRP): Since last visit, doing well. Tolerating ocrevus. No alleviating or aggravating factors. No new events. C/o cough and dry mouth issues.   UPDATE 05/19/17: Since last visit, now transitioned to ocrevus (May 21, June 4). Did well with infusion. Spasms stable. Left leg slightly better. Bladder slightly better. Depression stable. Using rollator walker. No other triggering factors. Overall doing well.   UPDATE 02/08/17: Since last visit, MRI brain shows 1 new lesion. Here to discuss ocrevus. Also with more left leg pain, muscle spasms, gait diff, fatigue. Right ear pain better. Bladder incont slightly better with oxybutinin initially, but not anymore.   UPDATE 12/22/16: Since last visit, was doing well until Nov 2017 and then developed stabbing right ear pain (continues to present). Also with new and increasing spasm and cramps in right hand and left leg since Dec 2017. Continues with fatigue and bladder incontinence.  UPDATE 05/13/16: Since last visit, doing well. No new MS events or symptoms. In retrospect,  may have lost vision in right eye (2015, and possibly March 2016),  lasting 3-7 days. Didn't mention to me until now. Recently this was noted optometry and ophthalmology exams.   UPDATE 01/27/16: Since last visit, fatigue improved with adjusting medication timing. Bladder issues stable. Had UTI at last visit, tx'd with abx, now better.  UPDATE 10/22/15: Since last visit was doing well. Then last week had new onset weakness in legs, bladder incontinence, strong smelling urine, and hand tremors. Now sxs almost fully resolved since yesterday.  UPDATE 05/01/15: Symptoms stable. Tolerating tecfidera.  UPDATE 01/25/15: Since last visit, had more problems for a few weeks (lower ext weakness, bladder incont), but now improved. Now on tecfidera x 3 weeks. Some itching and stomach issues with tecfidera, but mild. Main issues now consist of lower extremity pain, spasms, urinary leakage, daytime fatigue.  UPDATE 10/31/14: Since last visit, sxs are stable. Test results and diagnosis reviewed. No new events. Using cane to walk.  UPDATE (10/02/14): 46 year old right-handed female here for evaluation of double vision. 2008 patient was 6 months pregnant with her son, when all of a sudden she didn't feel good. She noticed right hand clumsiness and incoordination. This lasted for approximately 9 months and then stopped. She did not seek medical attention for this problem. 2013 patient had onset of right arm weakness, right leg weakness, numbness on the right side, intermittent shaking sensation all over. Patient was evaluated in [place] and diagnosed with possible stroke. 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 Patient did not recover right arm or leg strength. Her speech was also affected. October 2014 patient moved to [place]. 08/30/2014 patient was at home hanging clothes over the bathtub when she fell down. She is not sure what caused her fall. Her right leg may have given out. Patient fell and struck her head against the soap dish which broke off. Patient did not lose  consciousness. Patient's family advised her to go to the emergency room. Patient was evaluated with CT and MRI of the brain. No acute findings were found. MRI of the brain did show multiple white matter lesions suspicious for chronic multiple sclerosis versus chronic small vessel ischemic disease. Advised to follow-up in outpatient basis. Since this fall patient has noticed some additional blurred vision, and double vision especially when looking to the left side.   REVIEW OF SYSTEMS: Full 14 system review of systems performed and negative except for: as per HPI.    ALLERGIES: Allergies  Allergen Reactions   Hydrocodone Itching    Stomach cramps   Morphine Itching   Orange Fruit [Citrus]     Acidic foods Makes her itchy and her face "feels like its on fire", have rash.   Oxycodone Itching    rash   Other Rash    Narcotics. Spicy foods (cause seizures)    HOME MEDICATIONS: Outpatient Medications Prior to Visit  Medication Sig Dispense Refill   ARIPiprazole (ABILIFY) 5 MG tablet Take 5 mg by mouth daily.     cholecalciferol (VITAMIN D) 1000 UNITS tablet Take 1,000 Units by mouth daily.     diphenhydrAMINE (BENADRYL) 25 mg capsule Take 25 mg by mouth every 6 (six) hours as needed for allergies.     DULoxetine (CYMBALTA) 30 MG capsule Take 1 capsule (30 mg total) by mouth daily. 30 capsule 2   gabapentin (NEURONTIN) 300 MG capsule Take 2 capsules (600 mg total) by mouth 3 (three) times daily. 540 capsule 4   LORazepam (ATIVAN) 1 MG tablet Take 1  mg by mouth daily.     Multiple Vitamins-Minerals (MULTIVITAMIN ADULT PO) Take 1 tablet by mouth daily.     ocrelizumab (OCREVUS) 300 MG/10ML injection Infuse 600 mg IV Every 6 months 20 mL 1   pantoprazole (PROTONIX) 40 MG tablet TAKE 1 TABLET(40 MG) BY MOUTH DAILY 30 tablet 3   baclofen (LIORESAL) 20 MG tablet Take 1 tablet (20 mg total) by mouth 3 (three) times daily. 270 tablet 3   tiZANidine (ZANAFLEX) 4 MG tablet TAKE 1 TABLET(4 MG) BY  MOUTH THREE TIMES DAILY 90 tablet 12   No facility-administered medications prior to visit.    PAST MEDICAL HISTORY: Past Medical History:  Diagnosis Date   Anxiety and depression    Arthritis    Bradycardia    Encounter to establish care 01/22/2021   Family history of adverse reaction to anesthesia    nausea   GERD (gastroesophageal reflux disease)    Headache    Multiple sclerosis (HCC)    Screening mammogram, encounter for 01/22/2021   Seizures (HCC)    told that they are not from the brain but stress related    PAST SURGICAL HISTORY: Past Surgical History:  Procedure Laterality Date   BREAST REDUCTION SURGERY Bilateral 06/23/2021   Procedure: MAMMARY REDUCTION  (BREAST);  Surgeon: Allena Napoleon, MD;  Location: [facility] OR;  Service: Plastics;  Laterality: Bilateral;   CHOLECYSTECTOMY     KNEE SURGERY Bilateral    Arthroscopy    FAMILY HISTORY: Family History  Problem Relation Age of Onset   Hypertension Mother    Atrial fibrillation Mother    Hypertension Father    Atrial fibrillation Maternal Grandmother     SOCIAL HISTORY:  Social History   Socioeconomic History   Marital status: Single    Spouse name: Not on file   Number of children: 1   Years of education: College   Highest education level: Not on file  Occupational History    Employer: OTHER    Comment: disabled  Tobacco Use   Smoking status: Former    Packs/day: 0.35    Years: 3.00    Total pack years: 1.05    Types: Cigarettes    Quit date: 12/07/2006    Years since quitting: 15.4   Smokeless tobacco: Never  Vaping Use   Vaping Use: Never used  Substance and Sexual Activity   Alcohol use: No    Alcohol/week: 0.0 standard drinks of alcohol    Comment: quit: 2013 (socially)   Drug use: Yes    Types: Marijuana    Comment: Smokes approx 1-2 per day   Sexual activity: Not on file  Other Topics Concern   Not on file  Social History Narrative   Patient lives at home with family.   Caffeine  Use: drinks a cup of hot tea with tecfidera in the am   Patient is right handed.   Patient has a college education   Social Determinants of Corporate investment banker Strain: Not on file  Food Insecurity: Not on file  Transportation Needs: Not on file  Physical Activity: Not on file  Stress: Not on file  Social Connections: Not on file  Intimate Partner Violence: Not on file     PHYSICAL EXAM  Vitals:   05/26/22 1049  BP: 134/84  Pulse: 72  Weight: 285 lb (129.3 kg)  Height: 5\' 8"  (1.727 m)   No results found.   Body mass index is 43.33 kg/m.  GENERAL EXAM: Patient  is in no distress; well developed, nourished and groomed; neck is supple  CARDIOVASCULAR: Regular rate and rhythm, no murmurs, no carotid bruits  NEUROLOGIC: MENTAL STATUS: awake, alert, language fluent, comprehension intact, naming intact, fund of knowledge appropriate; SLOW SCANNING SPEECH PATTERN CRANIAL NERVE: PUPILS PINPOINT AND REACTIVE; visual fields full to confrontation, extraocular muscles --> SACCADIC DYSMETRIA; NO PTOSIS; facial sensation symmetric, FACE --> DEC LEFT NL FOLD, hearing intact, palate elevates symmetrically, uvula midline, shoulder shrug symmetric, tongue midline. MOTOR: INCREASED TONE IN RUE and RLE; BUE 3 PROX AND 2-3 DISTAL; BLE 2-3 (right weaker than left) SENSORY: DECR IN RIGHT HAND AND RIGHT FOOT TO ALL MODALITIES COORDINATION: BUE DYSMETRIA; SLOW FINGER AND FOOT TAP ON RIGHT REFLEXES: RUE 2+, LUE 1; BLE TRACE GAIT/STATION: IN POWER WHEELCHAIR    DIAGNOSTIC DATA (LABS, IMAGING, TESTING) - I reviewed patient records, labs, notes, testing and imaging myself where available.  Lab Results  Component Value Date   WBC 3.8 09/23/2021   HGB 14.2 09/23/2021   HCT 43.0 09/23/2021   MCV 94 09/23/2021   PLT 273 09/23/2021      Component Value Date/Time   NA 141 09/23/2021 1113   K 4.0 09/23/2021 1113   CL 105 09/23/2021 1113   CO2 23 09/23/2021 1113   GLUCOSE 60 (L)  09/23/2021 1113   GLUCOSE 85 06/23/2021 1136   BUN 15 09/23/2021 1113   CREATININE 0.79 09/23/2021 1113   CALCIUM 9.1 09/23/2021 1113   PROT 6.5 09/23/2021 1113   ALBUMIN 4.0 09/23/2021 1113   AST 13 09/23/2021 1113   ALT 9 09/23/2021 1113   ALKPHOS 85 09/23/2021 1113   BILITOT 0.2 09/23/2021 1113   GFRNONAA 76 12/10/2020 1251   GFRAA 87 12/10/2020 1251   No results found for: "CHOL", "HDL", "LDLCALC", "LDLDIRECT", "TRIG", "CHOLHDL" No results found for: "HGBA1C" No results found for: "VITAMINB12" No results found for: "TSH"  Vit D, 25-Hydroxy  Date Value Ref Range Status  09/23/2021 38.1 30.0 - 100.0 ng/mL Final    Comment:    Vitamin D deficiency has been defined by the Institute of Medicine and an Endocrine Society practice guideline as a level of serum 25-OH vitamin D less than 20 ng/mL (1,2). The Endocrine Society went on to further define vitamin D insufficiency as a level between 21 and 29 ng/mL (2). 1. IOM (Institute of Medicine). 2010. Dietary reference    intakes for calcium and D. [place]: The    [employer]. 2. Holick MF, Binkley [place], Bischoff-Ferrari HA, et al.    Evaluation, treatment, and prevention of vitamin D    deficiency: an Endocrine Society clinical practice    guideline. JCEM. 2011 Jul; 96(7):1911-30.   12/22/2016 34.1 30.0 - 100.0 ng/mL Final    Comment:    Vitamin D deficiency has been defined by the Institute of Medicine and an Endocrine Society practice guideline as a level of serum 25-OH vitamin D less than 20 ng/mL (1,2). The Endocrine Society went on to further define vitamin D insufficiency as a level between 21 and 29 ng/mL (2). 1. IOM (Institute of Medicine). 2010. Dietary reference    intakes for calcium and D. [place]: The    [employer]. 2. Holick MF, Binkley [place], Bischoff-Ferrari HA, et al.    Evaluation, treatment, and prevention of vitamin D    deficiency: an Endocrine Society clinical  practice    guideline. JCEM. 2011 Jul; 96(7):1911-30.   10/22/2015 35.7 30.0 - 100.0 ng/mL Final  Comment:    Vitamin D deficiency has been defined by the Institute of Medicine and an Endocrine Society practice guideline as a level of serum 25-OH vitamin D less than 20 ng/mL (1,2). The Endocrine Society went on to further define vitamin D insufficiency as a level between 21 and 29 ng/mL (2). 1. IOM (Institute of Medicine). 2010. Dietary reference    intakes for calcium and D. [place]: The    [employer]. 2. Holick MF, Binkley [place], Bischoff-Ferrari HA, et al.    Evaluation, treatment, and prevention of vitamin D    deficiency: an Endocrine Society clinical practice    guideline. JCEM. 2011 Jul; 96(7):1911-30.    Lymphocytes Absolute  Date Value Ref Range Status  09/23/2021 0.9 0.7 - 3.1 x10E3/uL Final  12/10/2020 1.5 0.7 - 3.1 x10E3/uL Final  11/20/2019 1.4 0.7 - 3.1 x10E3/uL Final    11/19/18 MRI of the brain with and without contrast shows the following: 1.    T2/flair hyperintense foci in the brainstem, cerebellum, left thalamus and hemispheres in a pattern and configuration consistent with chronic demyelinating plaque associated with multiple sclerosis.  None of the foci appear to be acute and there were no new lesions compared to the 2018 MRI. 2.    There is a normal enhancement pattern and there are no acute findings.  11/19/18 MRI of the cervical spine with and without contrast shows the following: 1.     There are about 8 T2 hyperintense foci within the spinal cord consistent with chronic demyelinating plaque associated with multiple sclerosis.  None of the foci appears to be acute.  They were all present on the MRI dated 11/05/2015. 2.     There are no significant cervical spine degenerative changes. 3.     There is a normal enhancement pattern and no acute findings.  05/23/20 MRI brain MRI brain without contrast demonstrating: -Mild stable  periventricular, pericallosal and subcortical foci of chronic demyelinating plaques. -No acute finding.  10/08/21 MRI brain (with and without) demonstrating: - Few small, stable chronic demyelinating plaques.  - No acute plaques.    Labs - ANA, ANCA, ACE, HIV, RPR - all negative  11/01/14 anti-JCV ab - 1.31 (H) positive  07/27/18 RLE u/s - No evidence of DVT and no evidence of venous insufficiency.    ASSESSMENT AND PLAN  46 y.o. year old female here with multiple focal neurologic attacks since 2008. Neurologic exam demonstrates long tract signs affecting the right arm and right leg more than left side. 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 Other labs negative.   On tecfidera since Feb 2016. Had flare up symptoms in Nov 2016 and Nov-Dec 2017. Also new plaque in Jan 2018. Now on ocrevus since May/June 2018.   Muscle spasms and leg pain (on baclofen, tizanidine and gabapentin).  Fatigue improved with medication regimen adjustment.   Bladder incontinence --> using adult pads and oxybutinin   Depression stable (seeing Dr. Lilian Kapur, [facility]).    Dx:  Multiple sclerosis (HCC) - Plan: CBC with Differential/Platelet, Comprehensive metabolic panel, Immunoglobulins, QN, A/E/G/M     PLAN:  Multiple sclerosis disease modifying therapy (established problem, progressive) - continue ocrevus - check CBC, CMP, immunoglobulin panel every 6 months - check MRI brain - continue multivitamin and vitamin D supplements - [facility] transport form filled out  Muscle spasms (established problem, stable) - continue baclofen 20mg  three times per day for muscle spasms - continue tizanidine 4mg  three times per  day for muscle spasms  MEMORY LOSS - optimize nutrition and exercise - follow up with psychiatry (optimize depression / anxiety treatments; consider caffeine vs stimulant meds) - safety / supervision issues reviewed - no driving, caution with  meds, finances  Bilateral leg pain (established problem, stable) - continue gabapentin up to 600mg  three times per day  Bladder incontinence (established problem, improved) - consider follow up with urology (tried and failed oxybutynin)  Depression / anxiety (established problem, stable) - per Dr. [facility]) - continue abilify, duloxetine  Orders Placed This Encounter  Procedures   CBC with Differential/Platelet   Comprehensive metabolic panel   Immunoglobulins, QN, A/E/G/M   Meds ordered this encounter  Medications   baclofen (LIORESAL) 20 MG tablet    Sig: Take 1 tablet (20 mg total) by mouth 3 (three) times daily.    Dispense:  270 tablet    Refill:  4   tiZANidine (ZANAFLEX) 4 MG tablet    Sig: Take 1 tablet (4 mg total) by mouth 3 (three) times daily.    Dispense:  270 tablet    Refill:  4    Return in about 8 months (around 01/26/2023).    01/28/2023, MD 05/26/2022, 11:14 AM Certified in Neurology, Neurophysiology and Neuroimaging  [facility] [address], [address] [place], [place] [phone]

## 2022-05-27 DIAGNOSIS — G35 Multiple sclerosis: Secondary | ICD-10-CM | POA: Diagnosis not present

## 2022-05-28 DIAGNOSIS — G35 Multiple sclerosis: Secondary | ICD-10-CM | POA: Diagnosis not present

## 2022-05-29 DIAGNOSIS — G35 Multiple sclerosis: Secondary | ICD-10-CM | POA: Diagnosis not present

## 2022-05-29 LAB — COMPREHENSIVE METABOLIC PANEL
ALT: 10 IU/L (ref 0–32)
AST: 14 IU/L (ref 0–40)
Albumin/Globulin Ratio: 1.5 (ref 1.2–2.2)
Albumin: 4 g/dL (ref 3.8–4.8)
Alkaline Phosphatase: 95 IU/L (ref 44–121)
BUN/Creatinine Ratio: 16 (ref 9–23)
BUN: 12 mg/dL (ref 6–24)
Bilirubin Total: 0.5 mg/dL (ref 0.0–1.2)
CO2: 23 mmol/L (ref 20–29)
Calcium: 9 mg/dL (ref 8.7–10.2)
Chloride: 102 mmol/L (ref 96–106)
Creatinine, Ser: 0.75 mg/dL (ref 0.57–1.00)
Globulin, Total: 2.7 g/dL (ref 1.5–4.5)
Glucose: 84 mg/dL (ref 70–99)
Potassium: 4.3 mmol/L (ref 3.5–5.2)
Sodium: 138 mmol/L (ref 134–144)
Total Protein: 6.7 g/dL (ref 6.0–8.5)
eGFR: 101 mL/min/{1.73_m2} (ref >59)

## 2022-05-29 LAB — CBC WITH DIFFERENTIAL/PLATELET
Basophils Absolute: 0 10*3/uL (ref 0.0–0.2)
Basos: 0 %
EOS (ABSOLUTE): 0.1 10*3/uL (ref 0.0–0.4)
Eos: 1 %
Hematocrit: 42.2 % (ref 34.0–46.6)
Hemoglobin: 14 g/dL (ref 11.1–15.9)
Immature Grans (Abs): 0 10*3/uL (ref 0.0–0.1)
Immature Granulocytes: 0 %
Lymphocytes Absolute: 1.6 10*3/uL (ref 0.7–3.1)
Lymphs: 32 %
MCH: 31 pg (ref 26.6–33.0)
MCHC: 33.2 g/dL (ref 31.5–35.7)
MCV: 94 fL (ref 79–97)
Monocytes Absolute: 0.3 10*3/uL (ref 0.1–0.9)
Monocytes: 6 %
Neutrophils Absolute: 3.1 10*3/uL (ref 1.4–7.0)
Neutrophils: 61 %
Platelets: 311 10*3/uL (ref 150–450)
RBC: 4.51 x10E6/uL (ref 3.77–5.28)
RDW: 12.6 % (ref 11.7–15.4)
WBC: 5.1 10*3/uL (ref 3.4–10.8)

## 2022-05-29 LAB — IMMUNOGLOBULINS A/E/G/M, SERUM
IgA/Immunoglobulin A, Serum: 334 mg/dL (ref 87–352)
IgE (Immunoglobulin E), Serum: 10 IU/mL (ref 6–495)
IgG (Immunoglobin G), Serum: 1215 mg/dL (ref 586–1602)
IgM (Immunoglobulin M), Srm: 63 mg/dL (ref 26–217)

## 2022-06-01 DIAGNOSIS — G35 Multiple sclerosis: Secondary | ICD-10-CM | POA: Diagnosis not present

## 2022-06-02 ENCOUNTER — Encounter: Payer: Self-pay | Admitting: *Deleted

## 2022-06-02 DIAGNOSIS — G35 Multiple sclerosis: Secondary | ICD-10-CM | POA: Diagnosis not present

## 2022-06-03 DIAGNOSIS — G35 Multiple sclerosis: Secondary | ICD-10-CM | POA: Diagnosis not present

## 2022-06-04 DIAGNOSIS — G35 Multiple sclerosis: Secondary | ICD-10-CM | POA: Diagnosis not present

## 2022-06-05 DIAGNOSIS — G35 Multiple sclerosis: Secondary | ICD-10-CM | POA: Diagnosis not present

## 2022-06-06 ENCOUNTER — Ambulatory Visit (HOSPITAL_BASED_OUTPATIENT_CLINIC_OR_DEPARTMENT_OTHER): Payer: Medicare HMO | Admitting: Radiology

## 2022-06-07 DIAGNOSIS — G35 Multiple sclerosis: Secondary | ICD-10-CM | POA: Diagnosis not present

## 2022-06-08 DIAGNOSIS — G35 Multiple sclerosis: Secondary | ICD-10-CM | POA: Diagnosis not present

## 2022-06-10 ENCOUNTER — Non-Acute Institutional Stay (HOSPITAL_COMMUNITY)
Admission: RE | Admit: 2022-06-10 | Discharge: 2022-06-10 | Disposition: A | Discharge status: Home / Self Care | Payer: Medicare HMO | Source: Ambulatory Visit | Attending: Internal Medicine | Admitting: Internal Medicine

## 2022-06-10 DIAGNOSIS — G35 Multiple sclerosis: Secondary | ICD-10-CM | POA: Insufficient documentation

## 2022-06-10 MED ORDER — METHYLPREDNISOLONE SODIUM SUCC 125 MG IJ SOLR
125.0000 mg | Freq: Once | INTRAMUSCULAR | Status: AC
  Administered 2022-06-10: 125 mg via INTRAVENOUS
  Filled 2022-06-10: qty 2

## 2022-06-10 MED ORDER — SODIUM CHLORIDE 0.9 % IV SOLN: INTRAVENOUS | Status: DC | PRN

## 2022-06-10 MED ORDER — DIPHENHYDRAMINE HCL 50 MG/ML IJ SOLN
50.0000 mg | Freq: Once | INTRAMUSCULAR | Status: AC
  Administered 2022-06-10: 50 mg via INTRAVENOUS
  Filled 2022-06-10: qty 1

## 2022-06-10 MED ORDER — FAMOTIDINE IN NACL 20-0.9 MG/50ML-% IV SOLN
20.0000 mg | Freq: Once | INTRAVENOUS | Status: AC
  Administered 2022-06-10: 20 mg via INTRAVENOUS
  Filled 2022-06-10: qty 50

## 2022-06-10 MED ORDER — SODIUM CHLORIDE 0.9 % IV SOLN
600.0000 mg | Freq: Once | INTRAVENOUS | Status: AC
  Administered 2022-06-10: 600 mg via INTRAVENOUS
  Filled 2022-06-10: qty 20

## 2022-06-10 MED ORDER — ACETAMINOPHEN 325 MG PO TABS
650.0000 mg | ORAL_TABLET | Freq: Once | ORAL | Status: AC
  Administered 2022-06-10: 650 mg via ORAL
  Filled 2022-06-10: qty 2

## 2022-06-10 NOTE — Progress Notes (Signed)
PATIENT CARE CENTER NOTE     Diagnosis: Multiple Sclerosis G35     Provider: Joycelyn Schmid, MD     Procedure: Ocrevus infusion      Note: Patient received Ocrevus 600 mg infusion via PIV. Pre medications given per order. Infusion titrated per protocol. Patient tolerated well with no adverse reaction. Patient declined 1 hour observation post infusion.  Vital signs stable. AVS offered but patient refused. Patient to come back in 6 month for next infusion and will schedule appointment at the front desk. Patient alert, oriented and transferred in wheelchair at discharge.

## 2022-06-11 DIAGNOSIS — G35 Multiple sclerosis: Secondary | ICD-10-CM | POA: Diagnosis not present

## 2022-06-12 ENCOUNTER — Ambulatory Visit (INDEPENDENT_AMBULATORY_CARE_PROVIDER_SITE_OTHER): Payer: Medicare HMO

## 2022-06-12 DIAGNOSIS — Z0000 Encounter for general adult medical examination without abnormal findings: Secondary | ICD-10-CM

## 2022-06-12 DIAGNOSIS — G35 Multiple sclerosis: Secondary | ICD-10-CM | POA: Diagnosis not present

## 2022-06-12 NOTE — Progress Notes (Signed)
Subjective:   Ebony Warren is a 46 y.o. female who presents for an Initial Medicare Annual Wellness Visit. I connected with  Ebony Warren on 06/12/22 by a audio enabled telemedicine application and verified that I am speaking with the correct person using two identifiers.  Patient Location: Home  Provider Location: Office/Clinic  I discussed the limitations of evaluation and management by telemedicine. The patient expressed understanding and agreed to proceed.  Review of Systems    Defer to PCP.        Objective:    There were no vitals filed for this visit. There is no height or weight on file to calculate BMI.     06/12/2022   12:20 PM 02/04/2022   11:16 AM 07/03/2021    4:17 PM 06/24/2021   12:00 AM 06/23/2021   11:52 PM 06/23/2021   11:00 PM 06/23/2021   11:13 AM  Advanced Directives  Does Patient Have a Medical Advance Directive? Yes Yes Yes    Yes  Type of Paramedic of [place];Living will Healthcare Power of [place] of [place] of [place] of [place] of Attorney  Does patient want to make changes to medical advance directive? No - Patient declined        Copy of [place] in Chart? No - copy requested No - copy requested  No - copy requested No - copy requested  No - copy requested    Current Medications (verified) Outpatient Encounter Medications as of 06/12/2022  Medication Sig   ARIPiprazole (ABILIFY) 5 MG tablet Take 5 mg by mouth daily.   baclofen (LIORESAL) 20 MG tablet Take 1 tablet (20 mg total) by mouth 3 (three) times daily.   cholecalciferol (VITAMIN D) 1000 UNITS tablet Take 1,000 Units by mouth daily.   diphenhydrAMINE (BENADRYL) 25 mg capsule Take 25 mg by mouth every 6 (six) hours as needed for allergies.   gabapentin (NEURONTIN) 300 MG capsule Take 2 capsules (600 mg total) by mouth 3  (three) times daily.   LORazepam (ATIVAN) 1 MG tablet Take 1 mg by mouth daily.   Multiple Vitamins-Minerals (MULTIVITAMIN ADULT PO) Take 1 tablet by mouth daily.   ocrelizumab (OCREVUS) 300 MG/10ML injection Infuse 600 mg IV Every 6 months   pantoprazole (PROTONIX) 40 MG tablet TAKE 1 TABLET(40 MG) BY MOUTH DAILY   tiZANidine (ZANAFLEX) 4 MG tablet Take 1 tablet (4 mg total) by mouth 3 (three) times daily.   DULoxetine (CYMBALTA) 30 MG capsule Take 1 capsule (30 mg total) by mouth daily.   No facility-administered encounter medications on file as of 06/12/2022.    Allergies (verified) Hydrocodone, Morphine, Orange fruit [citrus], Oxycodone, and Other   History: Past Medical History:  Diagnosis Date   Anxiety and depression    Arthritis    Bradycardia    Encounter to establish care 01/22/2021   Family history of adverse reaction to anesthesia    nausea   GERD (gastroesophageal reflux disease)    Headache    Multiple sclerosis ([place])    Screening mammogram, encounter for 01/22/2021   Seizures ([place])    told that they are not from the brain but stress related   Past Surgical History:  Procedure Laterality Date   BREAST REDUCTION SURGERY Bilateral 06/23/2021   Procedure: MAMMARY REDUCTION  (BREAST);  Surgeon: Cindra Presume, MD;  Location: [facility];  Service: Plastics;  Laterality: Bilateral;  CHOLECYSTECTOMY     KNEE SURGERY Bilateral    Arthroscopy   Family History  Problem Relation Age of Onset   Hypertension Mother    Atrial fibrillation Mother    Hypertension Father    Atrial fibrillation Maternal Grandmother    Social History   Socioeconomic History   Marital status: Single    Spouse name: Not on file   Number of children: 1   Years of education: College   Highest education level: Not on file  Occupational History    Employer: OTHER    Comment: disabled  Tobacco Use   Smoking status: Former    Packs/day: 0.35    Years: 3.00    Total pack years: 1.05    Types:  Cigarettes    Quit date: 12/07/2006    Years since quitting: 15.5   Smokeless tobacco: Never  Vaping Use   Vaping Use: Never used  Substance and Sexual Activity   Alcohol use: No    Alcohol/week: 0.0 standard drinks of alcohol    Comment: quit: 2013 (socially)   Drug use: Yes    Types: Marijuana    Comment: Smokes approx 1-2 per day   Sexual activity: Not Currently  Other Topics Concern   Not on file  Social History Narrative   Patient lives at home with family.   Caffeine Use: drinks a cup of hot tea with tecfidera in the am   Patient is right handed.   Patient has a college education   Social Determinants of Corporate investment banker Strain: Low Risk  (06/12/2022)   Overall Financial Resource Strain (CARDIA)    Difficulty of Paying Living Expenses: Not hard at all  Food Insecurity: No Food Insecurity (06/12/2022)   Hunger Vital Sign    Worried About Running Out of Food in the Last Year: Never true    Ran Out of Food in the Last Year: Never true  Transportation Needs: No Transportation Needs (06/12/2022)   PRAPARE - Administrator, Civil Service (Medical): No    Lack of Transportation (Non-Medical): No  Physical Activity: Inactive (06/12/2022)   Exercise Vital Sign    Days of Exercise per Week: 0 days    Minutes of Exercise per Session: 0 min  Stress: Unknown (06/12/2022)   [employer] of Occupational Health - Occupational Stress Questionnaire    Feeling of Stress : Patient refused  Recent Concern: Stress - Stress Concern Present (06/12/2022)   [place] [employer] of Occupational Health - Occupational Stress Questionnaire    Feeling of Stress : Very much  Social Connections: Moderately Integrated (06/12/2022)   Social Connection and Isolation Panel [NHANES]    Frequency of Communication with Friends and Family: More than three times a week    Frequency of Social Gatherings with Friends and Family: More than three times a week    Attends Religious Services: More than  4 times per year    Active Member of [employer] or Organizations: Yes    Attends Engineer, structural: More than 4 times per year    Marital Status: Divorced    Tobacco Counseling Counseling given: Not Answered   Clinical Intake:  Pre-visit preparation completed: Yes  Pain : No/denies pain     Nutritional Risks: None Diabetes: No  How often do you need to have someone help you when you read instructions, pamphlets, or other written materials from your doctor or pharmacy?: 1 - Never What is the last grade level you completed  in school?: some college  Diabetic?NO  Interpreter Needed?: No  Information entered by :: Hadassa Cermak, CMA 06/12/2022   Activities of Daily Living    06/12/2022   12:21 PM 01/08/2022   10:44 AM  In your present state of health, do you have any difficulty performing the following activities:  Hearing? 1 0  Vision? 1 1  Comment  blurry in right eye  Difficulty concentrating or making decisions? 1 1  Walking or climbing stairs? 1 1  Dressing or bathing? 1 1  Doing errands, shopping? 1 1  Preparing Food and eating ? Y   Using the Toilet? Y   In the past six months, have you accidently leaked urine? Y   Do you have problems with loss of bowel control? Y   Managing your Medications? N   Managing your Finances? N   Housekeeping or managing your Housekeeping? Y     Patient Care Team: Mercie Eon, MD as PCP - General (Internal Medicine) Jearld Lesch, MD as Referring Physician (Specialist)  Indicate any recent Medical Services you may have received from other than [facility] providers in the past year (date may be approximate).     Assessment:   This is a routine wellness examination for [place].  Hearing/Vision screen No results found.  Dietary issues and exercise activities discussed: Current Exercise Habits: The patient does not participate in regular exercise at present   Goals Addressed   None   Depression Screen    06/12/2022    12:21 PM 06/12/2022   12:14 PM 01/08/2022   11:34 AM 07/03/2021    4:15 PM 05/01/2021    9:51 AM 03/20/2021   11:12 AM 02/20/2021    9:46 AM  PHQ 2/9 Scores  PHQ - 2 Score 0 0 0 0 0 0 0  PHQ- 9 Score   9 0 9 12 12     Fall Risk    06/12/2022   12:20 PM 01/08/2022   10:43 AM 07/03/2021    4:14 PM 05/01/2021    9:47 AM 03/20/2021   11:12 AM  Fall Risk   Falls in the past year? 0 1 1 1  0  Number falls in past yr: 0 1 1 1    Injury with Fall? 0 1 0 0   Risk for fall due to : No Fall Risks History of fall(s);Impaired balance/gait Mental status change History of fall(s);Impaired balance/gait;Impaired mobility   Follow up Falls evaluation completed Falls evaluation completed;Falls prevention discussed  Falls prevention discussed     FALL RISK PREVENTION PERTAINING TO THE HOME:  Any stairs in or around the home? No  If so, are there any without handrails? No  Home free of loose throw rugs in walkways, pet beds, electrical cords, etc? Yes  Adequate lighting in your home to reduce risk of falls? Yes   ASSISTIVE DEVICES UTILIZED TO PREVENT FALLS:  Life alert? No  Use of a cane, walker or w/c? Yes  Grab bars in the bathroom? Yes  Shower chair or bench in shower? Yes  Elevated toilet seat or a handicapped toilet? Yes   TIMED UP AND GO:  Was the test performed? No .  Length of time to ambulate 10 feet: N/A sec.     Cognitive Function:    10/25/2018   11:35 AM  MMSE - Mini Mental State Exam  Orientation to time 5  Orientation to Place 5  Registration 3  Attention/ Calculation 4  Recall 1  Language- name 2 objects  2  Language- repeat 1  Language- follow 3 step command 3  Language- read & follow direction 1  Write a sentence 1  Copy design 1  Total score 27        06/12/2022   12:23 PM  6CIT Screen  What Year? 0 points  What month? 0 points  What time? 0 points  Count back from 20 0 points  Months in reverse 0 points  Repeat phrase 0 points  Total Score 0 points     Immunizations Immunization History  Administered Date(s) Administered   Tdap 05/01/2021    TDAP status: Up to date  Flu Vaccine status: Up to date  Pneumococcal vaccine status: Defer to PCP.   Covid-19 vaccine status: Completed vaccines  Qualifies for Shingles Vaccine? No   Zostavax completed No   Shingrix Completed?: No.    Education has been provided regarding the importance of this vaccine. Patient has been advised to call insurance company to determine out of pocket expense if they have not yet received this vaccine. Advised may also receive vaccine at local pharmacy or Health Dept. Verbalized acceptance and understanding.  Screening Tests Health Maintenance  Topic Date Due   COVID-19 Vaccine (1) Never done   Hepatitis C Screening  Never done   INFLUENZA VACCINE  07/07/2022   PAP SMEAR-Modifier  08/12/2024   TETANUS/TDAP  05/02/2031   HIV Screening  Completed   HPV VACCINES  Aged Out    Health Maintenance  Health Maintenance Due  Topic Date Due   COVID-19 Vaccine (1) Never done   Hepatitis C Screening  Never done    Colon cancer screening defer to PCP.   Mammogram status: Completed 04/24/2021. Repeat every year  Bone Density status: Ordered defer to PCP. Pt provided with contact info and advised to call to schedule appt.  Lung Cancer Screening: (Low Dose CT Chest recommended if Age 39-80 years, 30 pack-year currently smoking OR have quit w/in 15years.) does not qualify.   Lung Cancer Screening Referral: Defer to PCP.  Additional Screening:  Hepatitis C Screening: does qualify; Completed never done.   Vision Screening: Recommended annual ophthalmology exams for early detection of glaucoma and other disorders of the eye. Is the patient up to date with their annual eye exam?  Yes  Who is the provider or what is the name of the office in which the patient attends annual eye exams? [facility] If pt is not established with a provider, would they like to  be referred to a provider to establish care? No .   Dental Screening: Recommended annual dental exams for proper oral hygiene  Community Resource Referral / Chronic Care Management: CRR required this visit?  No   CCM required this visit?  No      Plan:     I have personally reviewed and noted the following in the patient's chart:   Medical and social history Use of alcohol, tobacco or illicit drugs  Current medications and supplements including opioid prescriptions. Patient is not currently taking opioid prescriptions. Functional ability and status Nutritional status Physical activity Advanced directives List of other physicians Hospitalizations, surgeries, and ER visits in previous 12 months Vitals Screenings to include cognitive, depression, and falls Referrals and appointments  In addition, I have reviewed and discussed with patient certain preventive protocols, quality metrics, and best practice recommendations. A written personalized care plan for preventive services as well as general preventive health recommendations were provided to patient.  Cornellius Kropp, CMA   06/12/2022   Nurse Notes: Non-face to face, 28 minutes.   Ms. Marcellus , Thank you for taking time to come for your Medicare Wellness Visit. I appreciate your ongoing commitment to your health goals. Please review the following plan we discussed and let me know if I can assist you in the future.   These are the goals we discussed:  Goals   None     This is a list of the screening recommended for you and due dates:  Health Maintenance  Topic Date Due   COVID-19 Vaccine (1) Never done   Hepatitis C Screening: USPSTF Recommendation to screen - Ages 65-79 yo.  Never done   Flu Shot  07/07/2022   Pap Smear  08/12/2024   Tetanus Vaccine  05/02/2031   HIV Screening  Completed   HPV Vaccine  Aged Out

## 2022-06-15 DIAGNOSIS — G35 Multiple sclerosis: Secondary | ICD-10-CM | POA: Diagnosis not present

## 2022-06-16 DIAGNOSIS — G35 Multiple sclerosis: Secondary | ICD-10-CM | POA: Diagnosis not present

## 2022-06-17 DIAGNOSIS — G35 Multiple sclerosis: Secondary | ICD-10-CM | POA: Diagnosis not present

## 2022-06-18 DIAGNOSIS — G35 Multiple sclerosis: Secondary | ICD-10-CM | POA: Diagnosis not present

## 2022-06-19 ENCOUNTER — Ambulatory Visit (HOSPITAL_BASED_OUTPATIENT_CLINIC_OR_DEPARTMENT_OTHER)
Admission: RE | Admit: 2022-06-19 | Discharge: 2022-06-19 | Disposition: A | Discharge status: Home / Self Care | Payer: Medicare HMO | Source: Ambulatory Visit | Attending: Internal Medicine | Admitting: Internal Medicine

## 2022-06-19 DIAGNOSIS — G35 Multiple sclerosis: Secondary | ICD-10-CM | POA: Diagnosis not present

## 2022-06-19 DIAGNOSIS — Z1231 Encounter for screening mammogram for malignant neoplasm of breast: Secondary | ICD-10-CM | POA: Diagnosis not present

## 2022-06-22 DIAGNOSIS — G35 Multiple sclerosis: Secondary | ICD-10-CM | POA: Diagnosis not present

## 2022-06-23 DIAGNOSIS — G35 Multiple sclerosis: Secondary | ICD-10-CM | POA: Diagnosis not present

## 2022-06-24 DIAGNOSIS — G35 Multiple sclerosis: Secondary | ICD-10-CM | POA: Diagnosis not present

## 2022-06-24 NOTE — Progress Notes (Signed)
I reviewed the AWV findings with the provider who conducted the visit. I was present in the office suite and immediately available to provide assistance and direction throughout the time the service was provided.  

## 2022-06-25 ENCOUNTER — Encounter (HOSPITAL_BASED_OUTPATIENT_CLINIC_OR_DEPARTMENT_OTHER): Payer: Self-pay

## 2022-06-25 ENCOUNTER — Emergency Department (HOSPITAL_BASED_OUTPATIENT_CLINIC_OR_DEPARTMENT_OTHER)
Admission: EM | Admit: 2022-06-25 | Discharge: 2022-06-26 | Disposition: A | Discharge status: Home / Self Care | Payer: Medicare HMO | Attending: Emergency Medicine | Admitting: Emergency Medicine

## 2022-06-25 DIAGNOSIS — R531 Weakness: Secondary | ICD-10-CM | POA: Insufficient documentation

## 2022-06-25 DIAGNOSIS — F419 Anxiety disorder, unspecified: Secondary | ICD-10-CM | POA: Diagnosis not present

## 2022-06-25 DIAGNOSIS — R69 Illness, unspecified: Secondary | ICD-10-CM | POA: Diagnosis not present

## 2022-06-25 DIAGNOSIS — R251 Tremor, unspecified: Secondary | ICD-10-CM | POA: Diagnosis not present

## 2022-06-25 DIAGNOSIS — R258 Other abnormal involuntary movements: Secondary | ICD-10-CM | POA: Diagnosis present

## 2022-06-25 DIAGNOSIS — G35 Multiple sclerosis: Secondary | ICD-10-CM | POA: Diagnosis not present

## 2022-06-25 MED ORDER — LORAZEPAM 1 MG PO TABS
1.0000 mg | ORAL_TABLET | Freq: Once | ORAL | Status: AC
Start: 2022-06-25 — End: 2022-06-25
  Administered 2022-06-25: 1 mg via ORAL
  Filled 2022-06-25: qty 1

## 2022-06-25 MED ORDER — LORAZEPAM 1 MG PO TABS: 1.0000 mg | ORAL_TABLET | Freq: Every day | ORAL | 0 refills | Status: AC

## 2022-06-25 NOTE — ED Notes (Signed)
Mom reports pt having increased upper body movements/jerking in waiting room, pt roomed to 92

## 2022-06-25 NOTE — ED Notes (Signed)
Admits to taking ativan 1mg  in the AM once a day. Family stated that pt experiencing shaking, stuttering, jerking and HA since yesterday

## 2022-06-25 NOTE — ED Provider Notes (Signed)
[facility] EMERGENCY DEPT Provider Note   CSN: [id number] Arrival date & time: 06/25/22  2033     History  Chief Complaint  Patient presents with   withdrawl from Ativan    Ebony Warren is a 46 y.o. female.  The history is provided by the patient and medical records. No language interpreter was used.  Illness Location:  Generalized shaking intermittently consistent with previous Ativan withdrawals Quality:  Mild Severity:  Mild Onset quality:  Gradual Duration:  1 day Timing:  Intermittent Progression:  Waxing and waning Chronicity:  Recurrent Associated symptoms: no abdominal pain, no chest pain, no congestion, no cough, no diarrhea, no fatigue, no fever, no headaches, no loss of consciousness, no myalgias, no nausea, no rash, no shortness of breath, no vomiting and no wheezing        Home Medications Prior to Admission medications   Medication Sig Start Date End Date Taking? Authorizing Provider  ARIPiprazole (ABILIFY) 5 MG tablet Take 5 mg by mouth daily.    [provider]  baclofen (LIORESAL) 20 MG tablet Take 1 tablet (20 mg total) by mouth 3 (three) times daily. 05/26/22 08/19/23  Penumalli, Glenford Bayley, MD  cholecalciferol (VITAMIN D) 1000 UNITS tablet Take 1,000 Units by mouth daily.    [provider]  diphenhydrAMINE (BENADRYL) 25 mg capsule Take 25 mg by mouth every 6 (six) hours as needed for allergies.    [provider]  DULoxetine (CYMBALTA) 30 MG capsule Take 1 capsule (30 mg total) by mouth daily. 03/20/21 05/26/22  Rehman, Areeg N, DO  gabapentin (NEURONTIN) 300 MG capsule Take 2 capsules (600 mg total) by mouth 3 (three) times daily. 03/09/22 06/02/23  Penumalli, Glenford Bayley, MD  LORazepam (ATIVAN) 1 MG tablet Take 1 mg by mouth daily.    [provider]  Multiple Vitamins-Minerals (MULTIVITAMIN ADULT PO) Take 1 tablet by mouth daily.    [provider]  ocrelizumab (OCREVUS) 300 MG/10ML  injection Infuse 600 mg IV Every 6 months 05/16/19   Penumalli, Vikram R, MD  pantoprazole (PROTONIX) 40 MG tablet TAKE 1 TABLET(40 MG) BY MOUTH DAILY 05/19/21   Rehman, Areeg N, DO  tiZANidine (ZANAFLEX) 4 MG tablet Take 1 tablet (4 mg total) by mouth 3 (three) times daily. 05/26/22 08/19/23  Penumalli, Glenford Bayley, MD      Allergies    Hydrocodone, Morphine, Orange fruit [citrus], Oxycodone, Tomato, and Other    Review of Systems   Review of Systems  Constitutional:  Negative for chills, fatigue and fever.  HENT:  Negative for congestion.   Eyes:  Negative for visual disturbance.  Respiratory:  Negative for cough, chest tightness, shortness of breath and wheezing.   Cardiovascular:  Negative for chest pain.  Gastrointestinal:  Negative for abdominal pain, constipation, diarrhea, nausea and vomiting.  Genitourinary:  Negative for dysuria.  Musculoskeletal:  Negative for back pain, myalgias, neck pain and neck stiffness.  Skin:  Negative for rash and wound.  Neurological:  Positive for tremors. Negative for dizziness, seizures, loss of consciousness, syncope, light-headedness and headaches.  Psychiatric/Behavioral:  Negative for agitation and confusion.   All other systems reviewed and are negative.   Physical Exam Updated Vital Signs BP 117/85 (BP Location: Left Arm)   Pulse 60   Temp 98.3 F (36.8 C) (Oral)   Resp 18   Ht 5\' 8"  (1.727 m)   Wt 129.3 kg   LMP 06/11/2022 (Exact Date)   SpO2 100%   BMI 43.33 kg/m  Physical Exam Vitals and nursing note reviewed.  Constitutional:      General: She is not in acute distress.    Appearance: She is well-developed. She is not ill-appearing, toxic-appearing or diaphoretic.  HENT:     Head: Normocephalic and atraumatic.     Nose: No congestion.     Mouth/Throat:     Mouth: Mucous membranes are moist.  Eyes:     Extraocular Movements: Extraocular movements intact.     Conjunctiva/sclera: Conjunctivae normal.     Pupils: Pupils are  equal, round, and reactive to light.  Cardiovascular:     Rate and Rhythm: Normal rate and regular rhythm.     Heart sounds: No murmur heard. Pulmonary:     Effort: Pulmonary effort is normal. No respiratory distress.     Breath sounds: Normal breath sounds.  Abdominal:     Palpations: Abdomen is soft.     Tenderness: There is no abdominal tenderness. There is no right CVA tenderness, left CVA tenderness, guarding or rebound.  Musculoskeletal:        General: No swelling or tenderness.     Cervical back: Neck supple. No tenderness.     Right lower leg: No edema.     Left lower leg: No edema.  Skin:    General: Skin is warm and dry.     Capillary Refill: Capillary refill takes less than 2 seconds.     Findings: No erythema.  Neurological:     Mental Status: She is alert. Mental status is at baseline.     Sensory: No sensory deficit.     Motor: Weakness present.  Psychiatric:        Mood and Affect: Mood normal.     ED Results / Procedures / Treatments   Labs (all labs ordered are listed, but only abnormal results are displayed) Labs Reviewed - No data to display  EKG None  Radiology No results found.  Procedures Procedures    Medications Ordered in ED Medications  LORazepam (ATIVAN) tablet 1 mg (1 mg Oral Given 06/25/22 2234)    ED Course/ Medical Decision Making/ A&P                           Medical Decision Making Risk Prescription drug management.    Ebony Warren is a 46 y.o. female with a past medical history significant for multiple sclerosis, anxiety, depression, previous seizures, and GERD who presents with agitation and tremors suspected to be related to missing Ativan.  According to patient and family, there was a problem ran out yesterday and the prescription will not be filled until next week.  Takes 1 mg of Ativan daily and as she has not had it today, started getting more agitated and having shaking episodes.  Family reports this is  how she gets when she misses her Ativan but otherwise she has not had any fevers, chills, chest pain shortness of breath, nausea, vomiting, constipation, diarrhea, or urinary changes.  She chronically has weakness from her MS and they report there are no other new neurologic deficits at this time.  On my exam, patient has some shaking tremors but otherwise does not appear in acute distress.  She appears anxious.  Lungs clear and chest nontender.  Abdomen nontender.  She has weakness in her right arm and right leg compared to the left which they report is at her baseline.  Patient is very anxious appearing.  Had a shared  decision-making conversation offering more extensive work-up with labs due to the shaking however they simply want her to get dose Ativan to see how she does.  We gave her 1 mg of oral Ativan and her symptoms quickly resolved.  She is back to baseline and feeling much better.  Due to the probable with her prescription, we will give her a prescription for Ativan sent to the 24-hour pharmacy tonight to pick up to help get her till her long prescription can be filled.  They do not want any further work-up and given her well appearance we agree.  They understand return precautions and follow-up instructions patient discharged in good condition with resolution of symptoms.           Final Clinical Impression(s) / ED Diagnoses Final diagnoses:  Shaking    Rx / DC Orders ED Discharge Orders          Ordered    LORazepam (ATIVAN) 1 MG tablet  Daily        06/25/22 2320            Clinical Impression: 1. Shaking     Disposition: Discharge  Condition: Good  I have discussed the results, Dx and Tx plan with the pt(& family if present). He/she/they expressed understanding and agree(s) with the plan. Discharge instructions discussed at great length. Strict return precautions discussed and pt &/or family have verbalized understanding of the instructions. No further  questions at time of discharge.    New Prescriptions   LORAZEPAM (ATIVAN) 1 MG TABLET    Take 1 tablet (1 mg total) by mouth daily.    Follow Up: Lottie Mussel, MD [place], [place] [address] [facility] [place] [phone]       Josia Cueva, Gwenyth Allegra, MD 06/26/22 [phone]

## 2022-06-25 NOTE — Discharge Instructions (Signed)
Your history and exam today are consistent with not getting her Ativan you have been on for quite some time.  We did not see evidence of acute seizure and you had no other concerning symptoms or findings on exam.  After oral Ativan you had resolution of symptoms and feel much better.  Please fill the new prescription for Ativan to have it for the next few days until your longer prescription is received.  We had a shared decision-making conversation offering further labs and work-up but given your resolution of symptoms we agreed to hold on this at this time.  If any symptoms change or worsen acutely, please return to the nearest Emergency Department.

## 2022-06-25 NOTE — ED Notes (Signed)
[facility] called @ 2315.

## 2022-06-25 NOTE — ED Triage Notes (Addendum)
Pt has MS and hx of seizures.  Pt takes Ativan and has been out 1 days.  Pt is stuttering and Mom is concerned she is going to have seizure.   Last dose 1am this morning There was a mix up with mail in pharmacy and will not get meds for 1 week.

## 2022-06-26 DIAGNOSIS — M79606 Pain in leg, unspecified: Secondary | ICD-10-CM | POA: Diagnosis not present

## 2022-06-26 DIAGNOSIS — G35 Multiple sclerosis: Secondary | ICD-10-CM | POA: Diagnosis not present

## 2022-06-26 DIAGNOSIS — Z7401 Bed confinement status: Secondary | ICD-10-CM | POA: Diagnosis not present

## 2022-06-27 NOTE — Progress Notes (Signed)
AWV documentation reviewed.  A number of screening and preventative opportunities and goal setting will be addressed by PCP.

## 2022-06-29 DIAGNOSIS — G35 Multiple sclerosis: Secondary | ICD-10-CM | POA: Diagnosis not present

## 2022-06-30 DIAGNOSIS — G35 Multiple sclerosis: Secondary | ICD-10-CM | POA: Diagnosis not present

## 2022-07-01 DIAGNOSIS — G35 Multiple sclerosis: Secondary | ICD-10-CM | POA: Diagnosis not present

## 2022-07-02 DIAGNOSIS — G35 Multiple sclerosis: Secondary | ICD-10-CM | POA: Diagnosis not present

## 2022-07-03 DIAGNOSIS — G35 Multiple sclerosis: Secondary | ICD-10-CM | POA: Diagnosis not present

## 2022-07-08 DIAGNOSIS — G35 Multiple sclerosis: Secondary | ICD-10-CM | POA: Diagnosis not present

## 2022-07-23 DIAGNOSIS — Z79899 Other long term (current) drug therapy: Secondary | ICD-10-CM | POA: Diagnosis not present

## 2022-07-23 DIAGNOSIS — F330 Major depressive disorder, recurrent, mild: Secondary | ICD-10-CM | POA: Diagnosis not present

## 2022-07-23 DIAGNOSIS — F411 Generalized anxiety disorder: Secondary | ICD-10-CM | POA: Diagnosis not present

## 2022-07-23 DIAGNOSIS — R69 Illness, unspecified: Secondary | ICD-10-CM | POA: Diagnosis not present

## 2022-08-08 DIAGNOSIS — G35 Multiple sclerosis: Secondary | ICD-10-CM | POA: Diagnosis not present

## 2022-08-18 NOTE — Progress Notes (Unsigned)
[facility]: Clinic Note  Subjective:  History of Present Illness: Ebony Warren is a 46 y.o. year old female who presents for establishing care with me. She has been seen in the [facility] clinic before.  Her main concern today is interest in losing weight. She's gained 15 pounds since the summer. No heat intolerance, constipation, changes to hair, flushing, or history of thyroid issues. She was previously on Ozempic, but was no longer able to get this covered by insurance, so came off of it. BMI 45.77. Her physical activity is limited due to her MS. She makes an effort to eat healthy foods. She's interested in going back on Ozempic or something similar.  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 They are not painful. Do not change colors or tingle when cold. She is able to straighten them with effort, but in resting state, they go back to closed. No other new neurologic symptoms.  Seeing psychiatry for depression & anxiety - no new mental health concerns today.    Please refer to Assessment and Plan below for full details in Problem-Based Charting.   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   Other fatigue 01/27/2016   Depression 01/27/2016   Absence of bladder continence 01/27/2016   Multiple sclerosis (HCC) 10/20/2014   Hemiplegia of dominant side (HCC) 08/30/2014   Mixed, or nondependent drug abuse 03/05/2014   Migraines 09/20/2012   Seizures (HCC) 09/19/2012     Medications:  Current Outpatient Medications:     DULoxetine (CYMBALTA) 30 MG capsule, Take 1 capsule by mouth daily., Disp: , Rfl:    LORazepam (ATIVAN) 1 MG tablet, Take 1 tablet by mouth daily., Disp: , Rfl:    ARIPiprazole (ABILIFY) 5 MG tablet, Take 5 mg by mouth daily., Disp: , Rfl:    baclofen (LIORESAL) 20 MG tablet, Take 1 tablet (20 mg total) by mouth 3 (three) times daily., Disp: 270 tablet, Rfl: 4   cholecalciferol (VITAMIN D) 1000 UNITS tablet, Take 1,000 Units by mouth daily., Disp: , Rfl:    diphenhydrAMINE (BENADRYL) 25 mg capsule, Take 25 mg by mouth every 6 (six) hours as needed for allergies., Disp: , Rfl:    furosemide (LASIX) 20 MG tablet, Take 1 tablet (20 mg total) by mouth daily as needed., Disp: 90 tablet, Rfl: 3   gabapentin (NEURONTIN) 300 MG capsule, Take 2 capsules (600 mg total) by mouth 3 (three) times daily., Disp: 540 capsule, Rfl: 4   LORazepam (ATIVAN) 1 MG tablet, Take 1 mg by mouth daily., Disp: , Rfl:    LORazepam (ATIVAN) 1 MG tablet, Take 1 tablet (1 mg total) by mouth daily., Disp: 14 tablet, Rfl: 0   Multiple Vitamins-Minerals (MULTIVITAMIN ADULT PO), Take 1 tablet by mouth daily., Disp: , Rfl:    ocrelizumab (OCREVUS) 300 MG/10ML injection, Infuse 600 mg IV Every 6 months, Disp: 20 mL, Rfl: 1   oxybutynin (DITROPAN-XL) 10 MG 24 hr tablet, Take 1 tablet (10 mg total) by mouth daily as needed (to prevent incontinence)., Disp: 90 tablet, Rfl: 3  pantoprazole (PROTONIX) 40 MG tablet, Take 1 tablet (40 mg total) by mouth daily., Disp: 90 tablet, Rfl: 3   tiZANidine (ZANAFLEX) 4 MG tablet, Take 1 tablet (4 mg total) by mouth 3 (three) times daily., Disp: 270 tablet, Rfl: 4   Allergies: Allergies  Allergen Reactions   Bioflavonoids Hives   Oxycodone-Acetaminophen Itching   Hydrocodone Itching    Stomach cramps   Morphine Itching   Orange Fruit [Citrus]     Acidic foods Makes her itchy and her face "feels like its on fire", have rash.   Oxycodone Itching    rash   Tomato Hives   Other Rash     Narcotics. Spicy foods (cause seizures)    Objective:   Vitals: Vitals:   08/19/22 1011 08/19/22 1049  BP: (!) 157/92 (!) 147/96  Pulse: 71 66  Temp: 98.1 F (36.7 C)   SpO2: 100%     Physical Exam: Physical Exam Constitutional:      Appearance: Normal appearance. She is obese.     Comments: Sitting comfortably in wheelchair  Cardiovascular:     Rate and Rhythm: Normal rate and regular rhythm.     Pulses: Normal pulses.     Heart sounds: Normal heart sounds. No murmur heard. Pulmonary:     Effort: Pulmonary effort is normal.     Breath sounds: Normal breath sounds.  Musculoskeletal:     Right lower leg: Edema present.     Left lower leg: Edema present.     Comments: R 4th & 5th fingers are contracted, but open with effort. 2+ radial pulses bilaterally. Sensation in tact  Skin:    General: Skin is warm and dry.  Neurological:     Mental Status: She is alert.      Data: Labs, imaging, and micro were reviewed in Epic. Refer to Assessment and Plan below for full details in Problem-Based Charting.  Assessment & Plan:  Morbid obesity (HCC) - Patient has obesity with BMI 45.77, and she's interested in weight loss - check A1C today. If she has diabetes, I think we'd have an easier time getting Ozempic or similar medicine covered.  - Referral to medical nutrition therapy - Referral to [facility] exercise program, in consultation with patient's Neurologist about any limitations she has from her MS - Will plan on GLP1 pending A1C  Absence of bladder continence - Start Oxybutynin 10mg  daily prn bladder spasms. She had previously been on this, but needs a refill   Multiple sclerosis (HCC) - Continue to follow with Neurology, no changes made to medicines or care plan today - I think her right hand 4th & 5th finger contractures may be related to her MS. She has a strong radial pulse, so I'm not worried about a vascular problem. No trauma to the fingers, I don't think we need to get  imaging. Her sensation is intact. I offered physical therapy, and patient politely declined. I don't see a role for EMG here. Will just monitor for now.  Single episode of elevated blood pressure - BP was mildly elevated today, it has been normal previously. No headaches or chest pain. Patient had been taken off her lasix (prescribed for leg swelling before), and we're restarting this today. Will follow up BP at next visit after adding the lasix   Healthcare maintenance - Patient should not get flu shot while on current biologic, per neuro      Patient will follow up in 6 months, sooner if needed    Lafonda Mosses, MD

## 2022-08-19 ENCOUNTER — Ambulatory Visit (INDEPENDENT_AMBULATORY_CARE_PROVIDER_SITE_OTHER): Payer: Medicare HMO | Admitting: Internal Medicine

## 2022-08-19 ENCOUNTER — Encounter: Payer: Self-pay | Admitting: Internal Medicine

## 2022-08-19 VITALS — BP 147/96 | HR 66 | Temp 98.1°F | Ht 68.0 in | Wt 301.0 lb

## 2022-08-19 DIAGNOSIS — R030 Elevated blood-pressure reading, without diagnosis of hypertension: Secondary | ICD-10-CM | POA: Diagnosis not present

## 2022-08-19 DIAGNOSIS — U071 COVID-19: Secondary | ICD-10-CM

## 2022-08-19 DIAGNOSIS — Z131 Encounter for screening for diabetes mellitus: Secondary | ICD-10-CM

## 2022-08-19 DIAGNOSIS — R32 Unspecified urinary incontinence: Secondary | ICD-10-CM

## 2022-08-19 DIAGNOSIS — G35 Multiple sclerosis: Secondary | ICD-10-CM | POA: Diagnosis not present

## 2022-08-19 DIAGNOSIS — Z6841 Body Mass Index (BMI) 40.0 and over, adult: Secondary | ICD-10-CM | POA: Diagnosis not present

## 2022-08-19 DIAGNOSIS — Z0000 Encounter for general adult medical examination without abnormal findings: Secondary | ICD-10-CM

## 2022-08-19 MED ORDER — OXYBUTYNIN CHLORIDE ER 10 MG PO TB24: 10.0000 mg | ORAL_TABLET | Freq: Every day | ORAL | 3 refills | Status: DC | PRN

## 2022-08-19 MED ORDER — PANTOPRAZOLE SODIUM 40 MG PO TBEC: 40.0000 mg | DELAYED_RELEASE_TABLET | Freq: Every day | ORAL | 3 refills | Status: DC

## 2022-08-19 MED ORDER — FUROSEMIDE 20 MG PO TABS
20.0000 mg | ORAL_TABLET | Freq: Every day | ORAL | 3 refills | Status: DC | PRN
Start: 2022-08-19 — End: 2023-03-24

## 2022-08-19 NOTE — Assessment & Plan Note (Signed)
-   Patient should not get flu shot while on current biologic, per neuro

## 2022-08-19 NOTE — Assessment & Plan Note (Signed)
-   BP was mildly elevated today, it has been normal previously. No headaches or chest pain. Patient had been taken off her lasix (prescribed for leg swelling before), and we're restarting this today. Will follow up BP at next visit after adding the lasix

## 2022-08-19 NOTE — Assessment & Plan Note (Signed)
-   Patient has obesity with BMI 45.77, and she's interested in weight loss - check A1C today. If she has diabetes, I think we'd have an easier time getting Ozempic or similar medicine covered.  - Referral to medical nutrition therapy - Referral to [facility] exercise program, in consultation with patient's Neurologist about any limitations she has from her MS - Will plan on GLP1 pending A1C

## 2022-08-19 NOTE — Assessment & Plan Note (Signed)
-   Continue to follow with Neurology, no changes made to medicines or care plan today - I think her right hand 4th & 5th finger contractures may be related to her MS. She has a strong radial pulse, so I'm not worried about a vascular problem. No trauma to the fingers, I don't think we need to get imaging. Her sensation is intact. I offered physical therapy, and patient politely declined. I don't see a role for EMG here. Will just monitor for now.

## 2022-08-19 NOTE — Assessment & Plan Note (Signed)
-   Start Oxybutynin 10mg  daily prn bladder spasms. She had previously been on this, but needs a refill

## 2022-08-19 NOTE — Patient Instructions (Signed)
It was a pleasure seeing you today.  I am checking an A1C to screen you for diabetes. I'll call you with those results.  I have placed a referral for you to see our nutritionist for weight loss, and I've placed a referral to the [facility] program where you get a free membership for 12 weeks. Please consult with your Neurologist to clarify exactly what exercises you can & cannot do with your MS, and bring this information to your [facility] program.   I have sent your refills in to your pharmacy. You can take the lasix as needed, and don't need to take it if you'll be going out.  We will see you back in 6 months, please call if you need anything sooner.  Dr. Mercie Eon

## 2022-08-20 ENCOUNTER — Other Ambulatory Visit (HOSPITAL_COMMUNITY): Payer: Self-pay

## 2022-08-20 LAB — HEMOGLOBIN A1C
Est. average glucose Bld gHb Est-mCnc: 105 mg/dL
Hgb A1c MFr Bld: 5.3 % (ref 4.8–5.6)

## 2022-08-20 NOTE — Progress Notes (Signed)
Called patient to review labs. A1C 5.3, she does not have diabetes. I have reached out to pharmacy to see if semaglutide, liraglutide, or tirzepatide might be covered by [facility] for the indication of obesity with BMI 45. Patient has been on Ozempic previously, but had a very hard time obtaining this from the pharmacy, so would prefer an alternative if possible. She would have had to pay out of pocket to see Lupita Leash for MNT since no diabetes, so she's not going to do this. Will first start with the [facility] for weight loss.

## 2022-08-21 ENCOUNTER — Telehealth: Payer: Self-pay | Admitting: Internal Medicine

## 2022-08-21 MED ORDER — SEMAGLUTIDE(0.25 OR 0.5MG/DOS) 2 MG/3ML ~~LOC~~ SOPN: 0.2500 mg | PEN_INJECTOR | SUBCUTANEOUS | 3 refills | Status: DC

## 2022-08-21 NOTE — Telephone Encounter (Signed)
Called patient to discuss updates from pharmacy.   Patient is interested in taking a medicine for weight loss. I talked to [place], and [place] requires a prior [place]. Other options Reginal Lutes, Amelia, White) are non-formulary and not covered, so I think Ozempic would be the only option.  I have called Ozempic into patient's pharmacy, and instructed patient to see how much it will cost her.   If not covered by insurance, she will need to sign an application with Durward Mallard and provide proof of insurance.   I have asked Durward Mallard to reach out to patient next week to 1) see if Ozempic was covered and 2) if not covered, set up a time for patient to come do the paperwork.   Will also alert my co-workers about this pending issue.

## 2022-08-24 ENCOUNTER — Telehealth: Payer: Self-pay

## 2022-08-24 NOTE — Telephone Encounter (Signed)
Prior Authorization for patient (ozempic) came through on cover my meds was submitted awaiting approval or denial

## 2022-08-25 ENCOUNTER — Encounter: Payer: Self-pay | Admitting: Diagnostic Neuroimaging

## 2022-08-26 DIAGNOSIS — F411 Generalized anxiety disorder: Secondary | ICD-10-CM | POA: Diagnosis not present

## 2022-08-26 DIAGNOSIS — F329 Major depressive disorder, single episode, unspecified: Secondary | ICD-10-CM | POA: Diagnosis not present

## 2022-08-26 DIAGNOSIS — R69 Illness, unspecified: Secondary | ICD-10-CM | POA: Diagnosis not present

## 2022-08-26 DIAGNOSIS — Z79899 Other long term (current) drug therapy: Secondary | ICD-10-CM | POA: Diagnosis not present

## 2022-08-27 ENCOUNTER — Other Ambulatory Visit (HOSPITAL_COMMUNITY): Payer: Self-pay

## 2022-08-27 NOTE — Telephone Encounter (Signed)
Contacted [facility] for calcification, question #5 needed to be corrected. Forms completed and placed on MD desk for signature upon his return.

## 2022-08-31 DIAGNOSIS — G35 Multiple sclerosis: Secondary | ICD-10-CM | POA: Diagnosis not present

## 2022-09-01 DIAGNOSIS — G35 Multiple sclerosis: Secondary | ICD-10-CM | POA: Diagnosis not present

## 2022-09-01 NOTE — Telephone Encounter (Addendum)
Access [place] forms completed, signed and faxed to [facility] at [phone]. Received confirmation. Notified patient via my chart.

## 2022-09-02 DIAGNOSIS — G35 Multiple sclerosis: Secondary | ICD-10-CM | POA: Diagnosis not present

## 2022-09-03 DIAGNOSIS — G35 Multiple sclerosis: Secondary | ICD-10-CM | POA: Diagnosis not present

## 2022-09-04 DIAGNOSIS — G35 Multiple sclerosis: Secondary | ICD-10-CM | POA: Diagnosis not present

## 2022-09-07 DIAGNOSIS — G35 Multiple sclerosis: Secondary | ICD-10-CM | POA: Diagnosis not present

## 2022-09-08 DIAGNOSIS — G35 Multiple sclerosis: Secondary | ICD-10-CM | POA: Diagnosis not present

## 2022-09-09 DIAGNOSIS — G35 Multiple sclerosis: Secondary | ICD-10-CM | POA: Diagnosis not present

## 2022-09-10 DIAGNOSIS — G35 Multiple sclerosis: Secondary | ICD-10-CM | POA: Diagnosis not present

## 2022-09-11 DIAGNOSIS — G35 Multiple sclerosis: Secondary | ICD-10-CM | POA: Diagnosis not present

## 2022-09-14 DIAGNOSIS — G35 Multiple sclerosis: Secondary | ICD-10-CM | POA: Diagnosis not present

## 2022-09-15 DIAGNOSIS — G35 Multiple sclerosis: Secondary | ICD-10-CM | POA: Diagnosis not present

## 2022-09-16 DIAGNOSIS — G35 Multiple sclerosis: Secondary | ICD-10-CM | POA: Diagnosis not present

## 2022-09-17 DIAGNOSIS — G35 Multiple sclerosis: Secondary | ICD-10-CM | POA: Diagnosis not present

## 2022-09-18 DIAGNOSIS — G35 Multiple sclerosis: Secondary | ICD-10-CM | POA: Diagnosis not present

## 2022-09-21 DIAGNOSIS — G35 Multiple sclerosis: Secondary | ICD-10-CM | POA: Diagnosis not present

## 2022-09-22 DIAGNOSIS — G35 Multiple sclerosis: Secondary | ICD-10-CM | POA: Diagnosis not present

## 2022-09-23 DIAGNOSIS — G35 Multiple sclerosis: Secondary | ICD-10-CM | POA: Diagnosis not present

## 2022-09-24 DIAGNOSIS — G35 Multiple sclerosis: Secondary | ICD-10-CM | POA: Diagnosis not present

## 2022-09-25 DIAGNOSIS — G35 Multiple sclerosis: Secondary | ICD-10-CM | POA: Diagnosis not present

## 2022-09-28 DIAGNOSIS — G35 Multiple sclerosis: Secondary | ICD-10-CM | POA: Diagnosis not present

## 2022-09-29 DIAGNOSIS — G35 Multiple sclerosis: Secondary | ICD-10-CM | POA: Diagnosis not present

## 2022-09-30 ENCOUNTER — Telehealth: Payer: Self-pay

## 2022-09-30 DIAGNOSIS — G35 Multiple sclerosis: Secondary | ICD-10-CM | POA: Diagnosis not present

## 2022-09-30 NOTE — Telephone Encounter (Signed)
Prior Authorization for patient came through via fax. Cannot complete prior authorization due to the pharmacy spelling the patients name incorrectly. I have faxed the pharmacy requesting a new prior authorization form with the patients name spelled correctly.     

## 2022-10-01 DIAGNOSIS — G35 Multiple sclerosis: Secondary | ICD-10-CM | POA: Diagnosis not present

## 2022-10-02 DIAGNOSIS — G35 Multiple sclerosis: Secondary | ICD-10-CM | POA: Diagnosis not present

## 2022-10-05 DIAGNOSIS — G35 Multiple sclerosis: Secondary | ICD-10-CM | POA: Diagnosis not present

## 2022-10-06 DIAGNOSIS — G35 Multiple sclerosis: Secondary | ICD-10-CM | POA: Diagnosis not present

## 2022-10-07 DIAGNOSIS — G35 Multiple sclerosis: Secondary | ICD-10-CM | POA: Diagnosis not present

## 2022-10-08 DIAGNOSIS — G35 Multiple sclerosis: Secondary | ICD-10-CM | POA: Diagnosis not present

## 2022-10-09 DIAGNOSIS — G35 Multiple sclerosis: Secondary | ICD-10-CM | POA: Diagnosis not present

## 2022-10-12 DIAGNOSIS — G35 Multiple sclerosis: Secondary | ICD-10-CM | POA: Diagnosis not present

## 2022-10-13 DIAGNOSIS — G35 Multiple sclerosis: Secondary | ICD-10-CM | POA: Diagnosis not present

## 2022-10-14 DIAGNOSIS — F329 Major depressive disorder, single episode, unspecified: Secondary | ICD-10-CM | POA: Diagnosis not present

## 2022-10-14 DIAGNOSIS — Z79899 Other long term (current) drug therapy: Secondary | ICD-10-CM | POA: Diagnosis not present

## 2022-10-14 DIAGNOSIS — F411 Generalized anxiety disorder: Secondary | ICD-10-CM | POA: Diagnosis not present

## 2022-10-14 DIAGNOSIS — R69 Illness, unspecified: Secondary | ICD-10-CM | POA: Diagnosis not present

## 2022-10-14 DIAGNOSIS — G35 Multiple sclerosis: Secondary | ICD-10-CM | POA: Diagnosis not present

## 2022-10-15 DIAGNOSIS — G35 Multiple sclerosis: Secondary | ICD-10-CM | POA: Diagnosis not present

## 2022-10-16 DIAGNOSIS — G35 Multiple sclerosis: Secondary | ICD-10-CM | POA: Diagnosis not present

## 2022-10-19 DIAGNOSIS — G35 Multiple sclerosis: Secondary | ICD-10-CM | POA: Diagnosis not present

## 2022-10-20 DIAGNOSIS — G35 Multiple sclerosis: Secondary | ICD-10-CM | POA: Diagnosis not present

## 2022-10-20 DIAGNOSIS — H40023 Open angle with borderline findings, high risk, bilateral: Secondary | ICD-10-CM | POA: Diagnosis not present

## 2022-10-21 DIAGNOSIS — G35 Multiple sclerosis: Secondary | ICD-10-CM | POA: Diagnosis not present

## 2022-10-22 DIAGNOSIS — G35 Multiple sclerosis: Secondary | ICD-10-CM | POA: Diagnosis not present

## 2022-10-23 DIAGNOSIS — G35 Multiple sclerosis: Secondary | ICD-10-CM | POA: Diagnosis not present

## 2022-10-26 DIAGNOSIS — G35 Multiple sclerosis: Secondary | ICD-10-CM | POA: Diagnosis not present

## 2022-10-27 ENCOUNTER — Other Ambulatory Visit: Payer: Self-pay

## 2022-10-27 ENCOUNTER — Ambulatory Visit (INDEPENDENT_AMBULATORY_CARE_PROVIDER_SITE_OTHER): Payer: Medicare HMO | Admitting: Internal Medicine

## 2022-10-27 ENCOUNTER — Encounter: Payer: Self-pay | Admitting: Internal Medicine

## 2022-10-27 VITALS — BP 150/88 | HR 63 | Temp 97.7°F | Ht 68.0 in | Wt 311.7 lb

## 2022-10-27 DIAGNOSIS — N926 Irregular menstruation, unspecified: Secondary | ICD-10-CM | POA: Insufficient documentation

## 2022-10-27 DIAGNOSIS — G35 Multiple sclerosis: Secondary | ICD-10-CM | POA: Diagnosis not present

## 2022-10-27 NOTE — Progress Notes (Signed)
CC: Abnormal menses  HPI:  [name] is a 46 y.o. person with past medical history as detailed below who presents today with complaint of her most recent menstrual cycle lasting 19 days.  Please see promise charting for detail assessment and plan.  Past Medical History:  Diagnosis Date   Anxiety and depression    Arthritis    Bradycardia    Encounter to establish care 01/22/2021   Family history of adverse reaction to anesthesia    nausea   GERD (gastroesophageal reflux disease)    Headache    Multiple sclerosis (HCC)    Screening mammogram, encounter for 01/22/2021   Seizures (HCC)    told that they are not from the brain but stress related   Review of Systems: Negative unless otherwise stated.  Physical Exam:  Vitals:   10/27/22 0933  BP: (!) 150/88  Pulse: 63  Temp: 97.7 F (36.5 C)  TempSrc: Oral  SpO2: 91%  Weight: (!) 311 lb 11.2 oz (141.4 kg)  Height: 5\' 8"  (1.727 m)   Constitutional:Appears well, in no acute distress. HENT: Mild mucosal pallor. Eyes: Mild conjunctival pallor. Cardio:Regular rate and rhythm. No murmurs, rubs, or gallops. Pulm:Clear to auscultation bilaterally. Normal work of breathing on room air. MSK: Bilateral lower extremity 1+ edema. Skin:Warm and dry.  Mild leukonychia of bilateral hands.  Capillary refill less than 2 seconds. Neuro:Alert and oriented x3. No focal deficit noted. Psych:Pleasant mood and affect.  Assessment & Plan:   See Encounters Tab for problem based charting.  Abnormal menses Patient presents today due to her most recent menstrual cycle lasting 19 days.  She explains that prior to her October cycle, she experienced menses from September 24 through 28 and with the exception of when she has been pregnant, her cycles occur at regular 28-day intervals.  When she had this most recent cycle from October 2017 through November 13 she had spotting initially that alternated with heavy flow throughout  this time period.  She has never had a menstrual cycle like this before.  She is not currently sexually active and has not been since 2017.  She was on birth control as a teenager but did not tolerate it well due to issues with having irregular cycle and weight gain.  She was on NuvaRing for some time after she had her son but has not had any sort of contraception in several years.  She does not smoke, does not have history of clotting disorder, does not have history of endocrine disorder, no nipple discharge.  She has a history of iron deficiency anemia and at this time takes a daily multivitamin and vitamin D supplement; her iron studies were low normal when last checked in September 2022.  She denies dizziness or pica but does have some fatigue which seems to be more of a chronic problem.  Her last Pap smear was in September 2022 and was normal.  She does get migraines.  She endorses occasional hot flashes in the middle of the night but denies mood changes.  She denies lower abdominal pressure.  She has not missed a menstrual cycle since pregnancy.  Her mom was in her late 85s when she went through menopause and her grandmother had a child at age 66 and did not have menopause until her 57s as well.  Of note, her mother was diagnosed with endometrial cancer 1 month ago.  Her presenting symptom was return of menses after having started menopause. Assessment: Patient will not  be due for her next menstrual cycle for roughly 3 weeks.  Given her mother's recent diagnosis and likely need for transvaginal ultrasound, I feel that she will be best served by an obstetrician. Plan: Check CBC and iron studies today given prolonged menses and history of iron deficiency anemia.  Referral to OB placed for further assessment and treatment if necessary.  Patient discussed with Dr. Heide Spark

## 2022-10-27 NOTE — Assessment & Plan Note (Signed)
Patient presents today due to her most recent menstrual cycle lasting 19 days.  She explains that prior to her October cycle, she experienced menses from September 24 through 28 and with the exception of when she has been pregnant, her cycles occur at regular 28-day intervals.  When she had this most recent cycle from October 2017 through November 13 she had spotting initially that alternated with heavy flow throughout this time period.  She has never had a menstrual cycle like this before.  She is not currently sexually active and has not been since 2017.  She was on birth control as a teenager but did not tolerate it well due to issues with having irregular cycle and weight gain.  She was on NuvaRing for some time after she had her son but has not had any sort of contraception in several years.  She does not smoke, does not have history of clotting disorder, does not have history of endocrine disorder, no nipple discharge.  She has a history of iron deficiency anemia and at this time takes a daily multivitamin and vitamin D supplement; her iron studies were low normal when last checked in September 2022.  She denies dizziness or pica but does have some fatigue which seems to be more of a chronic problem.  Her last Pap smear was in September 2022 and was normal.  She does get migraines.  She endorses occasional hot flashes in the middle of the night but denies mood changes.  She denies lower abdominal pressure.  She has not missed a menstrual cycle since pregnancy.  Her mom was in her late 68s when she went through menopause and her grandmother had a child at age 29 and did not have menopause until her 61s as well.  Of note, her mother was diagnosed with endometrial cancer 1 month ago.  Her presenting symptom was return of menses after having started menopause. Assessment: Patient will not be due for her next menstrual cycle for roughly 3 weeks.  Given her mother's recent diagnosis and likely need for transvaginal  ultrasound, I feel that she will be best served by an obstetrician. Plan: Check CBC and iron studies today given prolonged menses and history of iron deficiency anemia.  Referral to OB placed for further assessment and treatment if necessary.

## 2022-10-27 NOTE — Patient Instructions (Signed)
[name],  It was a pleasure to care for you today. I am going to check some labs to make sure your blood counts are at a safe level and will let you know what these results show. I have also placed a referral to OB/GYN for further work-up of your abnormal menses.  Please let our office know if you do not hear from their office to schedule your appointment in 2 weeks.  My best, Dr. August Saucer

## 2022-10-28 ENCOUNTER — Telehealth: Payer: Self-pay | Admitting: *Deleted

## 2022-10-28 DIAGNOSIS — G35 Multiple sclerosis: Secondary | ICD-10-CM | POA: Diagnosis not present

## 2022-10-28 LAB — CBC
Hematocrit: 45.3 % (ref 34.0–46.6)
Hemoglobin: 14.8 g/dL (ref 11.1–15.9)
MCH: 31 pg (ref 26.6–33.0)
MCHC: 32.7 g/dL (ref 31.5–35.7)
MCV: 95 fL (ref 79–97)
Platelets: 279 10*3/uL (ref 150–450)
RBC: 4.77 x10E6/uL (ref 3.77–5.28)
RDW: 12.1 % (ref 11.7–15.4)
WBC: 6.3 10*3/uL (ref 3.4–10.8)

## 2022-10-28 LAB — IRON,TIBC AND FERRITIN PANEL
Ferritin: 41 ng/mL (ref 15–150)
Iron Saturation: 22 % (ref 15–55)
Iron: 68 ug/dL (ref 27–159)
Total Iron Binding Capacity: 313 ug/dL (ref 250–450)
UIBC: 245 ug/dL (ref 131–425)

## 2022-10-28 NOTE — Progress Notes (Signed)
Internal Medicine Clinic Attending ? ?Case discussed with Dr. Dean  At the time of the visit.  We reviewed the resident?s history and exam and pertinent patient test results.  I agree with the assessment, diagnosis, and plan of care documented in the resident?s note.  ?

## 2022-10-28 NOTE — Telephone Encounter (Signed)
Contacted yesterday regarding PREP Class referral. Patient was busy and returned my call today. She is interested in participating in the program at [facility]. Will contact back when 2024 class availability is determined.

## 2022-11-02 DIAGNOSIS — G35 Multiple sclerosis: Secondary | ICD-10-CM | POA: Diagnosis not present

## 2022-11-03 DIAGNOSIS — G35 Multiple sclerosis: Secondary | ICD-10-CM | POA: Diagnosis not present

## 2022-11-04 DIAGNOSIS — G35 Multiple sclerosis: Secondary | ICD-10-CM | POA: Diagnosis not present

## 2022-11-05 DIAGNOSIS — G35 Multiple sclerosis: Secondary | ICD-10-CM | POA: Diagnosis not present

## 2022-11-06 DIAGNOSIS — G35 Multiple sclerosis: Secondary | ICD-10-CM | POA: Diagnosis not present

## 2022-11-09 DIAGNOSIS — G35 Multiple sclerosis: Secondary | ICD-10-CM | POA: Diagnosis not present

## 2022-11-10 DIAGNOSIS — G35 Multiple sclerosis: Secondary | ICD-10-CM | POA: Diagnosis not present

## 2022-11-11 DIAGNOSIS — G35 Multiple sclerosis: Secondary | ICD-10-CM | POA: Diagnosis not present

## 2022-11-12 DIAGNOSIS — G35 Multiple sclerosis: Secondary | ICD-10-CM | POA: Diagnosis not present

## 2022-11-13 DIAGNOSIS — G35 Multiple sclerosis: Secondary | ICD-10-CM | POA: Diagnosis not present

## 2022-11-16 DIAGNOSIS — G35 Multiple sclerosis: Secondary | ICD-10-CM | POA: Diagnosis not present

## 2022-11-17 DIAGNOSIS — G35 Multiple sclerosis: Secondary | ICD-10-CM | POA: Diagnosis not present

## 2022-11-18 DIAGNOSIS — G35 Multiple sclerosis: Secondary | ICD-10-CM | POA: Diagnosis not present

## 2022-11-19 DIAGNOSIS — G35 Multiple sclerosis: Secondary | ICD-10-CM | POA: Diagnosis not present

## 2022-11-20 DIAGNOSIS — G35 Multiple sclerosis: Secondary | ICD-10-CM | POA: Diagnosis not present

## 2022-11-23 DIAGNOSIS — G35 Multiple sclerosis: Secondary | ICD-10-CM | POA: Diagnosis not present

## 2022-11-24 DIAGNOSIS — G35 Multiple sclerosis: Secondary | ICD-10-CM | POA: Diagnosis not present

## 2022-11-25 DIAGNOSIS — G35 Multiple sclerosis: Secondary | ICD-10-CM | POA: Diagnosis not present

## 2022-11-26 DIAGNOSIS — G35 Multiple sclerosis: Secondary | ICD-10-CM | POA: Diagnosis not present

## 2022-11-27 DIAGNOSIS — G35 Multiple sclerosis: Secondary | ICD-10-CM | POA: Diagnosis not present

## 2022-12-01 DIAGNOSIS — G35 Multiple sclerosis: Secondary | ICD-10-CM | POA: Diagnosis not present

## 2022-12-07 DIAGNOSIS — G35 Multiple sclerosis: Secondary | ICD-10-CM | POA: Diagnosis not present

## 2022-12-07 DIAGNOSIS — R32 Unspecified urinary incontinence: Secondary | ICD-10-CM | POA: Diagnosis not present

## 2022-12-08 ENCOUNTER — Encounter (INDEPENDENT_AMBULATORY_CARE_PROVIDER_SITE_OTHER): Payer: Medicare HMO | Admitting: Family Medicine

## 2022-12-08 ENCOUNTER — Other Ambulatory Visit: Payer: Self-pay

## 2022-12-08 ENCOUNTER — Encounter (INDEPENDENT_AMBULATORY_CARE_PROVIDER_SITE_OTHER): Payer: Self-pay

## 2022-12-08 DIAGNOSIS — G35 Multiple sclerosis: Secondary | ICD-10-CM | POA: Diagnosis not present

## 2022-12-08 MED ORDER — SODIUM CHLORIDE 0.9 % IV SOLN: 600.0000 mg | Freq: Once | INTRAVENOUS | Status: AC

## 2022-12-08 MED ORDER — DIPHENHYDRAMINE HCL 50 MG/ML IJ SOLN: 50.0000 mg | Freq: Once | INTRAMUSCULAR | Status: AC

## 2022-12-08 MED ORDER — SODIUM CHLORIDE 0.9 % IV SOLN: 20.0000 mg | Freq: Once | INTRAVENOUS | Status: AC

## 2022-12-08 MED ORDER — SODIUM CHLORIDE 0.9 % IV SOLN: 125.0000 mg | Freq: Once | INTRAVENOUS | Status: AC

## 2022-12-08 MED ORDER — ACETAMINOPHEN 325 MG PO TABS: 650.0000 mg | ORAL_TABLET | Freq: Once | ORAL | Status: DC

## 2022-12-09 ENCOUNTER — Other Ambulatory Visit: Payer: Self-pay | Admitting: Neurology

## 2022-12-09 DIAGNOSIS — G35 Multiple sclerosis: Secondary | ICD-10-CM | POA: Diagnosis not present

## 2022-12-10 DIAGNOSIS — G35 Multiple sclerosis: Secondary | ICD-10-CM | POA: Diagnosis not present

## 2022-12-11 ENCOUNTER — Non-Acute Institutional Stay (HOSPITAL_COMMUNITY)
Admission: RE | Admit: 2022-12-11 | Discharge: 2022-12-11 | Disposition: A | Discharge status: Home / Self Care | Payer: Medicare HMO | Source: Ambulatory Visit | Attending: Internal Medicine | Admitting: Internal Medicine

## 2022-12-11 DIAGNOSIS — G35 Multiple sclerosis: Secondary | ICD-10-CM | POA: Insufficient documentation

## 2022-12-11 MED ORDER — ACETAMINOPHEN 325 MG PO TABS
650.0000 mg | ORAL_TABLET | Freq: Once | ORAL | Status: AC
  Administered 2022-12-11: 650 mg via ORAL
  Filled 2022-12-11: qty 2

## 2022-12-11 MED ORDER — METHYLPREDNISOLONE SODIUM SUCC 125 MG IJ SOLR
125.0000 mg | Freq: Once | INTRAMUSCULAR | Status: AC
  Administered 2022-12-11: 125 mg via INTRAVENOUS
  Filled 2022-12-11: qty 2

## 2022-12-11 MED ORDER — SODIUM CHLORIDE 0.9 % IV SOLN
600.0000 mg | Freq: Once | INTRAVENOUS | Status: AC
  Administered 2022-12-11: 600 mg via INTRAVENOUS
  Filled 2022-12-11: qty 20

## 2022-12-11 MED ORDER — FAMOTIDINE IN NACL 20-0.9 MG/50ML-% IV SOLN
20.0000 mg | Freq: Once | INTRAVENOUS | Status: AC
  Administered 2022-12-11: 20 mg via INTRAVENOUS
  Filled 2022-12-11: qty 50

## 2022-12-11 MED ORDER — SODIUM CHLORIDE 0.9 % IV SOLN: INTRAVENOUS | Status: DC | PRN

## 2022-12-11 MED ORDER — DIPHENHYDRAMINE HCL 50 MG/ML IJ SOLN
50.0000 mg | Freq: Once | INTRAMUSCULAR | Status: AC
  Administered 2022-12-11: 50 mg via INTRAVENOUS
  Filled 2022-12-11: qty 1

## 2022-12-11 NOTE — Progress Notes (Signed)
PATIENT CARE CENTER NOTE  Diagnosis: Multiple sclerosis ([place]) [G35]    Provider: Penni Bombard, MD   Procedure: Ocrevus 600 mg  Note: Patient received Ocrevus 600 mg infusion (dose #1 of 1) via PIV. Pre-medications given per orders. Infusion titrated per protocol.  Tolerated infusion well with no adverse reaction. Vital signs stable. AVS offered, but pt declined. Patient declined to stay for the 1 hour post infusion observation. Patient advised to schedule next appointment at front desk. Pt  Alert, oriented and in motorized wheelchair at discharge.

## 2022-12-14 DIAGNOSIS — G35 Multiple sclerosis: Secondary | ICD-10-CM | POA: Diagnosis not present

## 2022-12-15 DIAGNOSIS — G35 Multiple sclerosis: Secondary | ICD-10-CM | POA: Diagnosis not present

## 2022-12-16 DIAGNOSIS — R030 Elevated blood-pressure reading, without diagnosis of hypertension: Secondary | ICD-10-CM | POA: Diagnosis not present

## 2022-12-16 DIAGNOSIS — Z008 Encounter for other general examination: Secondary | ICD-10-CM | POA: Diagnosis not present

## 2022-12-16 DIAGNOSIS — G629 Polyneuropathy, unspecified: Secondary | ICD-10-CM | POA: Diagnosis not present

## 2022-12-16 DIAGNOSIS — K219 Gastro-esophageal reflux disease without esophagitis: Secondary | ICD-10-CM | POA: Diagnosis not present

## 2022-12-16 DIAGNOSIS — G35 Multiple sclerosis: Secondary | ICD-10-CM | POA: Diagnosis not present

## 2022-12-16 DIAGNOSIS — R69 Illness, unspecified: Secondary | ICD-10-CM | POA: Diagnosis not present

## 2022-12-16 DIAGNOSIS — R32 Unspecified urinary incontinence: Secondary | ICD-10-CM | POA: Diagnosis not present

## 2022-12-16 DIAGNOSIS — K5900 Constipation, unspecified: Secondary | ICD-10-CM | POA: Diagnosis not present

## 2022-12-16 DIAGNOSIS — R2681 Unsteadiness on feet: Secondary | ICD-10-CM | POA: Diagnosis not present

## 2022-12-16 DIAGNOSIS — R2689 Other abnormalities of gait and mobility: Secondary | ICD-10-CM | POA: Diagnosis not present

## 2022-12-16 DIAGNOSIS — R600 Localized edema: Secondary | ICD-10-CM | POA: Diagnosis not present

## 2022-12-16 DIAGNOSIS — I739 Peripheral vascular disease, unspecified: Secondary | ICD-10-CM | POA: Diagnosis not present

## 2022-12-17 ENCOUNTER — Other Ambulatory Visit (HOSPITAL_COMMUNITY)
Admission: RE | Admit: 2022-12-17 | Discharge: 2022-12-17 | Disposition: A | Discharge status: Home / Self Care | Payer: Medicare HMO | Source: Ambulatory Visit | Attending: Obstetrics | Admitting: Obstetrics

## 2022-12-17 ENCOUNTER — Encounter: Payer: Self-pay | Admitting: Obstetrics

## 2022-12-17 ENCOUNTER — Ambulatory Visit (INDEPENDENT_AMBULATORY_CARE_PROVIDER_SITE_OTHER): Payer: Medicare HMO | Admitting: Obstetrics

## 2022-12-17 VITALS — BP 136/76 | HR 72 | Ht 68.0 in | Wt 308.0 lb

## 2022-12-17 DIAGNOSIS — Z01419 Encounter for gynecological examination (general) (routine) without abnormal findings: Secondary | ICD-10-CM

## 2022-12-17 DIAGNOSIS — N939 Abnormal uterine and vaginal bleeding, unspecified: Secondary | ICD-10-CM | POA: Insufficient documentation

## 2022-12-17 DIAGNOSIS — N393 Stress incontinence (female) (male): Secondary | ICD-10-CM

## 2022-12-17 DIAGNOSIS — Z6841 Body Mass Index (BMI) 40.0 and over, adult: Secondary | ICD-10-CM

## 2022-12-17 DIAGNOSIS — N898 Other specified noninflammatory disorders of vagina: Secondary | ICD-10-CM | POA: Insufficient documentation

## 2022-12-17 DIAGNOSIS — Z1151 Encounter for screening for human papillomavirus (HPV): Secondary | ICD-10-CM | POA: Diagnosis not present

## 2022-12-17 DIAGNOSIS — E66813 Obesity, class 3: Secondary | ICD-10-CM

## 2022-12-17 DIAGNOSIS — Z113 Encounter for screening for infections with a predominantly sexual mode of transmission: Secondary | ICD-10-CM | POA: Insufficient documentation

## 2022-12-17 DIAGNOSIS — G35 Multiple sclerosis: Secondary | ICD-10-CM | POA: Diagnosis not present

## 2022-12-17 DIAGNOSIS — R69 Illness, unspecified: Secondary | ICD-10-CM | POA: Diagnosis not present

## 2022-12-17 NOTE — Progress Notes (Addendum)
Subjective:        Ebony Warren is a 47 y.o. female here for a routine exam.  Current complaints: Complains of prolonged 19 day period in November 2023, then had onset of vaginal bleeding again 2 weeks later.  She had normal 4 day periods prior to this AUB.  Her mother recently underwent a hysterectomy for endometrial CA.  Personal health questionnaire:  Is patient Ashkenazi Jewish, have a family history of breast and/or ovarian cancer: no Is there a family history of uterine cancer diagnosed at age < 73, gastrointestinal cancer, urinary tract cancer, family member who is a Field seismologist syndrome-associated carrier: yes Is the patient overweight and hypertensive, family history of diabetes, personal history of gestational diabetes, preeclampsia or PCOS: yes Is patient over 82, have PCOS,  family history of premature CHD under age 29, diabetes, smoke, have hypertension or peripheral artery disease:  no At any time, has a partner hit, kicked or otherwise hurt or frightened you?: no Over the past 2 weeks, have you felt down, depressed or hopeless?: no Over the past 2 weeks, have you felt little interest or pleasure in doing things?:no   Gynecologic History Patient's last menstrual period was 12/05/2022 (exact date). Contraception: abstinence Last Pap: 2022. Results were: normal Last mammogram: 2023. Results were: normal  Obstetric History OB History  No obstetric history on file.    Past Medical History:  Diagnosis Date   Anxiety and depression    Arthritis    Bradycardia    Encounter to establish care 01/22/2021   Family history of adverse reaction to anesthesia    nausea   GERD (gastroesophageal reflux disease)    Headache    Multiple sclerosis ([place])    Screening mammogram, encounter for 01/22/2021   Seizures ([place])    told that they are not from the brain but stress related    Past Surgical History:  Procedure Laterality Date   BREAST REDUCTION SURGERY Bilateral  06/23/2021   Procedure: MAMMARY REDUCTION  (BREAST);  Surgeon: Cindra Presume, MD;  Location: [facility];  Service: Plastics;  Laterality: Bilateral;   CHOLECYSTECTOMY     KNEE SURGERY Bilateral    Arthroscopy     Current Outpatient Medications:    ARIPiprazole (ABILIFY) 5 MG tablet, Take 5 mg by mouth daily., Disp: , Rfl:    baclofen (LIORESAL) 20 MG tablet, Take 1 tablet (20 mg total) by mouth 3 (three) times daily., Disp: 270 tablet, Rfl: 4   cholecalciferol (VITAMIN D) 1000 UNITS tablet, Take 1,000 Units by mouth daily., Disp: , Rfl:    diphenhydrAMINE (BENADRYL) 25 mg capsule, Take 25 mg by mouth every 6 (six) hours as needed for allergies., Disp: , Rfl:    DULoxetine (CYMBALTA) 30 MG capsule, Take 1 capsule by mouth daily., Disp: , Rfl:    furosemide (LASIX) 20 MG tablet, Take 1 tablet (20 mg total) by mouth daily as needed., Disp: 90 tablet, Rfl: 3   gabapentin (NEURONTIN) 300 MG capsule, Take 2 capsules (600 mg total) by mouth 3 (three) times daily., Disp: 540 capsule, Rfl: 4   LORazepam (ATIVAN) 1 MG tablet, Take 1 mg by mouth daily., Disp: , Rfl:    LORazepam (ATIVAN) 1 MG tablet, Take 1 tablet (1 mg total) by mouth daily., Disp: 14 tablet, Rfl: 0   LORazepam (ATIVAN) 1 MG tablet, Take 1 tablet by mouth daily., Disp: , Rfl:    Multiple Vitamins-Minerals (MULTIVITAMIN ADULT PO), Take 1 tablet by mouth daily., Disp: ,  Rfl:    ocrelizumab (OCREVUS) 300 MG/10ML injection, Infuse 600 mg IV Every 6 months, Disp: 20 mL, Rfl: 1   oxybutynin (DITROPAN-XL) 10 MG 24 hr tablet, Take 1 tablet (10 mg total) by mouth daily as needed (to prevent incontinence)., Disp: 90 tablet, Rfl: 3   pantoprazole (PROTONIX) 40 MG tablet, Take 1 tablet (40 mg total) by mouth daily., Disp: 90 tablet, Rfl: 3   Semaglutide,0.25 or 0.5MG /DOS, 2 MG/3ML SOPN, Inject 0.25 mg into the skin once a week., Disp: 3 mL, Rfl: 3   tiZANidine (ZANAFLEX) 4 MG tablet, Take 1 tablet (4 mg total) by mouth 3 (three) times daily., Disp: 270  tablet, Rfl: 4  Current Facility-Administered Medications:    acetaminophen (TYLENOL) tablet 650 mg, 650 mg, Oral, Once, Penumalli, Vikram R, MD   diphenhydrAMINE (BENADRYL) injection 50 mg, 50 mg, Intravenous, Once, Penumalli, Vikram R, MD   famotidine (PEPCID) 20 mg in sodium chloride 0.9 % 50 mL IVPB, 20 mg, Intravenous, Once, Penumalli, Vikram R, MD   methylPREDNISolone sodium succinate (SOLU-MEDROL) 125 mg in sodium chloride 0.9 % 50 mL IVPB, 125 mg, Intravenous, Once, Penumalli, Vikram R, MD   ocrelizumab (OCREVUS) 600 mg in sodium chloride 0.9 % 250 mL, 600 mg, Intravenous, Once, Penumalli, Vikram R, MD Allergies  Allergen Reactions   Bioflavonoids Hives   Oxycodone-Acetaminophen Itching   Hydrocodone Itching    Stomach cramps   Morphine Itching   Orange Fruit [Citrus]     Acidic foods Makes her itchy and her face "feels like its on fire", have rash.   Oxycodone Itching    rash   Tomato Hives   Other Rash    Narcotics. Spicy foods (cause seizures)    Social History   Tobacco Use   Smoking status: Former    Packs/day: 0.35    Years: 3.00    Total pack years: 1.05    Types: Cigarettes    Quit date: 12/07/2006    Years since quitting: 16.0   Smokeless tobacco: Never  Substance Use Topics   Alcohol use: No    Alcohol/week: 0.0 standard drinks of alcohol    Comment: quit: 2013 (socially)    Family History  Problem Relation Age of Onset   Hypertension Mother    Atrial fibrillation Mother    Hypertension Father    Atrial fibrillation Maternal Grandmother       Review of Systems  Constitutional: negative for fatigue and weight loss Respiratory: negative for cough and wheezing Cardiovascular: negative for chest pain, fatigue and palpitations Gastrointestinal: negative for abdominal pain and change in bowel habits Musculoskeletal:negative for myalgias Neurological: negative for gait problems and tremors Behavioral/Psych: negative for abusive relationship,  depression Endocrine: negative for temperature intolerance    Genitourinary: positive for abnormal periods, vaginal discharge and urinary incontinence.  negative for genital lesions, hot flashes, sexual problems  Integument/breast: negative for breast lump, breast tenderness, nipple discharge and skin lesion(s)    Objective:       BP 136/76   Pulse 72   Ht 5\' 8"  (1.727 m)   Wt (!) 308 lb (139.7 kg)   LMP 12/05/2022 (Exact Date)   BMI 46.83 kg/m  General:   Alert and no distress  Skin:   no rash or abnormalities  Lungs:   clear to auscultation bilaterally  Heart:   regular rate and rhythm, S1, S2 normal, no murmur, click, rub or gallop  Breasts:   normal without suspicious masses, skin or nipple changes or axillary nodes  Abdomen:  normal findings: no organomegaly, soft, non-tender and no hernia  Pelvis:  External genitalia: normal general appearance Urinary system: urethral meatus normal and bladder without fullness, nontender Vaginal: normal without tenderness, induration or masses Cervix: normal appearance Adnexa: normal bimanual exam Uterus: anteverted and non-tender, normal size   Lab Review Urine pregnancy test Labs reviewed yes Radiologic studies reviewed yes  I have spent a total of 20 minutes of face-to-face time, excluding clinical staff time, reviewing notes and preparing to see patient, ordering tests and/or medications, and counseling the patient.   Assessment:     1. Encounter for gynecological examination with Papanicolaou smear of cervix Rx: - Cytology - PAP( [facility])  2. Abnormal uterine bleeding (AUB) Rx: - Surgical pathology  3. Vaginal discharge Rx: - Cervicovaginal ancillary only( [place])  4. SUI (stress urinary incontinence, female)  5. Class 3 severe obesity due to excess calories without serious comorbidity with body mass index (BMI) of 45.0 to 49.9 in adult [facility])    Plan:    Education reviewed: calcium supplements, depression  evaluation, low fat, low cholesterol diet, safe sex/STD prevention, self breast exams, and weight bearing exercise. Follow up in: 2 weeks.     Hallelujah Wysong A. Clearance Coots MD 12/17/2022    Endometrial Biopsy Procedure Note  Pre-operative Diagnosis: Abnormal Uterine Bleeding ( AUB )  Post-operative Diagnosis: same  Indications: abnormal uterine bleeding  Procedure Details   Urine pregnancy test was not done.  The risks (including infection, bleeding, pain, and uterine perforation) and benefits of the procedure were explained to the patient and Written informed consent was obtained.    The patient was placed in the dorsal lithotomy position.  Bimanual exam showed the uterus to be in the neutral position.  A Graves' speculum inserted in the vagina, and the cervix prepped with povidone iodine.  Endocervical curettage with a Kevorkian curette was not performed.   A sharp tenaculum was applied to the anterior lip of the cervix for stabilization.  A sterile uterine sound was used to sound the uterus to a depth of 6cm.  A Pipelle endometrial aspirator was used to sample the endometrium.  Sample was sent for pathologic examination.  Condition: Stable  Complications: None  Plan: Follow up in 2 weeks  The patient was advised to call for any fever or for prolonged or severe pain or bleeding. She was advised to use NSAID as needed for mild to moderate pain. She was advised to avoid vaginal intercourse for 48 hours or until the bleeding has completely stopped.  Attending Physician Documentation: I was present for or participated in the entire procedure, including opening and closing.  Noach Calvillo A. Clearance Coots MD `12/17/2022

## 2022-12-17 NOTE — Progress Notes (Signed)
47 y.o GYN presents for AEX/PAP.  C/o bleeding for 19 days 10/27-11/14/23, cramps, blood clots, chills.

## 2022-12-18 DIAGNOSIS — G35 Multiple sclerosis: Secondary | ICD-10-CM | POA: Diagnosis not present

## 2022-12-18 LAB — CERVICOVAGINAL ANCILLARY ONLY
Bacterial Vaginitis (gardnerella): NEGATIVE
Candida Glabrata: NEGATIVE
Candida Vaginitis: NEGATIVE
Chlamydia: NEGATIVE
Comment: NEGATIVE
Comment: NEGATIVE
Comment: NEGATIVE
Comment: NEGATIVE
Comment: NEGATIVE
Comment: NORMAL
Neisseria Gonorrhea: NEGATIVE
Trichomonas: NEGATIVE

## 2022-12-21 LAB — CYTOLOGY - PAP
Comment: NEGATIVE
Diagnosis: NEGATIVE
High risk HPV: NEGATIVE

## 2022-12-21 LAB — SURGICAL PATHOLOGY

## 2022-12-22 DIAGNOSIS — G35 Multiple sclerosis: Secondary | ICD-10-CM | POA: Diagnosis not present

## 2022-12-22 NOTE — Progress Notes (Signed)
[facility]: Clinic Note  Subjective:  History of Present Illness: Ebony Warren is a 47 y.o. year old female who presents for routine follow up.  Her one concern today is intermittent dry cough, sometimes associated with nasal congestion. No chest pain or shortness of breath. No fevers. She's not on ACE. She has GERD, but does not think cough is tied to GERD symptoms, which are currently well controlled. No wheezing, and not worse with cold or exercise. She thinks it's allergy related and I agree. Has done Benadryl prn for allergies.  For her obesity, the GLP1 was not covered by insurance, nor was a visit with But[facility] Penny because she doesn't have diabetes. She is meeting with a nutritionist next month though and has joined [employer] at [employer].   Her blood pressure has been high at last few doctor visits, 865H systolic. We discussed starting Losartan.   She has follow up with Neuro for her MS in March. 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  She turned 46yo since our last visit, and is due for colonoscopy, which we discussed.      Please refer to Assessment and Plan below for full details in Problem-Based Charting.   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   Other fatigue 01/27/2016   Depression 01/27/2016   Absence of bladder continence 01/27/2016   Multiple sclerosis ([place]) 10/20/2014   Hemiplegia of dominant side ([place]) 08/30/2014   Mixed, or nondependent drug abuse 03/05/2014    Migraines 09/20/2012   Seizures ([place]) 09/19/2012     Medications:  Current Outpatient Medications:    losartan (COZAAR) 25 MG tablet, Take 1 tablet (25 mg total) by mouth daily., Disp: 90 tablet, Rfl: 3   ARIPiprazole (ABILIFY) 5 MG tablet, Take 5 mg by mouth daily., Disp: , Rfl:    baclofen (LIORESAL) 20 MG tablet, Take 1 tablet (20 mg total) by mouth 3 (three) times daily., Disp: 270 tablet, Rfl: 4   cholecalciferol (VITAMIN D) 1000 UNITS tablet, Take 1,000 Units by mouth daily., Disp: , Rfl:    diphenhydrAMINE (BENADRYL) 25 mg capsule, Take 25 mg by mouth every 6 (six) hours as needed for allergies., Disp: , Rfl:    DULoxetine (CYMBALTA) 30 MG capsule, Take 1 capsule by mouth daily., Disp: , Rfl:    furosemide (LASIX) 20 MG tablet, Take 1 tablet (20 mg total) by mouth daily as needed., Disp: 90 tablet, Rfl: 3   gabapentin (NEURONTIN) 300 MG capsule, Take 2 capsules (600 mg total) by mouth 3 (three) times daily., Disp: 540 capsule, Rfl: 4   LORazepam (ATIVAN) 1 MG tablet, Take 1 mg by mouth daily., Disp: , Rfl:    LORazepam (ATIVAN) 1 MG tablet, Take 1 tablet (1 mg total) by mouth daily., Disp: 14 tablet, Rfl: 0   LORazepam (ATIVAN) 1 MG tablet, Take 1 tablet by mouth daily., Disp: , Rfl:    Multiple Vitamins-Minerals (MULTIVITAMIN ADULT PO), Take 1 tablet by mouth daily., Disp: , Rfl:    ocrelizumab (OCREVUS) 300 MG/10ML injection,  Infuse 600 mg IV Every 6 months, Disp: 20 mL, Rfl: 1   oxybutynin (DITROPAN-XL) 10 MG 24 hr tablet, Take 1 tablet (10 mg total) by mouth daily as needed (to prevent incontinence)., Disp: 90 tablet, Rfl: 3   pantoprazole (PROTONIX) 40 MG tablet, Take 1 tablet (40 mg total) by mouth daily., Disp: 90 tablet, Rfl: 3   Semaglutide,0.25 or 0.5MG /DOS, 2 MG/3ML SOPN, Inject 0.25 mg into the skin once a week., Disp: 3 mL, Rfl: 3   tiZANidine (ZANAFLEX) 4 MG tablet, Take 1 tablet (4 mg total) by mouth 3 (three) times daily., Disp: 270 tablet, Rfl: 4  Current  Facility-Administered Medications:    acetaminophen (TYLENOL) tablet 650 mg, 650 mg, Oral, Once, Penumalli, Vikram R, MD   diphenhydrAMINE (BENADRYL) injection 50 mg, 50 mg, Intravenous, Once, Penumalli, Vikram R, MD   famotidine (PEPCID) 20 mg in sodium chloride 0.9 % 50 mL IVPB, 20 mg, Intravenous, Once, Penumalli, Vikram R, MD   methylPREDNISolone sodium succinate (SOLU-MEDROL) 125 mg in sodium chloride 0.9 % 50 mL IVPB, 125 mg, Intravenous, Once, Penumalli, Vikram R, MD   ocrelizumab (OCREVUS) 600 mg in sodium chloride 0.9 % 250 mL, 600 mg, Intravenous, Once, Penumalli, Earlean Polka, MD   Allergies: Allergies  Allergen Reactions   Bioflavonoids Hives   Oxycodone-Acetaminophen Itching   Hydrocodone Itching    Stomach cramps   Morphine Itching   Orange Fruit [Citrus]     Acidic foods Makes her itchy and her face "feels like its on fire", have rash.   Oxycodone Itching    rash   Tomato Hives   Other Rash    Narcotics. Spicy foods (cause seizures)   Objective:   Vitals: Vitals:   12/23/22 0929  BP: (!) 145/97  Pulse: 65  Temp: 98.9 F (37.2 C)  SpO2: 100%     Physical Exam: Physical Exam Constitutional:      Appearance: Normal appearance. She is obese.  Cardiovascular:     Rate and Rhythm: Normal rate and regular rhythm.     Heart sounds: No murmur heard. Pulmonary:     Effort: Pulmonary effort is normal.     Breath sounds: Normal breath sounds. No wheezing.  Neurological:     Mental Status: She is alert.      Data: Labs, imaging, and micro were reviewed in Epic. Refer to Assessment and Plan below for full details in Problem-Based Charting.  Assessment & Plan:  Cough - Trial of OTC Zyrtec daily when cough is bothersome.  - Trial of OTC fluticasone prn nasal congestion - I think cough is related to mild allergies  Essential hypertension - this is a new diagnosis - Start Losartan 25mg  daily, with plans to repeat BP in 4 weeks with BMP, and increase to 50mg   daily if still high   Healthcare maintenance - Colonoscopy referral placed today - Patient should not get a flu shot on her current biologic, per neuro   Morbid obesity ([place]) - Follow up with nutritionist next month - Referral to [facility] program placed last visit, patient expecting a call - continue lifestyle changes  Multiple sclerosis ([place]) - Managed by neurology. No changes made today, she has follow-up with them in march      Patient will follow up in 1 month. Please repeat blood pressure check and get BMP at that visit.

## 2022-12-23 ENCOUNTER — Ambulatory Visit (INDEPENDENT_AMBULATORY_CARE_PROVIDER_SITE_OTHER): Payer: Medicare HMO | Admitting: Internal Medicine

## 2022-12-23 ENCOUNTER — Encounter: Payer: Self-pay | Admitting: Internal Medicine

## 2022-12-23 VITALS — BP 153/91 | HR 65 | Temp 98.9°F | Ht 68.0 in | Wt 307.1 lb

## 2022-12-23 DIAGNOSIS — R059 Cough, unspecified: Secondary | ICD-10-CM | POA: Insufficient documentation

## 2022-12-23 DIAGNOSIS — R053 Chronic cough: Secondary | ICD-10-CM | POA: Diagnosis not present

## 2022-12-23 DIAGNOSIS — I10 Essential (primary) hypertension: Secondary | ICD-10-CM | POA: Diagnosis not present

## 2022-12-23 DIAGNOSIS — G35 Multiple sclerosis: Secondary | ICD-10-CM | POA: Diagnosis not present

## 2022-12-23 DIAGNOSIS — Z0000 Encounter for general adult medical examination without abnormal findings: Secondary | ICD-10-CM

## 2022-12-23 DIAGNOSIS — Z87891 Personal history of nicotine dependence: Secondary | ICD-10-CM

## 2022-12-23 DIAGNOSIS — Z1211 Encounter for screening for malignant neoplasm of colon: Secondary | ICD-10-CM

## 2022-12-23 MED ORDER — LOSARTAN POTASSIUM 25 MG PO TABS: 25.0000 mg | ORAL_TABLET | Freq: Every day | ORAL | 3 refills | Status: DC

## 2022-12-23 NOTE — Assessment & Plan Note (Signed)
-  this is a new diagnosis - Start Losartan 25mg  daily, with plans to repeat BP in 4 weeks with BMP, and increase to 50mg  daily if still high

## 2022-12-23 NOTE — Assessment & Plan Note (Signed)
-  Trial of OTC Zyrtec daily when cough is bothersome.  - Trial of OTC fluticasone prn nasal congestion - I think cough is related to mild allergies

## 2022-12-23 NOTE — Assessment & Plan Note (Signed)
-  Managed by neurology. No changes made today, she has follow-up with them in march

## 2022-12-23 NOTE — Patient Instructions (Signed)
We are starting a new medicine for your high blood pressure today called Losartan. Take 1 pill (25mg ) once daily, and we will see you back in 1 month to repeat your blood pressure and to check your labs.   I have referred you for a colonoscopy. The GI office will call you to schedule this.  For your cough, I think it is from allergies. When it's bothersome, take 1 non drowsy allergy medicine (I recommend Claritin or Zyrtec or Allegra) once daily until the cough is better. If you have nose congestion, you should also use over the counter Flonase - 2 puffs in each nostril 1-2 times daily.  I'll see you back in 1 month.

## 2022-12-23 NOTE — Assessment & Plan Note (Signed)
-  Follow up with nutritionist next month - Referral to [facility] program placed last visit, patient expecting a call - continue lifestyle changes

## 2022-12-23 NOTE — Assessment & Plan Note (Signed)
-  Colonoscopy referral placed today - Patient should not get a flu shot on her current biologic, per neuro

## 2022-12-24 DIAGNOSIS — G35 Multiple sclerosis: Secondary | ICD-10-CM | POA: Diagnosis not present

## 2022-12-25 DIAGNOSIS — G35 Multiple sclerosis: Secondary | ICD-10-CM | POA: Diagnosis not present

## 2022-12-28 DIAGNOSIS — G35 Multiple sclerosis: Secondary | ICD-10-CM | POA: Diagnosis not present

## 2022-12-29 DIAGNOSIS — G35 Multiple sclerosis: Secondary | ICD-10-CM | POA: Diagnosis not present

## 2022-12-30 ENCOUNTER — Encounter: Payer: Self-pay | Admitting: Gastroenterology

## 2022-12-30 DIAGNOSIS — G35 Multiple sclerosis: Secondary | ICD-10-CM | POA: Diagnosis not present

## 2022-12-31 ENCOUNTER — Telehealth (INDEPENDENT_AMBULATORY_CARE_PROVIDER_SITE_OTHER): Payer: Medicare HMO | Admitting: Obstetrics

## 2022-12-31 DIAGNOSIS — G35 Multiple sclerosis: Secondary | ICD-10-CM | POA: Diagnosis not present

## 2022-12-31 DIAGNOSIS — Z6841 Body Mass Index (BMI) 40.0 and over, adult: Secondary | ICD-10-CM

## 2022-12-31 DIAGNOSIS — N939 Abnormal uterine and vaginal bleeding, unspecified: Secondary | ICD-10-CM | POA: Diagnosis not present

## 2022-12-31 MED ORDER — MEGESTROL ACETATE 40 MG PO TABS: 40.0000 mg | ORAL_TABLET | Freq: Two times a day (BID) | ORAL | 0 refills | Status: DC

## 2022-12-31 NOTE — Progress Notes (Signed)
GYNECOLOGY VIRTUAL VISIT ENCOUNTER NOTE  Provider location: [facility] [place] [facility]   Patient location: Home  I connected with Ebony Warren on 12/31/22 at  1:50 PM EST by MyChart Video Encounter and verified that I am speaking with the correct person using two identifiers.   I discussed the limitations, risks, security and privacy concerns of performing an evaluation and management service virtually and the availability of in person appointments. I also discussed with the patient that there may be a patient responsible charge related to this service. The patient expressed understanding and agreed to proceed.   History:  Ebony Warren is a 47 y.o. No obstetric history on file. female being evaluated today for follow up for Endometrial Biopsy results for AUB.  She denies any abnormal vaginal discharge, pelvic pain or other concerns.       Past Medical History:  Diagnosis Date   Anxiety and depression    Arthritis    Bradycardia    Encounter to establish care 01/22/2021   Family history of adverse reaction to anesthesia    nausea   GERD (gastroesophageal reflux disease)    Headache    Multiple sclerosis ([place])    Screening mammogram, encounter for 01/22/2021   Seizures ([place])    told that they are not from the brain but stress related   Past Surgical History:  Procedure Laterality Date   BREAST REDUCTION SURGERY Bilateral 06/23/2021   Procedure: MAMMARY REDUCTION  (BREAST);  Surgeon: Cindra Presume, MD;  Location: [facility];  Service: Plastics;  Laterality: Bilateral;   CHOLECYSTECTOMY     KNEE SURGERY Bilateral    Arthroscopy   The following portions of the patient's history were reviewed and updated as appropriate: allergies, current medications, past family history, past medical history, past social history, past surgical history and problem list.   Health Maintenance:  Normal pap and negative HRHPV on 12/17/2022.  Abnormal mammogram  in 2022.   Review of Systems:  Pertinent items noted in HPI and remainder of comprehensive ROS otherwise negative.  Physical Exam:   General:  Alert, oriented and cooperative. Patient appears to be in no acute distress.  Mental Status: Normal mood and affect. Normal behavior. Normal judgment and thought content.   Respiratory: Normal respiratory effort, no problems with respiration noted  Rest of physical exam deferred due to type of encounter  Labs and Imaging No results found for this or any previous visit (from the past 336 hour(s)). No results found.     Assessment and Plan:     1. Abnormal uterine bleeding (AUB) Rx: - megestrol (MEGACE) 40 MG tablet; Take 1 tablet (40 mg total) by mouth 2 (two) times daily.  Dispense: 60 tablet; Refill: 0 - US PELVIC COMPLETE WITH TRANSVAGINAL; Future  2. Class 3 severe obesity due to excess calories without serious comorbidity with body mass index (BMI) of 45.0 to 49.9 in adult [facility])        I discussed the assessment and treatment plan with the patient. The patient was provided an opportunity to ask questions and all were answered. The patient agreed with the plan and demonstrated an understanding of the instructions.   The patient was advised to call back or seek an in-person evaluation/go to the ED if the symptoms worsen or if the condition fails to improve as anticipated.  I have spent a total of 10 minutes non-face-to-face time, excluding clinical staff time, reviewing notes and preparing to see patient, ordering  tests and/or medications, and counseling the patient.    Baltazar Najjar, MD [facility] for [facility], [place], [facility] 12/31/2022

## 2023-01-01 DIAGNOSIS — G35 Multiple sclerosis: Secondary | ICD-10-CM | POA: Diagnosis not present

## 2023-01-07 DIAGNOSIS — F411 Generalized anxiety disorder: Secondary | ICD-10-CM | POA: Diagnosis not present

## 2023-01-07 DIAGNOSIS — Z79899 Other long term (current) drug therapy: Secondary | ICD-10-CM | POA: Diagnosis not present

## 2023-01-07 DIAGNOSIS — F329 Major depressive disorder, single episode, unspecified: Secondary | ICD-10-CM | POA: Diagnosis not present

## 2023-01-07 DIAGNOSIS — R69 Illness, unspecified: Secondary | ICD-10-CM | POA: Diagnosis not present

## 2023-01-08 ENCOUNTER — Telehealth: Payer: Self-pay

## 2023-01-08 NOTE — Telephone Encounter (Signed)
Call to pt reference PREP classes starting in March.  Can do MW 1pm-215p starting on 02/08/23 at [facility].  Will complete intake closer to start of class.  Patient has number for contact.

## 2023-01-11 ENCOUNTER — Ambulatory Visit (HOSPITAL_COMMUNITY)
Admission: RE | Admit: 2023-01-11 | Discharge: 2023-01-11 | Disposition: A | Discharge status: Home / Self Care | Payer: Medicare HMO | Source: Ambulatory Visit | Attending: Obstetrics | Admitting: Obstetrics

## 2023-01-11 DIAGNOSIS — N939 Abnormal uterine and vaginal bleeding, unspecified: Secondary | ICD-10-CM | POA: Diagnosis not present

## 2023-01-11 DIAGNOSIS — D259 Leiomyoma of uterus, unspecified: Secondary | ICD-10-CM | POA: Diagnosis not present

## 2023-01-13 DIAGNOSIS — H52203 Unspecified astigmatism, bilateral: Secondary | ICD-10-CM | POA: Diagnosis not present

## 2023-01-13 DIAGNOSIS — H47293 Other optic atrophy, bilateral: Secondary | ICD-10-CM | POA: Diagnosis not present

## 2023-01-13 DIAGNOSIS — H40023 Open angle with borderline findings, high risk, bilateral: Secondary | ICD-10-CM | POA: Diagnosis not present

## 2023-01-13 DIAGNOSIS — H5213 Myopia, bilateral: Secondary | ICD-10-CM | POA: Diagnosis not present

## 2023-01-13 DIAGNOSIS — G35 Multiple sclerosis: Secondary | ICD-10-CM | POA: Diagnosis not present

## 2023-01-14 ENCOUNTER — Ambulatory Visit (INDEPENDENT_AMBULATORY_CARE_PROVIDER_SITE_OTHER): Payer: Medicare HMO | Admitting: Internal Medicine

## 2023-01-14 ENCOUNTER — Encounter (INDEPENDENT_AMBULATORY_CARE_PROVIDER_SITE_OTHER): Payer: Self-pay | Admitting: Internal Medicine

## 2023-01-14 VITALS — BP 137/86 | HR 80 | Temp 97.9°F | Ht 68.0 in | Wt 309.0 lb

## 2023-01-14 DIAGNOSIS — Z6841 Body Mass Index (BMI) 40.0 and over, adult: Secondary | ICD-10-CM | POA: Diagnosis not present

## 2023-01-14 DIAGNOSIS — G35 Multiple sclerosis: Secondary | ICD-10-CM

## 2023-01-14 DIAGNOSIS — Z0289 Encounter for other administrative examinations: Secondary | ICD-10-CM

## 2023-01-14 DIAGNOSIS — I10 Essential (primary) hypertension: Secondary | ICD-10-CM

## 2023-01-14 NOTE — Progress Notes (Signed)
Office: [phone]  /  Fax: [phone]   Initial Visit  Ebony Warren was seen in clinic today to evaluate for obesity. She is interested in losing weight to improve overall health and reduce the risk of weight related complications. She presents today to review program treatment options, initial physical assessment, and evaluation.     She was referred by: Specialist  When asked what else they would like to accomplish? She states: Improve energy levels and physical activity, Improve existing medical conditions, Improve quality of life, Improve appearance, and Improve self-confidence  When asked how has your weight affected you? She states: Has affected self-esteem, Having fatigue, Having poor endurance, and Problems with depression and or anxiety  Some associated conditions: Other: MS  Contributing factors: Family history, Disruption of circadian rhythm, Nutritional, Reduced physical activity, Mental health problems, Life event, and Other: MS  Weight promoting medications identified: Psychotropic medications, Antiepileptics, and Anticholinergics  Current nutrition plan: Other: Journaling 1500-2000 calories  Current level of physical activity: Other: Limited due to MS  Current or previous pharmacotherapy: GLP-1  Response to medication: Other: Tried it for 3 months and lost weight.    Past medical history includes:   Past Medical History:  Diagnosis Date   Anxiety and depression    Arthritis    Bradycardia    Encounter to establish care 01/22/2021   Family history of adverse reaction to anesthesia    nausea   GERD (gastroesophageal reflux disease)    Headache    Multiple sclerosis ([place])    Screening mammogram, encounter for 01/22/2021   Seizures ([place])    told that they are not from the brain but stress related     Objective:   BP 137/86   Pulse 80   Temp 97.9 F (36.6 C)   Ht 5\' 8"  (1.727 m)   Wt (!) 309 lb (140.2 kg)   LMP 12/05/2022 (Exact  Date)   SpO2 99%   BMI 46.98 kg/m  She was weighed on the bioimpedance scale: Body mass index is 46.98 kg/m.  Peak Weight:312   General:  Alert, oriented and cooperative. Patient is in no acute distress.  Respiratory: Normal respiratory effort, no problems with respiration noted  Extremities: Normal range of motion.    Mental Status: Normal mood and affect. Normal behavior. Normal judgment and thought content.   DIAGNOSTIC DATA REVIEWED:  BMET    Component Value Date/Time   NA 138 05/26/2022 1122   K 4.3 05/26/2022 1122   CL 102 05/26/2022 1122   CO2 23 05/26/2022 1122   GLUCOSE 84 05/26/2022 1122   GLUCOSE 85 06/23/2021 1136   BUN 12 05/26/2022 1122   CREATININE 0.75 05/26/2022 1122   CALCIUM 9.0 05/26/2022 1122   GFRNONAA 76 12/10/2020 1251   GFRAA 87 12/10/2020 1251   Lab Results  Component Value Date   HGBA1C 5.3 08/19/2022   No results found for: "INSULIN" CBC    Component Value Date/Time   WBC 6.3 10/27/2022 1056   WBC 7.3 08/30/2014 1031   RBC 4.77 10/27/2022 1056   RBC 4.07 08/30/2014 1031   HGB 14.8 10/27/2022 1056   HCT 45.3 10/27/2022 1056   PLT 279 10/27/2022 1056   MCV 95 10/27/2022 1056   MCH 31.0 10/27/2022 1056   MCH 31.4 08/30/2014 1031   MCHC 32.7 10/27/2022 1056   MCHC 33.9 08/30/2014 1031   RDW 12.1 10/27/2022 1056   Iron/TIBC/Ferritin/ %Sat    Component Value Date/Time   IRON 68 10/27/2022  1056   TIBC 313 10/27/2022 1056   FERRITIN 41 10/27/2022 1056   IRONPCTSAT 22 10/27/2022 1056   Lipid Panel  No results found for: "CHOL", "TRIG", "HDL", "CHOLHDL", "VLDL", "LDLCALC", "LDLDIRECT" Hepatic Function Panel     Component Value Date/Time   PROT 6.7 05/26/2022 1122   ALBUMIN 4.0 05/26/2022 1122   AST 14 05/26/2022 1122   ALT 10 05/26/2022 1122   ALKPHOS 95 05/26/2022 1122   BILITOT 0.5 05/26/2022 1122   No results found for: "TSH"   Assessment and Plan:   Essential hypertension Assessment & Plan: Blood pressure above goal  for age and risk category.  On losartan 25 mg without adverse effects.  Most recent renal parameters reviewed which showed normal electrolytes and kidney function.  Continue with weight loss therapy.  Monitor for symptoms of orthostasis while losing weight. Continue current regimen and home monitoring for a goal blood pressure of 120/80.    Multiple sclerosis ([place]) Assessment & Plan: Symptomatic on disease modifying drugs.  She is also on several anticholinergics and AEDs which may contribute to weight gain.  She needs to be screened for OSA due to hypervigilance in patients with MS.  Losing 10 to 15% of body weight may improve symptoms.   Class 3 severe obesity with serious comorbidity and body mass index (BMI) of 45.0 to 49.9 in adult, unspecified obesity type [facility]) Assessment & Plan: We reviewed weight, biometrics, associated medical conditions and contributing factors with patient. She would benefit from weight loss therapy via a modified calorie, low-carb, high-protein nutritional plan tailored to their REE (resting energy expenditure) which will be determined by indirect calorimetry.  We will also assess for cardiometabolic risk and nutritional derangements via fasting serologies at her next appointment.  Patient had done a 77-month trial of semaglutide and did have a response.  She is interested in pharmacotherapy.        Obesity Treatment / Action Plan:  Patient will work on garnering support from family and friends to begin weight loss journey. Will work on eliminating or reducing the presence of highly palatable, calorie dense foods in the home. Will complete provided nutritional and psychosocial assessment questionnaire before the next appointment. Will be scheduled for indirect calorimetry to determine resting energy expenditure in a fasting state.  This will allow [name] to create a reduced calorie, high-protein meal plan to promote loss of fat mass while preserving muscle  mass. Counseled on the health benefits of losing 5%-15% of total body weight. Was counseled on nutritional approaches to weight loss and benefits of complex carbs and high quality protein as part of nutritional weight management. Was counseled on pharmacotherapy and role as an adjunct in weight management.   Obesity Education Performed Today:  She was weighed on the bioimpedance scale and results were discussed and documented in the synopsis.  We discussed obesity as a disease and the importance of a more detailed evaluation of all the factors contributing to the disease.  We discussed the importance of long term lifestyle changes which include nutrition, exercise and behavioral modifications as well as the importance of customizing this to her specific health and social needs.  We discussed the benefits of reaching a healthier weight to alleviate the symptoms of existing conditions and reduce the risks of the biomechanical, metabolic and psychological effects of obesity.  Ebony Warren appears to be in the action stage of change and states they are ready to start intensive lifestyle modifications and behavioral modifications.  30 minutes  was spent today on this visit including the above counseling, pre-visit chart review, and post-visit documentation.  Reviewed by clinician on day of visit: allergies, medications, problem list, medical history, surgical history, family history, social history, and previous encounter notes.    I have reviewed the above documentation for accuracy and completeness, and I agree with the above. Thomes Dinning, MD

## 2023-01-14 NOTE — Assessment & Plan Note (Addendum)
We reviewed weight, biometrics, associated medical conditions and contributing factors with patient. She would benefit from weight loss therapy via a modified calorie, low-carb, high-protein nutritional plan tailored to their REE (resting energy expenditure) which will be determined by indirect calorimetry.  We will also assess for cardiometabolic risk and nutritional derangements via fasting serologies at her next appointment.  Patient had done a 60-month trial of semaglutide and did have a response.  She is interested in pharmacotherapy.

## 2023-01-14 NOTE — Assessment & Plan Note (Signed)
Symptomatic on disease modifying drugs.  She is also on several anticholinergics and AEDs which may contribute to weight gain.  She needs to be screened for OSA due to hypervigilance in patients with MS.  Losing 10 to 15% of body weight may improve symptoms.

## 2023-01-14 NOTE — Assessment & Plan Note (Addendum)
Blood pressure above goal for age and risk category.  On losartan 25 mg without adverse effects.  Most recent renal parameters reviewed which showed normal electrolytes and kidney function.  Continue with weight loss therapy.  Monitor for symptoms of orthostasis while losing weight. Continue current regimen and home monitoring for a goal blood pressure of 120/80.

## 2023-01-20 ENCOUNTER — Encounter: Payer: Medicare HMO | Admitting: Student

## 2023-01-25 ENCOUNTER — Ambulatory Visit: Payer: Medicare HMO | Admitting: Diagnostic Neuroimaging

## 2023-01-26 DIAGNOSIS — H524 Presbyopia: Secondary | ICD-10-CM | POA: Diagnosis not present

## 2023-01-26 DIAGNOSIS — H52223 Regular astigmatism, bilateral: Secondary | ICD-10-CM | POA: Diagnosis not present

## 2023-01-27 ENCOUNTER — Other Ambulatory Visit: Payer: Self-pay

## 2023-01-27 ENCOUNTER — Encounter: Payer: Self-pay | Admitting: Student

## 2023-01-27 ENCOUNTER — Ambulatory Visit (INDEPENDENT_AMBULATORY_CARE_PROVIDER_SITE_OTHER): Payer: Medicare HMO | Admitting: Student

## 2023-01-27 VITALS — BP 134/76 | HR 64 | Temp 97.9°F | Ht 68.0 in | Wt 304.9 lb

## 2023-01-27 DIAGNOSIS — Z87891 Personal history of nicotine dependence: Secondary | ICD-10-CM

## 2023-01-27 DIAGNOSIS — Z6841 Body Mass Index (BMI) 40.0 and over, adult: Secondary | ICD-10-CM | POA: Diagnosis not present

## 2023-01-27 DIAGNOSIS — I10 Essential (primary) hypertension: Secondary | ICD-10-CM

## 2023-01-27 DIAGNOSIS — B001 Herpesviral vesicular dermatitis: Secondary | ICD-10-CM | POA: Diagnosis not present

## 2023-01-27 MED ORDER — LOSARTAN POTASSIUM 25 MG PO TABS: 50.0000 mg | ORAL_TABLET | Freq: Every day | ORAL | 3 refills | Status: DC

## 2023-01-27 MED ORDER — VALACYCLOVIR HCL 1 G PO TABS: 1000.0000 mg | ORAL_TABLET | Freq: Two times a day (BID) | ORAL | 0 refills | Status: AC

## 2023-01-27 NOTE — Assessment & Plan Note (Signed)
Patient is doing well on current blood pressure medication, no issues with obtaining it. She is asymptomatic, no chest pain or lightheadedness. Discussed increasing the dose  - she has 60d left of the 48m tablets, I talked to her about taking two of these. At next visit if stable could switch her to 586mtablets to decrease pill burden.  - Increase losartan 5074m BMP today - Follow-up in one month

## 2023-01-27 NOTE — Progress Notes (Signed)
   CC: blood pressure follow-up  HPI:  Ebony Warren is a 47 y.o. person with medical history as below presenting to [facility] for blood pressure   Please see problem-based list for further details, assessments, and plans.  Past Medical History:  Diagnosis Date   Anxiety and depression    Arthritis    Bradycardia    Encounter to establish care 01/22/2021   Family history of adverse reaction to anesthesia    nausea   GERD (gastroesophageal reflux disease)    Headache    Multiple sclerosis ([place])    Screening mammogram, encounter for 01/22/2021   Seizures ([place])    told that they are not from the brain but stress related   Review of Systems:  As per HPI  Physical Exam:  Vitals:   01/27/23 1316 01/27/23 1338  BP: (!) 140/94 134/76  Pulse: 65 64  Temp: 97.9 F (36.6 C)   TempSrc: Oral   SpO2: 100%   Weight: (!) 304 lb 14.4 oz (138.3 kg)   Height: 5' 8"$  (1.727 m)    General: Resting comfortably in wheelchair HENT: Crusted over lesion on R side of lip. Vesicle at upper vermilion border with erythematous base, tender to palpation. CV: Regular rate, rhythm. No murmurs appreciated. Warm extremities.  Pulm: Normal work of breathing on room air. Clear to auscultation bilaterally.  Neuro: Awake, alert, conversing appropriately. Grossly non-focal. Psych: Normal mood, affect, speech.  Assessment & Plan:   Essential hypertension Patient is doing well on current blood pressure medication, no issues with obtaining it. She is asymptomatic, no chest pain or lightheadedness. Discussed increasing the dose  - she has 60d left of the 6m tablets, I talked to her about taking two of these. At next visit if stable could switch her to 51mtablets to decrease pill burden.  - Increase losartan 5037m BMP today - Follow-up in one month  Cold sore Ms. Warren is presenting today with two week history of cold sores. She has tried topical Abreva without much help. She  reports the lesions are painful and they continue to re-occur.   Patient is immunocompromised, as she is on monoclonal antibody treatment for multiple sclerosis. We will treat with short course of antiviral.   - Valtrex 1g twice daily x5d  Class 3 severe obesity with serious comorbidity and body mass index (BMI) of 45.0 to 49.9 in adult ([facility] but had to stop this because insurance was not covering. I have reached out to our pharmacy assistant today. I think she would greatly benefit from GLP-1.  - Follow-up with pharmacy regarding Ozempic coverage   Patient discussed with Dr.  LauGarnet KoyanagiD Internal Medicine PGY-3 Pager: [phone]

## 2023-01-27 NOTE — Assessment & Plan Note (Signed)
Previously was on Ozempic for a few months but had to stop this because insurance was not covering. I have reached out to our pharmacy assistant today. I think she would greatly benefit from GLP-1.  - Follow-up with pharmacy regarding Ozempic coverage

## 2023-01-27 NOTE — Assessment & Plan Note (Signed)
Ms. Bezio is presenting today with two week history of cold sores. She has tried topical Abreva without much help. She reports the lesions are painful and they continue to re-occur.   Patient is immunocompromised, as she is on monoclonal antibody treatment for multiple sclerosis. We will treat with short course of antiviral.   - Valtrex 1g twice daily x5d

## 2023-01-27 NOTE — Patient Instructions (Signed)
[name], it was a pleasure seeing you today!  Today we discussed: - I'd like for you to increase your blood pressure medication losartan to 52m (take two tablets of 256m. I'm going to check some blood work as well today.  - I have prescribed Valtrex (valacyclovir) 1,00024mwice daily for the next 5 days. This should help with your cold sores.  I have ordered the following labs today:   Lab Orders         BMP8+Anion Gap      I have ordered the following medication/changed the following medications:   Start the following medications: Meds ordered this encounter  Medications   valACYclovir (VALTREX) 1000 MG tablet    Sig: Take 1 tablet (1,000 mg total) by mouth 2 (two) times daily for 5 days.    Dispense:  10 tablet    Refill:  0     Follow-up:  1 month    Please make sure to arrive 15 minutes prior to your next appointment. If you arrive late, you may be asked to reschedule.   We look forward to seeing you next time. Please call our clinic at [phone] you have any questions or concerns. The best time to call is Monday-Friday from 9am-4pm, but there is someone available 24/7. If after hours or the weekend, call the main hospital number and ask for the Internal Medicine Resident On-Call. If you need medication refills, please notify your pharmacy one week in advance and they will send us [place].  Thank you for letting us [place] part in your care. Wishing you the best!  Thank you, PhiSanjuan DameD

## 2023-01-28 NOTE — Progress Notes (Signed)
Internal Medicine Clinic Attending ? ?Case discussed with Dr. Braswell  At the time of the visit.  We reviewed the resident?s history and exam and pertinent patient test results.  I agree with the assessment, diagnosis, and plan of care documented in the resident?s note.  ?

## 2023-01-29 ENCOUNTER — Other Ambulatory Visit (HOSPITAL_COMMUNITY): Payer: Self-pay

## 2023-01-29 LAB — BMP8+ANION GAP
Anion Gap: 18 mmol/L (ref 10.0–18.0)
BUN/Creatinine Ratio: 17 (ref 9–23)
BUN: 13 mg/dL (ref 6–24)
CO2: 21 mmol/L (ref 20–29)
Calcium: 9.8 mg/dL (ref 8.7–10.2)
Chloride: 100 mmol/L (ref 96–106)
Creatinine, Ser: 0.78 mg/dL (ref 0.57–1.00)
Glucose: 68 mg/dL — ABNORMAL LOW (ref 70–99)
Potassium: 4.5 mmol/L (ref 3.5–5.2)
Sodium: 139 mmol/L (ref 134–144)
eGFR: 95 mL/min/{1.73_m2} (ref >59)

## 2023-02-01 ENCOUNTER — Encounter: Payer: Self-pay | Admitting: Student

## 2023-02-01 ENCOUNTER — Telehealth: Payer: Self-pay

## 2023-02-01 NOTE — Telephone Encounter (Signed)
Call to pt reference next PREP class. Confirms she would like to start on 02/15/23.  Can do intake on Monday 02/08/23 at 145pm at the [facility].

## 2023-02-08 NOTE — Progress Notes (Signed)
[facility] PREP Evaluation  Patient Details  Name: Ebony Warren MRN: [id number] Date of Birth: 05-Mar-1976 Age: 47 y.o. PCP: Lottie Mussel, MD  Vitals:   02/08/23 1330  BP: 108/70  Pulse: 74  SpO2: 98%  Weight: 299 lb (135.6 kg)     [facility] Eval - 02/08/23 1600       [facility] "PREP" Location   [facility] "PREP" Location [facility]      Referral    Referring Provider [place]    Reason for referral Inactivity;Obesitity/Overweight;Hypertension   MS   Program Start Date 02/15/23   MW 1p-215p     Measurement   Waist Circumference 49 inches    Hip Circumference 57.5 inches    Body fat 49 percent      Information for Trainer   Goals goal weight 180 lbs, be able to walk, feel better in her own skin    Current Exercise none    Orthopedic Concerns right side hemiplegia, OA leftknee    Pertinent Medical History HTN, Migraines, MS, seizures in 2013    Current Barriers MD appts    Restrictions/Precautions Fall risk    Medications that affect exercise Medication causing dizziness/drowsiness      Timed Up and Go (TUGS)   Timed Up and Go High risk >13 seconds      Mobility and Daily Activities   I find it easy to walk up or down two or more flights of stairs. 1    I have no trouble taking out the trash. 1    I do housework such as vacuuming and dusting on my own without difficulty. 2    I can easily lift a gallon of milk (8lbs). 4    I can easily walk a mile. 1    I have no trouble reaching into high cupboards or reaching down to pick up something from the floor. 3    I do not have trouble doing out-door work such as Armed forces logistics/support/administrative officer, raking leaves, or gardening. 1      Mobility and Daily Activities   I feel younger than my age. 3    I feel independent. 3    I feel energetic. 2    I live an active life.  2    I feel strong. 2    I feel healthy. 2    I feel active as other people my age. 1      How fit and strong are you.   Fit and Strong Total Score 28             Past Medical History:  Diagnosis Date   Anxiety and depression    Arthritis    Bradycardia    Encounter to establish care 01/22/2021   Family history of adverse reaction to anesthesia    nausea   GERD (gastroesophageal reflux disease)    Headache    Multiple sclerosis ([place])    Screening mammogram, encounter for 01/22/2021   Seizures ([place])    told that they are not from the brain but stress related   Past Surgical History:  Procedure Laterality Date   BREAST REDUCTION SURGERY Bilateral 06/23/2021   Procedure: MAMMARY REDUCTION  (BREAST);  Surgeon: Cindra Presume, MD;  Location: [facility];  Service: Plastics;  Laterality: Bilateral;   CHOLECYSTECTOMY     KNEE SURGERY Bilateral    Arthroscopy   Social History   Tobacco Use  Smoking Status Former   Packs/day: 0.35   Years: 3.00  Total pack years: 1.05   Types: Cigarettes   Quit date: 12/07/2006   Years since quitting: 16.1  Smokeless Tobacco Never    Barnett Hatter 02/08/2023, 4:15 PM

## 2023-02-10 ENCOUNTER — Encounter: Payer: Self-pay | Admitting: Diagnostic Neuroimaging

## 2023-02-10 ENCOUNTER — Ambulatory Visit (INDEPENDENT_AMBULATORY_CARE_PROVIDER_SITE_OTHER): Payer: Medicare HMO | Admitting: Diagnostic Neuroimaging

## 2023-02-10 VITALS — BP 123/82 | HR 67 | Ht 68.0 in | Wt 299.0 lb

## 2023-02-10 DIAGNOSIS — G35 Multiple sclerosis: Secondary | ICD-10-CM | POA: Diagnosis not present

## 2023-02-10 MED ORDER — BACLOFEN 20 MG PO TABS: 20.0000 mg | ORAL_TABLET | Freq: Three times a day (TID) | ORAL | 4 refills | Status: DC

## 2023-02-10 MED ORDER — GABAPENTIN 300 MG PO CAPS: 600.0000 mg | ORAL_CAPSULE | Freq: Three times a day (TID) | ORAL | 4 refills | Status: DC

## 2023-02-10 MED ORDER — TIZANIDINE HCL 4 MG PO TABS: 4.0000 mg | ORAL_TABLET | Freq: Three times a day (TID) | ORAL | 4 refills | Status: DC

## 2023-02-10 NOTE — Progress Notes (Signed)
[facility]  PATIENT: Ebony Warren DOB: July 11, 1976  REFERRING CLINICIAN: Rehman, Areeg N, DO   HISTORY FROM: patient  REASON FOR VISIT: follow up   HISTORICAL  CHIEF COMPLAINT:  Chief Complaint  Patient presents with   Follow-up    Patient in room #7 and alone. Pt states she is well and stable, no new concerns.    HISTORY OF PRESENT ILLNESS:   UPDATE (02/10/23, VRP): Since last visit, doing well. Symptoms are STABLE. Severity is moderate. No alleviating or aggravating factors. Tolerating ocrevus and other meds.    UPDATE (05/26/22, VRP): Since last visit, doing well. Symptoms are stable. No alleviating or aggravating factors. Tolerating ocrevus. Planning to go on cruise vacation in April 2024.   UPDATE (09/23/21, VRP): Since last visit, slightly more balance and gait issues. Has power chair, but it was defective on arrival, now waiting for repair. Tolerating meds.   UPDATE (03/31/21, VRP): Since last visit, here for evaluation for electric wheelchair / power chair again. Symptoms are progressive. Severity is moderate. No alleviating or aggravating factors. Tolerating meds / ocrevus. Here for face-face evaluation and mobility assessment for power wheelchair. Has multiple sclerosis and hemiplegia. Cannot perform ADLs due to mobility issues. Cannot propel manual wheelchair due to hand weakness. Living at home with son and mother. Has personal care aid. Not driving currently.   UPDATE (12/10/20, VRP): Since last visit, more left leg weakness and gait difficulty. Requesting evaluation for electric wheelchair / power chair. Symptoms are progressive. Severity is moderate. No alleviating or aggravating factors. Tolerating meds / ocrevus. Here for face-face evaluation of power wheelchair. Cannot perform ADLs due to mobility issues. Cannot propel manual wheelchair due to hand weakness. Living at home with son and mother. Has personal care aid. Not driving currently.    UPDATE (05/21/20, VRP): Since last visit, doing fair. More depression, spasms. Had event last week of body trembling, no LOC, lasting 10 minutes. Symptoms are progressive. Tolerating ocrevus and muscle relaxers.  UPDATE (11/20/19, VRP): Here for face-face evaluation of wheelchair need. Currently using a wheelchair due to [place] and mobility impairment, now needs a replacement. Patient able to propel wheelchair. Overall, since last visit, doing fair. Continues on ocrevus; had mild reaction at IV site last time. No other alleviating or aggravating factors. Tolerating meds.    UPDATE (10/25/18, VRP): Since last visit, doing about the same. Symptoms are stable EXCEPT right hand weakness and coordination is worse. Some right shoulder twitching. Memory loss continues. No alleviating or aggravating factors. Tolerating meds.    UPDATE (04/18/18, VRP): Since last visit, doing well on ocrevus, except had another fall. Recently more urine incont, and right leg swelling. Tolerating meds. No alleviating or aggravating factors.   UPDATE (10/18/17, VRP): Since last visit, doing well. Tolerating ocrevus. No alleviating or aggravating factors. No new events. C/o cough and dry mouth issues.   UPDATE 05/19/17: Since last visit, now transitioned to ocrevus (May 21, June 4). Did well with infusion. Spasms stable. Left leg slightly better. Bladder slightly better. Depression stable. Using rollator walker. No other triggering factors. Overall doing well.   UPDATE 02/08/17: Since last visit, MRI brain shows 1 new lesion. Here to discuss ocrevus. Also with more left leg pain, muscle spasms, gait diff, fatigue. Right ear pain better. Bladder incont slightly better with oxybutinin initially, but not anymore.   UPDATE 12/22/16: Since last visit, was doing well until Nov 2017 and then developed stabbing right ear pain (continues to present). Also with new  and increasing spasm and cramps in right hand and left leg since Dec 2017.  Continues with fatigue and bladder incontinence.  UPDATE 05/13/16: Since last visit, doing well. No new MS events or symptoms. In retrospect, may have lost vision in right eye (2015, and possibly March 2016), lasting 3-7 days. Didn't mention to me until now. Recently this was noted optometry and ophthalmology exams.   UPDATE 01/27/16: Since last visit, fatigue improved with adjusting medication timing. Bladder issues stable. Had UTI at last visit, tx'd with abx, now better.  UPDATE 10/22/15: Since last visit was doing well. Then last week had new onset weakness in legs, bladder incontinence, strong smelling urine, and hand tremors. Now sxs almost fully resolved since yesterday.  UPDATE 05/01/15: Symptoms stable. Tolerating tecfidera.  UPDATE 01/25/15: Since last visit, had more problems for a few weeks (lower ext weakness, bladder incont), but now improved. Now on tecfidera x 3 weeks. Some itching and stomach issues with tecfidera, but mild. Main issues now consist of lower extremity pain, spasms, urinary leakage, daytime fatigue.  UPDATE 10/31/14: Since last visit, sxs are stable. Test results and diagnosis reviewed. No new events. Using cane to walk.  UPDATE (10/02/14): 47 year old right-handed female here for evaluation of double vision. 2008 patient was 6 months pregnant with her son, when all of a sudden she didn't feel good. She noticed right hand clumsiness and incoordination. This lasted for approximately 9 months and then stopped. She did not seek medical attention for this problem. 2013 patient had onset of right arm weakness, right leg weakness, numbness on the right side, intermittent shaking sensation all over. Patient was evaluated in [place] and diagnosed with possible stroke. 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 Patient did not recover right arm or leg strength. Her speech was also affected. October 2014 patient moved to [facility]. 08/30/2014 patient was at home hanging clothes  over the bathtub when she fell down. She is not sure what caused her fall. Her right leg may have given out. Patient fell and struck her head against the soap dish which broke off. Patient did not lose consciousness. Patient's family advised her to go to the emergency room. Patient was evaluated with CT and MRI of the brain. No acute findings were found. MRI of the brain did show multiple white matter lesions suspicious for chronic multiple sclerosis versus chronic small vessel ischemic disease. Advised to follow-up in outpatient basis. Since this fall patient has noticed some additional blurred vision, and double vision especially when looking to the left side.   REVIEW OF SYSTEMS: Full 14 system review of systems performed and negative except for: as per HPI.    ALLERGIES: Allergies  Allergen Reactions   Bioflavonoids Hives   Oxycodone-Acetaminophen Itching   Hydrocodone Itching    Stomach cramps   Morphine Itching   Orange Fruit [Citrus]     Acidic foods Makes her itchy and her face "feels like its on fire", have rash.   Oxycodone Itching    rash   Tomato Hives   Other Rash    Narcotics. Spicy foods (cause seizures)    HOME MEDICATIONS: Outpatient Medications Prior to Visit  Medication Sig Dispense Refill   ARIPiprazole (ABILIFY) 5 MG tablet Take 5 mg by mouth daily.     cholecalciferol (VITAMIN D) 1000 UNITS tablet Take 1,000 Units by mouth daily.     diphenhydrAMINE (BENADRYL) 25 mg capsule Take 25 mg by mouth every 6 (six) hours as needed for allergies.  DULoxetine (CYMBALTA) 30 MG capsule Take 1 capsule by mouth daily.     furosemide (LASIX) 20 MG tablet Take 1 tablet (20 mg total) by mouth daily as needed. 90 tablet 3   LORazepam (ATIVAN) 1 MG tablet Take 1 tablet (1 mg total) by mouth daily. 14 tablet 0   losartan (COZAAR) 25 MG tablet Take 2 tablets (50 mg total) by mouth daily. 180 tablet 3   Multiple Vitamins-Minerals (MULTIVITAMIN ADULT PO) Take 1 tablet by mouth  daily.     ocrelizumab (OCREVUS) 300 MG/10ML injection Infuse 600 mg IV Every 6 months 20 mL 1   oxybutynin (DITROPAN-XL) 10 MG 24 hr tablet Take 1 tablet (10 mg total) by mouth daily as needed (to prevent incontinence). 90 tablet 3   pantoprazole (PROTONIX) 40 MG tablet Take 1 tablet (40 mg total) by mouth daily. 90 tablet 3   baclofen (LIORESAL) 20 MG tablet Take 1 tablet (20 mg total) by mouth 3 (three) times daily. 270 tablet 4   gabapentin (NEURONTIN) 300 MG capsule Take 2 capsules (600 mg total) by mouth 3 (three) times daily. 540 capsule 4   tiZANidine (ZANAFLEX) 4 MG tablet Take 1 tablet (4 mg total) by mouth 3 (three) times daily. 270 tablet 4   megestrol (MEGACE) 40 MG tablet Take 1 tablet (40 mg total) by mouth 2 (two) times daily. 60 tablet 0   Facility-Administered Medications Prior to Visit  Medication Dose Route Frequency Provider Last Rate Last Admin   diphenhydrAMINE (BENADRYL) injection 50 mg  50 mg Intravenous Once Josselin Gaulin R, MD       famotidine (PEPCID) 20 mg in sodium chloride 0.9 % 50 mL IVPB  20 mg Intravenous Once Kamron Portee R, MD       methylPREDNISolone sodium succinate (SOLU-MEDROL) 125 mg in sodium chloride 0.9 % 50 mL IVPB  125 mg Intravenous Once Leavy Heatherly R, MD       ocrelizumab (OCREVUS) 600 mg in sodium chloride 0.9 % 250 mL  600 mg Intravenous Once Yashvi Jasinski R, MD       acetaminophen (TYLENOL) tablet 650 mg  650 mg Oral Once Machi Whittaker, Earlean Polka, MD        PAST MEDICAL HISTORY: Past Medical History:  Diagnosis Date   Anxiety and depression    Arthritis    Bradycardia    Encounter to establish care 01/22/2021   Family history of adverse reaction to anesthesia    nausea   GERD (gastroesophageal reflux disease)    Headache    Multiple sclerosis ([place])    Screening mammogram, encounter for 01/22/2021   Seizures ([place])    told that they are not from the brain but stress related    PAST SURGICAL HISTORY: Past Surgical  History:  Procedure Laterality Date   BREAST REDUCTION SURGERY Bilateral 06/23/2021   Procedure: MAMMARY REDUCTION  (BREAST);  Surgeon: Cindra Presume, MD;  Location: [facility];  Service: Plastics;  Laterality: Bilateral;   CHOLECYSTECTOMY     KNEE SURGERY Bilateral    Arthroscopy    FAMILY HISTORY: Family History  Problem Relation Age of Onset   Hypertension Mother    Atrial fibrillation Mother    Hypertension Father    Atrial fibrillation Maternal Grandmother     SOCIAL HISTORY:  Social History   Socioeconomic History   Marital status: Single    Spouse name: Not on file   Number of children: 1   Years of education: [employer] education  level: Not on file  Occupational History    Employer: OTHER    Comment: disabled  Tobacco Use   Smoking status: Former    Packs/day: 0.35    Years: 3.00    Total pack years: 1.05    Types: Cigarettes    Quit date: 12/07/2006    Years since quitting: 16.1   Smokeless tobacco: Never  Vaping Use   Vaping Use: Never used  Substance and Sexual Activity   Alcohol use: No    Alcohol/week: 0.0 standard drinks of alcohol    Comment: quit: 2013 (socially)   Drug use: Not Currently    Types: Marijuana    Comment: Smokes approx 1-2 per day   Sexual activity: Not Currently  Other Topics Concern   Not on file  Social History Narrative   Patient lives at home with family.   Caffeine Use: drinks a cup of hot tea with tecfidera in the am   Patient is right handed.   Patient has a college education   Social Determinants of Radio broadcast assistant Strain: Low Risk  (06/12/2022)   Overall Financial Resource Strain (CARDIA)    Difficulty of Paying Living Expenses: Not hard at all  Food Insecurity: No Food Insecurity (10/27/2022)   Hunger Vital Sign    Worried About Running Out of Food in the Last Year: Never true    Ran Out of Food in the Last Year: Never true  Transportation Needs: Unmet Transportation Needs (10/27/2022)   PRAPARE -  Hydrologist (Medical): Yes    Lack of Transportation (Non-Medical): No  Physical Activity: Inactive (06/12/2022)   Exercise Vital Sign    Days of Exercise per Week: 0 days    Minutes of Exercise per Session: 0 min  Stress: Unknown (06/12/2022)   [place]    Feeling of Stress : Patient refused  Recent Concern: Stress - Stress Concern Present (06/12/2022)   [place]    Feeling of Stress : Very much  Social Connections: Moderately Integrated (10/27/2022)   Social Connection and Isolation Panel [NHANES]    Frequency of Communication with Friends and Family: More than three times a week    Frequency of Social Gatherings with Friends and Family: More than three times a week    Attends Religious Services: More than 4 times per year    Active Member of [employer] or Organizations: Yes    Attends Archivist Meetings: Never    Marital Status: Divorced  Human resources officer Violence: Not At Risk (06/12/2022)   Humiliation, Afraid, Rape, and Kick questionnaire    Fear of Current or Ex-Partner: No    Emotionally Abused: No    Physically Abused: No    Sexually Abused: No     PHYSICAL EXAM  Vitals:   02/10/23 1200  BP: 123/82  Pulse: 67  Weight: 299 lb (135.6 kg)  Height: '5\' 8"'$  (1.727 m)   No results found.   Body mass index is 45.46 kg/m.  GENERAL EXAM: Patient is in no distress; well developed, nourished and groomed; neck is supple  CARDIOVASCULAR: Regular rate and rhythm, no murmurs, no carotid bruits  NEUROLOGIC: MENTAL STATUS: awake, alert, language fluent, comprehension intact, naming intact, fund of knowledge appropriate; SLOW SCANNING SPEECH PATTERN CRANIAL NERVE: PUPILS PINPOINT AND REACTIVE; visual fields full to confrontation, extraocular muscles --> SACCADIC DYSMETRIA; NO PTOSIS; facial sensation symmetric, FACE  -->  DEC LEFT NL FOLD, hearing intact, palate elevates symmetrically, uvula midline, shoulder shrug symmetric, tongue midline. MOTOR: INCREASED TONE IN RUE and RLE; BUE 3 PROX AND 2-3 DISTAL; RIGHT HAND WEAKER THAN LEFT; BLE 2 (right weaker than left); RIGHT FOOT DF 1+ SENSORY: DECR IN RIGHT HAND AND RIGHT FOOT TO ALL MODALITIES COORDINATION: BUE DYSMETRIA; SLOW FINGER AND FOOT TAP ON RIGHT REFLEXES: RUE 2+, LUE 1; BLE TRACE GAIT/STATION: IN POWER WHEELCHAIR    DIAGNOSTIC DATA (LABS, IMAGING, TESTING) - I reviewed patient records, labs, notes, testing and imaging myself where available.  Lab Results  Component Value Date   WBC 6.3 10/27/2022   HGB 14.8 10/27/2022   HCT 45.3 10/27/2022   MCV 95 10/27/2022   PLT 279 10/27/2022      Component Value Date/Time   NA 139 01/27/2023 1413   K 4.5 01/27/2023 1413   CL 100 01/27/2023 1413   CO2 21 01/27/2023 1413   GLUCOSE 68 (L) 01/27/2023 1413   GLUCOSE 85 06/23/2021 1136   BUN 13 01/27/2023 1413   CREATININE 0.78 01/27/2023 1413   CALCIUM 9.8 01/27/2023 1413   PROT 6.7 05/26/2022 1122   ALBUMIN 4.0 05/26/2022 1122   AST 14 05/26/2022 1122   ALT 10 05/26/2022 1122   ALKPHOS 95 05/26/2022 1122   BILITOT 0.5 05/26/2022 1122   GFRNONAA 76 12/10/2020 1251   GFRAA 87 12/10/2020 1251   No results found for: "CHOL", "HDL", "LDLCALC", "LDLDIRECT", "TRIG", "CHOLHDL" Lab Results  Component Value Date   HGBA1C 5.3 08/19/2022   No results found for: "VITAMINB12" No results found for: "TSH"  Vit D, 25-Hydroxy  Date Value Ref Range Status  09/23/2021 38.1 30.0 - 100.0 ng/mL Final    Comment:    Vitamin D deficiency has been defined by the Institute of Medicine and an Endocrine Society practice guideline as a level of serum 25-OH vitamin D less than 20 ng/mL (1,2). The Endocrine Society went on to further define vitamin D insufficiency as a level between 21 and 29 ng/mL (2). 1. IOM (Institute of Medicine). 2010. Dietary reference     intakes for calcium and D. [place]: The    [employer]. 2. Holick MF, Binkley [place], Bischoff-Ferrari HA, et al.    Evaluation, treatment, and prevention of vitamin D    deficiency: an Endocrine Society clinical practice    guideline. JCEM. 2011 Jul; 96(7):1911-30.   12/22/2016 34.1 30.0 - 100.0 ng/mL Final    Comment:    Vitamin D deficiency has been defined by the [place] practice guideline as a level of serum 25-OH vitamin D less than 20 ng/mL (1,2). The Endocrine Society went on to further define vitamin D insufficiency as a level between 21 and 29 ng/mL (2). 1. IOM (Institute of Medicine). 2010. Dietary reference    intakes for calcium and D. [place]: The    [employer]. 2. Holick MF, Binkley [place], Bischoff-Ferrari HA, et al.    Evaluation, treatment, and prevention of vitamin D    deficiency: an Endocrine Society clinical practice    guideline. JCEM. 2011 Jul; 96(7):1911-30.   10/22/2015 35.7 30.0 - 100.0 ng/mL Final    Comment:    Vitamin D deficiency has been defined by the [place] practice guideline as a level of serum 25-OH vitamin D less than 20 ng/mL (1,2). The Endocrine Society went on to further define vitamin D insufficiency as a level between  21 and 29 ng/mL (2). 1. IOM (Institute of Medicine). 2010. Dietary reference    intakes for calcium and D. [place]: The    [employer]. 2. Holick MF, Binkley [place], Bischoff-Ferrari HA, et al.    Evaluation, treatment, and prevention of vitamin D    deficiency: an Endocrine Society clinical practice    guideline. JCEM. 2011 Jul; 96(7):1911-30.    Lymphocytes Absolute  Date Value Ref Range Status  05/26/2022 1.6 0.7 - 3.1 x10E3/uL Final  09/23/2021 0.9 0.7 - 3.1 x10E3/uL Final  12/10/2020 1.5 0.7 - 3.1 x10E3/uL Final    11/19/18 MRI of the brain with and without contrast shows the  following: 1.    T2/flair hyperintense foci in the brainstem, cerebellum, left thalamus and hemispheres in a pattern and configuration consistent with chronic demyelinating plaque associated with multiple sclerosis.  None of the foci appear to be acute and there were no new lesions compared to the 2018 MRI. 2.    There is a normal enhancement pattern and there are no acute findings.  11/19/18 MRI of the cervical spine with and without contrast shows the following: 1.     There are about 8 T2 hyperintense foci within the spinal cord consistent with chronic demyelinating plaque associated with multiple sclerosis.  None of the foci appears to be acute.  They were all present on the MRI dated 11/05/2015. 2.     There are no significant cervical spine degenerative changes. 3.     There is a normal enhancement pattern and no acute findings.  05/23/20 MRI brain MRI brain without contrast demonstrating: -Mild stable periventricular, pericallosal and subcortical foci of chronic demyelinating plaques. -No acute finding.  10/08/21 MRI brain (with and without) demonstrating: - Few small, stable chronic demyelinating plaques.  - No acute plaques.    Labs - ANA, ANCA, ACE, HIV, RPR - all negative  11/01/14 anti-JCV ab - 1.31 (H) positive  07/27/18 RLE u/s - No evidence of DVT and no evidence of venous insufficiency.    ASSESSMENT AND PLAN  47 y.o. year old female here with multiple focal neurologic attacks since 2008. Neurologic exam demonstrates long tract signs affecting the right arm and right leg more than left side. 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 Other labs negative.   On tecfidera since Feb 2016. Had flare up symptoms in Nov 2016 and Nov-Dec 2017. Also new plaque in Jan 2018. Now on ocrevus since May/June 2018.   Muscle spasms and leg pain (on baclofen, tizanidine and gabapentin).  Fatigue improved with medication regimen adjustment.   Bladder  incontinence --> using adult pads and oxybutinin   Depression stable (seeing Dr. Sherryle Lis, [facility]).    Dx:  Multiple sclerosis ([place])     PLAN:  Multiple sclerosis disease modifying therapy (established problem, progressive) - continue ocrevus (Jan / July) - check CBC, immunoglobulin panel every 6 months - continue multivitamin and vitamin D supplements  Muscle spasms (established problem, stable) - continue baclofen '20mg'$  three times per day for muscle spasms - continue tizanidine '4mg'$  three times per day for muscle spasms  MEMORY LOSS - optimize nutrition and exercise - follow up with psychiatry (optimize depression / anxiety treatments; consider caffeine vs stimulant meds) - safety / supervision issues reviewed - no driving, caution with meds, finances  Bilateral leg pain (established problem, stable) - continue gabapentin up to '600mg'$  three times per day  Bladder incontinence (established problem, improved) - consider follow up  with urology (tried and failed oxybutynin)  Depression / anxiety (established problem, stable) - per Dr. Sherryle Lis [facility]) - continue abilify, duloxetine  No orders of the defined types were placed in this encounter.  Meds ordered this encounter  Medications   baclofen (LIORESAL) 20 MG tablet    Sig: Take 1 tablet (20 mg total) by mouth 3 (three) times daily.    Dispense:  270 tablet    Refill:  4   gabapentin (NEURONTIN) 300 MG capsule    Sig: Take 2 capsules (600 mg total) by mouth 3 (three) times daily.    Dispense:  540 capsule    Refill:  4   tiZANidine (ZANAFLEX) 4 MG tablet    Sig: Take 1 tablet (4 mg total) by mouth 3 (three) times daily.    Dispense:  270 tablet    Refill:  4   Return in about 8 months (around 10/13/2023).    Penni Bombard, MD [id number], [id number] PM Certified in Neurology, Neurophysiology and Neuroimaging  [facility] [address] [place], [place]  [address] [phone]

## 2023-02-16 ENCOUNTER — Ambulatory Visit (INDEPENDENT_AMBULATORY_CARE_PROVIDER_SITE_OTHER): Payer: Medicare HMO | Admitting: Internal Medicine

## 2023-02-16 ENCOUNTER — Encounter (INDEPENDENT_AMBULATORY_CARE_PROVIDER_SITE_OTHER): Payer: Self-pay | Admitting: Internal Medicine

## 2023-02-16 VITALS — BP 139/84 | HR 71 | Temp 98.3°F | Ht 68.0 in | Wt 309.0 lb

## 2023-02-16 DIAGNOSIS — R5383 Other fatigue: Secondary | ICD-10-CM

## 2023-02-16 DIAGNOSIS — Z1331 Encounter for screening for depression: Secondary | ICD-10-CM | POA: Diagnosis not present

## 2023-02-16 DIAGNOSIS — R0602 Shortness of breath: Secondary | ICD-10-CM | POA: Insufficient documentation

## 2023-02-16 DIAGNOSIS — Z6841 Body Mass Index (BMI) 40.0 and over, adult: Secondary | ICD-10-CM

## 2023-02-16 DIAGNOSIS — I10 Essential (primary) hypertension: Secondary | ICD-10-CM | POA: Diagnosis not present

## 2023-02-16 DIAGNOSIS — R29818 Other symptoms and signs involving the nervous system: Secondary | ICD-10-CM | POA: Insufficient documentation

## 2023-02-16 NOTE — Assessment & Plan Note (Signed)
Patient is on multiple medications associated with weight gain this will make it harder for her to lose weight.  Her IC is also lower than predicted by about 32% so her metabolism is also slow.  This is multifactorial and she needs to be screened for obstructive sleep apnea.

## 2023-02-16 NOTE — Progress Notes (Signed)
Chief Complaint:   OBESITY Ebony Warren (MR# [id number]) is a 47 y.o. female who presents for evaluation and treatment of obesity and related comorbidities. Current BMI is Body mass index is 46.98 kg/m. Ebony Warren has been struggling with her weight for many years and has been unsuccessful in either losing weight, maintaining weight loss, or reaching her healthy weight goal.  Ebony Warren is currently in the action stage of change and ready to dedicate time achieving and maintaining a healthier weight. Ebony Warren is interested in becoming our patient and working on intensive lifestyle modifications including (but not limited to) diet and exercise for weight loss.  Ebony Warren's habits were reviewed today and are as follows: Her family eats meals together, her desired weight loss is 129 lbs, she has been heavy most of her life, she started gaining weight during the pandemic, her heaviest weight ever was 312 pounds, she is a picky eater and doesn't like to eat healthier foods, she has significant food cravings issues, and she is frequently drinking liquids with calories.  Depression Screen Ebony Warren's Food and Mood (modified PHQ-9) score was 2.  Subjective:   1. Other fatigue Ebony Warren admits to daytime somnolence and denies waking up still tired. Patient has a history of symptoms of daytime fatigue. Ebony Warren generally gets 8 or 9 hours of sleep per night, and states that she has generally restful sleep. Snoring is present. Apneic episodes are not present. Epworth Sleepiness Score is 9.   2. SOB (shortness of breath) on exertion Ebony Warren notes increasing shortness of breath with exercising and seems to be worsening over time with weight gain. She notes getting out of breath sooner with activity than she used to. This has not gotten worse recently. Ebony Warren denies shortness of breath at rest or orthopnea.  3. Essential hypertension Blood pressure not at goal for age and risk  category.  On losartan, furosemide without adverse effects.  Most recent renal parameters reviewed which showed normal electrolytes and kidney function.  4. Suspected sleep apnea Patient has an Epworth of 10 and high risk phenotype.  Assessment/Plan:   1. Other fatigue Ebony Warren does feel that her weight is causing her energy to be lower than it should be. Fatigue may be related to obesity, depression or many other causes. Labs will be ordered, and in the meanwhile, Ebony Warren will focus on self care including making healthy food choices, increasing physical activity and focusing on stress reduction.  - EKG 12-Lead - Vitamin B12  2. SOB (shortness of breath) on exertion Ebony Warren does feel that she gets out of breath more easily that she used to when she exercises. Ebony Warren's shortness of breath appears to be obesity related and exercise induced. She has agreed to work on weight loss and gradually increase exercise to treat her exercise induced shortness of breath. Will continue to monitor closely.  3. Essential hypertension Start weight loss therapy.  Monitor for symptoms of orthostasis while losing weight. Continue current regimen and home monitoring for a goal blood pressure of 120/80.  - Comprehensive metabolic panel  4. Suspected sleep apnea We will consider referral for sleep study at the next office visit.  5. Depression screen Ebony Warren had a negative depression screening.   6. Class 3 severe obesity with serious comorbidity and body mass index (BMI) of 45.0 to 49.9 in adult, unspecified obesity type Ebony Warren) Patient is on multiple medications associated with weight gain this will make it harder for her to lose weight.  Her IC is also  lower than predicted by about 32% so her metabolism is also slow.  This is multifactorial and she needs to be screened for obstructive sleep apnea.  - Insulin, random - TSH - VITAMIN D 25 Hydroxy (Vit-D Deficiency, Fractures) - Lipid Panel With  LDL/HDL Ratio  Ebony Warren is currently in the action stage of change and her goal is to continue with weight loss efforts. I recommend Ebony Warren begin the structured treatment plan as follows:  She has agreed to the Category 2 Plan.  Exercise goals: No exercise has been prescribed at this time.   Behavioral modification strategies: increasing lean protein intake, decreasing simple carbohydrates, increasing vegetables, increasing water intake, decreasing liquid calories, no skipping meals, meal planning and cooking strategies, keeping healthy foods in the home, better snacking choices, and planning for success.  She was informed of the importance of frequent follow-up visits to maximize her success with intensive lifestyle modifications for her multiple health conditions. She was informed we would discuss her lab results at her next visit unless there is a critical issue that needs to be addressed sooner. Ebony Warren agreed to keep her next visit at the agreed upon time to discuss these results.  Objective:   Blood pressure 139/84, pulse 71, temperature 98.3 F (36.8 C), height 5\' 8"  (1.727 m), weight (!) 309 lb (140.2 kg), SpO2 100 %. Body mass index is 46.98 kg/m.  EKG: Normal sinus rhythm, rate 71 BPM.  Indirect Calorimeter completed today shows a VO2 of 206 and a REE of 1426.  Her calculated basal metabolic rate is (unable to determine) thus her basal metabolic rate is  (unable to determine)  than expected.  General: Cooperative, alert, well developed, in no acute distress. HEENT: Conjunctivae and lids unremarkable. Cardiovascular: Regular rhythm.  Lungs: Normal work of breathing. Neurologic: No focal deficits.   Lab Results  Component Value Date   CREATININE 0.85 02/23/2023   BUN 14 02/23/2023   NA 140 02/23/2023   K 4.6 02/23/2023   CL 104 02/23/2023   CO2 22 02/23/2023   Lab Results  Component Value Date   ALT 39 (H) 02/23/2023   AST 29 02/23/2023   ALKPHOS 110 02/23/2023    BILITOT 0.4 02/23/2023   Lab Results  Component Value Date   HGBA1C 5.3 08/19/2022   Lab Results  Component Value Date   INSULIN 25.0 (H) 02/23/2023   Lab Results  Component Value Date   TSH 1.690 02/23/2023   Lab Results  Component Value Date   CHOL 149 02/23/2023   HDL 50 02/23/2023   LDLCALC 85 02/23/2023   TRIG 72 02/23/2023   Lab Results  Component Value Date   WBC 6.3 10/27/2022   HGB 14.8 10/27/2022   HCT 45.3 10/27/2022   MCV 95 10/27/2022   PLT 279 10/27/2022   Lab Results  Component Value Date   IRON 68 10/27/2022   TIBC 313 10/27/2022   FERRITIN 41 10/27/2022   Attestation Statements:   Reviewed by clinician on day of visit: allergies, medications, problem list, medical history, surgical history, family history, social history, and previous encounter notes.  Time spent on visit including pre-visit chart review and post-visit charting and care was 40 minutes.   Trude Mcburney, am acting as transcriptionist for Worthy Rancher, MD.  I have reviewed the above documentation for accuracy and completeness, and I agree with the above. -Worthy Rancher, MD

## 2023-02-16 NOTE — Assessment & Plan Note (Signed)
Blood pressure not at goal for age and risk category.  On losartan, furosemide without adverse effects.  Most recent renal parameters reviewed which showed normal electrolytes and kidney function.  Start weight loss therapy.  Monitor for symptoms of orthostasis while losing weight. Continue current regimen and home monitoring for a goal blood pressure of 120/80.

## 2023-02-16 NOTE — Assessment & Plan Note (Signed)
Patient has an Epworth of 10 and high risk phenotype.  We will consider referral for sleep study at the next office visit.

## 2023-02-18 ENCOUNTER — Telehealth: Payer: Self-pay

## 2023-02-18 NOTE — Telephone Encounter (Signed)
Mailing novo nordisk application to patients home for Ozempic PAP.

## 2023-02-18 NOTE — Progress Notes (Signed)
[facility] PRE[facility] Weekly Session  Patient Details  Name: Ebony Warren MRN: [id number] Date of Birth: October 31, 1976 Age: 47 y.o. PCP: Lottie Mussel, MD  There were no vitals filed for this visit.   [facility] Weekly seesion - 02/18/23 1200       [facility] "PREP" Location   [facility] Location [facility]      Weekly Session   Topic Discussed Goal setting and welcome to the program   fit testing and stretching   Classes attended to date [place] 02/18/2023, 12:21 PM

## 2023-02-23 DIAGNOSIS — Z6841 Body Mass Index (BMI) 40.0 and over, adult: Secondary | ICD-10-CM | POA: Diagnosis not present

## 2023-02-23 DIAGNOSIS — R5383 Other fatigue: Secondary | ICD-10-CM | POA: Diagnosis not present

## 2023-02-23 DIAGNOSIS — I10 Essential (primary) hypertension: Secondary | ICD-10-CM | POA: Diagnosis not present

## 2023-02-24 ENCOUNTER — Ambulatory Visit: Payer: Medicare HMO | Admitting: Gastroenterology

## 2023-02-24 LAB — LIPID PANEL WITH LDL/HDL RATIO
Cholesterol, Total: 149 mg/dL (ref 100–199)
HDL: 50 mg/dL (ref >39)
LDL Chol Calc (NIH): 85 mg/dL (ref 0–99)
LDL/HDL Ratio: 1.7 ratio (ref 0.0–3.2)
Triglycerides: 72 mg/dL (ref 0–149)
VLDL Cholesterol Cal: 14 mg/dL (ref 5–40)

## 2023-02-24 LAB — COMPREHENSIVE METABOLIC PANEL
ALT: 39 IU/L — ABNORMAL HIGH (ref 0–32)
AST: 29 IU/L (ref 0–40)
Albumin/Globulin Ratio: 1.6 (ref 1.2–2.2)
Albumin: 4.3 g/dL (ref 3.9–4.9)
Alkaline Phosphatase: 110 IU/L (ref 44–121)
BUN/Creatinine Ratio: 16 (ref 9–23)
BUN: 14 mg/dL (ref 6–24)
Bilirubin Total: 0.4 mg/dL (ref 0.0–1.2)
CO2: 22 mmol/L (ref 20–29)
Calcium: 9.5 mg/dL (ref 8.7–10.2)
Chloride: 104 mmol/L (ref 96–106)
Creatinine, Ser: 0.85 mg/dL (ref 0.57–1.00)
Globulin, Total: 2.7 g/dL (ref 1.5–4.5)
Glucose: 78 mg/dL (ref 70–99)
Potassium: 4.6 mmol/L (ref 3.5–5.2)
Sodium: 140 mmol/L (ref 134–144)
Total Protein: 7 g/dL (ref 6.0–8.5)
eGFR: 86 mL/min/{1.73_m2} (ref >59)

## 2023-02-24 LAB — VITAMIN D 25 HYDROXY (VIT D DEFICIENCY, FRACTURES): Vit D, 25-Hydroxy: 55.6 ng/mL (ref 30.0–100.0)

## 2023-02-24 LAB — VITAMIN B12: Vitamin B-12: 525 pg/mL (ref 232–1245)

## 2023-02-24 LAB — TSH: TSH: 1.69 u[IU]/mL (ref 0.450–4.500)

## 2023-02-24 LAB — INSULIN, RANDOM: INSULIN: 25 u[IU]/mL — ABNORMAL HIGH (ref 2.6–24.9)

## 2023-02-25 NOTE — Progress Notes (Signed)
[facility] PRE[facility] Weekly Session  Patient Details  Name: Ebony Warren MRN: [id number] Date of Birth: Jun 11, 1976 Age: 47 y.o. PCP: Lottie Mussel, MD  Vitals:   02/22/23 1218  Weight: (!) 309 lb (140.2 kg)     [facility] Weekly seesion - 02/25/23 1200       [facility] "PREP" Location   [facility]      Weekly Session   Topic Discussed Importance of resistance training;Other ways to be active    Minutes exercised this week 35 minutes    Classes attended to date [place] 02/25/2023, 12:19 PM

## 2023-03-01 ENCOUNTER — Encounter: Payer: Medicare HMO | Admitting: Student

## 2023-03-02 ENCOUNTER — Ambulatory Visit (INDEPENDENT_AMBULATORY_CARE_PROVIDER_SITE_OTHER): Payer: Medicare HMO | Admitting: Internal Medicine

## 2023-03-02 ENCOUNTER — Encounter (INDEPENDENT_AMBULATORY_CARE_PROVIDER_SITE_OTHER): Payer: Self-pay | Admitting: Internal Medicine

## 2023-03-02 VITALS — BP 124/79 | HR 64 | Temp 98.2°F | Ht 68.0 in | Wt 300.0 lb

## 2023-03-02 DIAGNOSIS — Z299 Encounter for prophylactic measures, unspecified: Secondary | ICD-10-CM | POA: Diagnosis not present

## 2023-03-02 DIAGNOSIS — R7303 Prediabetes: Secondary | ICD-10-CM | POA: Insufficient documentation

## 2023-03-02 DIAGNOSIS — E66813 Obesity, class 3: Secondary | ICD-10-CM

## 2023-03-02 DIAGNOSIS — E88819 Insulin resistance, unspecified: Secondary | ICD-10-CM | POA: Diagnosis not present

## 2023-03-02 DIAGNOSIS — R7401 Elevation of levels of liver transaminase levels: Secondary | ICD-10-CM | POA: Diagnosis not present

## 2023-03-02 DIAGNOSIS — I10 Essential (primary) hypertension: Secondary | ICD-10-CM | POA: Diagnosis not present

## 2023-03-02 DIAGNOSIS — Z6841 Body Mass Index (BMI) 40.0 and over, adult: Secondary | ICD-10-CM

## 2023-03-02 MED ORDER — METFORMIN HCL ER 500 MG PO TB24: 500.0000 mg | ORAL_TABLET | Freq: Every day | ORAL | 0 refills | Status: DC

## 2023-03-02 NOTE — Assessment & Plan Note (Signed)
Patient is up-to-date on age and gender specific cancer preventive measures. Patient also counseled on cardiovascular risk which is 3.6% and low.  At this time she does not benefit from antiplatelet or further lipid or blood pressure lowering.

## 2023-03-02 NOTE — Progress Notes (Signed)
Office: [phone]  /  Fax: [phone]  WEIGHT SUMMARY AND BIOMETRICS  Vitals Temp: 98.2 F (36.8 C) BP: 124/79 Pulse Rate: 64 SpO2: 98 %   Anthropometric Measurements Height: 5\' 8"  (1.727 m) Weight: 300 lb (136.1 kg) BMI (Calculated): 45.63 Weight Lost Since Last Visit: 9 lb Starting Weight: 309 lb Total Weight Loss (lbs): 9 lb (4.082 kg)   No data recorded The 10-year ASCVD risk score (Arnett DK, et al., 2019) is: 3.6%  HPI  Chief Complaint: OBESITY  Ebony Warren is here to discuss her progress with her obesity treatment plan. She is on the the Category 2 Plan and states she is following her eating plan approximately 100 % of the time. She states she is exercising 45 minutes 1 times per week.  Interval History:  Since last office visit she has LOST 9 LBS. She reports good adherence to reduced calorie nutritional plan. She has been working on reading food labels, not skipping meals, drinking more water, avoiding and or reducing liquid calories, making healthier choices, and begun to exercise [x] Denies [] Reports problems with appetite and hunger signals.  [x] Denies [] Reports problems with satiety and satiation.  [x] Denies [] Reports problems with eating patterns and portion control.  [x] Denies [] Reports abnormal cravings   Barriers identified none.   Pharmacotherapy for weight loss: She is currently taking no anti-obesity medication.    ASSESSMENT AND PLAN  TREATMENT PLAN FOR OBESITY:  Recommended Dietary Goals  Ebony Warren is currently in the action stage of change. As such, her goal is to continue weight management plan. She has agreed to: follow the Category 2 plan and continue current plan  Behavioral Intervention  We discussed the following Behavioral Modification Strategies today: increasing lean protein intake, increasing vegetables, increasing water intake, work on meal planning and easy cooking plans, reading food labels , identifying sources and  decreasing liquid calories, planning for success, and keeping healthy foods at home.  Additional resources provided today: None  Recommended Physical Activity Goals  Ebony Warren has been advised to work up to 150 minutes of moderate intensity aerobic activity a week and strengthening exercises 2-3 times per week for cardiovascular health, weight loss maintenance and preservation of muscle mass.   She has agreed to :  Continue going to the gym with emphasis on strengthening.  Pharmacotherapy We discussed various medication options to help Ebony Warren with her weight loss efforts and we both agreed to : continue with nutritional and behavioral strategies  ASSOCIATED CONDITIONS ADDRESSED TODAY  Elevated ALT measurement Assessment & Plan: I reviewed previous trends and her LFTs have been normal.  This could be due to medications, hepatic steatosis.  We will repeat CMP in 4 weeks and if elevated we will complete first tier workup for abnormal liver enzymes and consider ultrasound imaging.   Essential hypertension Assessment & Plan: Blood pressure is at goal for age and risk category.  On losartan, furosemide without adverse effects.  Most recent renal parameters reviewed which showed normal electrolytes and kidney function.  Continue with nutritional and behavioral strategies.  Monitor for symptoms of orthostasis while losing weight. Continue current regimen and home monitoring for a goal blood pressure of 120/80.    Class 3 severe obesity with serious comorbidity and body mass index (BMI) of 45.0 to 49.9 in adult, unspecified obesity type (HCC)  Insulin resistance Assessment & Plan: Her HOMA-IR is 4.8 which is elevated and suggest significant insulin resistance. Optimal level < 1.9. This is complex condition associated with genetics, ectopic fat and lifestyle factors.  Insulin resistance may result in weight gain, abnormal cravings (particularly for carbs) and fatigue. This may result in  additional weight gain and lead to pre-diabetes and diabetes if untreated.   Lab Results  Component Value Date   HGBA1C 5.3 08/19/2022   Lab Results  Component Value Date   INSULIN 25.0 (H) 02/23/2023   Lab Results  Component Value Date   GLUCOSE 78 02/23/2023   GLUCOSE 91 08/30/2014    We reviewed treatment options which include losing 7 to 10% of body weight, increasing physical activity to a 150 minutes a week of moderate intensity.After discussion of benefits and side effects she is agreeable to starting metformin.  This will also help offset weight gain associated with antipsychotic use.   Orders: -     metFORMIN HCl ER; Take 1 tablet (500 mg total) by mouth daily with breakfast.  Dispense: 30 tablet; Refill: 0  Preventive measure Assessment & Plan: Patient is up-to-date on age and gender specific cancer preventive measures. Patient also counseled on cardiovascular risk which is 3.6% and low.  At this time she does not benefit from antiplatelet or further lipid or blood pressure lowering.      PHYSICAL EXAM:  Blood pressure 124/79, pulse 64, temperature 98.2 F (36.8 C), height 5\' 8"  (1.727 m), weight 300 lb (136.1 kg), last menstrual period 02/23/2023, SpO2 98 %. Body mass index is 45.61 kg/m.  General: She is overweight, cooperative, alert, well developed, and in no acute distress. PSYCH: Has normal mood, affect and thought process.   HEENT: EOMI, sclerae are anicteric. Lungs: Normal breathing effort, no conversational dyspnea. Extremities: No edema.  Neurologic: No gross sensory or motor deficits. No tremors or fasciculations noted.    DIAGNOSTIC DATA REVIEWED:  BMET    Component Value Date/Time   NA 140 02/23/2023 1305   K 4.6 02/23/2023 1305   CL 104 02/23/2023 1305   CO2 22 02/23/2023 1305   GLUCOSE 78 02/23/2023 1305   GLUCOSE 85 06/23/2021 1136   BUN 14 02/23/2023 1305   CREATININE 0.85 02/23/2023 1305   CALCIUM 9.5 02/23/2023 1305   GFRNONAA 76  12/10/2020 1251   GFRAA 87 12/10/2020 1251   Lab Results  Component Value Date   HGBA1C 5.3 08/19/2022   Lab Results  Component Value Date   INSULIN 25.0 (H) 02/23/2023   Lab Results  Component Value Date   TSH 1.690 02/23/2023   CBC    Component Value Date/Time   WBC 6.3 10/27/2022 1056   WBC 7.3 08/30/2014 1031   RBC 4.77 10/27/2022 1056   RBC 4.07 08/30/2014 1031   HGB 14.8 10/27/2022 1056   HCT 45.3 10/27/2022 1056   PLT 279 10/27/2022 1056   MCV 95 10/27/2022 1056   MCH 31.0 10/27/2022 1056   MCH 31.4 08/30/2014 1031   MCHC 32.7 10/27/2022 1056   MCHC 33.9 08/30/2014 1031   RDW 12.1 10/27/2022 1056   Iron Studies    Component Value Date/Time   IRON 68 10/27/2022 1056   TIBC 313 10/27/2022 1056   FERRITIN 41 10/27/2022 1056   IRONPCTSAT 22 10/27/2022 1056   Lipid Panel     Component Value Date/Time   CHOL 149 02/23/2023 1305   TRIG 72 02/23/2023 1305   HDL 50 02/23/2023 1305   LDLCALC 85 02/23/2023 1305   Hepatic Function Panel     Component Value Date/Time   PROT 7.0 02/23/2023 1305   ALBUMIN 4.3 02/23/2023 1305   AST 29 02/23/2023 1305  ALT 39 (H) 02/23/2023 1305   ALKPHOS 110 02/23/2023 1305   BILITOT 0.4 02/23/2023 1305      Component Value Date/Time   TSH 1.690 02/23/2023 1305   Nutritional Lab Results  Component Value Date   VD25OH 55.6 02/23/2023   VD25OH 38.1 09/23/2021   VD25OH 34.1 12/22/2016     Return in about 2 weeks (around 03/16/2023) for For Weight Mangement with Dr. Gerarda Fraction.Marland Kitchen She was informed of the importance of frequent follow up visits to maximize her success with intensive lifestyle modifications for her multiple health conditions.   ATTESTASTION STATEMENTS:  Reviewed by clinician on day of visit: allergies, medications, problem list, medical history, surgical history, family history, social history, and previous encounter notes.     Thomes Dinning, MD

## 2023-03-02 NOTE — Assessment & Plan Note (Signed)
Her HOMA-IR is 4.8 which is elevated and suggest significant insulin resistance. Optimal level < 1.9. This is complex condition associated with genetics, ectopic fat and lifestyle factors. Insulin resistance may result in weight gain, abnormal cravings (particularly for carbs) and fatigue. This may result in additional weight gain and lead to pre-diabetes and diabetes if untreated.   Lab Results  Component Value Date   HGBA1C 5.3 08/19/2022   Lab Results  Component Value Date   INSULIN 25.0 (H) 02/23/2023   Lab Results  Component Value Date   GLUCOSE 78 02/23/2023   GLUCOSE 91 08/30/2014    We reviewed treatment options which include losing 7 to 10% of body weight, increasing physical activity to a 150 minutes a week of moderate intensity.After discussion of benefits and side effects she is agreeable to starting metformin.  This will also help offset weight gain associated with antipsychotic use.

## 2023-03-02 NOTE — Assessment & Plan Note (Signed)
Blood pressure is at goal for age and risk category.  On losartan, furosemide without adverse effects.  Most recent renal parameters reviewed which showed normal electrolytes and kidney function.  Continue with nutritional and behavioral strategies.  Monitor for symptoms of orthostasis while losing weight. Continue current regimen and home monitoring for a goal blood pressure of 120/80.

## 2023-03-02 NOTE — Assessment & Plan Note (Signed)
I reviewed previous trends and her LFTs have been normal.  This could be due to medications, hepatic steatosis.  We will repeat CMP in 4 weeks and if elevated we will complete first tier workup for abnormal liver enzymes and consider ultrasound imaging.

## 2023-03-03 NOTE — Progress Notes (Signed)
[facility] PREP Weekly Session  Patient Details  Name: Ebony Warren MRN: [id number] Date of Birth: [birth year] Age: 47 y.o. PCP: Lottie Mussel, MD  Vitals:   03/01/23 1300  Weight: 286 lb (129.7 kg)     [facility] Weekly seesion - 03/03/23 1200       [facility] "PREP" Location   [facility]PREP" [facility]      Weekly Session   Topic Discussed Healthy eating tips    Minutes exercised this week 65 minutes    Classes attended to date 5            Class held on 03/01/23 Barn[place] Hatter 03/03/2023, 12:34 PM

## 2023-03-09 NOTE — Progress Notes (Signed)
[facility] PREP Weekly Session  Patient Details  Name: Ebony Warren MRN: [id number] Date of Birth: 20-Sep-1976 Age: 47 y.o. PCP: Lottie Mussel, MD  There were no vitals filed for this visit.   [facility] Weekly seesion - 03/09/23 1100       [facility] "PREP" Location   [facility]" [facility]      Weekly Session   Topic Discussed Health habits    Minutes exercised this week 105 minutes    Classes attended to date [place] 03/09/2023, 11:13 AM

## 2023-03-23 NOTE — Progress Notes (Signed)
[facility] PREP Weekly Session  Patient Details  Name: Ebony Warren MRN: [id number] Date of Birth: 1976-01-26 Age: 47 y.o. PCP: Mercie Eon, MD  There were no vitals filed for this visit.   [facility] Weekly seesion - 03/23/23 1700       [facility] "PREP" Location   [facility]      Weekly Session   Topic Discussed Stress management and problem solving   relaxation meditation, salt/sugar demos   Minutes exercised this week 90 minutes    Classes attended to date 10             [name] 03/23/2023, 5:08 PM

## 2023-03-23 NOTE — Progress Notes (Unsigned)
[facility]: Clinic Note  Subjective:  History of Present Illness: Ebony Warren is a 47 y.o. year old female who presents for 3 month follow up.  She is doing well. Her mood is good overall. Her cough is better with taking a nasal decongestant, but she's having some dry mouth with this. She had not tried Flonase previously.  She's enjoying her exercises at the [facility] and seeing the [facility]. She has lost 15 pounds and is pleased with this. No longer interested in GLP1, which we were having trouble getting insurance to cover anyway.   Please refer to Assessment and Plan below for full details in Problem-Based Charting.   Past Medical History:  Patient Active Problem List   Diagnosis Date Noted   Healthcare maintenance 03/24/2023   Family history of colon cancer 03/24/2023   Post-nasal drip 03/24/2023   Swelling of lower extremity 03/24/2023   Elevated ALT measurement 03/02/2023   Insulin resistance 03/02/2023   Suspected sleep apnea 02/16/2023   Essential hypertension 12/23/2022   Abnormal menses 10/27/2022   Macromastia 06/23/2021   Gastroesophageal reflux disease 01/22/2021   Class 3 severe obesity with serious comorbidity and body mass index (BMI) of 45.0 to 49.9 in adult 01/22/2021   Generalized anxiety disorder 10/07/2018   Right optic neuritis 05/13/2016   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   Depression 01/27/2016   Absence of bladder continence 01/27/2016   Multiple sclerosis 10/20/2014   Hemiplegia of dominant side (HCC) 08/30/2014     Medications:  Current Outpatient Medications:    ARIPiprazole (ABILIFY) 5 MG tablet, Take 5 mg by mouth daily., Disp: , Rfl:    baclofen (LIORESAL) 20 MG tablet, Take 1 tablet (20 mg total) by mouth 3 (three) times daily., Disp: 270 tablet, Rfl: 4   cholecalciferol (VITAMIN D) 1000 UNITS tablet, Take 1,000 Units by mouth daily., Disp: , Rfl:    diphenhydrAMINE  (BENADRYL) 25 mg capsule, Take 25 mg by mouth every 6 (six) hours as needed for allergies., Disp: , Rfl:    DULoxetine (CYMBALTA) 30 MG capsule, Take 1 capsule by mouth daily., Disp: , Rfl:    gabapentin (NEURONTIN) 300 MG capsule, Take 2 capsules (600 mg total) by mouth 3 (three) times daily., Disp: 540 capsule, Rfl: 4   LORazepam (ATIVAN) 1 MG tablet, Take 1 tablet (1 mg total) by mouth daily., Disp: 14 tablet, Rfl: 0   losartan (COZAAR) 50 MG tablet, Take 1 tablet (50 mg total) by mouth daily., Disp: 90 tablet, Rfl: 3   MELATONIN PO, Take by mouth at bedtime., Disp: , Rfl:    metFORMIN (GLUCOPHAGE-XR) 500 MG 24 hr tablet, Take 1 tablet (500 mg total) by mouth daily with breakfast., Disp: 30 tablet, Rfl: 0   Multiple Vitamins-Minerals (MULTIVITAMIN ADULT PO), Take 1 tablet by mouth daily., Disp: , Rfl:    ocrelizumab (OCREVUS) 300 MG/10ML injection, Infuse 600 mg IV Every 6 months, Disp: 20 mL, Rfl: 1   oxybutynin (DITROPAN-XL) 10 MG 24 hr tablet, Take 1 tablet (10 mg total) by mouth daily as needed (to prevent incontinence)., Disp: 90 tablet, Rfl: 3   pantoprazole (PROTONIX) 40 MG tablet, Take 1 tablet (40 mg total) by mouth daily., Disp: 90 tablet, Rfl: 3   Potassium (POTASSIMIN PO), Take by mouth daily at 12 noon., Disp: , Rfl:    tiZANidine (ZANAFLEX) 4 MG tablet, Take 1 tablet (4 mg total) by mouth 3 (three) times daily., Disp:  270 tablet, Rfl: 4  Current Facility-Administered Medications:    diphenhydrAMINE (BENADRYL) injection 50 mg, 50 mg, Intravenous, Once, Penumalli, Vikram R, MD   famotidine (PEPCID) 20 mg in sodium chloride 0.9 % 50 mL IVPB, 20 mg, Intravenous, Once, Penumalli, Vikram R, MD   methylPREDNISolone sodium succinate (SOLU-MEDROL) 125 mg in sodium chloride 0.9 % 50 mL IVPB, 125 mg, Intravenous, Once, Penumalli, Vikram R, MD   ocrelizumab (OCREVUS) 600 mg in sodium chloride 0.9 % 250 mL, 600 mg, Intravenous, Once, Penumalli, Glenford Bayley, MD   Allergies: Allergies  Allergen  Reactions   Bioflavonoids Hives   Oxycodone-Acetaminophen Itching   Hydrocodone Itching    Stomach cramps   Morphine Itching   Orange Fruit [Citrus]     Acidic foods Makes her itchy and her face "feels like its on fire", have rash.   Oxycodone Itching    rash   Tomato Hives   Other Rash    Narcotics. Spicy foods (cause seizures)     Objective:   Vitals: Vitals:   03/24/23 0824  BP: 118/67  Pulse: 79  Temp: 98.1 F (36.7 C)  SpO2: 98%    Physical Exam: Physical Exam Constitutional:      Appearance: Normal appearance.     Comments: Sitting comfortably in motorized wheelchair  Cardiovascular:     Rate and Rhythm: Normal rate and regular rhythm.     Heart sounds: No murmur heard. Pulmonary:     Effort: Pulmonary effort is normal.     Breath sounds: Normal breath sounds. No wheezing.  Musculoskeletal:     Right lower leg: Edema present.     Left lower leg: Edema present.  Skin:    General: Skin is warm and dry.  Neurological:     Mental Status: She is alert.     Comments: 4/5 strength in RUE, 5/5 strength in LUE, 2/5 strength in RLE, 5/5 strength in LLE      Data: Labs, imaging, and micro were reviewed in Epic. Refer to Assessment and Plan below for full details in Problem-Based Charting.  Assessment & Plan:  Multiple sclerosis - Chronic and stable - Continue Baclofen  TID, Gabapentin  TID, Tizanidine  TID, Oxybutynin  Hemiplegia of dominant side (HCC) - Chronic and stable - Continue using motorized wheelchair  Depression - chronic and stable - Sees Dr Lilian Kapur in [facility] - Continue Cymbalta  daily  Generalized anxiety disorder - chronic and stable - continue ativan  daily, prescribed by Dr. Lilian Kapur  Essential hypertension - Chronic and improving - Continue Losartan  daily  Class 3 severe obesity with serious comorbidity and body mass index (BMI) of 45.0 to 49.9 in adult - Chronic and improving - [facility] - GLP1 was not covered by insurance - STOP BANG score is 4, she is obese, snores, and has excessive daytime fatigue. I recommend screening for OSA with sleep study, which I have ordered  Abnormal menses - Dr. Heide Spark ordered a OB/GYN referral and transvaginal U/S for AUB. She still has 4 days of heavy bleeding on her period, followed by 19 days of spotting. 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  - Dr. Clearance Coots with GYN prescribed megace, but she hasn't started this due to concerns with side effects of weight gain. She continues to bleed. She will need to call his office to schedule a follow up appointment.   Family history of colon cancer -  Patient's great grandfather died of colon cancer, and her family has been aggressive about screening, with multiple members having colonoscopies that found polyps. She would like to get a colonoscopy to screen for CRC now that she is 46, but is unable to prep at home due to her her hemiplegia from MS.  - I will coordinate with GI to see if patient could be admitted to the IMT[facility] for colonoscopy   Post-nasal drip - Start Flonase OTC  Swelling of lower extremity - She has 1+ edema of bilateral lower extremities, symmetric. This is chronic and stable. She sleeps on a hospital bed propped up due to pain from MS, so I don't have a good sense of if she has orthopnea. But her exam is normal, no evidence of CHF. I think LE edema is most likely from venous insufficiency, but I'll get an ECHO if it persists.     Patient will follow up in 6 months   Mercie Eon, MD

## 2023-03-24 ENCOUNTER — Ambulatory Visit (INDEPENDENT_AMBULATORY_CARE_PROVIDER_SITE_OTHER): Payer: Medicare HMO | Admitting: Internal Medicine

## 2023-03-24 VITALS — BP 118/67 | HR 79 | Temp 98.1°F | Ht 68.0 in | Wt 294.0 lb

## 2023-03-24 DIAGNOSIS — I10 Essential (primary) hypertension: Secondary | ICD-10-CM | POA: Diagnosis not present

## 2023-03-24 DIAGNOSIS — R69 Illness, unspecified: Secondary | ICD-10-CM | POA: Diagnosis not present

## 2023-03-24 DIAGNOSIS — N926 Irregular menstruation, unspecified: Secondary | ICD-10-CM | POA: Diagnosis not present

## 2023-03-24 DIAGNOSIS — E661 Drug-induced obesity: Secondary | ICD-10-CM

## 2023-03-24 DIAGNOSIS — G35 Multiple sclerosis: Secondary | ICD-10-CM | POA: Diagnosis not present

## 2023-03-24 DIAGNOSIS — F330 Major depressive disorder, recurrent, mild: Secondary | ICD-10-CM

## 2023-03-24 DIAGNOSIS — Z800 Family history of malignant neoplasm of digestive organs: Secondary | ICD-10-CM

## 2023-03-24 DIAGNOSIS — M7989 Other specified soft tissue disorders: Secondary | ICD-10-CM

## 2023-03-24 DIAGNOSIS — G8190 Hemiplegia, unspecified affecting unspecified side: Secondary | ICD-10-CM

## 2023-03-24 DIAGNOSIS — G8191 Hemiplegia, unspecified affecting right dominant side: Secondary | ICD-10-CM | POA: Diagnosis not present

## 2023-03-24 DIAGNOSIS — R0982 Postnasal drip: Secondary | ICD-10-CM | POA: Diagnosis not present

## 2023-03-24 DIAGNOSIS — R32 Unspecified urinary incontinence: Secondary | ICD-10-CM

## 2023-03-24 DIAGNOSIS — R29818 Other symptoms and signs involving the nervous system: Secondary | ICD-10-CM

## 2023-03-24 DIAGNOSIS — F411 Generalized anxiety disorder: Secondary | ICD-10-CM

## 2023-03-24 DIAGNOSIS — Z6841 Body Mass Index (BMI) 40.0 and over, adult: Secondary | ICD-10-CM

## 2023-03-24 DIAGNOSIS — Z0000 Encounter for general adult medical examination without abnormal findings: Secondary | ICD-10-CM

## 2023-03-24 HISTORY — DX: Other specified soft tissue disorders: M79.89

## 2023-03-24 MED ORDER — LOSARTAN POTASSIUM 50 MG PO TABS: 50.0000 mg | ORAL_TABLET | Freq: Every day | ORAL | 3 refills | Status: DC

## 2023-03-24 NOTE — Assessment & Plan Note (Signed)
-   Chronic and stable - Continue using motorized wheelchair

## 2023-03-24 NOTE — Assessment & Plan Note (Addendum)
-   Chronic and improving - [facility] - [facility] - GLP1 was not covered by insurance - STOP BANG score is 4, she is obese, snores, and has excessive daytime fatigue. I recommend screening for OSA with sleep study, which I have ordered

## 2023-03-24 NOTE — Assessment & Plan Note (Signed)
-   She has 1+ edema of bilateral lower extremities, symmetric. This is chronic and stable. She sleeps on a hospital bed propped up due to pain from MS, so I don't have a good sense of if she has orthopnea. But her exam is normal, no evidence of CHF. I think LE edema is most likely from venous insufficiency, but I'll get an ECHO if it persists.

## 2023-03-24 NOTE — Assessment & Plan Note (Signed)
-   Start Flonase OTC

## 2023-03-24 NOTE — Assessment & Plan Note (Signed)
-   Dr. Heide Spark ordered a OB/GYN referral and transvaginal U/S for AUB. She still has 4 days of heavy bleeding on her period, followed by 19 days of spotting. 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  - Dr. Clearance Coots with GYN prescribed megace, but she hasn't started this due to concerns with side effects of weight gain. She continues to bleed. She will need to call his office to schedule a follow up appointment.

## 2023-03-24 NOTE — Assessment & Plan Note (Signed)
-   Patient's great grandfather died of colon cancer, and her family has been aggressive about screening, with multiple members having colonoscopies that found polyps. She would like to get a colonoscopy to screen for CRC now that she is 46, but is unable to prep at home due to her her hemiplegia from MS.  - I will coordinate with GI to see if patient could be admitted to the IMT[facility] for colonoscopy

## 2023-03-24 NOTE — Assessment & Plan Note (Signed)
-   Chronic and stable - Continue Baclofen 20mg  TID, Gabapentin 600mg  TID, Tizanidine 4mg  TID, Oxybutynin

## 2023-03-24 NOTE — Assessment & Plan Note (Signed)
-   chronic and stable - continue ativan 1mg  daily, prescribed by Dr. Lilian Kapur

## 2023-03-24 NOTE — Patient Instructions (Signed)
Dear Ms Ebony Warren,  It was a pleasure seeing you in clinic today.  For your post nasal drip, I recommend that you also take Flonase (also called fluticasone) over the counter nasal spray. This will help your cough, and I don't think it will cause the dry mouth that your other medicine is causing.  I have placed a referral for a sleep study. They will call you to schedule this.   I am working on scheduling you for a colonoscopy in the hospital. Someone from our office will call to update you on the plan.  Sincerely, Dr. Mercie Eon

## 2023-03-24 NOTE — Assessment & Plan Note (Signed)
-   Chronic and improving - Continue Losartan 50mg  daily

## 2023-03-24 NOTE — Assessment & Plan Note (Signed)
-   chronic and stable - Sees Dr Lilian Kapur in [facility] - Continue Cymbalta  daily

## 2023-03-25 ENCOUNTER — Encounter (INDEPENDENT_AMBULATORY_CARE_PROVIDER_SITE_OTHER): Payer: Self-pay | Admitting: Internal Medicine

## 2023-03-25 ENCOUNTER — Ambulatory Visit (INDEPENDENT_AMBULATORY_CARE_PROVIDER_SITE_OTHER): Payer: Medicare HMO | Admitting: Internal Medicine

## 2023-03-25 VITALS — BP 123/87 | HR 73 | Temp 97.9°F | Ht 68.0 in | Wt 292.0 lb

## 2023-03-25 DIAGNOSIS — R7401 Elevation of levels of liver transaminase levels: Secondary | ICD-10-CM

## 2023-03-25 DIAGNOSIS — I10 Essential (primary) hypertension: Secondary | ICD-10-CM | POA: Diagnosis not present

## 2023-03-25 DIAGNOSIS — Z6841 Body Mass Index (BMI) 40.0 and over, adult: Secondary | ICD-10-CM | POA: Diagnosis not present

## 2023-03-25 DIAGNOSIS — E88819 Insulin resistance, unspecified: Secondary | ICD-10-CM

## 2023-03-25 MED ORDER — METFORMIN HCL ER 500 MG PO TB24: 500.0000 mg | ORAL_TABLET | Freq: Two times a day (BID) | ORAL | 0 refills | Status: DC

## 2023-03-25 NOTE — Assessment & Plan Note (Signed)
I reviewed previous trends and her LFTs had been normal.  This could be due to medications, hepatic steatosis.  We will repeat CMP today.  If liver enzymes are elevated we will complete first tier workup for abnormal liver enzymes and consider ultrasound imaging.

## 2023-03-25 NOTE — Progress Notes (Signed)
Office: [phone]  /  Fax: [phone]  WEIGHT SUMMARY AND BIOMETRICS  Vitals Temp: 97.9 F (36.6 C) BP: 123/87 Pulse Rate: 73 SpO2: 98 %   Anthropometric Measurements Height:  (1.727 m) Weight: 292 lb (132.5 kg) BMI (Calculated): 44.41 Weight at Last Visit: 300 lb Weight Lost Since Last Visit: 8 lb Starting Weight: 309 lb Total Weight Loss (lbs): 17 lb (7.711 kg) Peak Weight: 312 lb   No data recorded  No data recorded Today's Visit #: 3  Starting Date: 02/22/23   HPI  Chief Complaint: OBESITY  Ebony Warren is here to discuss her progress with her obesity treatment plan. She is on the the Category 3 Plan and states she is following her eating plan approximately 50 % of the time. She states she is exercising 30 minutes 2 times per week.  Interval History:  Since last office visit she has lost 8 lbs. She reports good adherence to reduced calorie nutritional plan. She has been working on not skipping meals, increasing protein intake at every meal, eating more fruits, and eating more vegetables Denies problems with appetite and hunger signals.  Denies problems with satiety and satiation.  Denies problems with eating patterns and portion control.  Reports abnormal cravings. Denies feeling deprived or restricted.   Barriers identified: none.   Pharmacotherapy for weight loss: She is currently taking Metformin (off label use for incretin effect and / or insulin resistance and / or diabetes prevention) .    ASSESSMENT AND PLAN  TREATMENT PLAN FOR OBESITY:  Recommended Dietary Goals  [name] is currently in the action stage of change. As such, her goal is to continue weight management plan. She has agreed to: continue current plan  Behavioral Intervention  We discussed the following Behavioral Modification Strategies today: increasing lean protein intake, decreasing simple carbohydrates , increasing vegetables, increasing lower glycemic fruits,  increasing water intake, continue to practice mindfulness when eating, planning for success, and better snacking choices.  Additional resources provided today: None  Recommended Physical Activity Goals  [name] has been advised to work up to 150 minutes of moderate intensity aerobic activity a week and strengthening exercises 2-3 times per week for cardiovascular health, weight loss maintenance and preservation of muscle mass.   She has agreed to :  Continue current level of physical activity   Pharmacotherapy We discussed various medication options to help Loco with her weight loss efforts and we both agreed to : increase metformin to twice a day  ASSOCIATED CONDITIONS ADDRESSED TODAY  Elevated ALT measurement Assessment & Plan: I reviewed previous trends and her LFTs had been normal.  This could be due to medications, hepatic steatosis.  We will repeat CMP today.  If liver enzymes are elevated we will complete first tier workup for abnormal liver enzymes and consider ultrasound imaging.  Orders: -     [id number]+EGFR  Insulin resistance Assessment & Plan: Her HOMA-IR is 4.8 which is elevated and suggest significant insulin resistance. Optimal level < 1.9. This is complex condition associated with genetics, ectopic fat and lifestyle factors. Insulin resistance may result in weight gain, abnormal cravings (particularly for carbs) and fatigue. This may result in additional weight gain and lead to pre-diabetes and diabetes if untreated.   Lab Results  Component Value Date   HGBA1C 5.3 08/19/2022   Lab Results  Component Value Date   INSULIN 25.0 (H) 02/23/2023   Lab Results  Component Value Date   GLUCOSE 78 02/23/2023   GLUCOSE 91 08/30/2014  Patient continues to lose weight with nutritional and behavioral strategies.  She is also tolerating metformin 500 mg XR 1 tablet daily.  We will increase to twice a day for incretin effect.  This will also help reduce cravings for  carbs.   Orders: -     metFORMIN HCl ER; Take 1 tablet (500 mg total) by mouth 2 (two) times daily with a meal.  Dispense: 60 tablet; Refill: 0  Essential hypertension Assessment & Plan: Repeat blood pressure was 123/79 and well-controlled. On losartan, furosemide without adverse effects.  Most recent renal parameters reviewed which showed normal electrolytes and kidney function.  Continue with nutritional and behavioral strategies.  Monitor for symptoms of orthostasis while losing weight. Continue current regimen and home monitoring for a goal blood pressure of 120/80.    Class 3 severe obesity with serious comorbidity and body mass index (BMI) of 45.0 to 49.9 in adult, unspecified obesity type     PHYSICAL EXAM:  Blood pressure 123/87, pulse 73, temperature 97.9 F (36.6 C), height  (1.727 m), weight 292 lb (132.5 kg), last menstrual period 02/23/2023, SpO2 98 %. Body mass index is 44.4 kg/m.  General: She is overweight, cooperative, alert, well developed, and in no acute distress. PSYCH: Has normal mood, affect and thought process.   HEENT: EOMI, sclerae are anicteric. Lungs: Normal breathing effort, no conversational dyspnea. Extremities: No edema.  Neurologic: No gross sensory or motor deficits. No tremors or fasciculations noted.    DIAGNOSTIC DATA REVIEWED:  BMET    Component Value Date/Time   NA 140 02/23/2023 1305   K 4.6 02/23/2023 1305   CL 104 02/23/2023 1305   CO2 22 02/23/2023 1305   GLUCOSE 78 02/23/2023 1305   GLUCOSE 85 06/23/2021 1136   BUN 14 02/23/2023 1305   CREATININE 0.85 02/23/2023 1305   CALCIUM 9.5 02/23/2023 1305   GFRNONAA 76 12/10/2020 1251   GFRAA 87 12/10/2020 1251   Lab Results  Component Value Date   HGBA1C 5.3 08/19/2022   Lab Results  Component Value Date   INSULIN 25.0 (H) 02/23/2023   Lab Results  Component Value Date   TSH 1.690 02/23/2023   CBC    Component Value Date/Time   WBC 6.3 10/27/2022 1056   WBC 7.3  08/30/2014 1031   RBC 4.77 10/27/2022 1056   RBC 4.07 08/30/2014 1031   HGB 14.8 10/27/2022 1056   HCT 45.3 10/27/2022 1056   PLT 279 10/27/2022 1056   MCV 95 10/27/2022 1056   MCH 31.0 10/27/2022 1056   MCH 31.4 08/30/2014 1031   MCHC 32.7 10/27/2022 1056   MCHC 33.9 08/30/2014 1031   RDW 12.1 10/27/2022 1056   Iron Studies    Component Value Date/Time   IRON 68 10/27/2022 1056   TIBC 313 10/27/2022 1056   FERRITIN 41 10/27/2022 1056   IRONPCTSAT 22 10/27/2022 1056   Lipid Panel     Component Value Date/Time   CHOL 149 02/23/2023 1305   TRIG 72 02/23/2023 1305   HDL 50 02/23/2023 1305   LDLCALC 85 02/23/2023 1305   Hepatic Function Panel     Component Value Date/Time   PROT 7.0 02/23/2023 1305   ALBUMIN 4.3 02/23/2023 1305   AST 29 02/23/2023 1305   ALT 39 (H) 02/23/2023 1305   ALKPHOS 110 02/23/2023 1305   BILITOT 0.4 02/23/2023 1305      Component Value Date/Time   TSH 1.690 02/23/2023 1305   Nutritional Lab Results  Component Value  Date   VD25OH 55.6 02/23/2023   VD25OH 38.1 09/23/2021   VD25OH 34.1 12/22/2016     Return in about 3 weeks (around 04/15/2023) for For Weight Mangement with Dr. Rikki Spearing.Marland Kitchen She was informed of the importance of frequent follow up visits to maximize her success with intensive lifestyle modifications for her multiple health conditions.   ATTESTASTION STATEMENTS:  Reviewed by clinician on day of visit: allergies, medications, problem list, medical history, surgical history, family history, social history, and previous encounter notes.     Worthy Rancher, MD

## 2023-03-25 NOTE — Assessment & Plan Note (Signed)
Her HOMA-IR is 4.8 which is elevated and suggest significant insulin resistance. Optimal level < 1.9. This is complex condition associated with genetics, ectopic fat and lifestyle factors. Insulin resistance may result in weight gain, abnormal cravings (particularly for carbs) and fatigue. This may result in additional weight gain and lead to pre-diabetes and diabetes if untreated.   Lab Results  Component Value Date   HGBA1C 5.3 08/19/2022   Lab Results  Component Value Date   INSULIN 25.0 (H) 02/23/2023   Lab Results  Component Value Date   GLUCOSE 78 02/23/2023   GLUCOSE 91 08/30/2014    Patient continues to lose weight with nutritional and behavioral strategies.  She is also tolerating metformin 500 mg XR 1 tablet daily.  We will increase to twice a day for incretin effect.  This will also help reduce cravings for carbs.

## 2023-03-25 NOTE — Assessment & Plan Note (Signed)
Repeat blood pressure was 123/79 and well-controlled. On losartan, furosemide without adverse effects.  Most recent renal parameters reviewed which showed normal electrolytes and kidney function.  Continue with nutritional and behavioral strategies.  Monitor for symptoms of orthostasis while losing weight. Continue current regimen and home monitoring for a goal blood pressure of 120/80.

## 2023-03-27 LAB — CMP14+EGFR
ALT: 38 IU/L — ABNORMAL HIGH (ref 0–32)
AST: 21 IU/L (ref 0–40)
Albumin/Globulin Ratio: 1.6 (ref 1.2–2.2)
Albumin: 4.1 g/dL (ref 3.9–4.9)
Alkaline Phosphatase: 97 IU/L (ref 44–121)
BUN/Creatinine Ratio: 14 (ref 9–23)
BUN: 12 mg/dL (ref 6–24)
Bilirubin Total: 0.2 mg/dL (ref 0.0–1.2)
CO2: 20 mmol/L (ref 20–29)
Calcium: 9.5 mg/dL (ref 8.7–10.2)
Chloride: 102 mmol/L (ref 96–106)
Creatinine, Ser: 0.86 mg/dL (ref 0.57–1.00)
Globulin, Total: 2.6 g/dL (ref 1.5–4.5)
Glucose: 90 mg/dL (ref 70–99)
Potassium: 4.4 mmol/L (ref 3.5–5.2)
Sodium: 141 mmol/L (ref 134–144)
Total Protein: 6.7 g/dL (ref 6.0–8.5)
eGFR: 85 mL/min/{1.73_m2} (ref >59)

## 2023-03-29 ENCOUNTER — Telehealth: Payer: Self-pay | Admitting: Internal Medicine

## 2023-03-29 NOTE — Telephone Encounter (Signed)
Contacted Ebony Warren to schedule their annual wellness visit. Appointment made for 04/14/23.  Rudell Cobb AWV direct phone # [phone]

## 2023-03-31 DIAGNOSIS — H40023 Open angle with borderline findings, high risk, bilateral: Secondary | ICD-10-CM | POA: Diagnosis not present

## 2023-03-31 DIAGNOSIS — G35 Multiple sclerosis: Secondary | ICD-10-CM | POA: Diagnosis not present

## 2023-03-31 DIAGNOSIS — H47293 Other optic atrophy, bilateral: Secondary | ICD-10-CM | POA: Diagnosis not present

## 2023-04-01 DIAGNOSIS — F330 Major depressive disorder, recurrent, mild: Secondary | ICD-10-CM | POA: Diagnosis not present

## 2023-04-01 DIAGNOSIS — F411 Generalized anxiety disorder: Secondary | ICD-10-CM | POA: Diagnosis not present

## 2023-04-06 NOTE — Progress Notes (Signed)
[employer] PREP Weekly Session  Patient Details  Name: Ebony Warren MRN: [id number] Date of Birth: [birth year] Age: 47 y.o. PCP: Mercie Eon, MD  Vitals:   04/05/23 1300  Weight: 282 lb (127.9 kg)     [employer] Weekly seesion - 04/06/23 1400       [employer] "PREP" Location   [facility] [employer]      Weekly Session   Topic Discussed Other    Minutes exercised this week 115 minutes    Classes attended to date 5             Pam Jerral Bonito 04/06/2023, 2:02 PM

## 2023-04-15 ENCOUNTER — Telehealth: Payer: Self-pay | Admitting: Internal Medicine

## 2023-04-15 NOTE — Telephone Encounter (Signed)
Contacted Ebony Warren to schedule their annual wellness visit. Appointment made for 05/05/2023.  [facility] Care Guide [facility] AWV TEAM Direct Dial: [phone]

## 2023-04-15 NOTE — Progress Notes (Signed)
[employer] PREP Weekly Session  Patient Details  Name: Ebony Warren MRN: [id number] Date of Birth: 28-Feb-1976 Age: 47 y.o. PCP: Mercie Eon, MD  There were no vitals filed for this visit.   [employer] Weekly seesion - 04/15/23 1100       [employer] "PREP" Location   [employer] [facility] [employer] [facility] [employer]      Weekly Session   Topic Discussed Finding support    Minutes exercised this week 90 minutes    Classes attended to date 13             Pam Jerral Bonito 04/15/2023, 11:33 AM

## 2023-04-16 ENCOUNTER — Encounter: Payer: Self-pay | Admitting: Diagnostic Neuroimaging

## 2023-04-16 DIAGNOSIS — R29898 Other symptoms and signs involving the musculoskeletal system: Secondary | ICD-10-CM

## 2023-04-16 DIAGNOSIS — G35 Multiple sclerosis: Secondary | ICD-10-CM

## 2023-04-16 DIAGNOSIS — E559 Vitamin D deficiency, unspecified: Secondary | ICD-10-CM

## 2023-04-19 ENCOUNTER — Other Ambulatory Visit (INDEPENDENT_AMBULATORY_CARE_PROVIDER_SITE_OTHER): Payer: Self-pay | Admitting: Internal Medicine

## 2023-04-19 ENCOUNTER — Encounter (INDEPENDENT_AMBULATORY_CARE_PROVIDER_SITE_OTHER): Payer: Self-pay | Admitting: Internal Medicine

## 2023-04-19 DIAGNOSIS — E88819 Insulin resistance, unspecified: Secondary | ICD-10-CM

## 2023-04-21 ENCOUNTER — Encounter: Payer: Self-pay | Admitting: *Deleted

## 2023-04-21 NOTE — Progress Notes (Signed)
[facility] Quality Team Note  Name: Ebony Warren Date of Birth: [birth year] MRN: [id number] Date: 04/21/2023  [facility] Quality Team has reviewed this patient's chart, please see recommendations below:  [facility] Quality Other; Pt has open gap for A1C.  Pt has phone visit on 05/05/23.  Do you think an A1C could be ordered?  If not, we just need it completed before the end of 2024 to try to close gap.

## 2023-04-22 ENCOUNTER — Encounter (INDEPENDENT_AMBULATORY_CARE_PROVIDER_SITE_OTHER): Payer: Self-pay | Admitting: Family Medicine

## 2023-04-22 ENCOUNTER — Ambulatory Visit (INDEPENDENT_AMBULATORY_CARE_PROVIDER_SITE_OTHER): Payer: Medicare HMO | Admitting: Family Medicine

## 2023-04-22 ENCOUNTER — Ambulatory Visit (INDEPENDENT_AMBULATORY_CARE_PROVIDER_SITE_OTHER): Payer: Medicare HMO | Admitting: Internal Medicine

## 2023-04-22 VITALS — BP 106/73 | HR 76 | Temp 97.5°F | Ht 68.0 in | Wt 282.0 lb

## 2023-04-22 DIAGNOSIS — R7401 Elevation of levels of liver transaminase levels: Secondary | ICD-10-CM | POA: Diagnosis not present

## 2023-04-22 DIAGNOSIS — Z6841 Body Mass Index (BMI) 40.0 and over, adult: Secondary | ICD-10-CM | POA: Diagnosis not present

## 2023-04-22 DIAGNOSIS — I10 Essential (primary) hypertension: Secondary | ICD-10-CM | POA: Diagnosis not present

## 2023-04-22 DIAGNOSIS — E88819 Insulin resistance, unspecified: Secondary | ICD-10-CM | POA: Diagnosis not present

## 2023-04-22 MED ORDER — METFORMIN HCL ER 500 MG PO TB24: 500.0000 mg | ORAL_TABLET | Freq: Two times a day (BID) | ORAL | 0 refills | Status: DC

## 2023-04-22 NOTE — Progress Notes (Signed)
Ebony Warren, D.O.  ABFM, ABOM Specializing in Clinical Bariatric Medicine  Office located at: [address] [place], [place]  [address]     Assessment and Plan:   Medications Discontinued During This Encounter  Medication Reason   metFORMIN (GLUCOPHAGE-XR) 500 MG 24 hr tablet Reorder     Meds ordered this encounter  Medications   metFORMIN (GLUCOPHAGE-XR) 500 MG 24 hr tablet    Sig: Take 1 tablet (500 mg total) by mouth 2 (two) times daily with a meal.    Dispense:  60 tablet    Refill:  0     Insulin resistance Assessment: Condition is not optimized.  Lab Results  Component Value Date   HGBA1C 5.3 08/19/2022   INSULIN 25.0 (H) 02/23/2023  - No issues with Metformin XR 500 mg BID. Denies any side effects, tolerating well.  - Endorses having occasional emotional cravings for sweets and cravings.   Plan: - Continue with med as recommended by Dr.Maldonado. Will refill this today.   Ebony Warren will continue to work on weight loss, exercise, via their meal plan we devised to help decrease the risk of progressing to diabetes.    Essential hypertension Assessment: Condition is stable. Last 3 blood pressure readings in our office are as follows: BP Readings from Last 3 Encounters:  04/22/23 106/73  03/25/23 123/87  03/24/23 118/67  - Her blood pressure is stable. Asymptomatic, no concerns.  - She does not check her blood pressure often at home.  - No issues with Losartan 50 mg daily. Denies any side effects.    Plan: - Continue with antihypertensive medication as recommended by PCP.  - Ambulatory blood pressure monitoring encouraged.  Reminded patient that if they ever feel poorly in any way, to check their blood pressure and pulse as well. - We will continue to monitor closely alongside PCP/ specialists.  Pt reminded to also f/up with those individuals as instructed by them.  - We will continue to monitor symptoms as they relate to the her weight loss  journey.    Elevated ALT measurement Assessment: Condition is stable. Labs were reviewed with patient today and education provided on them..  All of the patient's questions about them were answered   Lab Results  Component Value Date   ALT 38 (H) 03/25/2023  - Since the last time this was checked, the pt was heavier than she is now. She also endorses that she has been started on 3 medications in the last 6 months.  - These labs from 03/25/23 most likely indicate that they are the result of side effects from the meds and not fatty liver.  Plan: - Pt will discuss this further with Dr. Rikki Spearing on her next OV.   TREATMENT PLAN FOR OBESITY: Class 3 severe obesity with serious comorbidity and body mass index (BMI) of 45.0 to 49.9 in adult, unspecified obesity type [facility]) Assessment:  Ebony Warren is here to discuss her progress with her obesity treatment plan along with follow-up of her obesity related diagnoses. See Medical Weight Management Flowsheet for complete bioelectrical impedance results.  Condition is improving, but not optimized.  Pt on wheel chair, thus biometrics were not obtained.   Weight change since last OV: - 10 lbs.   Total lbs lost to date: 27 Total weight loss percentage to date: 6.62   Plan:  - Ebony Warren will work on healthier eating habits and change from Category 3 to Category 2 meal plan.   Behavioral  Intervention Additional resources provided today: patient declined Evidence-based interventions for health behavior change were utilized today including the discussion of self monitoring techniques, problem-solving barriers and SMART goal setting techniques.   Regarding patient's less desirable eating habits and patterns, we employed the technique of small changes.  Pt will specifically work on: continue adherence to prudent nutritional plan for next visit.    Recommended Physical Activity Goals  Ebony Warren has been advised to slowly work up to 150  minutes of moderate intensity aerobic activity a week and strengthening exercises 2-3 times per week for cardiovascular health, weight loss maintenance and preservation of muscle mass.   She has agreed to Continue current level of physical activity   FOLLOW UP: Return in about 3 weeks (around 05/13/2023). She was informed of the importance of frequent follow up visits to maximize her success with intensive lifestyle modifications for her multiple health conditions.  Subjective:   Chief complaint: Obesity Ebony Warren is here to discuss her progress with her obesity treatment plan. She is on the the Category 2 Plan and states she is mostly following her eating plan. She states she is exercising (cardio and strength) 30 minutes 2 days per week.  Interval History:  Ebony Warren is here for a follow up office visit.     Since last office visit:  - She is mostly following the meal plan and eating all her proteins. - Endorses occasionally deviating from the meal plan on dinner.  - When eating on plan, her hunger and cravings are well controlled.    - Occasionally, she wants something sweet after lunch and dinner.  -  Her RMR was 1426 on 02/16/23  Pharmacotherapy for weight loss: She is currently taking  Metformin  for medical weight loss.  Denies side effects.    Review of Systems:  Pertinent positives were addressed with patient today.  Weight Summary and Biometrics   Weight Lost Since Last Visit: 10 lb  No data recorded   Vitals Temp: (!) 97.5 F (36.4 C) BP: 106/73 Pulse Rate: 76 SpO2: 97 %   Anthropometric Measurements Height: 5\' 8"  (1.727 m) Weight: 282 lb (127.9 kg) BMI (Calculated): 42.89 Weight at Last Visit: 292 lb Weight Lost Since Last Visit: 10 lb Starting Weight: 309 lb Total Weight Loss (lbs): 27 lb (12.2 kg) Peak Weight: 312 lb   No data recorded Other Clinical Data Fasting: no Labs: no Today's Visit #: 4 Starting Date:  02/22/23   Objective:   PHYSICAL EXAM: Blood pressure 106/73, pulse 76, temperature (!) 97.5 F (36.4 C), height 5\' 8"  (1.727 m), weight 282 lb (127.9 kg), SpO2 97 %. Body mass index is 42.88 kg/m.  General: Well Developed, well nourished, and in no acute distress.  HEENT: Normocephalic, atraumatic Skin: Warm and dry, cap RF less 2 sec, good turgor Chest:  Normal excursion, shape, no gross abn Respiratory: speaking in full sentences, no conversational dyspnea NeuroM-Sk: Ambulates w/o assistance, moves * 4 Psych: A and O *3, insight good, mood-full  DIAGNOSTIC DATA REVIEWED:  BMET    Component Value Date/Time   NA 141 03/25/2023 1434   K 4.4 03/25/2023 1434   CL 102 03/25/2023 1434   CO2 20 03/25/2023 1434   GLUCOSE 90 03/25/2023 1434   GLUCOSE 85 06/23/2021 1136   BUN 12 03/25/2023 1434   CREATININE 0.86 03/25/2023 1434   CALCIUM 9.5 03/25/2023 1434   GFRNONAA 76 12/10/2020 1251   GFRAA 87 12/10/2020 1251   Lab Results  Component Value  Date   HGBA1C 5.3 08/19/2022   Lab Results  Component Value Date   INSULIN 25.0 (H) 02/23/2023   Lab Results  Component Value Date   TSH 1.690 02/23/2023   CBC    Component Value Date/Time   WBC 6.3 10/27/2022 1056   WBC 7.3 08/30/2014 1031   RBC 4.77 10/27/2022 1056   RBC 4.07 08/30/2014 1031   HGB 14.8 10/27/2022 1056   HCT 45.3 10/27/2022 1056   PLT 279 10/27/2022 1056   MCV 95 10/27/2022 1056   MCH 31.0 10/27/2022 1056   MCH 31.4 08/30/2014 1031   MCHC 32.7 10/27/2022 1056   MCHC 33.9 08/30/2014 1031   RDW 12.1 10/27/2022 1056   Iron Studies    Component Value Date/Time   IRON 68 10/27/2022 1056   TIBC 313 10/27/2022 1056   FERRITIN 41 10/27/2022 1056   IRONPCTSAT 22 10/27/2022 1056   Lipid Panel     Component Value Date/Time   CHOL 149 02/23/2023 1305   TRIG 72 02/23/2023 1305   HDL 50 02/23/2023 1305   LDLCALC 85 02/23/2023 1305   Hepatic Function Panel     Component Value Date/Time   PROT 6.7  03/25/2023 1434   ALBUMIN 4.1 03/25/2023 1434   AST 21 03/25/2023 1434   ALT 38 (H) 03/25/2023 1434   ALKPHOS 97 03/25/2023 1434   BILITOT 0.2 03/25/2023 1434      Component Value Date/Time   TSH 1.690 02/23/2023 1305   Nutritional Lab Results  Component Value Date   VD25OH 55.6 02/23/2023   VD25OH 38.1 09/23/2021   VD25OH 34.1 12/22/2016    Attestations:   Reviewed by clinician on day of visit: allergies, medications, problem list, medical history, surgical history, family history, social history, and previous encounter notes.   I,Special Puri,acting as a Neurosurgeon for [employer], DO.,have documented all relevant documentation on the behalf of Thomasene Lot, DO,as directed by  Thomasene Lot, DO while in the presence of Thomasene Lot, DO.   I, Thomasene Lot, DO, have reviewed all documentation for this visit. The documentation on 04/22/23 for the exam, diagnosis, procedures, and orders are all accurate and complete.

## 2023-04-27 ENCOUNTER — Ambulatory Visit (INDEPENDENT_AMBULATORY_CARE_PROVIDER_SITE_OTHER): Payer: Medicare HMO | Admitting: Adult Health

## 2023-04-27 ENCOUNTER — Institutional Professional Consult (permissible substitution): Payer: Medicare HMO | Admitting: Neurology

## 2023-04-27 NOTE — Progress Notes (Signed)
[facility] PRE[facility] Weekly Session  Patient Details  Name: Ebony Warren MRN: [id number] Date of Birth: 03-Jun-1976 Age: 47 y.o. PCP: Mercie Eon, MD  Vitals:   04/26/23 1300  Weight: 272 lb (123.4 kg)     [facility] Weekly seesion - 04/27/23 1200       [facility] "PREP" Location   [facility]" [facility]      Weekly Session   Topic Discussed Hitting roadblocks    Minutes exercised this week 70 minutes    Classes attended to date 15             Pam Jerral Bonito 04/27/2023, 12:11 PM

## 2023-05-05 ENCOUNTER — Ambulatory Visit (INDEPENDENT_AMBULATORY_CARE_PROVIDER_SITE_OTHER): Payer: Medicare HMO

## 2023-05-05 ENCOUNTER — Encounter (INDEPENDENT_AMBULATORY_CARE_PROVIDER_SITE_OTHER): Payer: Self-pay | Admitting: Internal Medicine

## 2023-05-05 VITALS — Wt 272.0 lb

## 2023-05-05 DIAGNOSIS — Z0000 Encounter for general adult medical examination without abnormal findings: Secondary | ICD-10-CM

## 2023-05-05 NOTE — Progress Notes (Signed)
Subjective:   Ebony Warren is a 47 y.o. female who presents for Medicare Annual (Subsequent) preventive examination.  Review of Systems    I connected with  Ebony Warren on 05/05/23 by a audio enabled telemedicine application and verified that I am speaking with the correct person using two identifiers.  Patient Location: Home  Provider Location: Home Office  I discussed the limitations of evaluation and management by telemedicine. The patient expressed understanding and agreed to proceed.  Cardiac Risk Factors include: advanced age (>50men, >37 women);hypertension     Objective:    Today's Vitals   05/05/23 0822  Weight: 272 lb (123.4 kg)   Body mass index is 41.36 kg/m.     05/05/2023    8:30 AM 03/24/2023    9:19 AM 01/27/2023    1:20 PM 08/19/2022   10:59 AM 06/12/2022   12:20 PM 02/04/2022   11:16 AM 07/03/2021    4:17 PM  Advanced Directives  Does Patient Have a Medical Advance Directive? Yes Yes Yes Yes Yes Yes Yes  Type of Estate agent of [place];Living will Healthcare Power of [place];Living will Healthcare Power of [place];Living will Healthcare Power of [place];Living will Healthcare Power of [employer] of [place];Living will Healthcare Power of Attorney  Does patient want to make changes to medical advance directive?  No - Patient declined No - Patient declined No - Patient declined No - Patient declined    Copy of Healthcare Power of Attorney in Chart? No - copy requested No - copy requested No - copy requested No - copy requested No - copy requested No - copy requested     Current Medications (verified) Outpatient Encounter Medications as of 05/05/2023  Medication Sig   ARIPiprazole (ABILIFY) 5 MG tablet Take 5 mg by mouth daily.   baclofen (LIORESAL) 20 MG tablet Take 1 tablet (20 mg total) by mouth 3 (three) times daily.   cholecalciferol (VITAMIN D) 1000 UNITS tablet Take 1,000 Units by  mouth daily.   diphenhydrAMINE (BENADRYL) 25 mg capsule Take 25 mg by mouth every 6 (six) hours as needed for allergies.   DULoxetine (CYMBALTA) 30 MG capsule Take 1 capsule by mouth daily.   gabapentin (NEURONTIN) 300 MG capsule Take 2 capsules (600 mg total) by mouth 3 (three) times daily.   LORazepam (ATIVAN) 1 MG tablet Take 1 tablet (1 mg total) by mouth daily.   losartan (COZAAR) 50 MG tablet Take 1 tablet (50 mg total) by mouth daily.   MELATONIN PO Take by mouth at bedtime.   metFORMIN (GLUCOPHAGE-XR) 500 MG 24 hr tablet Take 1 tablet (500 mg total) by mouth 2 (two) times daily with a meal.   Multiple Vitamins-Minerals (MULTIVITAMIN ADULT PO) Take 1 tablet by mouth daily.   ocrelizumab (OCREVUS) 300 MG/10ML injection Infuse 600 mg IV Every 6 months   oxybutynin (DITROPAN-XL) 10 MG 24 hr tablet Take 1 tablet (10 mg total) by mouth daily as needed (to prevent incontinence).   pantoprazole (PROTONIX) 40 MG tablet Take 1 tablet (40 mg total) by mouth daily.   Potassium (POTASSIMIN PO) Take by mouth daily at 12 noon.   tiZANidine (ZANAFLEX) 4 MG tablet Take 1 tablet (4 mg total) by mouth 3 (three) times daily.   Facility-Administered Encounter Medications as of 05/05/2023  Medication   diphenhydrAMINE (BENADRYL) injection 50 mg   famotidine (PEPCID) 20 mg in sodium chloride 0.9 % 50 mL IVPB   methylPREDNISolone sodium succinate (SOLU-MEDROL) 125 mg in  sodium chloride 0.9 % 50 mL IVPB   ocrelizumab (OCREVUS) 600 mg in sodium chloride 0.9 % 250 mL    Allergies (verified) Bioflavonoids, Oxycodone-acetaminophen, Hydrocodone, Morphine, Orange fruit [citrus], Oxycodone, Tomato, and Other   History: Past Medical History:  Diagnosis Date   ADD (attention deficit disorder)    Anxiety and depression    Arthritis    Bradycardia    Constipation    Edema of both lower extremities    Encounter to establish care 01/22/2021   Family history of adverse reaction to anesthesia    nausea   Food  allergy    Gallbladder problem    GERD (gastroesophageal reflux disease)    Headache    Hypertension    Multiple sclerosis (HCC)    Obesity    Osteoarthritis    Screening mammogram, encounter for 01/22/2021   Seizures (HCC)    told that they are not from the brain but stress related   Vitamin D deficiency    Past Surgical History:  Procedure Laterality Date   BREAST REDUCTION SURGERY Bilateral 06/23/2021   Procedure: MAMMARY REDUCTION  (BREAST);  Surgeon: Allena Napoleon, MD;  Location: [facility] OR;  Service: Plastics;  Laterality: Bilateral;   CHOLECYSTECTOMY     KNEE SURGERY Bilateral    Arthroscopy   Family History  Problem Relation Age of Onset   Hypertension Mother    Atrial fibrillation Mother    Thyroid disease Mother    Cancer Mother    Obesity Mother    Hypertension Father    Atrial fibrillation Maternal Grandmother    Social History   Socioeconomic History   Marital status: Single    Spouse name: Not on file   Number of children: 1   Years of education: College   Highest education level: Not on file  Occupational History    Employer: OTHER    Comment: disabled  Tobacco Use   Smoking status: Former    Packs/day: 0.35    Years: 3.00    Additional pack years: 0.00    Total pack years: 1.05    Types: Cigarettes    Quit date: 12/07/2006    Years since quitting: 16.4   Smokeless tobacco: Never  Vaping Use   Vaping Use: Never used  Substance and Sexual Activity   Alcohol use: No    Alcohol/week: 0.0 standard drinks of alcohol    Comment: quit: 2013 (socially)   Drug use: Not Currently    Types: Marijuana    Comment: Smokes approx 1-2 per day   Sexual activity: Not Currently  Other Topics Concern   Not on file  Social History Narrative   ** Merged History Encounter **       Patient lives at home with family. Caffeine Use: drinks a cup of hot tea with tecfidera in the am Patient is right handed. Patient has a college education   Social Determinants of  Health   Financial Resource Strain: Patient Declined (05/05/2023)   Overall Financial Resource Strain (CARDIA)    Difficulty of Paying Living Expenses: Patient declined  Food Insecurity: No Food Insecurity (05/05/2023)   Hunger Vital Sign    Worried About Running Out of Food in the Last Year: Never true    Ran Out of Food in the Last Year: Never true  Transportation Needs: Unmet Transportation Needs (05/05/2023)   PRAPARE - Administrator, Civil Service (Medical): Yes    Lack of Transportation (Non-Medical): Yes  Physical Activity: Insufficiently Active (  05/05/2023)   Exercise Vital Sign    Days of Exercise per Week: 7 days    Minutes of Exercise per Session: 20 min  Stress: No Stress Concern Present (05/05/2023)   [employer] of Occupational Health - Occupational Stress Questionnaire    Feeling of Stress : Not at all  Social Connections: Socially Integrated (05/05/2023)   Social Connection and Isolation Panel [NHANES]    Frequency of Communication with Friends and Family: More than three times a week    Frequency of Social Gatherings with Friends and Family: More than three times a week    Attends Religious Services: More than 4 times per year    Active Member of [employer] or Organizations: Yes    Attends Engineer, structural: More than 4 times per year    Marital Status: Married    Tobacco Counseling Counseling given: Yes   Clinical Intake:  Pre-visit preparation completed: Yes  Pain : No/denies pain     BMI - recorded: 41.36 Nutritional Status: BMI > 30  Obese Nutritional Risks: None Diabetes: No  How often do you need to have someone help you when you read instructions, pamphlets, or other written materials from your doctor or pharmacy?: 1 - Never  Diabetic?NO   Interpreter Needed?: No  Information entered by :: Fredirick Maudlin   Activities of Daily Living    05/05/2023    8:32 AM 03/24/2023    9:18 AM  In your present state of  health, do you have any difficulty performing the following activities:  Hearing? 1 0  Vision? 0 0  Difficulty concentrating or making decisions? 1 0  Walking or climbing stairs? 1 1  Dressing or bathing? 1 1  Doing errands, shopping? 1 1  Preparing Food and eating ? Y   Using the Toilet? N   In the past six months, have you accidently leaked urine? N   Do you have problems with loss of bowel control? N   Managing your Medications? N   Managing your Finances? Y   Housekeeping or managing your Housekeeping? Y     Patient Care Team: Mercie Eon, MD as PCP - General (Internal Medicine)  Indicate any recent Medical Services you may have received from other than [facility] providers in the past year (date may be approximate).     Assessment:   This is a routine wellness examination for [place].  Hearing/Vision screen Hearing Screening - Comments:: Hearing aides in both ears  Vision Screening - Comments:: Wears rx glasses - up to date with routine eye exams with  [facility]   Dietary issues and exercise activities discussed: Current Exercise Habits: Structured exercise class, Type of exercise: stretching, Time (Minutes): 60, Frequency (Times/Week): 2, Weekly Exercise (Minutes/Week): 120, Intensity: Moderate   Goals Addressed   None   Depression Screen    05/05/2023    8:27 AM 03/24/2023    8:59 AM 01/27/2023    1:20 PM 12/17/2022    9:50 AM 10/27/2022    9:37 AM 08/19/2022   11:01 AM 06/12/2022   12:21 PM  PHQ 2/9 Scores  PHQ - 2 Score 0 0 0 0 0 0 0  PHQ- 9 Score 0 5  10 7       Fall Risk    05/05/2023    8:31 AM 03/24/2023    9:18 AM 01/27/2023    1:19 PM 12/23/2022    9:30 AM 10/27/2022    9:37 AM  Fall Risk   Falls  in the past year? 1 1 1 1  0  Number falls in past yr: 1 0 0 0   Injury with Fall? 1 0 0 0   Risk for fall due to : History of fall(s);Impaired balance/gait;Orthopedic patient Impaired balance/gait Impaired mobility;Impaired balance/gait Impaired mobility  Impaired mobility;Impaired balance/gait  Follow up Education provided;Falls prevention discussed;Falls evaluation completed Falls evaluation completed Falls evaluation completed;Falls prevention discussed Falls evaluation completed;Education provided Falls evaluation completed    FALL RISK PREVENTION PERTAINING TO THE HOME:  Any stairs in or around the home? No  If so, are there any without handrails? No  Home free of loose throw rugs in walkways, pet beds, electrical cords, etc? No  Adequate lighting in your home to reduce risk of falls? Yes   ASSISTIVE DEVICES UTILIZED TO PREVENT FALLS:  Life alert? No  Use of a cane, walker or w/c? Yes  Grab bars in the bathroom? No  Shower chair or bench in shower? Yes  Elevated toilet seat or a handicapped toilet?  Bed side commode   TIMED UP AND GO:  Was the test performed?  NO televisit  .    Cognitive Function:    10/25/2018   11:35 AM  MMSE - Mini Mental State Exam  Orientation to time 5  Orientation to Place 5  Registration 3  Attention/ Calculation 4  Recall 1  Language- name 2 objects 2  Language- repeat 1  Language- follow 3 step command 3  Language- read & follow direction 1  Write a sentence 1  Copy design 1  Total score 27        05/05/2023    8:28 AM 06/12/2022   12:23 PM  6CIT Screen  What Year? 0 points 0 points  What month? 0 points 0 points  What time? 0 points 0 points  Count back from 20 0 points 0 points  Months in reverse 0 points 0 points  Repeat phrase 0 points 0 points  Total Score 0 points 0 points    Immunizations Immunization History  Administered Date(s) Administered   Influenza,inj,Quad PF,6+ Mos 11/16/2016   Tdap 05/01/2021    TDAP status: Up to date  Flu Vaccine status: Declined, Education has been provided regarding the importance of this vaccine but patient still declined. Advised may receive this vaccine at local pharmacy or Health Dept. Aware to provide a copy of the vaccination  record if obtained from local pharmacy or Health Dept. Verbalized acceptance and understanding.  Pneumococcal vaccine status: Declined,  Education has been provided regarding the importance of this vaccine but patient still declined. Advised may receive this vaccine at local pharmacy or Health Dept. Aware to provide a copy of the vaccination record if obtained from local pharmacy or Health Dept. Verbalized acceptance and understanding.   Covid-19 vaccine status: Information provided on how to obtain vaccines.   Qualifies for Shingles Vaccine? No   Zostavax completed No   Shingrix Completed?: No.    Education has been provided regarding the importance of this vaccine. Patient has been advised to call insurance company to determine out of pocket expense if they have not yet received this vaccine. Advised may also receive vaccine at local pharmacy or Health Dept. Verbalized acceptance and understanding.  Screening Tests Health Maintenance  Topic Date Due   COVID-19 Vaccine (1) Never done   Hepatitis C Screening  Never done   Colonoscopy  Never done   Medicare Annual Wellness (AWV)  05/04/2024   MAMMOGRAM  06/19/2024  PAP SMEAR-Modifier  12/17/2025   DTaP/Tdap/Td (2 - Td or Tdap) 05/02/2031   HIV Screening  Completed   HPV VACCINES  Aged Out   INFLUENZA VACCINE  Discontinued    Health Maintenance  Health Maintenance Due  Topic Date Due   COVID-19 Vaccine (1) Never done   Hepatitis C Screening  Never done   Colonoscopy  Never done    Colorectal cancer screening: Referral to GI placed for 06/24. Pt aware the office will call re: appt.  Mammogram status: Completed 06/19/22. Repeat every year    Lung Cancer Screening: (Low Dose CT Chest recommended if Age 55-80 years, 30 pack-year currently smoking OR have quit w/in 15years.) does not qualify.   Lung Cancer Screening Referral: NO  Additional Screening:  Hepatitis C Screening: does not qualify; due age   Vision Screening:  Recommended annual ophthalmology exams for early detection of glaucoma and other disorders of the eye. Is the patient up to date with their annual eye exam?  Yes  Who is the provider or what is the name of the office in which the patient attends annual eye exams? [facility] If pt is not established with a provider, would they like to be referred to a provider to establish care? No .   Dental Screening: Recommended annual dental exams for proper oral hygiene  Community Resource Referral / Chronic Care Management: CRR required this visit?  No   CCM required this visit?  No      Plan:     I have personally reviewed and noted the following in the patient's chart:   Medical and social history Use of alcohol, tobacco or illicit drugs  Current medications and supplements including opioid prescriptions. Patient is not currently taking opioid prescriptions. Functional ability and status Nutritional status Physical activity Advanced directives List of other physicians Hospitalizations, surgeries, and ER visits in previous 12 months Vitals Screenings to include cognitive, depression, and falls Referrals and appointments  In addition, I have reviewed and discussed with patient certain preventive protocols, quality metrics, and best practice recommendations. A written personalized care plan for preventive services as well as general preventive health recommendations were provided to patient.     Annabell Sabal, CMA   05/05/2023   Nurse Notes: None

## 2023-05-05 NOTE — Patient Instructions (Signed)
Ebony Warren , Thank you for taking time to come for your Medicare Wellness Visit. I appreciate your ongoing commitment to your health goals. Please review the following plan we discussed and let me know if I can assist you in the future.   These are the goals we discussed:  Goals   None     This is a list of the screening recommended for you and due dates:  Health Maintenance  Topic Date Due   COVID-19 Vaccine (1) Never done   Hepatitis C Screening  Never done   Colon Cancer Screening  Never done   Medicare Annual Wellness Visit  05/04/2024   Mammogram  06/19/2024   Pap Smear  12/17/2025   DTaP/Tdap/Td vaccine (2 - Td or Tdap) 05/02/2031   HIV Screening  Completed   HPV Vaccine  Aged Out   Flu Shot  Discontinued    Advanced directives: Please bring a copy of your health care power of attorney and living will to the office to be added to your chart at your convenience.   Conditions/risks identified: Aim for 30 minutes of exercise or brisk walking, 6-8 glasses of water, and 5 servings of fruits and vegetables each day.   Next appointment: Follow up in one year for your annual wellness visit. 05/05/24  Preventive Care 40-64 Years, Female Preventive care refers to lifestyle choices and visits with your health care provider that can promote health and wellness. What does preventive care include? A yearly physical exam. This is also called an annual well check. Dental exams once or twice a year. Routine eye exams. Ask your health care provider how often you should have your eyes checked. Personal lifestyle choices, including: Daily care of your teeth and gums. Regular physical activity. Eating a healthy diet. Avoiding tobacco and drug use. Limiting alcohol use. Practicing safe sex. Taking low-dose aspirin daily starting at age 40. Taking vitamin and mineral supplements as recommended by your health care provider. What happens during an annual well check? The services  and screenings done by your health care provider during your annual well check will depend on your age, overall health, lifestyle risk factors, and family history of disease. Counseling  Your health care provider may ask you questions about your: Alcohol use. Tobacco use. Drug use. Emotional well-being. Home and relationship well-being. Sexual activity. Eating habits. Work and work Astronomer. Method of birth control. Menstrual cycle. Pregnancy history. Screening  You may have the following tests or measurements: Height, weight, and BMI. Blood pressure. Lipid and cholesterol levels. These may be checked every 5 years, or more frequently if you are over 4 years old. Skin check. Lung cancer screening. You may have this screening every year starting at age 7 if you have a 30-pack-year history of smoking and currently smoke or have quit within the past 15 years. Fecal occult blood test (FOBT) of the stool. You may have this test every year starting at age 9. Flexible sigmoidoscopy or colonoscopy. You may have a sigmoidoscopy every 5 years or a colonoscopy every 10 years starting at age 63. Hepatitis C blood test. Hepatitis B blood test. Sexually transmitted disease (STD) testing. Diabetes screening. This is done by checking your blood sugar (glucose) after you have not eaten for a while (fasting). You may have this done every 1-3 years. Mammogram. This may be done every 1-2 years. Talk to your health care provider about when you should start having regular mammograms. This may depend on whether you have a family  history of breast cancer. BRCA-related cancer screening. This may be done if you have a family history of breast, ovarian, tubal, or peritoneal cancers. Pelvic exam and Pap test. This may be done every 3 years starting at age 59. Starting at age 74, this may be done every 5 years if you have a Pap test in combination with an HPV test. Bone density scan. This is done to screen  for osteoporosis. You may have this scan if you are at high risk for osteoporosis. Discuss your test results, treatment options, and if necessary, the need for more tests with your health care provider. Vaccines  Your health care provider may recommend certain vaccines, such as: Influenza vaccine. This is recommended every year. Tetanus, diphtheria, and acellular pertussis (Tdap, Td) vaccine. You may need a Td booster every 10 years. Zoster vaccine. You may need this after age 43. Pneumococcal 13-valent conjugate (PCV13) vaccine. You may need this if you have certain conditions and were not previously vaccinated. Pneumococcal polysaccharide (PPSV23) vaccine. You may need one or two doses if you smoke cigarettes or if you have certain conditions. Talk to your health care provider about which screenings and vaccines you need and how often you need them. This information is not intended to replace advice given to you by your health care provider. Make sure you discuss any questions you have with your health care provider. Document Released: 12/20/2015 Document Revised: 08/12/2016 Document Reviewed: 09/24/2015 Elsevier Interactive Patient Education  2017 [employer].    Fall Prevention in the Home Falls can cause injuries. They can happen to people of all ages. There are many things you can do to make your home safe and to help prevent falls. What can I do on the outside of my home? Regularly fix the edges of walkways and driveways and fix any cracks. Remove anything that might make you trip as you walk through a door, such as a raised step or threshold. Trim any bushes or trees on the path to your home. Use bright outdoor lighting. Clear any walking paths of anything that might make someone trip, such as rocks or tools. Regularly check to see if handrails are loose or broken. Make sure that both sides of any steps have handrails. Any raised decks and porches should have guardrails on the  edges. Have any leaves, snow, or ice cleared regularly. Use sand or salt on walking paths during winter. Clean up any spills in your garage right away. This includes oil or grease spills. What can I do in the bathroom? Use night lights. Install grab bars by the toilet and in the tub and shower. Do not use towel bars as grab bars. Use non-skid mats or decals in the tub or shower. If you need to sit down in the shower, use a plastic, non-slip stool. Keep the floor dry. Clean up any water that spills on the floor as soon as it happens. Remove soap buildup in the tub or shower regularly. Attach bath mats securely with double-sided non-slip rug tape. Do not have throw rugs and other things on the floor that can make you trip. What can I do in the bedroom? Use night lights. Make sure that you have a light by your bed that is easy to reach. Do not use any sheets or blankets that are too big for your bed. They should not hang down onto the floor. Have a firm chair that has side arms. You can use this for support while you get  dressed. Do not have throw rugs and other things on the floor that can make you trip. What can I do in the kitchen? Clean up any spills right away. Avoid walking on wet floors. Keep items that you use a lot in easy-to-reach places. If you need to reach something above you, use a strong step stool that has a grab bar. Keep electrical cords out of the way. Do not use floor polish or wax that makes floors slippery. If you must use wax, use non-skid floor wax. Do not have throw rugs and other things on the floor that can make you trip. What can I do with my stairs? Do not leave any items on the stairs. Make sure that there are handrails on both sides of the stairs and use them. Fix handrails that are broken or loose. Make sure that handrails are as long as the stairways. Check any carpeting to make sure that it is firmly attached to the stairs. Fix any carpet that is loose or  worn. Avoid having throw rugs at the top or bottom of the stairs. If you do have throw rugs, attach them to the floor with carpet tape. Make sure that you have a light switch at the top of the stairs and the bottom of the stairs. If you do not have them, ask someone to add them for you. What else can I do to help prevent falls? Wear shoes that: Do not have high heels. Have rubber bottoms. Are comfortable and fit you well. Are closed at the toe. Do not wear sandals. If you use a stepladder: Make sure that it is fully opened. Do not climb a closed stepladder. Make sure that both sides of the stepladder are locked into place. Ask someone to hold it for you, if possible. Clearly mark and make sure that you can see: Any grab bars or handrails. First and last steps. Where the edge of each step is. Use tools that help you move around (mobility aids) if they are needed. These include: Canes. Walkers. Scooters. Crutches. Turn on the lights when you go into a dark area. Replace any light bulbs as soon as they burn out. Set up your furniture so you have a clear path. Avoid moving your furniture around. If any of your floors are uneven, fix them. If there are any pets around you, be aware of where they are. Review your medicines with your doctor. Some medicines can make you feel dizzy. This can increase your chance of falling. Ask your doctor what other things that you can do to help prevent falls. This information is not intended to replace advice given to you by your health care provider. Make sure you discuss any questions you have with your health care provider. Document Released: 09/19/2009 Document Revised: 04/30/2016 Document Reviewed: 12/28/2014 Elsevier Interactive Patient Education  2017 [employer].

## 2023-05-12 ENCOUNTER — Encounter: Payer: Self-pay | Admitting: Gastroenterology

## 2023-05-12 ENCOUNTER — Ambulatory Visit (INDEPENDENT_AMBULATORY_CARE_PROVIDER_SITE_OTHER): Payer: Medicare HMO | Admitting: Gastroenterology

## 2023-05-12 VITALS — BP 144/80 | HR 71 | Ht 68.0 in | Wt 272.0 lb

## 2023-05-12 DIAGNOSIS — G35 Multiple sclerosis: Secondary | ICD-10-CM

## 2023-05-12 DIAGNOSIS — K5909 Other constipation: Secondary | ICD-10-CM

## 2023-05-12 DIAGNOSIS — Z1211 Encounter for screening for malignant neoplasm of colon: Secondary | ICD-10-CM

## 2023-05-12 NOTE — Progress Notes (Signed)
[facility] Consult Note:  History: Ebony Warren 05/12/2023  Referring provider: Mercie Eon, MD  Reason for consult/chief complaint: Colonoscopy (Here to discuss screening colonoscopy. Patient states that the only issue with having the colonoscopy is that she would like to be admitted for colonoscopy in order to prep due to being wheelchair bound. Fam hx of colon polyps in mother. )   Subjective  HPI:  This is a very pleasant 46 here to see [name] for discussion of colorectal cancer screening and in particular colonoscopy.  Referred by primary care.  Her mother reportedly had colon polyps, no family history of colorectal cancer.  [name] has MS and is wheelchair-bound which leads to chronic constipation.  She manages this with MiraLAX.  Denies rectal bleeding, nausea or vomiting or abdominal pain.  She is requesting consideration of elective hospital admission to do bowel preparation for screening colonoscopy.   ROS:  Review of Systems Numbness and weakness related to MS Depression Denies chest pain or dyspnea  Past Medical History: Past Medical History:  Diagnosis Date   ADD (attention deficit disorder)    Anxiety and depression    Arthritis    Bradycardia    Constipation    Edema of both lower extremities    Encounter to establish care 01/22/2021   Family history of adverse reaction to anesthesia    nausea   Food allergy    Gallbladder problem    GERD (gastroesophageal reflux disease)    Headache    Hypertension    Multiple sclerosis (HCC)    Obesity    Osteoarthritis    Screening mammogram, encounter for 01/22/2021   Seizures (HCC)    told that they are not from the brain but stress related   Vitamin D deficiency      Past Surgical History: Past Surgical History:  Procedure Laterality Date   BREAST REDUCTION SURGERY Bilateral 06/23/2021   Procedure: MAMMARY REDUCTION  (BREAST);  Surgeon: Allena Napoleon, MD;  Location: [facility]  OR;  Service: Plastics;  Laterality: Bilateral;   CHOLECYSTECTOMY     KNEE SURGERY Bilateral    Arthroscopy     Family History: Family History  Problem Relation Age of Onset   Hypertension Mother    Atrial fibrillation Mother    Thyroid disease Mother    Cancer Mother    Obesity Mother    Colon polyps Mother    Hypertension Father    Atrial fibrillation Maternal Grandmother     Social History: Social History   Socioeconomic History   Marital status: Single    Spouse name: Not on file   Number of children: 1   Years of education: College   Highest education level: Not on file  Occupational History    Employer: OTHER    Comment: disabled  Tobacco Use   Smoking status: Former    Packs/day: 0.35    Years: 3.00    Additional pack years: 0.00    Total pack years: 1.05    Types: Cigarettes    Quit date: 12/07/2006    Years since quitting: 16.4   Smokeless tobacco: Never  Vaping Use   Vaping Use: Never used  Substance and Sexual Activity   Alcohol use: No    Alcohol/week: 0.0 standard drinks of alcohol    Comment: quit: 2013 (socially)   Drug use: Not Currently    Types: Marijuana    Comment: Smokes approx 1-2 per day   Sexual activity: Not Currently  Other Topics  Concern   Not on file  Social History Narrative   ** Merged History Encounter **       Patient lives at home with family. Caffeine Use: drinks a cup of hot tea with tecfidera in the am Patient is right handed. Patient has a college education   Social Determinants of Health   Financial Resource Strain: Patient Declined (05/05/2023)   Overall Financial Resource Strain (CARDIA)    Difficulty of Paying Living Expenses: Patient declined  Food Insecurity: No Food Insecurity (05/05/2023)   Hunger Vital Sign    Worried About Running Out of Food in the Last Year: Never true    Ran Out of Food in the Last Year: Never true  Transportation Needs: Unmet Transportation Needs (05/05/2023)   PRAPARE -  Administrator, Civil Service (Medical): Yes    Lack of Transportation (Non-Medical): Yes  Physical Activity: Insufficiently Active (05/05/2023)   Exercise Vital Sign    Days of Exercise per Week: 7 days    Minutes of Exercise per Session: 20 min  Stress: No Stress Concern Present (05/05/2023)   [employer] of Occupational Health - Occupational Stress Questionnaire    Feeling of Stress : Not at all  Social Connections: Socially Integrated (05/05/2023)   Social Connection and Isolation Panel [NHANES]    Frequency of Communication with Friends and Family: More than three times a week    Frequency of Social Gatherings with Friends and Family: More than three times a week    Attends Religious Services: More than 4 times per year    Active Member of [employer] or Organizations: Yes    Attends Engineer, structural: More than 4 times per year    Marital Status: Married    Allergies: Allergies  Allergen Reactions   Bioflavonoids Hives   Oxycodone-Acetaminophen Itching   Hydrocodone Itching    Stomach cramps   Morphine Itching   Orange Fruit [Citrus]     Acidic foods Makes her itchy and her face "feels like its on fire", have rash.   Oxycodone Itching    rash   Tomato Hives   Other Rash    Narcotics. Spicy foods (cause seizures)    Outpatient Meds: Current Outpatient Medications  Medication Sig Dispense Refill   ARIPiprazole (ABILIFY) 5 MG tablet Take 5 mg by mouth daily.     baclofen (LIORESAL) 20 MG tablet Take 1 tablet (20 mg total) by mouth 3 (three) times daily. 270 tablet 4   cholecalciferol (VITAMIN D) 1000 UNITS tablet Take 1,000 Units by mouth daily.     diphenhydrAMINE (BENADRYL) 25 mg capsule Take 25 mg by mouth every 6 (six) hours as needed for allergies.     DULoxetine (CYMBALTA) 30 MG capsule Take 1 capsule by mouth daily.     gabapentin (NEURONTIN) 300 MG capsule Take 2 capsules (600 mg total) by mouth 3 (three) times daily. 540 capsule 4    LORazepam (ATIVAN) 1 MG tablet Take 1 tablet (1 mg total) by mouth daily. 14 tablet 0   losartan (COZAAR) 50 MG tablet Take 1 tablet (50 mg total) by mouth daily. 90 tablet 3   MELATONIN PO Take by mouth at bedtime.     metFORMIN (GLUCOPHAGE-XR) 500 MG 24 hr tablet Take 1 tablet (500 mg total) by mouth 2 (two) times daily with a meal. 60 tablet 0   Multiple Vitamins-Minerals (MULTIVITAMIN ADULT PO) Take 1 tablet by mouth daily.     ocrelizumab (OCREVUS) 300 MG/10ML injection  Infuse 600 mg IV Every 6 months 20 mL 1   oxybutynin (DITROPAN-XL) 10 MG 24 hr tablet Take 1 tablet (10 mg total) by mouth daily as needed (to prevent incontinence). 90 tablet 3   pantoprazole (PROTONIX) 40 MG tablet Take 1 tablet (40 mg total) by mouth daily. 90 tablet 3   Potassium (POTASSIMIN PO) Take by mouth daily at 12 noon.     tiZANidine (ZANAFLEX) 4 MG tablet Take 1 tablet (4 mg total) by mouth 3 (three) times daily. 270 tablet 4   Current Facility-Administered Medications  Medication Dose Route Frequency Provider Last Rate Last Admin   diphenhydrAMINE (BENADRYL) injection 50 mg  50 mg Intravenous Once Penumalli, Vikram R, MD       famotidine (PEPCID) 20 mg in sodium chloride 0.9 % 50 mL IVPB  20 mg Intravenous Once Penumalli, Vikram R, MD       methylPREDNISolone sodium succinate (SOLU-MEDROL) 125 mg in sodium chloride 0.9 % 50 mL IVPB  125 mg Intravenous Once Penumalli, Vikram R, MD       ocrelizumab (OCREVUS) 600 mg in sodium chloride 0.9 % 250 mL  600 mg Intravenous Once Penumalli, Vikram R, MD          ___________________________________________________________________ Objective   Exam:  BP (!) 144/80   Pulse 71   Ht 5\' 8"  (1.727 m)   Wt 272 lb (123.4 kg)   SpO2 98%   BMI 41.36 kg/m  Wt Readings from Last 3 Encounters:  05/12/23 272 lb (123.4 kg)  05/05/23 272 lb (123.4 kg)  04/26/23 272 lb (123.4 kg)   Limited exam in wheelchair General: Pleasant and conversational Eyes: sclera anicteric, no  redness ENT: oral mucosa moist without lesions, no cervical or supraclavicular lymphadenopathy CV: Regular without appreciable murmur, no JVD, no peripheral edema Resp: clear to auscultation bilaterally, normal RR and effort noted GI: soft, no tenderness, with active bowel sounds.   Labs:     Latest Ref Rng & Units 10/27/2022   10:56 AM 05/26/2022   11:22 AM 09/23/2021   11:13 AM  CBC  WBC 3.4 - 10.8 x10E3/uL 6.3  5.1  3.8   Hemoglobin 11.1 - 15.9 g/dL 86.5  78.4  69.6   Hematocrit 34.0 - 46.6 % 45.3  42.2  43.0   Platelets 150 - 450 x10E3/uL 279  311  273       Latest Ref Rng & Units 03/25/2023    2:34 PM 02/23/2023    1:05 PM 01/27/2023    2:13 PM  CMP  Glucose 70 - 99 mg/dL 90  78  68   BUN 6 - 24 mg/dL 12  14  13    Creatinine 0.57 - 1.00 mg/dL 2.95  2.84  1.32   Sodium 134 - 144 mmol/L 141  140  139   Potassium 3.5 - 5.2 mmol/L 4.4  4.6  4.5   Chloride 96 - 106 mmol/L 102  104  100   CO2 20 - 29 mmol/L 20  22  21    Calcium 8.7 - 10.2 mg/dL 9.5  9.5  9.8   Total Protein 6.0 - 8.5 g/dL 6.7  7.0    Total Bilirubin 0.0 - 1.2 mg/dL 0.2  0.4    Alkaline Phos 44 - 121 IU/L 97  110    AST 0 - 40 IU/L 21  29    ALT 0 - 32 IU/L 38  39      Assessment: Encounter Diagnoses  Name Primary?  Special screening for malignant neoplasms, colon Yes   Multiple sclerosis (HCC)    Chronic constipation     Chronic constipation related to decreased mobility from MS, reportedly managed with MiraLAX (variable efficacy). Average risk colorectal cancer. Mobility issue precludes her from doing a bowel preparation at home. Unfortunately, current limitations on staffing in endoscopy, anesthesia, internal medicine hospitalist service and GI preclude elective admissions for bowel preparation prior to routine procedures. This is an issue across multiple service lines in our hospital system and many places throughout the country. My recommendation is for her primary care provider to refer the  patient back to [name] in a year, at which time this can be reconsidered depending on staffing availability at that time.   Thank you for the courtesy of this consult.  Please call me with any questions or concerns.  Charlie Pitter III  CC: Referring provider noted above

## 2023-05-12 NOTE — Patient Instructions (Signed)
_______________________________________________________  If your blood pressure at your visit was 140/90 or greater, please contact your primary care physician to follow up on this.  _______________________________________________________  If you are age 46 or older, your body mass index should be between 23-30. Your Body mass index is 41.36 kg/m. If this is out of the aforementioned range listed, please consider follow up with your Primary Care Provider.  If you are age 20 or younger, your body mass index should be between 19-25. Your Body mass index is 41.36 kg/m. If this is out of the aformentioned range listed, please consider follow up with your Primary Care Provider.   ________________________________________________________  The [facility] providers would like to encourage you to use [facility] to communicate with providers for non-urgent requests or questions.  Due to long hold times on the telephone, sending your provider a message by [facility] may be a faster and more efficient way to get a response.  Please allow 48 business hours for a response.  Please remember that this is for non-urgent requests.  _______________________________________________________  It was a pleasure to see you today!  Thank you for trusting me with your gastrointestinal care!

## 2023-05-13 NOTE — Progress Notes (Signed)
[facility] PREP Evaluation  Patient Details  Name: Ebony Warren MRN: [id number] Date of Birth: 1976-11-09 Age: 47 y.o. PCP: Mercie Eon, MD  Vitals:   05/13/23 1200  BP: 110/70  Pulse: 72     [facility] Eval - 05/13/23 1500       [facility] "PREP" Location   [facility] "PREP" Location [facility]      Referral    Program Start Date 02/15/23    Program End Date 05/10/23      Measurement   Waist Circumference 49 inches    Waist Circumference End Program 47.5 inches    Hip Circumference 57.5 inches    Hip Circumference End Program 57 inches    Body fat 48.5 percent      Mobility and Daily Activities   I find it easy to walk up or down two or more flights of stairs. 1    I have no trouble taking out the trash. 1    I do housework such as vacuuming and dusting on my own without difficulty. 3    I can easily lift a gallon of milk (8lbs). 3    I can easily walk a mile. 1    I have no trouble reaching into high cupboards or reaching down to pick up something from the floor. 3    I do not have trouble doing out-door work such as Loss adjuster, chartered, raking leaves, or gardening. 1      Mobility and Daily Activities   I feel younger than my age. 4    I feel independent. 2    I feel energetic. 4    I live an active life.  2    I feel strong. 3    I feel healthy. 4    I feel active as other people my age. 3      How fit and strong are you.   Fit and Strong Total Score 35            Past Medical History:  Diagnosis Date   ADD (attention deficit disorder)    Anxiety and depression    Arthritis    Bradycardia    Constipation    Edema of both lower extremities    Encounter to establish care 01/22/2021   Family history of adverse reaction to anesthesia    nausea   Food allergy    Gallbladder problem    GERD (gastroesophageal reflux disease)    Headache    Hypertension    Multiple sclerosis (HCC)    Obesity    Osteoarthritis    Screening mammogram, encounter for  01/22/2021   Seizures (HCC)    told that they are not from the brain but stress related   Vitamin D deficiency    Past Surgical History:  Procedure Laterality Date   BREAST REDUCTION SURGERY Bilateral 06/23/2021   Procedure: MAMMARY REDUCTION  (BREAST);  Surgeon: Allena Napoleon, MD;  Location: [facility] OR;  Service: Plastics;  Laterality: Bilateral;   CHOLECYSTECTOMY     KNEE SURGERY Bilateral    Arthroscopy   Social History   Tobacco Use  Smoking Status Former   Packs/day: 0.35   Years: 3.00   Additional pack years: 0.00   Total pack years: 1.05   Types: Cigarettes   Quit date: 12/07/2006   Years since quitting: 16.4  Smokeless Tobacco Never  Attended 18 sessions, 9 educational sessions Fit testing Cardio march test: 81 (seated, left leg only), 96 (seated with  ability to lift left leg) Sit to stand: 3 to 7 ( pull up on chair) Bicep curl: (left hand) 24 Able to stand upright with hands at waist. Unable to single leg stand or tandem balance tests.   [name] 05/13/2023, 3:39 PM

## 2023-05-24 ENCOUNTER — Ambulatory Visit (INDEPENDENT_AMBULATORY_CARE_PROVIDER_SITE_OTHER): Payer: Medicare HMO | Admitting: Internal Medicine

## 2023-05-24 ENCOUNTER — Encounter: Payer: Self-pay | Admitting: Diagnostic Neuroimaging

## 2023-05-24 VITALS — BP 107/73 | HR 72 | Temp 98.7°F | Ht 68.0 in | Wt 279.0 lb

## 2023-05-24 DIAGNOSIS — Z6841 Body Mass Index (BMI) 40.0 and over, adult: Secondary | ICD-10-CM | POA: Diagnosis not present

## 2023-05-24 DIAGNOSIS — E88819 Insulin resistance, unspecified: Secondary | ICD-10-CM | POA: Diagnosis not present

## 2023-05-24 DIAGNOSIS — R600 Localized edema: Secondary | ICD-10-CM | POA: Diagnosis not present

## 2023-05-24 DIAGNOSIS — I10 Essential (primary) hypertension: Secondary | ICD-10-CM

## 2023-05-24 MED ORDER — METFORMIN HCL ER 500 MG PO TB24: 500.0000 mg | ORAL_TABLET | Freq: Two times a day (BID) | ORAL | 0 refills | Status: DC

## 2023-05-24 NOTE — Assessment & Plan Note (Signed)
Repeat blood pressure was 123/79 and well-controlled. On losartan, furosemide without adverse effects.  Most recent renal parameters reviewed which showed normal electrolytes and kidney function.  Continue with nutritional and behavioral strategies.  Monitor for symptoms of orthostasis while losing weight. Continue current regimen and home monitoring for a goal blood pressure of 120/80.  

## 2023-05-24 NOTE — Assessment & Plan Note (Signed)
Likely multifactorial.  No congestive pulmonary symptoms.  She is on Abilify and gabapentin which are known to cause lower extremity edema.  I recommend that she schedule an appointment with her PCP to rule out other causes.  She may benefit from as needed use of a loop diuretic.

## 2023-05-24 NOTE — Assessment & Plan Note (Signed)
Her HOMA-IR is 4.8 which is elevated and suggest significant insulin resistance. Optimal level < 1.9. This is complex condition associated with genetics, ectopic fat and lifestyle factors. Insulin resistance may result in weight gain, abnormal cravings (particularly for carbs) and fatigue. This may result in additional weight gain and lead to pre-diabetes and diabetes if untreated.   Lab Results  Component Value Date   HGBA1C 5.3 08/19/2022   Lab Results  Component Value Date   INSULIN 25.0 (H) 02/23/2023   Lab Results  Component Value Date   GLUCOSE 90 03/25/2023   GLUCOSE 91 08/30/2014    Patient continues to lose weight with nutritional and behavioral strategies.  She is also tolerating metformin 500 mg XR 1 tablet twice a day and will continue medication.

## 2023-05-24 NOTE — Progress Notes (Signed)
Office: [phone]  /  Fax: [phone]  WEIGHT SUMMARY AND BIOMETRICS  Vitals Temp: 98.7 F (37.1 C) BP: 107/73 Pulse Rate: 72 SpO2: 98 %   Anthropometric Measurements Height: 5\' 8"  (1.727 m) Weight: 279 lb (126.6 kg) BMI (Calculated): 42.43 Weight at Last Visit: 282 lb Weight Lost Since Last Visit: 4 lb Starting Weight: 309 lb Total Weight Loss (lbs): 31 lb (14.1 kg) Peak Weight: 312 lb   No data recorded  No data recorded Today's Visit #: 5  Starting Date: 02/22/23   HPI  Chief Complaint: OBESITY  Ebony Warren is here to discuss her progress with her obesity treatment plan. She is on the the Category 2 Plan and states she is following her eating plan approximately 50 % of the time. She states she is exercising 30 minutes 3 times per week.  Interval History:  Since last office visit she has lost 4 lbs. She reports good adherence to reduced calorie nutritional plan. She has been working on not skipping meals, increasing protein intake at every meal, eating more fruits, eating more vegetables, drinking more water, making healthier choices, reducing portion sizes, mindfulness around eating, and controlling orexigenic cues and stimuli. Doing exercises on bicycle and bands.  Orixegenic Control: Denies problems with appetite and hunger signals.  Denies problems with satiety and satiation.  Denies problems with eating patterns and portion control.  Reports abnormal cravings for sweets which she manages with fruit. Denies feeling deprived or restricted.   Barriers identified: none and presence of obesogenic drugs.  She is on Abilify and gabapentin.  Pharmacotherapy for weight loss: She is currently taking Metformin (off label use for incretin effect and / or insulin resistance and / or diabetes prevention) with adequate clinical response  and without side effects..    ASSESSMENT AND PLAN  TREATMENT PLAN FOR OBESITY:  Recommended Dietary Goals  [name] is  currently in the action stage of change. As such, her goal is to continue weight management plan. She has agreed to: continue current plan  Behavioral Intervention  We discussed the following Behavioral Modification Strategies today: increasing lean protein intake, decreasing simple carbohydrates , increasing vegetables, increasing lower glycemic fruits, increasing fiber rich foods, increasing water intake, continue to practice mindfulness when eating, and planning for success.  Additional resources provided today: None  Recommended Physical Activity Goals  [name] has been advised to work up to 150 minutes of moderate intensity aerobic activity a week and strengthening exercises 2-3 times per week for cardiovascular health, weight loss maintenance and preservation of muscle mass.   She has agreed to :  Continue current level of physical activity   Pharmacotherapy We discussed various medication options to help [name] with her weight loss efforts and we both agreed to :  Continue metformin for incretin effect  ASSOCIATED CONDITIONS ADDRESSED TODAY  Essential hypertension Assessment & Plan: Repeat blood pressure was 123/79 and well-controlled. On losartan, furosemide without adverse effects.  Most recent renal parameters reviewed which showed normal electrolytes and kidney function.  Continue with nutritional and behavioral strategies.  Monitor for symptoms of orthostasis while losing weight. Continue current regimen and home monitoring for a goal blood pressure of 120/80.    Insulin resistance Assessment & Plan: Her HOMA-IR is 4.8 which is elevated and suggest significant insulin resistance. Optimal level < 1.9. This is complex condition associated with genetics, ectopic fat and lifestyle factors. Insulin resistance may result in weight gain, abnormal cravings (particularly for carbs) and fatigue. This may result in additional  weight gain and lead to pre-diabetes and diabetes if  untreated.   Lab Results  Component Value Date   HGBA1C 5.3 08/19/2022   Lab Results  Component Value Date   INSULIN 25.0 (H) 02/23/2023   Lab Results  Component Value Date   GLUCOSE 90 03/25/2023   GLUCOSE 91 08/30/2014    Patient continues to lose weight with nutritional and behavioral strategies.  She is also tolerating metformin 500 mg XR 1 tablet twice a day and will continue medication.   Class 3 severe obesity with serious comorbidity and body mass index (BMI) of 45.0 to 49.9 in adult, unspecified obesity type [facility]) Assessment & Plan: [name] has lost approximately 40 pounds or 13% of baseline body weight.  She is on Abilify and gabapentin which may contribute to weight gain.  She is also edematous.  She will continue with nutritional and behavioral strategies.  She is also increasing her physical activity.  She will continue on metformin for diabetes prevention and incretin effect.  This will also help offset some of her medication induced weight gain.  She will reach out to her neurologist to discuss about possibly switching to either topiramate or zonisamide if deemed appropriate to help her with her weight loss efforts.   Leg edema Assessment & Plan: Likely multifactorial.  No congestive pulmonary symptoms.  She is on Abilify and gabapentin which are known to cause lower extremity edema.  I recommend that she schedule an appointment with her PCP to rule out other causes.  She may benefit from as needed use of a loop diuretic.     PHYSICAL EXAM:  Blood pressure 107/73, pulse 72, temperature 98.7 F (37.1 C), height 5\' 8"  (1.727 m), weight 279 lb (126.6 kg), SpO2 98 %. Body mass index is 42.42 kg/m.  General: She is overweight, cooperative, alert, well developed, and in no acute distress. PSYCH: Has normal mood, affect and thought process.   HEENT: EOMI, sclerae are anicteric. Lungs: Normal breathing effort, no conversational dyspnea. Extremities: No edema.   Neurologic: No gross sensory or motor deficits. No tremors or fasciculations noted.    DIAGNOSTIC DATA REVIEWED:  BMET    Component Value Date/Time   NA 141 03/25/2023 1434   K 4.4 03/25/2023 1434   CL 102 03/25/2023 1434   CO2 20 03/25/2023 1434   GLUCOSE 90 03/25/2023 1434   GLUCOSE 85 06/23/2021 1136   BUN 12 03/25/2023 1434   CREATININE 0.86 03/25/2023 1434   CALCIUM 9.5 03/25/2023 1434   GFRNONAA 76 12/10/2020 1251   GFRAA 87 12/10/2020 1251   Lab Results  Component Value Date   HGBA1C 5.3 08/19/2022   Lab Results  Component Value Date   INSULIN 25.0 (H) 02/23/2023   Lab Results  Component Value Date   TSH 1.690 02/23/2023   CBC    Component Value Date/Time   WBC 6.3 10/27/2022 1056   WBC 7.3 08/30/2014 1031   RBC 4.77 10/27/2022 1056   RBC 4.07 08/30/2014 1031   HGB 14.8 10/27/2022 1056   HCT 45.3 10/27/2022 1056   PLT 279 10/27/2022 1056   MCV 95 10/27/2022 1056   MCH 31.0 10/27/2022 1056   MCH 31.4 08/30/2014 1031   MCHC 32.7 10/27/2022 1056   MCHC 33.9 08/30/2014 1031   RDW 12.1 10/27/2022 1056   Iron Studies    Component Value Date/Time   IRON 68 10/27/2022 1056   TIBC 313 10/27/2022 1056   FERRITIN 41 10/27/2022 1056   IRONPCTSAT 22  10/27/2022 1056   Lipid Panel     Component Value Date/Time   CHOL 149 02/23/2023 1305   TRIG 72 02/23/2023 1305   HDL 50 02/23/2023 1305   LDLCALC 85 02/23/2023 1305   Hepatic Function Panel     Component Value Date/Time   PROT 6.7 03/25/2023 1434   ALBUMIN 4.1 03/25/2023 1434   AST 21 03/25/2023 1434   ALT 38 (H) 03/25/2023 1434   ALKPHOS 97 03/25/2023 1434   BILITOT 0.2 03/25/2023 1434      Component Value Date/Time   TSH 1.690 02/23/2023 1305   Nutritional Lab Results  Component Value Date   VD25OH 55.6 02/23/2023   VD25OH 38.1 09/23/2021   VD25OH 34.1 12/22/2016     Return in about 3 weeks (around 06/14/2023) for For Weight Mangement with Dr. Rikki Spearing.Marland Kitchen She was informed of the  importance of frequent follow up visits to maximize her success with intensive lifestyle modifications for her multiple health conditions.   ATTESTASTION STATEMENTS:  Reviewed by clinician on day of visit: allergies, medications, problem list, medical history, surgical history, family history, social history, and previous encounter notes.     Worthy Rancher, MD

## 2023-05-24 NOTE — Progress Notes (Signed)
I reviewed the annual wellness visit and I agree with the assessment and plan.  

## 2023-05-24 NOTE — Assessment & Plan Note (Signed)
Ebony Warren has lost approximately 40 pounds or 13% of baseline body weight.  She is on Abilify and gabapentin which may contribute to weight gain.  She is also edematous.  She will continue with nutritional and behavioral strategies.  She is also increasing her physical activity.  She will continue on metformin for diabetes prevention and incretin effect.  This will also help offset some of her medication induced weight gain.  She will reach out to her neurologist to discuss about possibly switching to either topiramate or zonisamide if deemed appropriate to help her with her weight loss efforts.

## 2023-05-31 ENCOUNTER — Telehealth: Payer: Self-pay | Admitting: Diagnostic Neuroimaging

## 2023-05-31 MED ORDER — TOPIRAMATE 50 MG PO TABS: 50.0000 mg | ORAL_TABLET | Freq: Two times a day (BID) | ORAL | 12 refills | Status: DC

## 2023-05-31 NOTE — Telephone Encounter (Signed)
Start topiramate 50mg  twice a day.   Start to wean off gabapentin; reduce to 300mg  three times a day x 2 weeks; then 30mg  daily x 2 weeks; then stop.  Meds ordered this encounter  Medications   topiramate (TOPAMAX) 50 MG tablet    Sig: Take 1 tablet (50 mg total) by mouth 2 (two) times daily.    Dispense:  60 tablet    Refill:  12     Suanne Marker, MD 05/31/2023, 4:06 PM Certified in Neurology, Neurophysiology and Neuroimaging  [facility] [address], [address] [place], [place] [address] [phone]

## 2023-05-31 NOTE — Addendum Note (Signed)
Addended by: Joycelyn Schmid R on: 05/31/2023 04:07 PM   Modules accepted: Orders

## 2023-05-31 NOTE — Telephone Encounter (Signed)
Pt is asking for a call with an update in response to the my chart message she sent on 6-17 re" Gabapentin

## 2023-05-31 NOTE — Telephone Encounter (Signed)
Called Patient and informed her the new changes to her Rx. Per Dr. Marjory Lies "  Start topiramate 50mg  twice a day.    Start to wean off gabapentin; reduce to 300mg  three times a day x 2 weeks; then 30mg  daily x 2 weeks; then stop.  Pt verbalized understanding. Pt had no questions at this time but was encouraged to call back if questions arise.

## 2023-06-01 NOTE — Telephone Encounter (Signed)
See my chart message

## 2023-06-07 ENCOUNTER — Other Ambulatory Visit: Payer: Self-pay

## 2023-06-07 ENCOUNTER — Other Ambulatory Visit: Payer: Self-pay | Admitting: Neurology

## 2023-06-07 DIAGNOSIS — R29898 Other symptoms and signs involving the musculoskeletal system: Secondary | ICD-10-CM

## 2023-06-07 DIAGNOSIS — G35 Multiple sclerosis: Secondary | ICD-10-CM

## 2023-06-07 DIAGNOSIS — E559 Vitamin D deficiency, unspecified: Secondary | ICD-10-CM

## 2023-06-07 MED ORDER — OXYBUTYNIN CHLORIDE ER 10 MG PO TB24: 10.0000 mg | ORAL_TABLET | Freq: Every day | ORAL | 3 refills | Status: DC | PRN

## 2023-06-11 ENCOUNTER — Non-Acute Institutional Stay (HOSPITAL_COMMUNITY)
Admission: RE | Admit: 2023-06-11 | Discharge: 2023-06-11 | Disposition: A | Discharge status: Home / Self Care | Payer: Medicare HMO | Source: Ambulatory Visit | Attending: Internal Medicine | Admitting: Internal Medicine

## 2023-06-11 DIAGNOSIS — G35 Multiple sclerosis: Secondary | ICD-10-CM

## 2023-06-11 DIAGNOSIS — E559 Vitamin D deficiency, unspecified: Secondary | ICD-10-CM

## 2023-06-11 DIAGNOSIS — R29898 Other symptoms and signs involving the musculoskeletal system: Secondary | ICD-10-CM

## 2023-06-11 MED ORDER — ACETAMINOPHEN 325 MG PO TABS
650.0000 mg | ORAL_TABLET | Freq: Once | ORAL | Status: AC
  Administered 2023-06-11: 650 mg via ORAL
  Filled 2023-06-11: qty 2

## 2023-06-11 MED ORDER — SODIUM CHLORIDE 0.9 % IV SOLN: INTRAVENOUS | Status: DC | PRN

## 2023-06-11 MED ORDER — METHYLPREDNISOLONE SODIUM SUCC 125 MG IJ SOLR
125.0000 mg | Freq: Once | INTRAMUSCULAR | Status: AC
  Administered 2023-06-11: 125 mg via INTRAVENOUS
  Filled 2023-06-11: qty 2

## 2023-06-11 MED ORDER — SODIUM CHLORIDE 0.9 % IV SOLN
600.0000 mg | Freq: Once | INTRAVENOUS | Status: AC
  Administered 2023-06-11: 600 mg via INTRAVENOUS
  Filled 2023-06-11: qty 20

## 2023-06-11 MED ORDER — DIPHENHYDRAMINE HCL 50 MG/ML IJ SOLN
50.0000 mg | Freq: Once | INTRAMUSCULAR | Status: AC
  Administered 2023-06-11: 50 mg via INTRAVENOUS
  Filled 2023-06-11: qty 1

## 2023-06-11 MED ORDER — FAMOTIDINE IN NACL 20-0.9 MG/50ML-% IV SOLN
20.0000 mg | Freq: Once | INTRAVENOUS | Status: AC
  Administered 2023-06-11: 20 mg via INTRAVENOUS
  Filled 2023-06-11: qty 50

## 2023-06-11 NOTE — Progress Notes (Addendum)
PATIENT CARE CENTER NOTE:  Diagnosis: Multiple Sclerosis ( HCC) [G35]  Provider: Joycelyn Schmid MD  Procedure: Emogene Morgan 600mg  infusion    Patient received IV Ocrevus ( dose #1 of 1).  Pre infusion medications - Tylenol PO,  Benadryl IV, Solu-medrol IV, and Pepcid IVPB were given.  After benadryl and solumedrol were given, redness was noted along vein of IV site, pt states it does not hurt and has no complaints, IV flushes without difficulty and gives blood return. NS at 108ml/hr allowed to flush through IV for approximately 1 hour, redness resolved and pt has no complaints or concerns. Pepcid administered as ordered once redness resolved, tolerated without incident .Ocrevus was titrated per protocol. Patient observed for 60 minutes post infusion, no s/s reaction noted. No further redness to the IV site noted during infusion. Tolerated well, vitals stable, discharge instructions given, verbalized understanding. Pt to RTC in January 2025 for next infusion, pt notified of date and time, verbalized understanding. Patient stable, alert, oriented and discharged in motorized wheelchair.

## 2023-06-14 ENCOUNTER — Encounter: Payer: Self-pay | Admitting: Diagnostic Neuroimaging

## 2023-06-15 ENCOUNTER — Ambulatory Visit (INDEPENDENT_AMBULATORY_CARE_PROVIDER_SITE_OTHER): Payer: Medicare HMO | Admitting: Internal Medicine

## 2023-06-15 ENCOUNTER — Encounter (INDEPENDENT_AMBULATORY_CARE_PROVIDER_SITE_OTHER): Payer: Self-pay

## 2023-06-16 ENCOUNTER — Encounter (INDEPENDENT_AMBULATORY_CARE_PROVIDER_SITE_OTHER): Payer: Self-pay | Admitting: Internal Medicine

## 2023-06-16 ENCOUNTER — Ambulatory Visit (INDEPENDENT_AMBULATORY_CARE_PROVIDER_SITE_OTHER): Payer: Medicare HMO | Admitting: Internal Medicine

## 2023-06-16 VITALS — BP 102/70 | HR 84 | Temp 98.5°F | Ht 68.0 in | Wt 279.0 lb

## 2023-06-16 DIAGNOSIS — K5900 Constipation, unspecified: Secondary | ICD-10-CM

## 2023-06-16 DIAGNOSIS — Z6841 Body Mass Index (BMI) 40.0 and over, adult: Secondary | ICD-10-CM

## 2023-06-16 DIAGNOSIS — I10 Essential (primary) hypertension: Secondary | ICD-10-CM | POA: Diagnosis not present

## 2023-06-16 DIAGNOSIS — E88819 Insulin resistance, unspecified: Secondary | ICD-10-CM

## 2023-06-16 DIAGNOSIS — K5909 Other constipation: Secondary | ICD-10-CM | POA: Insufficient documentation

## 2023-06-16 MED ORDER — BISACODYL 5 MG PO TBEC: 5.0000 mg | DELAYED_RELEASE_TABLET | Freq: Every day | ORAL | 0 refills | Status: DC | PRN

## 2023-06-16 MED ORDER — POLYETHYLENE GLYCOL 3350 17 GM/SCOOP PO POWD: 17.0000 g | Freq: Two times a day (BID) | ORAL | 1 refills | Status: AC | PRN

## 2023-06-16 MED ORDER — METFORMIN HCL ER 500 MG PO TB24
500.0000 mg | ORAL_TABLET | Freq: Two times a day (BID) | ORAL | 0 refills | Status: DC
Start: 2023-06-16 — End: 2023-07-05

## 2023-06-16 NOTE — Assessment & Plan Note (Signed)
Repeat blood pressure is at goal.  On losartan, furosemide without adverse effects.  Most recent renal parameters reviewed which showed normal electrolytes and kidney function.  Continue with nutritional and behavioral strategies.  Monitor for symptoms of orthostasis while losing weight. Continue current regimen and home monitoring for a goal blood pressure of 120/80.

## 2023-06-16 NOTE — Assessment & Plan Note (Signed)
Ebony Warren has lost approximately 40 pounds or 13% of baseline body weight.  She is now no longer in gabapentin and is on topiramate which may help with weight loss.  She does not note an improvement in appetite control since starting medication. She will continue with nutritional and behavioral strategies. She will continue on metformin for insulin resistance and incretin effect.  This will also help offset some of her medication induced weight gain.

## 2023-06-16 NOTE — Assessment & Plan Note (Signed)
Multifactorial.  She is on several constipating drugs and is not on a regular bowel regimen.  She will start MiraLAX 2 servings a day and use Dulcolax orally she may do suppository as needed.  She may also eat 2 kiwi's a day for added fiber.  If no response she may have to do a Fleet enema or contact PCP for further evaluation.

## 2023-06-16 NOTE — Progress Notes (Signed)
Office: [phone]  /  Fax: [phone]  WEIGHT SUMMARY AND BIOMETRICS  Vitals Temp: 98.5 F (36.9 C) BP: 102/70 Pulse Rate: 84 SpO2: 100 %   Anthropometric Measurements Height: 5\' 8"  (1.727 m) Weight: 279 lb (126.6 kg) BMI (Calculated): 42.43 Weight at Last Visit: 279 lb Weight Lost Since Last Visit: 0 Starting Weight: 309 lb Total Weight Loss (lbs): 31 lb (14.1 kg) Peak Weight: 312 lb   No data recorded  No data recorded Today's Visit #: 6  Starting Date: 02/22/23   HPI  Chief Complaint: OBESITY  Ebony Warren is here to discuss her progress with her obesity treatment plan. She is on the the Category 2 Plan and states she is following her eating plan approximately 40 % of the time. She states she is not exercising.  Interval History:  Since last office visit she has maintained.  She reports having problems with constipation and has not had a bowel movement in 6 to 7 days.  Her regular is every 3 days.  She is on several constipating drugs.  She denies any nausea or vomiting.  She is somewhat disappointed that she has not lost weight but it may be related to this. She reports good adherence to reduced calorie nutritional plan. She has been working on not skipping meals, increasing protein intake at every meal, eating more fruits, eating more vegetables, drinking more water, avoiding and or reducing liquid calories, making healthier choices, and reducing portion sizes  Orixegenic Control: Denies problems with appetite and hunger signals.  Denies problems with satiety and satiation.  Denies problems with eating patterns and portion control.  Denies abnormal cravings. Denies feeling deprived or restricted.   Barriers identified: physical inactivity, medical comorbidities, and presence of obesogenic drugs.   Pharmacotherapy for weight loss: She is currently taking Metformin (off label use for incretin effect and / or insulin resistance and / or diabetes prevention)  with adequate clinical response  and without side effects..    ASSESSMENT AND PLAN  TREATMENT PLAN FOR OBESITY:  Recommended Dietary Goals  Ebony Warren is currently in the action stage of change. As such, her goal is to continue weight management plan. She has agreed to: continue current plan  Behavioral Intervention  We discussed the following Behavioral Modification Strategies today: increasing lean protein intake, decreasing simple carbohydrates , increasing vegetables, increasing lower glycemic fruits, increasing fiber rich foods, increasing water intake, continue to practice mindfulness when eating, and planning for success.  Additional resources provided today: None  Recommended Physical Activity Goals  Ebony Warren has been advised to work up to 150 minutes of moderate intensity aerobic activity a week and strengthening exercises 2-3 times per week for cardiovascular health, weight loss maintenance and preservation of muscle mass.   She has agreed to :  Think about ways to increase daily physical activity and overcoming barriers to exercise  Pharmacotherapy We discussed various medication options to help Ebony Warren with her weight loss efforts and we both agreed to : continue current anti-obesity medication regimen  ASSOCIATED CONDITIONS ADDRESSED TODAY  Constipation, unspecified constipation type Assessment & Plan: Multifactorial.  She is on several constipating drugs and is not on a regular bowel regimen.  She will start MiraLAX 2 servings a day and use Dulcolax orally she may do suppository as needed.  She may also eat 2 kiwi's a day for added fiber.  If no response she may have to do a Fleet enema or contact PCP for further evaluation.   Insulin resistance Assessment &  Plan: Her HOMA-IR is 4.8 which is elevated and suggest significant insulin resistance. Optimal level < 1.9.  Her hemoglobin A1c is normal.  This is complex condition associated with genetics, ectopic fat and  lifestyle factors. Insulin resistance may result in weight gain, abnormal cravings (particularly for carbs) and fatigue. This may result in additional weight gain and lead to pre-diabetes and diabetes if untreated.   Lab Results  Component Value Date   HGBA1C 5.3 08/19/2022   Lab Results  Component Value Date   INSULIN 25.0 (H) 02/23/2023   Lab Results  Component Value Date   GLUCOSE 90 03/25/2023   GLUCOSE 91 08/30/2014   We also reviewed today the risk associated with high insulin levels including increased risk for cardiovascular disease, further weight gain, chronic inflammation and increased cancer risk.  We also discussed the importance of reducing processed carbs from her diet.  Patient continues to lose weight with nutritional and behavioral strategies.  She is also tolerating metformin 500 mg XR 1 tablet twice a day and will continue medication.  Orders: -     metFORMIN HCl ER; Take 1 tablet (500 mg total) by mouth 2 (two) times daily with a meal.  Dispense: 60 tablet; Refill: 0  Essential hypertension Assessment & Plan: Repeat blood pressure is at goal.  On losartan, furosemide without adverse effects.  Most recent renal parameters reviewed which showed normal electrolytes and kidney function.  Continue with nutritional and behavioral strategies.  Monitor for symptoms of orthostasis while losing weight. Continue current regimen and home monitoring for a goal blood pressure of 120/80.    Class 3 severe obesity with serious comorbidity and body mass index (BMI) of 45.0 to 49.9 in adult, unspecified obesity type [facility]) Assessment & Plan: Ebony Warren has lost approximately 40 pounds or 13% of baseline body weight.  She is now no longer in gabapentin and is on topiramate which may help with weight loss.  She does not note an improvement in appetite control since starting medication. She will continue with nutritional and behavioral strategies. She will continue on metformin for  insulin resistance and incretin effect.  This will also help offset some of her medication induced weight gain.     Other orders -     Polyethylene Glycol 3350; Take 17 g by mouth 2 (two) times daily as needed.  Dispense: 765 g; Refill: 1 -     Bisacodyl; Take 1 tablet (5 mg total) by mouth daily as needed for moderate constipation.  Dispense: 30 tablet; Refill: 0    PHYSICAL EXAM:  Blood pressure 102/70, pulse 84, temperature 98.5 F (36.9 C), height 5\' 8"  (1.727 m), weight 279 lb (126.6 kg), SpO2 100 %. Body mass index is 42.42 kg/m.  General: She is overweight, cooperative, alert, well developed, and in no acute distress. PSYCH: Has normal mood, affect and thought process.   HEENT: EOMI, sclerae are anicteric. Lungs: Normal breathing effort, no conversational dyspnea. Extremities: No edema.  Neurologic: No gross sensory or motor deficits. No tremors or fasciculations noted.    DIAGNOSTIC DATA REVIEWED:  BMET    Component Value Date/Time   NA 141 03/25/2023 1434   K 4.4 03/25/2023 1434   CL 102 03/25/2023 1434   CO2 20 03/25/2023 1434   GLUCOSE 90 03/25/2023 1434   GLUCOSE 85 06/23/2021 1136   BUN 12 03/25/2023 1434   CREATININE 0.86 03/25/2023 1434   CALCIUM 9.5 03/25/2023 1434   GFRNONAA 76 12/10/2020 1251   GFRAA 87 12/10/2020  1251   Lab Results  Component Value Date   HGBA1C 5.3 08/19/2022   Lab Results  Component Value Date   INSULIN 25.0 (H) 02/23/2023   Lab Results  Component Value Date   TSH 1.690 02/23/2023   CBC    Component Value Date/Time   WBC 6.3 10/27/2022 1056   WBC 7.3 08/30/2014 1031   RBC 4.77 10/27/2022 1056   RBC 4.07 08/30/2014 1031   HGB 14.8 10/27/2022 1056   HCT 45.3 10/27/2022 1056   PLT 279 10/27/2022 1056   MCV 95 10/27/2022 1056   MCH 31.0 10/27/2022 1056   MCH 31.4 08/30/2014 1031   MCHC 32.7 10/27/2022 1056   MCHC 33.9 08/30/2014 1031   RDW 12.1 10/27/2022 1056   Iron Studies    Component Value Date/Time   IRON  68 10/27/2022 1056   TIBC 313 10/27/2022 1056   FERRITIN 41 10/27/2022 1056   IRONPCTSAT 22 10/27/2022 1056   Lipid Panel     Component Value Date/Time   CHOL 149 02/23/2023 1305   TRIG 72 02/23/2023 1305   HDL 50 02/23/2023 1305   LDLCALC 85 02/23/2023 1305   Hepatic Function Panel     Component Value Date/Time   PROT 6.7 03/25/2023 1434   ALBUMIN 4.1 03/25/2023 1434   AST 21 03/25/2023 1434   ALT 38 (H) 03/25/2023 1434   ALKPHOS 97 03/25/2023 1434   BILITOT 0.2 03/25/2023 1434      Component Value Date/Time   TSH 1.690 02/23/2023 1305   Nutritional Lab Results  Component Value Date   VD25OH 55.6 02/23/2023   VD25OH 38.1 09/23/2021   VD25OH 34.1 12/22/2016     Return in about 2 weeks (around 06/30/2023) for For Weight Mangement with Dr. Rikki Spearing.Marland Kitchen She was informed of the importance of frequent follow up visits to maximize her success with intensive lifestyle modifications for her multiple health conditions.   ATTESTASTION STATEMENTS:  Reviewed by clinician on day of visit: allergies, medications, problem list, medical history, surgical history, family history, social history, and previous encounter notes.     Worthy Rancher, MD

## 2023-06-16 NOTE — Assessment & Plan Note (Signed)
Her HOMA-IR is 4.8 which is elevated and suggest significant insulin resistance. Optimal level < 1.9.  Her hemoglobin A1c is normal.  This is complex condition associated with genetics, ectopic fat and lifestyle factors. Insulin resistance may result in weight gain, abnormal cravings (particularly for carbs) and fatigue. This may result in additional weight gain and lead to pre-diabetes and diabetes if untreated.   Lab Results  Component Value Date   HGBA1C 5.3 08/19/2022   Lab Results  Component Value Date   INSULIN 25.0 (H) 02/23/2023   Lab Results  Component Value Date   GLUCOSE 90 03/25/2023   GLUCOSE 91 08/30/2014   We also reviewed today the risk associated with high insulin levels including increased risk for cardiovascular disease, further weight gain, chronic inflammation and increased cancer risk.  We also discussed the importance of reducing processed carbs from her diet.  Patient continues to lose weight with nutritional and behavioral strategies.  She is also tolerating metformin 500 mg XR 1 tablet twice a day and will continue medication.

## 2023-06-29 ENCOUNTER — Encounter: Payer: Self-pay | Admitting: Diagnostic Neuroimaging

## 2023-06-29 DIAGNOSIS — G35 Multiple sclerosis: Secondary | ICD-10-CM

## 2023-06-29 DIAGNOSIS — E559 Vitamin D deficiency, unspecified: Secondary | ICD-10-CM

## 2023-06-29 DIAGNOSIS — R29898 Other symptoms and signs involving the musculoskeletal system: Secondary | ICD-10-CM

## 2023-07-05 ENCOUNTER — Encounter (INDEPENDENT_AMBULATORY_CARE_PROVIDER_SITE_OTHER): Payer: Self-pay | Admitting: Internal Medicine

## 2023-07-05 ENCOUNTER — Ambulatory Visit (INDEPENDENT_AMBULATORY_CARE_PROVIDER_SITE_OTHER): Payer: Medicare HMO | Admitting: Internal Medicine

## 2023-07-05 VITALS — BP 110/74 | HR 80 | Ht 68.0 in | Wt 270.0 lb

## 2023-07-05 DIAGNOSIS — Z6841 Body Mass Index (BMI) 40.0 and over, adult: Secondary | ICD-10-CM | POA: Diagnosis not present

## 2023-07-05 DIAGNOSIS — I10 Essential (primary) hypertension: Secondary | ICD-10-CM | POA: Diagnosis not present

## 2023-07-05 DIAGNOSIS — E88819 Insulin resistance, unspecified: Secondary | ICD-10-CM | POA: Diagnosis not present

## 2023-07-05 DIAGNOSIS — R600 Localized edema: Secondary | ICD-10-CM

## 2023-07-05 MED ORDER — METFORMIN HCL ER 500 MG PO TB24: 500.0000 mg | ORAL_TABLET | Freq: Two times a day (BID) | ORAL | 0 refills | Status: DC

## 2023-07-05 NOTE — Assessment & Plan Note (Signed)
Repeat blood pressure is at goal.  On losartan, furosemide without adverse effects.  Most recent renal parameters reviewed which showed normal electrolytes and kidney function.  Continue with nutritional and behavioral strategies.  Monitor for symptoms of orthostasis while losing weight. Continue current regimen and home monitoring for a goal blood pressure of 120/80.

## 2023-07-05 NOTE — Assessment & Plan Note (Signed)
Her HOMA-IR is 4.8 which is elevated and suggest significant insulin resistance. Optimal level < 1.9.  Her hemoglobin A1c is normal.  This is complex condition associated with genetics, ectopic fat and lifestyle factors. Insulin resistance may result in weight gain, abnormal cravings (particularly for carbs) and fatigue. This may result in additional weight gain and lead to pre-diabetes and diabetes if untreated.   Lab Results  Component Value Date   HGBA1C 5.3 08/19/2022   Lab Results  Component Value Date   INSULIN 25.0 (H) 02/23/2023   Lab Results  Component Value Date   GLUCOSE 90 03/25/2023   GLUCOSE 91 08/30/2014   We also reviewed today the risk associated with high insulin levels including increased risk for cardiovascular disease, further weight gain, chronic inflammation and increased cancer risk.    She has been working with her caregiver to reduce simple sugars in her diet.  Patient continues to lose weight with nutritional and behavioral strategies.  She is also tolerating metformin 500 mg XR 1 tablet twice a day and will continue medication.

## 2023-07-05 NOTE — Assessment & Plan Note (Signed)
Improved.  Likely exacerbated by gabapentin which she is no longer on.  Continue with loop diuretic as needed.

## 2023-07-05 NOTE — Progress Notes (Signed)
Office: [phone]  /  Fax: [phone]  WEIGHT SUMMARY AND BIOMETRICS  Vitals BP: 110/74 Pulse Rate: 80 SpO2: 100 %   Anthropometric Measurements Height: 5\' 8"  (1.727 m) Weight: 270 lb (122.5 kg) BMI (Calculated): 41.06 Weight at Last Visit: 279 lb Weight Lost Since Last Visit: 9 lb Starting Weight: 309 lb Total Weight Loss (lbs): 40 lb (18.1 kg) Peak Weight: 312 lb   No data recorded  No data recorded Today's Visit #: 7  Starting Date: 02/22/23   HPI  Chief Complaint: OBESITY  Ebony Warren is here to discuss her progress with her obesity treatment plan. She is on the the Category 2 Plan and states she is following her eating plan approximately 75 % of the time. She states she is exercising 75 minutes 2 times per week.  Interval History:  Since last office visit she has 9 lbs. She reports good adherence to reduced calorie nutritional plan. She has been working on not skipping meals, increasing protein intake at every meal, eating more fruits, eating more vegetables, drinking more water, avoiding and / or reducing liquid calories, avoiding or reducing simple and processed carbohydrates, making healthier choices, continues to exercise, and working on meal prepping  Orixegenic Control: Denies problems with appetite and hunger signals.  Denies problems with satiety and satiation.  Denies problems with eating patterns and portion control.  Denies abnormal cravings. Denies feeling deprived or restricted.   Barriers identified: orthopedic problems, medical conditions or chronic pain affecting mobility and presence of obesogenic drugs.   Pharmacotherapy for weight loss: She is currently taking Metformin (off label use for incretin effect and / or insulin resistance and / or diabetes prevention) with adequate clinical response  and without side effects..    ASSESSMENT AND PLAN  TREATMENT PLAN FOR OBESITY:  Recommended Dietary Goals  Ebony Warren is currently in the  action stage of change. As such, her goal is to continue weight management plan. She has agreed to: continue current plan  Behavioral Intervention  We discussed the following Behavioral Modification Strategies today: increasing lean protein intake, decreasing simple carbohydrates , increasing vegetables, increasing lower glycemic fruits, increasing fiber rich foods, increasing water intake, continue to practice mindfulness when eating, and planning for success.  Additional resources provided today: None  Recommended Physical Activity Goals  Ebony Warren has been advised to work up to 150 minutes of moderate intensity aerobic activity a week and strengthening exercises 2-3 times per week for cardiovascular health, weight loss maintenance and preservation of muscle mass.   She has agreed to :  Think about ways to increase daily physical activity and overcoming barriers to exercise  Pharmacotherapy We discussed various medication options to help Ebony Warren with her weight loss efforts and we both agreed to : continue with nutritional and behavioral strategies  ASSOCIATED CONDITIONS ADDRESSED TODAY  Essential hypertension Assessment & Plan: Repeat blood pressure is at goal.  On losartan, furosemide without adverse effects.  Most recent renal parameters reviewed which showed normal electrolytes and kidney function.  Continue with nutritional and behavioral strategies.  Monitor for symptoms of orthostasis while losing weight. Continue current regimen and home monitoring for a goal blood pressure of 120/80.    Insulin resistance Assessment & Plan: Her HOMA-IR is 4.8 which is elevated and suggest significant insulin resistance. Optimal level < 1.9.  Her hemoglobin A1c is normal.  This is complex condition associated with genetics, ectopic fat and lifestyle factors. Insulin resistance may result in weight gain, abnormal cravings (particularly for carbs) and  fatigue. This may result in additional  weight gain and lead to pre-diabetes and diabetes if untreated.   Lab Results  Component Value Date   HGBA1C 5.3 08/19/2022   Lab Results  Component Value Date   INSULIN 25.0 (H) 02/23/2023   Lab Results  Component Value Date   GLUCOSE 90 03/25/2023   GLUCOSE 91 08/30/2014   We also reviewed today the risk associated with high insulin levels including increased risk for cardiovascular disease, further weight gain, chronic inflammation and increased cancer risk.    She has been working with her caregiver to reduce simple sugars in her diet.  Patient continues to lose weight with nutritional and behavioral strategies.  She is also tolerating metformin 500 mg XR 1 tablet twice a day and will continue medication.  Orders: -     metFORMIN HCl ER; Take 1 tablet (500 mg total) by mouth 2 (two) times daily with a meal.  Dispense: 60 tablet; Refill: 0  Class 3 severe obesity with serious comorbidity and body mass index (BMI) of 45.0 to 49.9 in adult, unspecified obesity type (HCC)  Leg edema Assessment & Plan: Improved.  Likely exacerbated by gabapentin which she is no longer on.  Continue with loop diuretic as needed.     PHYSICAL EXAM:  Blood pressure 110/74, pulse 80, height 5\' 8"  (1.727 m), weight 270 lb (122.5 kg), SpO2 100%. Body mass index is 41.05 kg/m.  General: She is overweight, cooperative, alert, well developed, and in no acute distress. PSYCH: Has normal mood, affect and thought process.   HEENT: EOMI, sclerae are anicteric. Lungs: Normal breathing effort, no conversational dyspnea. Extremities: No edema.  Neurologic: No gross sensory or motor deficits. No tremors or fasciculations noted.    DIAGNOSTIC DATA REVIEWED:  BMET    Component Value Date/Time   NA 141 03/25/2023 1434   K 4.4 03/25/2023 1434   CL 102 03/25/2023 1434   CO2 20 03/25/2023 1434   GLUCOSE 90 03/25/2023 1434   GLUCOSE 85 06/23/2021 1136   BUN 12 03/25/2023 1434   CREATININE 0.86  03/25/2023 1434   CALCIUM 9.5 03/25/2023 1434   GFRNONAA 76 12/10/2020 1251   GFRAA 87 12/10/2020 1251   Lab Results  Component Value Date   HGBA1C 5.3 08/19/2022   Lab Results  Component Value Date   INSULIN 25.0 (H) 02/23/2023   Lab Results  Component Value Date   TSH 1.690 02/23/2023   CBC    Component Value Date/Time   WBC 6.3 10/27/2022 1056   WBC 7.3 08/30/2014 1031   RBC 4.77 10/27/2022 1056   RBC 4.07 08/30/2014 1031   HGB 14.8 10/27/2022 1056   HCT 45.3 10/27/2022 1056   PLT 279 10/27/2022 1056   MCV 95 10/27/2022 1056   MCH 31.0 10/27/2022 1056   MCH 31.4 08/30/2014 1031   MCHC 32.7 10/27/2022 1056   MCHC 33.9 08/30/2014 1031   RDW 12.1 10/27/2022 1056   Iron Studies    Component Value Date/Time   IRON 68 10/27/2022 1056   TIBC 313 10/27/2022 1056   FERRITIN 41 10/27/2022 1056   IRONPCTSAT 22 10/27/2022 1056   Lipid Panel     Component Value Date/Time   CHOL 149 02/23/2023 1305   TRIG 72 02/23/2023 1305   HDL 50 02/23/2023 1305   LDLCALC 85 02/23/2023 1305   Hepatic Function Panel     Component Value Date/Time   PROT 6.7 03/25/2023 1434   ALBUMIN 4.1 03/25/2023 1434  AST 21 03/25/2023 1434   ALT 38 (H) 03/25/2023 1434   ALKPHOS 97 03/25/2023 1434   BILITOT 0.2 03/25/2023 1434      Component Value Date/Time   TSH 1.690 02/23/2023 1305   Nutritional Lab Results  Component Value Date   VD25OH 55.6 02/23/2023   VD25OH 38.1 09/23/2021   VD25OH 34.1 12/22/2016     Return in about 4 weeks (around 08/02/2023) for For Weight Mangement with Dr. Rikki Spearing.Marland Kitchen She was informed of the importance of frequent follow up visits to maximize her success with intensive lifestyle modifications for her multiple health conditions.   ATTESTASTION STATEMENTS:  Reviewed by clinician on day of visit: allergies, medications, problem list, medical history, surgical history, family history, social history, and previous encounter notes.     Worthy Rancher, MD

## 2023-07-22 DIAGNOSIS — G35 Multiple sclerosis: Secondary | ICD-10-CM | POA: Diagnosis not present

## 2023-07-22 DIAGNOSIS — M6281 Muscle weakness (generalized): Secondary | ICD-10-CM | POA: Diagnosis not present

## 2023-07-29 DIAGNOSIS — F411 Generalized anxiety disorder: Secondary | ICD-10-CM | POA: Diagnosis not present

## 2023-07-29 DIAGNOSIS — F330 Major depressive disorder, recurrent, mild: Secondary | ICD-10-CM | POA: Diagnosis not present

## 2023-08-05 ENCOUNTER — Ambulatory Visit (INDEPENDENT_AMBULATORY_CARE_PROVIDER_SITE_OTHER): Payer: Medicare HMO | Admitting: Internal Medicine

## 2023-08-05 ENCOUNTER — Encounter (INDEPENDENT_AMBULATORY_CARE_PROVIDER_SITE_OTHER): Payer: Self-pay | Admitting: Family Medicine

## 2023-08-05 ENCOUNTER — Encounter (INDEPENDENT_AMBULATORY_CARE_PROVIDER_SITE_OTHER): Payer: Self-pay

## 2023-08-05 VITALS — BP 98/58 | HR 71 | Temp 97.4°F | Ht 68.0 in | Wt 271.0 lb

## 2023-08-05 DIAGNOSIS — R600 Localized edema: Secondary | ICD-10-CM | POA: Diagnosis not present

## 2023-08-05 DIAGNOSIS — I10 Essential (primary) hypertension: Secondary | ICD-10-CM | POA: Diagnosis not present

## 2023-08-05 DIAGNOSIS — Z6841 Body Mass Index (BMI) 40.0 and over, adult: Secondary | ICD-10-CM

## 2023-08-05 DIAGNOSIS — E88819 Insulin resistance, unspecified: Secondary | ICD-10-CM | POA: Diagnosis not present

## 2023-08-05 NOTE — Assessment & Plan Note (Signed)
 Her HOMA-IR is 4.8 which is elevated and suggest significant insulin resistance. Optimal level < 1.9.  Her hemoglobin A1c is normal.  This is complex condition associated with genetics, ectopic fat and lifestyle factors. Insulin resistance may result in weight gain, abnormal cravings (particularly for carbs) and fatigue. This may result in additional weight gain and lead to pre-diabetes and diabetes if untreated.   Lab Results  Component Value Date   HGBA1C 5.3 08/19/2022   Lab Results  Component Value Date   INSULIN 25.0 (H) 02/23/2023   Lab Results  Component Value Date   GLUCOSE 90 03/25/2023   GLUCOSE 91 08/30/2014   We also reviewed today the risk associated with high insulin levels including increased risk for cardiovascular disease, further weight gain, chronic inflammation and increased cancer risk.    She has been working with her caregiver to reduce simple sugars in her diet.  Patient continues to lose weight with nutritional and behavioral strategies.  She is also tolerating metformin 500 mg XR 1 tablet twice a day and will continue medication.

## 2023-08-05 NOTE — Assessment & Plan Note (Signed)
She has 3+ pitting edema to mid tibia.  She does not have congestive pulmonary symptoms.  She spends most of her day on the chair.  She is also on some medications that could worsen edema.  Because of her blood pressure being low I do not want her to take her furosemide which have been prescribed to use as needed.  I would like for her to elevate her legs for 15 minutes 3 times a day.  If her blood pressure improves and is above 100 she may take furosemide for 2 to 3 days along with her potassium.

## 2023-08-05 NOTE — Assessment & Plan Note (Signed)
Her blood pressure has decreased and has orthostasis while bending.  I recommend that she discontinue ARB.  She will also monitor blood pressure at home if her blood pressure starts to trend upward she can restart medication at a lower dose.  She will review this with her primary care doctor at the next office visit.

## 2023-08-05 NOTE — Progress Notes (Signed)
Office: [phone]  /  Fax: [phone]  WEIGHT SUMMARY AND BIOMETRICS  Vitals Temp: (!) 97.4 F (36.3 C) BP: (!) 98/58 Pulse Rate: 71 SpO2: 99 %   Anthropometric Measurements Height: 5\' 8"  (1.727 m) Weight: 271 lb (122.9 kg) BMI (Calculated): 41.22 Weight at Last Visit: 270 lb Weight Lost Since Last Visit: 0 Weight Gained Since Last Visit: 1 Starting Weight: 309 lb Total Weight Loss (lbs): 38 lb (17.2 kg)   No data recorded  No data recorded Today's Visit #: 8  Starting Date: 02/22/23   HPI  Chief Complaint: OBESITY  Ebony Warren is here to discuss her progress with her obesity treatment plan.   Interval History:  Since last office visit she has has gained 1 pound.  She acknowledges indulging in some sweets related to birthday celebrations.  She denies any medication changes.  Her blood pressure today is low and she acknowledges feeling lightheaded at times when she bends over.  She is on blood pressure medications.  She has lost 38 pounds.  She is currently on metformin without any adverse effects.  She is also on topiramate for her MS symptoms.  This is managed by her neurologist. She reports fair adherence to reduced calorie nutritional plan. She has been working on not skipping meals, increasing protein intake at every meal, eating more vegetables, and drinking more water  Orexigenic Control: Denies problems with appetite and hunger signals.  Denies problems with satiety and satiation.  Denies problems with eating patterns and portion control.  Denies abnormal cravings. Denies feeling deprived or restricted.   Barriers identified: low volume of physical acitivity, orthopedic problems, medical conditions or chronic pain affecting mobility, and presence of obesogenic drugs.   Pharmacotherapy for weight loss: She is currently taking Metformin (off label use for incretin effect and / or insulin resistance and / or diabetes prevention) with adequate clinical  response  and without side effects. and topiramate prescribed for MS symptoms. .    ASSESSMENT AND PLAN  TREATMENT PLAN FOR OBESITY:  Recommended Dietary Goals  Ebony Warren is currently in the action stage of change. As such, her goal is to continue weight management plan. She has agreed to: continue current plan  Behavioral Intervention  We discussed the following Behavioral Modification Strategies today: avoiding temptations and identifying enticing environmental cues and continue to work on implementation of reduced calorie nutritional plan.  Additional resources provided today: None  Recommended Physical Activity Goals  Ebony Warren has been advised to work up to 150 minutes of moderate intensity aerobic activity a week and strengthening exercises 2-3 times per week for cardiovascular health, weight loss maintenance and preservation of muscle mass.   She has agreed to :  Think about ways to increase daily physical activity and overcoming barriers to exercise  Pharmacotherapy We discussed various medication options to help Ebony Warren with her weight loss efforts and we both agreed to : continue current anti-obesity medication regimen  ASSOCIATED CONDITIONS ADDRESSED TODAY  Essential hypertension Assessment & Plan: Her blood pressure has decreased and has orthostasis while bending.  I recommend that she discontinue ARB.  She will also monitor blood pressure at home if her blood pressure starts to trend upward she can restart medication at a lower dose.  She will review this with her primary care doctor at the next office visit.   Class 3 severe obesity with serious comorbidity and body mass index (BMI) of 45.0 to 49.9 in adult, unspecified obesity type [facility]) Assessment & Plan: Ebony Warren has lost  approximately 40 pounds or 13% of baseline body weight.  She is now no longer in gabapentin and is on topiramate which may help with weight loss.  She remains on Abilify for her mood.  She  will continue with nutritional and behavioral strategies. She will continue on metformin for insulin resistance and incretin effect.  This will also help offset some of her medication induced weight gain.     Insulin resistance Assessment & Plan: Her HOMA-IR is 4.8 which is elevated and suggest significant insulin resistance. Optimal level < 1.9.  Her hemoglobin A1c is normal.  This is complex condition associated with genetics, ectopic fat and lifestyle factors. Insulin resistance may result in weight gain, abnormal cravings (particularly for carbs) and fatigue. This may result in additional weight gain and lead to pre-diabetes and diabetes if untreated.   Lab Results  Component Value Date   HGBA1C 5.3 08/19/2022   Lab Results  Component Value Date   INSULIN 25.0 (H) 02/23/2023   Lab Results  Component Value Date   GLUCOSE 90 03/25/2023   GLUCOSE 91 08/30/2014   We also reviewed today the risk associated with high insulin levels including increased risk for cardiovascular disease, further weight gain, chronic inflammation and increased cancer risk.    She has been working with her caregiver to reduce simple sugars in her diet.  Patient continues to lose weight with nutritional and behavioral strategies.  She is also tolerating metformin 500 mg XR 1 tablet twice a day and will continue medication.   Leg edema Assessment & Plan: She has 3+ pitting edema to mid tibia.  She does not have congestive pulmonary symptoms.  She spends most of her day on the chair.  She is also on some medications that could worsen edema.  Because of her blood pressure being low I do not want her to take her furosemide which have been prescribed to use as needed.  I would like for her to elevate her legs for 15 minutes 3 times a day.  If her blood pressure improves and is above 100 she may take furosemide for 2 to 3 days along with her potassium.     PHYSICAL EXAM:  Blood pressure (!) 98/58, pulse 71,  temperature (!) 97.4 F (36.3 C), height 5\' 8"  (1.727 m), weight 271 lb (122.9 kg), SpO2 99%. Body mass index is 41.21 kg/m.  General: She is overweight, cooperative, alert, well developed, and in no acute distress. PSYCH: Has normal mood, affect and thought process.   HEENT: EOMI, sclerae are anicteric. Lungs: Normal breathing effort, no conversational dyspnea. Extremities: No edema.  Neurologic: No gross sensory or motor deficits. No tremors or fasciculations noted.    DIAGNOSTIC DATA REVIEWED:  BMET    Component Value Date/Time   NA 141 03/25/2023 1434   K 4.4 03/25/2023 1434   CL 102 03/25/2023 1434   CO2 20 03/25/2023 1434   GLUCOSE 90 03/25/2023 1434   GLUCOSE 85 06/23/2021 1136   BUN 12 03/25/2023 1434   CREATININE 0.86 03/25/2023 1434   CALCIUM 9.5 03/25/2023 1434   GFRNONAA 76 12/10/2020 1251   GFRAA 87 12/10/2020 1251   Lab Results  Component Value Date   HGBA1C 5.3 08/19/2022   Lab Results  Component Value Date   INSULIN 25.0 (H) 02/23/2023   Lab Results  Component Value Date   TSH 1.690 02/23/2023   CBC    Component Value Date/Time   WBC 6.3 10/27/2022 1056   WBC 7.3 08/30/2014  1031   RBC 4.77 10/27/2022 1056   RBC 4.07 08/30/2014 1031   HGB 14.8 10/27/2022 1056   HCT 45.3 10/27/2022 1056   PLT 279 10/27/2022 1056   MCV 95 10/27/2022 1056   MCH 31.0 10/27/2022 1056   MCH 31.4 08/30/2014 1031   MCHC 32.7 10/27/2022 1056   MCHC 33.9 08/30/2014 1031   RDW 12.1 10/27/2022 1056   Iron Studies    Component Value Date/Time   IRON 68 10/27/2022 1056   TIBC 313 10/27/2022 1056   FERRITIN 41 10/27/2022 1056   IRONPCTSAT 22 10/27/2022 1056   Lipid Panel     Component Value Date/Time   CHOL 149 02/23/2023 1305   TRIG 72 02/23/2023 1305   HDL 50 02/23/2023 1305   LDLCALC 85 02/23/2023 1305   Hepatic Function Panel     Component Value Date/Time   PROT 6.7 03/25/2023 1434   ALBUMIN 4.1 03/25/2023 1434   AST 21 03/25/2023 1434   ALT 38 (H)  03/25/2023 1434   ALKPHOS 97 03/25/2023 1434   BILITOT 0.2 03/25/2023 1434      Component Value Date/Time   TSH 1.690 02/23/2023 1305   Nutritional Lab Results  Component Value Date   VD25OH 55.6 02/23/2023   VD25OH 38.1 09/23/2021   VD25OH 34.1 12/22/2016     Return for Week of Sept 30th - 5 weeks - with Dr. Rikki Spearing.Marland Kitchen She was informed of the importance of frequent follow up visits to maximize her success with intensive lifestyle modifications for her multiple health conditions.   ATTESTASTION STATEMENTS:  Reviewed by clinician on day of visit: allergies, medications, problem list, medical history, surgical history, family history, social history, and previous encounter notes.     Worthy Rancher, MD

## 2023-08-05 NOTE — Assessment & Plan Note (Signed)
Ebony Warren has lost approximately 40 pounds or 13% of baseline body weight.  She is now no longer in gabapentin and is on topiramate which may help with weight loss.  She remains on Abilify for her mood.  She will continue with nutritional and behavioral strategies. She will continue on metformin for insulin resistance and incretin effect.  This will also help offset some of her medication induced weight gain.

## 2023-08-12 ENCOUNTER — Other Ambulatory Visit: Payer: Self-pay

## 2023-08-12 ENCOUNTER — Other Ambulatory Visit (INDEPENDENT_AMBULATORY_CARE_PROVIDER_SITE_OTHER): Payer: Self-pay | Admitting: Internal Medicine

## 2023-08-12 DIAGNOSIS — E88819 Insulin resistance, unspecified: Secondary | ICD-10-CM

## 2023-08-12 DIAGNOSIS — U071 COVID-19: Secondary | ICD-10-CM

## 2023-08-12 MED ORDER — PANTOPRAZOLE SODIUM 40 MG PO TBEC: 40.0000 mg | DELAYED_RELEASE_TABLET | Freq: Every day | ORAL | 3 refills | Status: DC

## 2023-09-14 ENCOUNTER — Ambulatory Visit (INDEPENDENT_AMBULATORY_CARE_PROVIDER_SITE_OTHER): Payer: Medicare HMO | Admitting: Internal Medicine

## 2023-09-14 ENCOUNTER — Encounter (INDEPENDENT_AMBULATORY_CARE_PROVIDER_SITE_OTHER): Payer: Self-pay | Admitting: Internal Medicine

## 2023-09-14 VITALS — BP 126/85 | HR 70 | Temp 98.8°F | Ht 68.0 in | Wt 261.0 lb

## 2023-09-14 DIAGNOSIS — E88819 Insulin resistance, unspecified: Secondary | ICD-10-CM

## 2023-09-14 DIAGNOSIS — Z6841 Body Mass Index (BMI) 40.0 and over, adult: Secondary | ICD-10-CM | POA: Diagnosis not present

## 2023-09-14 DIAGNOSIS — R600 Localized edema: Secondary | ICD-10-CM

## 2023-09-14 DIAGNOSIS — E66813 Obesity, class 3: Secondary | ICD-10-CM

## 2023-09-14 MED ORDER — METFORMIN HCL ER 500 MG PO TB24: 500.0000 mg | ORAL_TABLET | Freq: Two times a day (BID) | ORAL | 0 refills | Status: DC

## 2023-09-14 NOTE — Assessment & Plan Note (Signed)
Ebony Warren has lost approximately 50 pounds or 16% of baseline body weight.  She is now no longer in gabapentin and is on topiramate which I think has helped with weight loss.  She remains on Abilify for her mood.  She will continue with nutritional and behavioral strategies. She will continue on metformin for insulin resistance and incretin effect.  This will also help offset some of her medication induced weight gain.

## 2023-09-14 NOTE — Progress Notes (Signed)
[address] [place], [place], [place] [address] Office: [phone]  /  Fax: [phone]   WEIGHT SUMMARY AND BIOMETRICS  Vitals Temp: 98.8 F (37.1 C) BP: 126/85 Pulse Rate: 70 SpO2: 98 %   Anthropometric Measurements Height: 5\' 8"  (1.727 m) Weight: 261 lb (118.4 kg) BMI (Calculated): 39.69 Weight at Last Visit: 271 lb Weight Lost Since Last Visit: 10 lb Weight Gained Since Last Visit: 0 Starting Weight: 309 lb Total Weight Loss (lbs): 48 lb (21.8 kg)   No data recorded   No data recorded Today's Visit #: 9   Starting Date: 02/22/23    Chief Complaint: OBESITY  HPI  Discussed the use of AI scribe software for clinical note transcription with the patient, who gave verbal consent to proceed.  History of Present Illness    The patient, diagnosed with obesity and multiple sclerosis, presents for a follow-up visit. She has been adhering to a category two meal plan about fifty percent of the time and has incorporated stretching exercises into her routine twice a week for ninety minutes each session. Since the last visit, she has lost ten pounds.  However, the past two and a half weeks have been challenging due to her sixteen-year-old son's bacterial pneumonia. The patient's son also has asthma, which complicated the situation and required constant care, including nebulization and steam showers. Despite these challenges, the patient managed to maintain her diet, even when eating out, by making healthier choices and watching portion sizes.  The patient has been experiencing issues with bladder control, particularly when consuming a lot of water. This has led to a strategy of sipping water throughout the day to maintain control. She has not been experiencing any problems with her current medication, metformin, and has not reported any loose stools.  The patient also reports swelling in her legs, particularly in her feet, which she attributes to her menstrual cycle. She has  been using compression stockings to manage the swelling and has been elevating her feet three times a day for about fifteen minutes each time. Despite these challenges, the patient has made significant progress in her weight loss journey, losing fifty pounds since her last visit.      Barriers identified: having difficulty with meal prep and planning, orthopedic problems, medical conditions or chronic pain affecting mobility, and medical comorbidities.   Pharmacotherapy for weight loss: She is currently taking Metformin (off label use for incretin effect and / or insulin resistance and / or diabetes prevention) with adequate clinical response  and without side effects. and Topiramate (off label use, single agent) with adequate clinical response  and without side effects..    ASSESSMENT AND PLAN  TREATMENT PLAN FOR OBESITY:  Recommended Dietary Goals  Ebony Warren is currently in the action stage of change. As such, her goal is to continue weight management plan. She has agreed to: continue current plan  Behavioral Intervention  We discussed the following Behavioral Modification Strategies today: continue to work on maintaining a reduced calorie state, getting the recommended amount of protein, incorporating whole foods, making healthy choices, staying well hydrated and practicing mindfulness when eating..  Additional resources provided today: None  Recommended Physical Activity Goals  Ebony Warren has been advised to work up to 150 minutes of moderate intensity aerobic activity a week and strengthening exercises 2-3 times per week for cardiovascular health, weight loss maintenance and preservation of muscle mass.   She has agreed to :  Think about enjoyable ways to increase daily physical activity and overcoming  barriers to exercise and Increase physical activity in their day and reduce sedentary time (increase NEAT).  Pharmacotherapy We discussed various medication options to help Ebony Warren  with her weight loss efforts and we both agreed to : continue with nutritional and behavioral strategies  ASSOCIATED CONDITIONS ADDRESSED TODAY  Class 3 severe obesity with serious comorbidity and body mass index (BMI) of 45.0 to 49.9 in adult, unspecified obesity type Ebony Warren) Assessment & Plan: Ebony Warren has lost approximately 50 pounds or 16% of baseline body weight.  She is now no longer in gabapentin and is on topiramate which I think has helped with weight loss.  She remains on Abilify for her mood.  She will continue with nutritional and behavioral strategies. She will continue on metformin for insulin resistance and incretin effect.  This will also help offset some of her medication induced weight gain.     Insulin resistance Assessment & Plan: Her HOMA-IR is 4.8 which is elevated and suggest significant insulin resistance. Optimal level < 1.9.  Her hemoglobin A1c is normal.  This is complex condition associated with genetics, ectopic fat and lifestyle factors. Insulin resistance may result in weight gain, abnormal cravings (particularly for carbs) and fatigue. This may result in additional weight gain and lead to pre-diabetes and diabetes if untreated.   Lab Results  Component Value Date   HGBA1C 5.3 08/19/2022   Lab Results  Component Value Date   INSULIN 25.0 (H) 02/23/2023   Lab Results  Component Value Date   GLUCOSE 90 03/25/2023   GLUCOSE 91 08/30/2014   We also reviewed today the risk associated with high insulin levels including increased risk for cardiovascular disease, further weight gain, chronic inflammation and increased cancer risk.    She has been working with her caregiver to reduce simple sugars in her diet.  Patient continues to lose weight with nutritional and behavioral strategies.  She is also tolerating metformin 500 mg XR 1 tablet twice a day and will continue medication.  Orders: -     metFORMIN HCl ER; Take 1 tablet (500 mg total) by mouth 2 (two) times  daily with a meal.  Dispense: 180 tablet; Refill: 0  Leg edema Assessment & Plan: Her leg edema is chronic and fluctuates.  She keeps her legs hanging during the day she does have compression socks and edema wear at home.  We discussed application of edema wear in the morning and to also elevate legs for 15 minutes 3 times a day if possible.     PHYSICAL EXAM:  Blood pressure 126/85, pulse 70, temperature 98.8 F (37.1 C), height 5\' 8"  (1.727 m), weight 261 lb (118.4 kg), SpO2 98%. Body mass index is 39.68 kg/m.  General: She is overweight, cooperative, alert, well developed, and in no acute distress. PSYCH: Has normal mood, affect and thought process.   HEENT: EOMI, sclerae are anicteric. Lungs: Normal breathing effort, no conversational dyspnea. Extremities: No edema.  Neurologic: No gross sensory or motor deficits. No tremors or fasciculations noted.    DIAGNOSTIC DATA REVIEWED:  BMET    Component Value Date/Time   NA 141 03/25/2023 1434   K 4.4 03/25/2023 1434   CL 102 03/25/2023 1434   CO2 20 03/25/2023 1434   GLUCOSE 90 03/25/2023 1434   GLUCOSE 85 06/23/2021 1136   BUN 12 03/25/2023 1434   CREATININE 0.86 03/25/2023 1434   CALCIUM 9.5 03/25/2023 1434   GFRNONAA 76 12/10/2020 1251   GFRAA 87 12/10/2020 1251   Lab Results  Component Value Date   HGBA1C 5.3 08/19/2022   Lab Results  Component Value Date   INSULIN 25.0 (H) 02/23/2023   Lab Results  Component Value Date   TSH 1.690 02/23/2023   CBC    Component Value Date/Time   WBC 6.3 10/27/2022 1056   WBC 7.3 08/30/2014 1031   RBC 4.77 10/27/2022 1056   RBC 4.07 08/30/2014 1031   HGB 14.8 10/27/2022 1056   HCT 45.3 10/27/2022 1056   PLT 279 10/27/2022 1056   MCV 95 10/27/2022 1056   MCH 31.0 10/27/2022 1056   MCH 31.4 08/30/2014 1031   MCHC 32.7 10/27/2022 1056   MCHC 33.9 08/30/2014 1031   RDW 12.1 10/27/2022 1056   Iron Studies    Component Value Date/Time   IRON 68 10/27/2022 1056    TIBC 313 10/27/2022 1056   FERRITIN 41 10/27/2022 1056   IRONPCTSAT 22 10/27/2022 1056   Lipid Panel     Component Value Date/Time   CHOL 149 02/23/2023 1305   TRIG 72 02/23/2023 1305   HDL 50 02/23/2023 1305   LDLCALC 85 02/23/2023 1305   Lab Results  Component Value Date   CHOL 149 02/23/2023   HDL 50 02/23/2023   LDLCALC 85 02/23/2023   TRIG 72 02/23/2023   Hepatic Function Panel     Component Value Date/Time   PROT 6.7 03/25/2023 1434   ALBUMIN 4.1 03/25/2023 1434   AST 21 03/25/2023 1434   ALT 38 (H) 03/25/2023 1434   ALKPHOS 97 03/25/2023 1434   BILITOT 0.2 03/25/2023 1434      Component Value Date/Time   TSH 1.690 02/23/2023 1305   Nutritional Lab Results  Component Value Date   VD25OH 55.6 02/23/2023   VD25OH 38.1 09/23/2021   VD25OH 34.1 12/22/2016     Return in about 4 weeks (around 10/12/2023) for For Weight Mangement with Dr. Rikki Spearing.Marland Kitchen She was informed of the importance of frequent follow up visits to maximize her success with intensive lifestyle modifications for her multiple health conditions.   ATTESTASTION STATEMENTS:  Reviewed by clinician on day of visit: allergies, medications, problem list, medical history, surgical history, family history, social history, and previous encounter notes.     Worthy Rancher, MD

## 2023-09-14 NOTE — Assessment & Plan Note (Signed)
Her leg edema is chronic and fluctuates.  She keeps her legs hanging during the day she does have compression socks and edema wear at home.  We discussed application of edema wear in the morning and to also elevate legs for 15 minutes 3 times a day if possible.

## 2023-09-14 NOTE — Assessment & Plan Note (Signed)
Her HOMA-IR is 4.8 which is elevated and suggest significant insulin resistance. Optimal level < 1.9.  Her hemoglobin A1c is normal.  This is complex condition associated with genetics, ectopic fat and lifestyle factors. Insulin resistance may result in weight gain, abnormal cravings (particularly for carbs) and fatigue. This may result in additional weight gain and lead to pre-diabetes and diabetes if untreated.   Lab Results  Component Value Date   HGBA1C 5.3 08/19/2022   Lab Results  Component Value Date   INSULIN 25.0 (H) 02/23/2023   Lab Results  Component Value Date   GLUCOSE 90 03/25/2023   GLUCOSE 91 08/30/2014   We also reviewed today the risk associated with high insulin levels including increased risk for cardiovascular disease, further weight gain, chronic inflammation and increased cancer risk.    She has been working with her caregiver to reduce simple sugars in her diet.  Patient continues to lose weight with nutritional and behavioral strategies.  She is also tolerating metformin 500 mg XR 1 tablet twice a day and will continue medication.

## 2023-09-16 ENCOUNTER — Ambulatory Visit (INDEPENDENT_AMBULATORY_CARE_PROVIDER_SITE_OTHER): Payer: Medicare HMO | Admitting: Internal Medicine

## 2023-10-14 ENCOUNTER — Ambulatory Visit (INDEPENDENT_AMBULATORY_CARE_PROVIDER_SITE_OTHER): Payer: Medicare HMO | Admitting: Internal Medicine

## 2023-10-14 ENCOUNTER — Encounter (INDEPENDENT_AMBULATORY_CARE_PROVIDER_SITE_OTHER): Payer: Self-pay | Admitting: Internal Medicine

## 2023-10-14 DIAGNOSIS — E88819 Insulin resistance, unspecified: Secondary | ICD-10-CM | POA: Diagnosis not present

## 2023-10-14 DIAGNOSIS — E669 Obesity, unspecified: Secondary | ICD-10-CM

## 2023-10-14 DIAGNOSIS — Z6839 Body mass index (BMI) 39.0-39.9, adult: Secondary | ICD-10-CM

## 2023-10-14 DIAGNOSIS — R600 Localized edema: Secondary | ICD-10-CM

## 2023-10-14 DIAGNOSIS — R7401 Elevation of levels of liver transaminase levels: Secondary | ICD-10-CM

## 2023-10-14 MED ORDER — METFORMIN HCL ER 500 MG PO TB24: 500.0000 mg | ORAL_TABLET | Freq: Two times a day (BID) | ORAL | 0 refills | Status: DC

## 2023-10-14 NOTE — Progress Notes (Signed)
Office: [phone]  /  Fax: [phone]  WEIGHT SUMMARY AND BIOMETRICS  Vitals Temp: 97.6 F (36.4 C) BP: 119/79 Pulse Rate: 80 SpO2: 100 %   Anthropometric Measurements Height: 5\' 8"  (1.727 m) Weight: 258 lb (117 kg) BMI (Calculated): 39.24 Weight at Last Visit: 261 lb Weight Lost Since Last Visit: 3 lb Starting Weight: 309 lb Total Weight Loss (lbs): 51 lb (23.1 kg)   No data recorded  No data recorded Today's Visit #: 10  Starting Date: 02/22/23   HPI  Chief Complaint: OBESITY  Ebony Warren is here to discuss her progress with her obesity treatment plan. She is on the the Category 2 Plan and states she is following her eating plan approximately 30 % of the time. She states she is exercising 60 minutes 2 times per week.  Interval History:  Discussed the use of AI scribe software for clinical note transcription with the patient, who gave verbal consent to proceed.  History of Present Illness   Ebony Warren, a 47 year old individual with a history of high blood pressure, insulin resistance, multiple sclerosis, and depression, presents for obesity management. She is currently on Abilify, which may contribute to weight gain, duloxetine, and metformin for insulin resistance and weight management. Since the last visit, she has lost three pounds.  Ebony Warren reports adherence to a diet of three meals a day, with a focus on protein intake. However, she admits to a recent indulgence in ice cream and cake due to her birthday celebrations. She denies any side effects from metformin.  She reports occasional loud snoring, particularly when lying flat on her back, but denies any observed apneas or waking up gasping for air. She does admit to easily falling asleep during passive activities such as watching TV or riding in a car. She sleeps at an angle to mitigate the snoring.  Ebony Warren has noticed improvements in her clothing fit with weight loss, but reports persistent weakness in  her legs. She has noticed some improvement in leg swelling, but it remains a concern. She has been implementing leg elevation for about 15 minutes daily to manage this.  She has lost a total of 48 pounds, which is 17% of her body weight. Despite this significant weight loss, she reports that her mobility has not improved significantly, although transferring has become easier. She is currently taking metformin, duloxetine, and atopyramide, and reports no issues with these medications. She has not had any imaging of her abdomen despite mild elevation in liver enzymes.      Ebony Warren: Denies problems with appetite and hunger signals.  Denies problems with satiety and satiation.  Denies problems with eating patterns and portion Warren.  Denies abnormal cravings. Denies feeling deprived or restricted.   Barriers identified: none and orthopedic problems, medical conditions or chronic pain affecting mobility.   Pharmacotherapy for weight loss: She is currently taking Metformin (off label use for incretin effect and / or insulin resistance and / or diabetes prevention) with adequate clinical response  and without side effects. and Topiramate (off label use, single agent) with adequate clinical response  and without side effects..    ASSESSMENT AND PLAN  TREATMENT PLAN FOR OBESITY:  Recommended Dietary Goals  Ebony Warren is currently in the action stage of change. As such, her goal is to continue weight management plan. She has agreed to: continue current plan  Behavioral Intervention  We discussed the following Behavioral Modification Strategies today: continue to work on maintaining a reduced calorie state, getting the recommended amount  of protein, incorporating whole foods, making healthy choices, staying well hydrated and practicing mindfulness when eating..  Additional resources provided today: None  Recommended Physical Activity Goals  Ebony Warren has been advised to work up to  150 minutes of moderate intensity aerobic activity a week and strengthening exercises 2-3 times per week for cardiovascular health, weight loss maintenance and preservation of muscle mass.   She has agreed to :  Think about enjoyable ways to increase daily physical activity and overcoming barriers to exercise and Increase physical activity in their day and reduce sedentary time (increase NEAT).  Pharmacotherapy We discussed various medication options to help Ebony Warren with her weight loss efforts and we both agreed to : continue with nutritional and behavioral strategies  ASSOCIATED CONDITIONS ADDRESSED TODAY  Assessment and Plan    Obesity Ebony Warren has made significant progress in obesity management, losing three pounds since the last visit for a total weight loss of 48 pounds, or 17% of body weight. This achievement has notably improved clothing fit and ease of transferring, although leg weakness remains an issue. Ebony Warren's regimen includes metformin for insulin resistance and weight management, alongside Abilify, which may contribute to weight gain, and duloxetine. We discussed the importance of maintaining adequate protein intake to prevent muscle loss and boost metabolism, recommending sources such as eggs, yogurt, protein oats, protein waffles, and protein cereals. We will continue metformin at the current dose, send in a 59-month refill for metformin, and ensure adequate protein intake. Follow-up is scheduled in one month.  Insulin Resistance Ebony Warren's insulin levels were elevated, with metformin being used to manage this condition and to offset potential weight gain from Abilify. Her last A1c, recorded in September of the previous year, was 5.3. We plan to check fasting insulin levels and liver enzymes at the next visit to monitor improvement and will recheck A1c at the same time.  Elevated Liver Enzymes A mild elevation in liver enzymes (ALT 38, normal <32) was noted in March,  possibly due to medication effects or fatty liver, with no risk factors for hepatitis B or C identified. We plan to recheck liver enzymes while fasting at the next visit to monitor improvement with weight loss.  Leg Edema Ebony Warren's leg swelling persists but has shown improvement, likely due to venous insufficiency exacerbated by muscle weakness. The use of compression socks and leg elevation for 15 minutes, three times a day, have been beneficial. We explained that venous insufficiency results from weak muscles not aiding blood return, leading to fluid buildup and swelling.  Sleep Apnea (suspected) Ebony Warren experiences occasional loud snoring, particularly when lying flat, with no reported episodes of apnea or waking up choking. Her neck circumference is 15.5 inches, and she has a crowded throat (Mallampati score of 3). Weight loss has likely improved these symptoms, and no further action is planned unless symptoms worsen. We discussed how sleep apnea, common in multiple sclerosis, can affect memory, mood, energy levels, and metabolism, advising to monitor for any new symptoms such as pauses in breathing or frequent waking at night.  General Health Maintenance We reiterated the importance of adequate protein intake with each meal to prevent muscle loss and boost metabolism.  Follow-up Ebony Warren is scheduled to follow up in one month and should come fasting to check insulin levels and liver enzymes.        PHYSICAL EXAM:  Blood pressure 119/79, pulse 80, temperature 97.6 F (36.4 C), height 5\' 8"  (1.727 m), weight 258 lb (117 kg), SpO2 100%. Body mass index  is 39.23 kg/m.  General: She is overweight, cooperative, alert, well developed, and in no acute distress. PSYCH: Has normal mood, affect and thought process.   HEENT: EOMI, sclerae are anicteric. Lungs: Normal breathing effort, no conversational dyspnea. Extremities: No edema.  Neurologic: No gross sensory or motor deficits. No  tremors or fasciculations noted.    DIAGNOSTIC DATA REVIEWED:  BMET    Component Value Date/Time   NA 141 03/25/2023 1434   K 4.4 03/25/2023 1434   CL 102 03/25/2023 1434   CO2 20 03/25/2023 1434   GLUCOSE 90 03/25/2023 1434   GLUCOSE 85 06/23/2021 1136   BUN 12 03/25/2023 1434   CREATININE 0.86 03/25/2023 1434   CALCIUM 9.5 03/25/2023 1434   GFRNONAA 76 12/10/2020 1251   GFRAA 87 12/10/2020 1251   Lab Results  Component Value Date   HGBA1C 5.3 08/19/2022   Lab Results  Component Value Date   INSULIN 25.0 (H) 02/23/2023   Lab Results  Component Value Date   TSH 1.690 02/23/2023   CBC    Component Value Date/Time   WBC 6.3 10/27/2022 1056   WBC 7.3 08/30/2014 1031   RBC 4.77 10/27/2022 1056   RBC 4.07 08/30/2014 1031   HGB 14.8 10/27/2022 1056   HCT 45.3 10/27/2022 1056   PLT 279 10/27/2022 1056   MCV 95 10/27/2022 1056   MCH 31.0 10/27/2022 1056   MCH 31.4 08/30/2014 1031   MCHC 32.7 10/27/2022 1056   MCHC 33.9 08/30/2014 1031   RDW 12.1 10/27/2022 1056   Iron Studies    Component Value Date/Time   IRON 68 10/27/2022 1056   TIBC 313 10/27/2022 1056   FERRITIN 41 10/27/2022 1056   IRONPCTSAT 22 10/27/2022 1056   Lipid Panel     Component Value Date/Time   CHOL 149 02/23/2023 1305   TRIG 72 02/23/2023 1305   HDL 50 02/23/2023 1305   LDLCALC 85 02/23/2023 1305   Hepatic Function Panel     Component Value Date/Time   PROT 6.7 03/25/2023 1434   ALBUMIN 4.1 03/25/2023 1434   AST 21 03/25/2023 1434   ALT 38 (H) 03/25/2023 1434   ALKPHOS 97 03/25/2023 1434   BILITOT 0.2 03/25/2023 1434      Component Value Date/Time   TSH 1.690 02/23/2023 1305   Nutritional Lab Results  Component Value Date   VD25OH 55.6 02/23/2023   VD25OH 38.1 09/23/2021   VD25OH 34.1 12/22/2016     Return in about 4 weeks (around 11/11/2023) for For Weight Mangement with Dr. Rikki Spearing - fasting for labs.. She was informed of the importance of frequent follow up visits  to maximize her success with intensive lifestyle modifications for her multiple health conditions.   ATTESTASTION STATEMENTS:  Reviewed by clinician on day of visit: allergies, medications, problem list, medical history, surgical history, family history, social history, and previous encounter notes.     Worthy Rancher, MD

## 2023-10-18 ENCOUNTER — Ambulatory Visit (INDEPENDENT_AMBULATORY_CARE_PROVIDER_SITE_OTHER): Payer: Medicare HMO | Admitting: Diagnostic Neuroimaging

## 2023-10-18 ENCOUNTER — Encounter: Payer: Self-pay | Admitting: Diagnostic Neuroimaging

## 2023-10-18 VITALS — BP 138/85 | HR 68

## 2023-10-18 DIAGNOSIS — G35 Multiple sclerosis: Secondary | ICD-10-CM

## 2023-10-18 MED ORDER — BACLOFEN 20 MG PO TABS: 20.0000 mg | ORAL_TABLET | Freq: Three times a day (TID) | ORAL | 4 refills | Status: DC

## 2023-10-18 MED ORDER — TOPIRAMATE 50 MG PO TABS: 50.0000 mg | ORAL_TABLET | Freq: Two times a day (BID) | ORAL | 4 refills | Status: DC

## 2023-10-18 MED ORDER — TIZANIDINE HCL 4 MG PO TABS: 4.0000 mg | ORAL_TABLET | Freq: Three times a day (TID) | ORAL | 4 refills | Status: DC

## 2023-10-18 NOTE — Progress Notes (Signed)
[facility]  PATIENT: Ebony Warren DOB: [birth year]  REFERRING CLINICIAN: Mercie Eon, MD   HISTORY FROM: patient  REASON FOR VISIT: follow up   HISTORICAL  CHIEF COMPLAINT:  Chief Complaint  Patient presents with   Follow-up    Patient in room #7 and alone. Patient states she is well and stable, with no new concerns.    HISTORY OF PRESENT ILLNESS:   UPDATE (10/18/23, VRP): Since last visit, doing well. Symptoms are stable. Severity is moderate. No alleviating or aggravating factors. Tolerating meds.    UPDATE (02/10/23, VRP): Since last visit, doing well. Symptoms are STABLE. Severity is moderate. No alleviating or aggravating factors. Tolerating ocrevus and other meds.    UPDATE (05/26/22, VRP): Since last visit, doing well. Symptoms are stable. No alleviating or aggravating factors. Tolerating ocrevus. Planning to go on cruise vacation in April 2024.   UPDATE (09/23/21, VRP): Since last visit, slightly more balance and gait issues. Has power chair, but it was defective on arrival, now waiting for repair. Tolerating meds.   UPDATE (03/31/21, VRP): Since last visit, here for evaluation for electric wheelchair / power chair again. Symptoms are progressive. Severity is moderate. No alleviating or aggravating factors. Tolerating meds / ocrevus. Here for face-face evaluation and mobility assessment for power wheelchair. Has multiple sclerosis and hemiplegia. Cannot perform ADLs due to mobility issues. Cannot propel manual wheelchair due to hand weakness. Living at home with son and mother. Has personal care aid. Not driving currently.   UPDATE (12/10/20, VRP): Since last visit, more left leg weakness and gait difficulty. Requesting evaluation for electric wheelchair / power chair. Symptoms are progressive. Severity is moderate. No alleviating or aggravating factors. Tolerating meds / ocrevus. Here for face-face evaluation of power wheelchair. Cannot perform  ADLs due to mobility issues. Cannot propel manual wheelchair due to hand weakness. Living at home with son and mother. Has personal care aid. Not driving currently.   UPDATE (05/21/20, VRP): Since last visit, doing fair. More depression, spasms. Had event last week of body trembling, no LOC, lasting 10 minutes. Symptoms are progressive. Tolerating ocrevus and muscle relaxers.  UPDATE (11/20/19, VRP): Here for face-face evaluation of wheelchair need. Currently using a wheelchair due to MS and mobility impairment, now needs a replacement. Patient able to propel wheelchair. Overall, since last visit, doing fair. Continues on ocrevus; had mild reaction at IV site last time. No other alleviating or aggravating factors. Tolerating meds.    UPDATE (10/25/18, VRP): Since last visit, doing about the same. Symptoms are stable EXCEPT right hand weakness and coordination is worse. Some right shoulder twitching. Memory loss continues. No alleviating or aggravating factors. Tolerating meds.    UPDATE (04/18/18, VRP): Since last visit, doing well on ocrevus, except had another fall. Recently more urine incont, and right leg swelling. Tolerating meds. No alleviating or aggravating factors.   UPDATE (10/18/17, VRP): Since last visit, doing well. Tolerating ocrevus. No alleviating or aggravating factors. No new events. C/o cough and dry mouth issues.   UPDATE 05/19/17: Since last visit, now transitioned to ocrevus (May 21, June 4). Did well with infusion. Spasms stable. Left leg slightly better. Bladder slightly better. Depression stable. Using rollator walker. No other triggering factors. Overall doing well.   UPDATE 02/08/17: Since last visit, MRI brain shows 1 new lesion. Here to discuss ocrevus. Also with more left leg pain, muscle spasms, gait diff, fatigue. Right ear pain better. Bladder incont slightly better with oxybutinin initially, but not anymore.  UPDATE 12/22/16: Since last visit, was doing well until Nov  2017 and then developed stabbing right ear pain (continues to present). Also with new and increasing spasm and cramps in right hand and left leg since Dec 2017. Continues with fatigue and bladder incontinence.  UPDATE 05/13/16: Since last visit, doing well. No new MS events or symptoms. In retrospect, may have lost vision in right eye (2015, and possibly March 2016), lasting 3-7 days. Didn't mention to me until now. Recently this was noted optometry and ophthalmology exams.   UPDATE 01/27/16: Since last visit, fatigue improved with adjusting medication timing. Bladder issues stable. Had UTI at last visit, tx'd with abx, now better.  UPDATE 10/22/15: Since last visit was doing well. Then last week had new onset weakness in legs, bladder incontinence, strong smelling urine, and hand tremors. Now sxs almost fully resolved since yesterday.  UPDATE 05/01/15: Symptoms stable. Tolerating tecfidera.  UPDATE 01/25/15: Since last visit, had more problems for a few weeks (lower ext weakness, bladder incont), but now improved. Now on tecfidera x 3 weeks. Some itching and stomach issues with tecfidera, but mild. Main issues now consist of lower extremity pain, spasms, urinary leakage, daytime fatigue.  UPDATE 10/31/14: Since last visit, sxs are stable. Test results and diagnosis reviewed. No new events. Using cane to walk.  UPDATE (10/02/14): 47 year old right-handed female here for evaluation of double vision. 2008 patient was 6 months pregnant with her son, when all of a sudden she didn't feel good. She noticed right hand clumsiness and incoordination. This lasted for approximately 9 months and then stopped. She did not seek medical attention for this problem. 2013 patient had onset of right arm weakness, right leg weakness, numbness on the right side, intermittent shaking sensation all over. Patient was evaluated in [place] and diagnosed with possible stroke. 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 Patient did  not recover right arm or leg strength. Her speech was also affected. October 2014 patient moved to [facility]. 08/30/2014 patient was at home hanging clothes over the bathtub when she fell down. She is not sure what caused her fall. Her right leg may have given out. Patient fell and struck her head against the soap dish which broke off. Patient did not lose consciousness. Patient's family advised her to go to the emergency room. Patient was evaluated with CT and MRI of the brain. No acute findings were found. MRI of the brain did show multiple white matter lesions suspicious for chronic multiple sclerosis versus chronic small vessel ischemic disease. Advised to follow-up in outpatient basis. Since this fall patient has noticed some additional blurred vision, and double vision especially when looking to the left side.   REVIEW OF SYSTEMS: Full 14 system review of systems performed and negative except for: as per HPI.    ALLERGIES: Allergies  Allergen Reactions   Bioflavonoids Hives   Oxycodone-Acetaminophen Itching   Hydrocodone Itching    Stomach cramps   Morphine Itching   Orange Fruit [Citrus]     Acidic foods Makes her itchy and her face "feels like its on fire", have rash.   Oxycodone Itching    rash   Tomato Hives   Other Rash    Narcotics. Spicy foods (cause seizures)    HOME MEDICATIONS: Outpatient Medications Prior to Visit  Medication Sig Dispense Refill   ARIPiprazole (ABILIFY) 5 MG tablet Take 5 mg by mouth daily.     cholecalciferol (VITAMIN D) 1000 UNITS tablet Take 1,000 Units by mouth daily.  diphenhydrAMINE (BENADRYL) 25 mg capsule Take 25 mg by mouth every 6 (six) hours as needed for allergies.     DULoxetine (CYMBALTA) 30 MG capsule Take 1 capsule by mouth daily.     LORazepam (ATIVAN) 1 MG tablet Take 1 tablet (1 mg total) by mouth daily. 14 tablet 0   losartan (COZAAR) 50 MG tablet Take 1 tablet (50 mg total) by mouth daily. 90 tablet 3   metFORMIN  (GLUCOPHAGE-XR) 500 MG 24 hr tablet Take 1 tablet (500 mg total) by mouth 2 (two) times daily with a meal. 180 tablet 0   Multiple Vitamins-Minerals (MULTIVITAMIN ADULT PO) Take 1 tablet by mouth daily.     ocrelizumab (OCREVUS) 300 MG/10ML injection Infuse 600 mg IV Every 6 months 20 mL 1   oxybutynin (DITROPAN-XL) 10 MG 24 hr tablet Take 1 tablet (10 mg total) by mouth daily as needed (to prevent incontinence). 90 tablet 3   pantoprazole (PROTONIX) 40 MG tablet Take 1 tablet (40 mg total) by mouth daily. 90 tablet 3   polyethylene glycol powder (GLYCOLAX/MIRALAX) 17 GM/SCOOP powder Take 17 g by mouth 2 (two) times daily as needed. 765 g 1   Potassium (POTASSIMIN PO) Take by mouth daily at 12 noon.     baclofen (LIORESAL) 20 MG tablet Take 1 tablet (20 mg total) by mouth 3 (three) times daily. 270 tablet 4   tiZANidine (ZANAFLEX) 4 MG tablet Take 1 tablet (4 mg total) by mouth 3 (three) times daily. 270 tablet 4   topiramate (TOPAMAX) 50 MG tablet Take 1 tablet (50 mg total) by mouth 2 (two) times daily. 60 tablet 12   Facility-Administered Medications Prior to Visit  Medication Dose Route Frequency Provider Last Rate Last Admin   diphenhydrAMINE (BENADRYL) injection 50 mg  50 mg Intravenous Once Maxtyn Nuzum R, MD       famotidine (PEPCID) 20 mg in sodium chloride 0.9 % 50 mL IVPB  20 mg Intravenous Once Waver Dibiasio R, MD       methylPREDNISolone sodium succinate (SOLU-MEDROL) 125 mg in sodium chloride 0.9 % 50 mL IVPB  125 mg Intravenous Once Arles Rumbold R, MD       ocrelizumab (OCREVUS) 600 mg in sodium chloride 0.9 % 250 mL  600 mg Intravenous Once Khalib Fendley, Glenford Bayley, MD        PAST MEDICAL HISTORY: Past Medical History:  Diagnosis Date   ADD (attention deficit disorder)    Anxiety and depression    Arthritis    Bradycardia    Constipation    Edema of both lower extremities    Encounter to establish care 01/22/2021   Family history of adverse reaction to anesthesia     nausea   Food allergy    Gallbladder problem    GERD (gastroesophageal reflux disease)    Headache    Hypertension    Multiple sclerosis (HCC)    Obesity    Osteoarthritis    Screening mammogram, encounter for 01/22/2021   Seizures (HCC)    told that they are not from the brain but stress related   Vitamin D deficiency     PAST SURGICAL HISTORY: Past Surgical History:  Procedure Laterality Date   BREAST REDUCTION SURGERY Bilateral 06/23/2021   Procedure: MAMMARY REDUCTION  (BREAST);  Surgeon: Allena Napoleon, MD;  Location: [facility] OR;  Service: Plastics;  Laterality: Bilateral;   CHOLECYSTECTOMY     KNEE SURGERY Bilateral    Arthroscopy    FAMILY HISTORY: Family History  Problem Relation Age of Onset   Hypertension Mother    Atrial fibrillation Mother    Thyroid disease Mother    Cancer Mother    Obesity Mother    Colon polyps Mother    Hypertension Father    Atrial fibrillation Maternal Grandmother     SOCIAL HISTORY:  Social History   Socioeconomic History   Marital status: Single    Spouse name: Not on file   Number of children: 1   Years of education: College   Highest education level: Not on file  Occupational History    Employer: OTHER    Comment: disabled  Tobacco Use   Smoking status: Former    Current packs/day: 0.00    Average packs/day: 0.4 packs/day for 3.0 years (1.1 ttl pk-yrs)    Types: Cigarettes    Start date: 12/08/2003    Quit date: 12/07/2006    Years since quitting: 16.8   Smokeless tobacco: Never  Vaping Use   Vaping status: Never Used  Substance and Sexual Activity   Alcohol use: No    Alcohol/week: 0.0 standard drinks of alcohol    Comment: quit: 2013 (socially)   Drug use: Not Currently    Types: Marijuana    Comment: Smokes approx 1-2 per day   Sexual activity: Not Currently  Other Topics Concern   Not on file  Social History Narrative   ** Merged History Encounter **       Patient lives at home with family. Caffeine  Use: drinks a cup of hot tea with tecfidera in the am Patient is right handed. Patient has a college education   Social Determinants of Health   Financial Resource Strain: Patient Declined (05/05/2023)   Overall Financial Resource Strain (CARDIA)    Difficulty of Paying Living Expenses: Patient declined  Food Insecurity: No Food Insecurity (05/05/2023)   Hunger Vital Sign    Worried About Running Out of Food in the Last Year: Never true    Ran Out of Food in the Last Year: Never true  Transportation Needs: Unmet Transportation Needs (05/05/2023)   PRAPARE - Administrator, Civil Service (Medical): Yes    Lack of Transportation (Non-Medical): Yes  Physical Activity: Insufficiently Active (05/05/2023)   Exercise Vital Sign    Days of Exercise per Week: 7 days    Minutes of Exercise per Session: 20 min  Stress: No Stress Concern Present (05/05/2023)   [employer] of Occupational Health - Occupational Stress Questionnaire    Feeling of Stress : Not at all  Social Connections: Socially Integrated (05/05/2023)   Social Connection and Isolation Panel [NHANES]    Frequency of Communication with Friends and Family: More than three times a week    Frequency of Social Gatherings with Friends and Family: More than three times a week    Attends Religious Services: More than 4 times per year    Active Member of [employer] or Organizations: Yes    Attends Engineer, structural: More than 4 times per year    Marital Status: Married  Catering manager Violence: Not At Risk (05/05/2023)   Humiliation, Afraid, Rape, and Kick questionnaire    Fear of Current or Ex-Partner: No    Emotionally Abused: No    Physically Abused: No    Sexually Abused: No     PHYSICAL EXAM  Vitals:   10/18/23 1314  BP: 138/85  Pulse: 68   No results found.   There is no height or  weight on file to calculate BMI.  GENERAL EXAM: Patient is in no distress; well developed, nourished and groomed;  neck is supple  CARDIOVASCULAR: Regular rate and rhythm, no murmurs, no carotid bruits  NEUROLOGIC: MENTAL STATUS: awake, alert, language fluent, comprehension intact, naming intact, fund of knowledge appropriate; SLOW SCANNING SPEECH PATTERN CRANIAL NERVE: PUPILS PINPOINT AND REACTIVE; visual fields full to confrontation, extraocular muscles --> SACCADIC DYSMETRIA; NO PTOSIS; facial sensation symmetric, FACE --> DEC LEFT NL FOLD, hearing intact, palate elevates symmetrically, uvula midline, shoulder shrug symmetric, tongue midline. MOTOR: INCREASED TONE IN RUE and RLE; BUE 3 PROX AND 2-3 DISTAL; RIGHT HAND WEAKER THAN LEFT; BLE 2 (right weaker than left); RIGHT FOOT DF 1+; LEFT HF BRIEFLY 3/5 SENSORY: DECR IN RIGHT HAND AND RIGHT FOOT TO ALL MODALITIES COORDINATION: BUE DYSMETRIA; SLOW FINGER AND FOOT TAP ON RIGHT REFLEXES: RUE 2+, LUE 1; BLE TRACE GAIT/STATION: IN POWER WHEELCHAIR    DIAGNOSTIC DATA (LABS, IMAGING, TESTING) - I reviewed patient records, labs, notes, testing and imaging myself where available.  Lab Results  Component Value Date   WBC 6.3 10/27/2022   HGB 14.8 10/27/2022   HCT 45.3 10/27/2022   MCV 95 10/27/2022   PLT 279 10/27/2022      Component Value Date/Time   NA 141 03/25/2023 1434   K 4.4 03/25/2023 1434   CL 102 03/25/2023 1434   CO2 20 03/25/2023 1434   GLUCOSE 90 03/25/2023 1434   GLUCOSE 85 06/23/2021 1136   BUN 12 03/25/2023 1434   CREATININE 0.86 03/25/2023 1434   CALCIUM 9.5 03/25/2023 1434   PROT 6.7 03/25/2023 1434   ALBUMIN 4.1 03/25/2023 1434   AST 21 03/25/2023 1434   ALT 38 (H) 03/25/2023 1434   ALKPHOS 97 03/25/2023 1434   BILITOT 0.2 03/25/2023 1434   GFRNONAA 76 12/10/2020 1251   GFRAA 87 12/10/2020 1251   Lab Results  Component Value Date   CHOL 149 02/23/2023   HDL 50 02/23/2023   LDLCALC 85 02/23/2023   TRIG 72 02/23/2023   Lab Results  Component Value Date   HGBA1C 5.3 08/19/2022   Lab Results  Component Value Date    VITAMINB12 525 02/23/2023   Lab Results  Component Value Date   TSH 1.690 02/23/2023    Vit D, 25-Hydroxy  Date Value Ref Range Status  02/23/2023 55.6 30.0 - 100.0 ng/mL Final    Comment:    Vitamin D deficiency has been defined by the Institute of Medicine and an Endocrine Society practice guideline as a level of serum 25-OH vitamin D less than 20 ng/mL (1,2). The Endocrine Society went on to further define vitamin D insufficiency as a level between 21 and 29 ng/mL (2). 1. IOM (Institute of Medicine). 2010. Dietary reference    intakes for calcium and D. [place]: The    [employer]. 2. Holick MF, Binkley [place], Bischoff-Ferrari HA, et al.    Evaluation, treatment, and prevention of vitamin D    deficiency: an Endocrine Society clinical practice    guideline. JCEM. 2011 Jul; 96(7):1911-30.   09/23/2021 38.1 30.0 - 100.0 ng/mL Final    Comment:    Vitamin D deficiency has been defined by the Institute of Medicine and an Endocrine Society practice guideline as a level of serum 25-OH vitamin D less than 20 ng/mL (1,2). The Endocrine Society went on to further define vitamin D insufficiency as a level between 21 and 29 ng/mL (2). 1. IOM (Institute of Medicine). 2010. Dietary  reference    intakes for calcium and D. [place]: The    [employer]. 2. Holick MF, Binkley [place], Bischoff-Ferrari HA, et al.    Evaluation, treatment, and prevention of vitamin D    deficiency: an Endocrine Society clinical practice    guideline. JCEM. 2011 Jul; 96(7):1911-30.   12/22/2016 34.1 30.0 - 100.0 ng/mL Final    Comment:    Vitamin D deficiency has been defined by the Institute of Medicine and an Endocrine Society practice guideline as a level of serum 25-OH vitamin D less than 20 ng/mL (1,2). The Endocrine Society went on to further define vitamin D insufficiency as a level between 21 and 29 ng/mL (2). 1. IOM (Institute of Medicine). 2010. Dietary  reference    intakes for calcium and D. [place]: The    [employer]. 2. Holick MF, Binkley [place], Bischoff-Ferrari HA, et al.    Evaluation, treatment, and prevention of vitamin D    deficiency: an Endocrine Society clinical practice    guideline. JCEM. 2011 Jul; 96(7):1911-30.    Lymphocytes Absolute  Date Value Ref Range Status  05/26/2022 1.6 0.7 - 3.1 x10E3/uL Final  09/23/2021 0.9 0.7 - 3.1 x10E3/uL Final  12/10/2020 1.5 0.7 - 3.1 x10E3/uL Final    11/19/18 MRI of the brain with and without contrast shows the following: 1.    T2/flair hyperintense foci in the brainstem, cerebellum, left thalamus and hemispheres in a pattern and configuration consistent with chronic demyelinating plaque associated with multiple sclerosis.  None of the foci appear to be acute and there were no new lesions compared to the 2018 MRI. 2.    There is a normal enhancement pattern and there are no acute findings.  11/19/18 MRI of the cervical spine with and without contrast shows the following: 1.     There are about 8 T2 hyperintense foci within the spinal cord consistent with chronic demyelinating plaque associated with multiple sclerosis.  None of the foci appears to be acute.  They were all present on the MRI dated 11/05/2015. 2.     There are no significant cervical spine degenerative changes. 3.     There is a normal enhancement pattern and no acute findings.  05/23/20 MRI brain MRI brain without contrast demonstrating: -Mild stable periventricular, pericallosal and subcortical foci of chronic demyelinating plaques. -No acute finding.  10/08/21 MRI brain (with and without) demonstrating: - Few small, stable chronic demyelinating plaques.  - No acute plaques.    Labs - ANA, ANCA, ACE, HIV, RPR - all negative  11/01/14 anti-JCV ab - 1.31 (H) positive  07/27/18 RLE u/s - No evidence of DVT and no evidence of venous insufficiency.    ASSESSMENT AND PLAN  47 y.o. year old  female here with multiple focal neurologic attacks since 2008. Neurologic exam demonstrates long tract signs affecting the right arm and right leg more than left side. 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 Other labs negative.   On tecfidera since Feb 2016. Had flare up symptoms in Nov 2016 and Nov-Dec 2017. Also new plaque in Jan 2018. Now on ocrevus since May/June 2018.   Muscle spasms and leg pain (on baclofen, tizanidine and gabapentin).  Fatigue improved with medication regimen adjustment.   Bladder incontinence --> using adult pads and oxybutinin   Depression stable (seeing Dr. Lilian Kapur, [facility]).    Dx:  Multiple sclerosis (HCC) - Plan: CBC with Differential/Platelet, Immunoglobulins, QN, A/E/G/M, MR BRAIN WO  CONTRAST     PLAN:  Multiple sclerosis disease modifying therapy (established problem, progressive; since ~2008; on tecfidera in 2016; on ocrevus since 2018) - continue ocrevus (Jan / July) - check CBC, immunoglobulin panel every 6 months - continue multivitamin and vitamin D supplements  Muscle spasms (established problem, stable) - continue baclofen 20mg  three times per day for muscle spasms - continue tizanidine 4mg  three times per day for muscle spasms  MEMORY LOSS - optimize nutrition and exercise - follow up with psychiatry (optimize depression / anxiety treatments; consider caffeine vs stimulant meds) - safety / supervision issues reviewed - no driving, caution with meds, finances  Bilateral leg pain / neuropathy (established problem, stable) - tried and failed gabapentin - continue topiramate 50mg  twice a day   Bladder incontinence (established problem, improved) - consider follow up with urology (tried and failed oxybutynin)  Depression / anxiety (established problem, stable) - per Dr. Lilian Kapur [facility]) - continue abilify, duloxetine  Orders Placed This Encounter  Procedures   MR BRAIN WO  CONTRAST   CBC with Differential/Platelet   Immunoglobulins, QN, A/E/G/M   Meds ordered this encounter  Medications   baclofen (LIORESAL) 20 MG tablet    Sig: Take 1 tablet (20 mg total) by mouth 3 (three) times daily.    Dispense:  270 tablet    Refill:  4   tiZANidine (ZANAFLEX) 4 MG tablet    Sig: Take 1 tablet (4 mg total) by mouth 3 (three) times daily.    Dispense:  270 tablet    Refill:  4   topiramate (TOPAMAX) 50 MG tablet    Sig: Take 1 tablet (50 mg total) by mouth 2 (two) times daily.    Dispense:  180 tablet    Refill:  4   Return in about 8 months (around 06/16/2024).    Suanne Marker, MD 10/18/2023, 1:52 PM Certified in Neurology, Neurophysiology and Neuroimaging  [facility] [address], [address] [place], [place] [address] [phone]

## 2023-10-20 ENCOUNTER — Telehealth: Payer: Self-pay | Admitting: Diagnostic Neuroimaging

## 2023-10-20 NOTE — Telephone Encounter (Signed)
sent to GI they obtain [place], [place] medicaid [place] [phone]

## 2023-10-22 LAB — CBC WITH DIFFERENTIAL/PLATELET
Basophils Absolute: 0 10*3/uL (ref 0.0–0.2)
Basos: 1 %
EOS (ABSOLUTE): 0.1 10*3/uL (ref 0.0–0.4)
Eos: 2 %
Hematocrit: 43.5 % (ref 34.0–46.6)
Hemoglobin: 14 g/dL (ref 11.1–15.9)
Immature Grans (Abs): 0 10*3/uL (ref 0.0–0.1)
Immature Granulocytes: 0 %
Lymphocytes Absolute: 2.2 10*3/uL (ref 0.7–3.1)
Lymphs: 29 %
MCH: 30.5 pg (ref 26.6–33.0)
MCHC: 32.2 g/dL (ref 31.5–35.7)
MCV: 95 fL (ref 79–97)
Monocytes Absolute: 0.5 10*3/uL (ref 0.1–0.9)
Monocytes: 6 %
Neutrophils Absolute: 4.7 10*3/uL (ref 1.4–7.0)
Neutrophils: 62 %
Platelets: 111 10*3/uL — ABNORMAL LOW (ref 150–450)
RBC: 4.59 x10E6/uL (ref 3.77–5.28)
RDW: 12.6 % (ref 11.7–15.4)
WBC: 7.6 10*3/uL (ref 3.4–10.8)

## 2023-10-22 LAB — IMMUNOGLOBULINS A/E/G/M, SERUM
IgA/Immunoglobulin A, Serum: 385 mg/dL — ABNORMAL HIGH (ref 87–352)
IgE (Immunoglobulin E), Serum: 12 [IU]/mL (ref 6–495)
IgG (Immunoglobin G), Serum: 1382 mg/dL (ref 586–1602)
IgM (Immunoglobulin M), Srm: 65 mg/dL (ref 26–217)

## 2023-10-26 NOTE — Telephone Encounter (Signed)
She needs to go to the hospital because she needs help getting on the table.

## 2023-10-27 NOTE — Telephone Encounter (Signed)
[employer] medicare [place]kley Harvey: [id number] exp. 10/27/23-04/24/24, [place] medicaid NPR sent to [facility] [phone]

## 2023-10-28 DIAGNOSIS — F330 Major depressive disorder, recurrent, mild: Secondary | ICD-10-CM | POA: Diagnosis not present

## 2023-10-28 DIAGNOSIS — F411 Generalized anxiety disorder: Secondary | ICD-10-CM | POA: Diagnosis not present

## 2023-11-09 ENCOUNTER — Ambulatory Visit (HOSPITAL_COMMUNITY)
Admission: RE | Admit: 2023-11-09 | Discharge: 2023-11-09 | Disposition: A | Discharge status: Home / Self Care | Payer: Medicare HMO | Source: Ambulatory Visit | Attending: Diagnostic Neuroimaging | Admitting: Diagnostic Neuroimaging

## 2023-11-09 ENCOUNTER — Other Ambulatory Visit: Payer: Medicare HMO

## 2023-11-09 DIAGNOSIS — G35 Multiple sclerosis: Secondary | ICD-10-CM | POA: Insufficient documentation

## 2023-11-16 ENCOUNTER — Encounter (INDEPENDENT_AMBULATORY_CARE_PROVIDER_SITE_OTHER): Payer: Self-pay | Admitting: Internal Medicine

## 2023-11-16 ENCOUNTER — Ambulatory Visit (INDEPENDENT_AMBULATORY_CARE_PROVIDER_SITE_OTHER): Payer: Medicare HMO | Admitting: Internal Medicine

## 2023-11-16 VITALS — BP 111/78 | HR 74 | Temp 98.9°F | Ht 68.0 in | Wt 255.0 lb

## 2023-11-16 DIAGNOSIS — E66813 Obesity, class 3: Secondary | ICD-10-CM

## 2023-11-16 DIAGNOSIS — E88819 Insulin resistance, unspecified: Secondary | ICD-10-CM | POA: Diagnosis not present

## 2023-11-16 DIAGNOSIS — R600 Localized edema: Secondary | ICD-10-CM | POA: Diagnosis not present

## 2023-11-16 DIAGNOSIS — I10 Essential (primary) hypertension: Secondary | ICD-10-CM

## 2023-11-16 DIAGNOSIS — Z6841 Body Mass Index (BMI) 40.0 and over, adult: Secondary | ICD-10-CM

## 2023-11-16 DIAGNOSIS — G35 Multiple sclerosis: Secondary | ICD-10-CM | POA: Diagnosis not present

## 2023-11-16 NOTE — Progress Notes (Signed)
Office: [phone]  /  Fax: [phone]  Weight Summary And Biometrics  Vitals Temp: 98.9 F (37.2 C) BP: 111/78 Pulse Rate: 74 SpO2: 100 %   Anthropometric Measurements Height: 5\' 8"  (1.727 m) Weight: 255 lb (115.7 kg) BMI (Calculated): 38.78 Weight at Last Visit: 258 lb Weight Lost Since Last Visit: 3 lb Starting Weight: 309 lb Total Weight Loss (lbs): 54 lb (24.5 kg)   No data recorded  No data recorded Today's Visit #: 11  Starting Date: 02/22/23   Subjective   Chief Complaint: Obesity  Ebony Warren is here to discuss her progress with her obesity treatment plan. She is on the the Category 2 Plan and states she is following her eating plan approximately 35 % of the time. She states she is exercising 30 minutes 3 times per week.  Interval History:   Discussed the use of AI scribe software for clinical note transcription with the patient, who gave verbal consent to proceed.  History of Present Illness   The patient, a 47 year old individual with a history of multiple sclerosis, hemiparesis of the right side, and insulin resistance, presents today for a medical weight management consultation. She has been on metformin XR 500mg  twice daily for insulin resistance and weight management, and topiramate for neurological symptoms related to her MS.  The patient reports a recent weight loss of three pounds, bringing her total weight loss to 54 pounds since March of this year.  She has been adhering to a nutrition plan, consuming three meals a day with a focus on protein intake. She has not reported any changes in her medication regimen.  Despite her hemiplegia, the patient has been attending the gym and performing exercises with one, three, and five-pound weights to strengthen her right side. She reports no further weakness, but the strength remains the same. She also reports retaining some water, particularly in her legs, despite being on a diuretic. However, she only  takes the diuretic once a week due to issues with incontinence.  The patient's BMI has decreased from 47 to 38. She has not set a specific goal weight, instead focusing on consistency and continued improvement. She reports that the switch from gabapentin to topiramate has helped reduce swelling and improve her ability to perform daily tasks such as putting on shoes. She also reports that Abilify has been helpful.     Orexigenic Control:  Denies problems with appetite and hunger signals.  Denies problems with satiety and satiation.  Denies problems with eating patterns and portion control.  Denies abnormal cravings. Denies feeling deprived or restricted.   Barriers identified: none.   Pharmacotherapy for weight loss: She is currently taking Metformin (off label use for incretin effect and / or insulin resistance and / or diabetes prevention) with adequate clinical response  and without side effects. and topiramate being prescribed by neurology for MS .   Assessment and Plan   Treatment Plan For Obesity:  Recommended Dietary Goals  Ebony Warren is currently in the action stage of change. As such, her goal is to continue weight management plan. She has agreed to: continue current plan  Behavioral Intervention  We discussed the following Behavioral Modification Strategies today: continue to work on maintaining a reduced calorie state, getting the recommended amount of protein, incorporating whole foods, making healthy choices, staying well hydrated and practicing mindfulness when eating..  Additional resources provided today: None  Recommended Physical Activity Goals  Ebony Warren has been advised to work up to 150 minutes of moderate intensity  aerobic activity a week and strengthening exercises 2-3 times per week for cardiovascular health, weight loss maintenance and preservation of muscle mass.   She has agreed to :  continue to gradually increase the amount and intensity of exercise    Pharmacotherapy  We discussed various medication options to help Ebony Warren with her weight loss efforts and we both agreed to : continue current anti-obesity medication regimen  Associated Conditions Addressed Today  Leg edema  Essential hypertension  Insulin resistance -     Hemoglobin A1c -     Insulin, random -     Vitamin B12  Multiple sclerosis (HCC) -     [id number]+EGFR  Class 3 severe obesity with serious comorbidity and body mass index (BMI) of 45.0 to 49.9 in adult, unspecified obesity type (HCC)    Assessment and Plan    Obesity Ebony Warren presents for medical weight management with a history of multiple sclerosis, right-sided hemiplegia, insulin resistance, and essential hypertension. She has achieved a weight loss of 54 pounds, lowering her BMI from 47 to 38, including a recent 3-pound loss over the holidays. The transition from gabapentin to topiramate has positively impacted her MS symptoms and weight management due to its appetite suppressant effects. Ebony Warren prefers a day-by-day approach to weight loss without setting a specific goal weight. We will continue metformin XR 500 mg once daily and topiramate as prescribed by neurology, monitor her weight and dietary intake, and schedule a follow-up in early January.  Insulin Resistance Ebony Warren is managing her insulin resistance with metformin XR 500 mg daily, showing compliance by reducing sugar, high fructose corn syrup, and processed carbs, which has favorably affected her insulin levels. We discussed the importance of monitoring B12 levels due to metformin use. We will continue metformin, check insulin and B12 levels today, repeat kidney function and liver enzymes, and maintain her current regimen.  Multiple Sclerosis with Right-Sided Hemiplegia Ebony Warren reports stable strength on her right side, continuing exercise with 3-5 pound weights. Water retention in her legs has improved with the switch from gabapentin to  topiramate, and she uses a diuretic weekly to manage fluid retention, carefully balancing it with incontinence. We will continue topiramate as prescribed, monitor for changes in strength and swelling, and consider diuretic use as needed.  Essential Hypertension Ebony Warren's blood pressure remains well-controlled under her current management regimen.  General Health Maintenance Ebony Warren's adherence to her nutrition plan, focusing on adequate protein intake, contributes to her overall health maintenance. We will monitor her dietary intake, ensuring adequate protein consumption, perform blood work today including CMP, B12, A1c, and insulin levels, and schedule a follow-up in early January.  Follow-up We will schedule a follow-up in early January to review lab results and communicate via the patient portal.        Objective   Physical Exam:  Blood pressure 111/78, pulse 74, temperature 98.9 F (37.2 C), height 5\' 8"  (1.727 m), weight 255 lb (115.7 kg), SpO2 100%. Body mass index is 38.77 kg/m.  General: She is overweight, cooperative, alert, well developed, and in no acute distress. PSYCH: Has normal mood, affect and thought process.   HEENT: EOMI, sclerae are anicteric. Lungs: Normal breathing effort, no conversational dyspnea. Extremities: No edema.  Neurologic: No gross sensory or motor deficits. No tremors or fasciculations noted.    Diagnostic Data Reviewed:  BMET    Component Value Date/Time   NA 141 03/25/2023 1434   K 4.4 03/25/2023 1434   CL 102 03/25/2023 1434   CO2 20  03/25/2023 1434   GLUCOSE 90 03/25/2023 1434   GLUCOSE 85 06/23/2021 1136   BUN 12 03/25/2023 1434   CREATININE 0.86 03/25/2023 1434   CALCIUM 9.5 03/25/2023 1434   GFRNONAA 76 12/10/2020 1251   GFRAA 87 12/10/2020 1251   Lab Results  Component Value Date   HGBA1C 5.3 08/19/2022   Lab Results  Component Value Date   INSULIN 25.0 (H) 02/23/2023   Lab Results  Component Value Date   TSH  1.690 02/23/2023   CBC    Component Value Date/Time   WBC 7.6 10/18/2023 1434   WBC 7.3 08/30/2014 1031   RBC 4.59 10/18/2023 1434   RBC 4.07 08/30/2014 1031   HGB 14.0 10/18/2023 1434   HCT 43.5 10/18/2023 1434   PLT 111 (L) 10/18/2023 1434   MCV 95 10/18/2023 1434   MCH 30.5 10/18/2023 1434   MCH 31.4 08/30/2014 1031   MCHC 32.2 10/18/2023 1434   MCHC 33.9 08/30/2014 1031   RDW 12.6 10/18/2023 1434   Iron Studies    Component Value Date/Time   IRON 68 10/27/2022 1056   TIBC 313 10/27/2022 1056   FERRITIN 41 10/27/2022 1056   IRONPCTSAT 22 10/27/2022 1056   Lipid Panel     Component Value Date/Time   CHOL 149 02/23/2023 1305   TRIG 72 02/23/2023 1305   HDL 50 02/23/2023 1305   LDLCALC 85 02/23/2023 1305   Hepatic Function Panel     Component Value Date/Time   PROT 6.7 03/25/2023 1434   ALBUMIN 4.1 03/25/2023 1434   AST 21 03/25/2023 1434   ALT 38 (H) 03/25/2023 1434   ALKPHOS 97 03/25/2023 1434   BILITOT 0.2 03/25/2023 1434      Component Value Date/Time   TSH 1.690 02/23/2023 1305   Nutritional Lab Results  Component Value Date   VD25OH 55.6 02/23/2023   VD25OH 38.1 09/23/2021   VD25OH 34.1 12/22/2016    Follow-Up   Return in about 4 weeks (around 12/14/2023) for For Weight Mangement with Dr. Rikki Spearing.Marland Kitchen She was informed of the importance of frequent follow up visits to maximize her success with intensive lifestyle modifications for her multiple health conditions.  Attestation Statement   Reviewed by clinician on day of visit: allergies, medications, problem list, medical history, surgical history, family history, social history, and previous encounter notes.     Worthy Rancher, MD

## 2023-11-17 DIAGNOSIS — G35 Multiple sclerosis: Secondary | ICD-10-CM | POA: Diagnosis not present

## 2023-11-17 DIAGNOSIS — E88819 Insulin resistance, unspecified: Secondary | ICD-10-CM | POA: Diagnosis not present

## 2023-11-17 NOTE — Addendum Note (Signed)
Addended by: Paulla Fore A on: 11/17/2023 09:24 AM   Modules accepted: Orders

## 2023-11-19 LAB — CMP14+EGFR
ALT: 8 [IU]/L (ref 0–32)
AST: 9 [IU]/L (ref 0–40)
Albumin: 4.2 g/dL (ref 3.9–4.9)
Alkaline Phosphatase: 80 [IU]/L (ref 44–121)
BUN/Creatinine Ratio: 15 (ref 9–23)
BUN: 12 mg/dL (ref 6–24)
Bilirubin Total: 0.5 mg/dL (ref 0.0–1.2)
CO2: 19 mmol/L — ABNORMAL LOW (ref 20–29)
Calcium: 9.2 mg/dL (ref 8.7–10.2)
Chloride: 108 mmol/L — ABNORMAL HIGH (ref 96–106)
Creatinine, Ser: 0.81 mg/dL (ref 0.57–1.00)
Globulin, Total: 2.5 g/dL (ref 1.5–4.5)
Glucose: 76 mg/dL (ref 70–99)
Potassium: 3.9 mmol/L (ref 3.5–5.2)
Sodium: 142 mmol/L (ref 134–144)
Total Protein: 6.7 g/dL (ref 6.0–8.5)
eGFR: 91 mL/min/{1.73_m2} (ref >59)

## 2023-11-19 LAB — INSULIN, RANDOM: INSULIN: 4.8 u[IU]/mL (ref 2.6–24.9)

## 2023-11-19 LAB — VITAMIN B12: Vitamin B-12: 397 pg/mL (ref 232–1245)

## 2023-11-27 LAB — HEMOGLOBIN A1C
Est. average glucose Bld gHb Est-mCnc: 103 mg/dL
Hgb A1c MFr Bld: 5.2 % (ref 4.8–5.6)

## 2023-12-07 ENCOUNTER — Other Ambulatory Visit: Payer: Self-pay | Admitting: Neurology

## 2023-12-07 DIAGNOSIS — G35 Multiple sclerosis: Secondary | ICD-10-CM

## 2023-12-07 DIAGNOSIS — E559 Vitamin D deficiency, unspecified: Secondary | ICD-10-CM

## 2023-12-07 DIAGNOSIS — R29898 Other symptoms and signs involving the musculoskeletal system: Secondary | ICD-10-CM

## 2023-12-09 ENCOUNTER — Other Ambulatory Visit: Payer: Self-pay | Admitting: Medical Genetics

## 2023-12-13 ENCOUNTER — Encounter (HOSPITAL_COMMUNITY): Payer: Medicare HMO

## 2023-12-15 ENCOUNTER — Non-Acute Institutional Stay (HOSPITAL_COMMUNITY)
Admission: RE | Admit: 2023-12-15 | Discharge: 2023-12-15 | Disposition: A | Discharge status: Home / Self Care | Payer: Medicare HMO | Source: Ambulatory Visit | Attending: Internal Medicine | Admitting: Internal Medicine

## 2023-12-15 DIAGNOSIS — G35 Multiple sclerosis: Secondary | ICD-10-CM | POA: Insufficient documentation

## 2023-12-15 DIAGNOSIS — E559 Vitamin D deficiency, unspecified: Secondary | ICD-10-CM | POA: Diagnosis not present

## 2023-12-15 DIAGNOSIS — R29898 Other symptoms and signs involving the musculoskeletal system: Secondary | ICD-10-CM | POA: Diagnosis not present

## 2023-12-15 MED ORDER — ACETAMINOPHEN 325 MG PO TABS
650.0000 mg | ORAL_TABLET | Freq: Once | ORAL | Status: AC
Start: 2023-12-15 — End: 2023-12-15
  Administered 2023-12-15: 650 mg via ORAL
  Filled 2023-12-15: qty 2

## 2023-12-15 MED ORDER — METHYLPREDNISOLONE SODIUM SUCC 125 MG IJ SOLR
125.0000 mg | Freq: Once | INTRAMUSCULAR | Status: AC
  Administered 2023-12-15: 125 mg via INTRAVENOUS
  Filled 2023-12-15: qty 2

## 2023-12-15 MED ORDER — DIPHENHYDRAMINE HCL 50 MG/ML IJ SOLN
50.0000 mg | Freq: Once | INTRAMUSCULAR | Status: AC
Start: 2023-12-15 — End: 2023-12-15
  Administered 2023-12-15: 50 mg via INTRAVENOUS
  Filled 2023-12-15: qty 1

## 2023-12-15 MED ORDER — SODIUM CHLORIDE 0.9 % IV SOLN: INTRAVENOUS | Status: DC | PRN

## 2023-12-15 MED ORDER — FAMOTIDINE IN NACL 20-0.9 MG/50ML-% IV SOLN
20.0000 mg | Freq: Once | INTRAVENOUS | Status: AC
  Administered 2023-12-15: 20 mg via INTRAVENOUS
  Filled 2023-12-15: qty 50

## 2023-12-15 MED ORDER — SODIUM CHLORIDE 0.9 % IV SOLN
600.0000 mg | Freq: Once | INTRAVENOUS | Status: AC
  Administered 2023-12-15: 600 mg via INTRAVENOUS
  Filled 2023-12-15: qty 20

## 2023-12-15 NOTE — Progress Notes (Signed)
 PATIENT CARE CENTER NOTE  Diagnosis: Multiple Sclerosis (HCC) [G35]  Provider: Margaret Eduard SAUNDERS, MD   Procedure: Ocrevus  600 mg  Note: Patient received Ocrevus  600 mg infusion via PIV (dose #1 of 1). Pt pre-medicated per orders with PO Tylenol , IV Benadryl , IV Solumedrol, and IVPB Pepcid . Pt tolerated infusion with no adverse reaction. Infusion titrated per protocol. Vital signs stable. AVS printed and given to patient. Pt advised that she should return to clinic in 6 months for her next infusion, and can schedule appointment at the front desk. Pt is alert and oriented, and discharged home in personal motorized chair.

## 2023-12-20 ENCOUNTER — Other Ambulatory Visit: Payer: Self-pay | Admitting: Diagnostic Neuroimaging

## 2023-12-20 DIAGNOSIS — D696 Thrombocytopenia, unspecified: Secondary | ICD-10-CM

## 2023-12-22 ENCOUNTER — Encounter (INDEPENDENT_AMBULATORY_CARE_PROVIDER_SITE_OTHER): Payer: Self-pay | Admitting: Internal Medicine

## 2023-12-22 ENCOUNTER — Ambulatory Visit (INDEPENDENT_AMBULATORY_CARE_PROVIDER_SITE_OTHER): Payer: Medicare HMO | Admitting: Internal Medicine

## 2023-12-22 VITALS — BP 134/80 | HR 69 | Temp 98.5°F | Ht 65.0 in | Wt 248.0 lb

## 2023-12-22 DIAGNOSIS — R600 Localized edema: Secondary | ICD-10-CM

## 2023-12-22 DIAGNOSIS — E66813 Obesity, class 3: Secondary | ICD-10-CM

## 2023-12-22 DIAGNOSIS — E88819 Insulin resistance, unspecified: Secondary | ICD-10-CM

## 2023-12-22 DIAGNOSIS — Z6841 Body Mass Index (BMI) 40.0 and over, adult: Secondary | ICD-10-CM

## 2023-12-22 DIAGNOSIS — G35 Multiple sclerosis: Secondary | ICD-10-CM | POA: Diagnosis not present

## 2023-12-22 MED ORDER — METFORMIN HCL ER 500 MG PO TB24: 500.0000 mg | ORAL_TABLET | Freq: Two times a day (BID) | ORAL | 0 refills | Status: DC

## 2023-12-22 NOTE — Assessment & Plan Note (Signed)
 Stable.  On Ocrevus .  Patient is aware that she is an increased risk for infection.  She has been maintaining adequate immunization schedule.  She continues on topiramate  and is no longer on gabapentin .  This has helped with her weight loss as well as lower extremity edema.  She will follow-up with neurology in a few months.

## 2023-12-22 NOTE — Assessment & Plan Note (Signed)
 Significant weight loss of 61 pounds (18% of total body weight) with medically supervised weight management plan inclusive of metformin  and topiramate . Current regimen includes dietary control, exercise, and medication. Compliant with three meals a day, adequate protein intake, and hydration. Engages in wheelchair workouts for 45 minutes to an hour. Insulin  levels reduced from 25 to 4.8, indicating resolution of insulin  resistance. - Continue topiramate  50 mg twice daily - Continue metformin  - Encourage progression of physical activity by incorporating light weights or increasing repetitions/duration - Follow up in one month to monitor weight and progress

## 2023-12-22 NOTE — Assessment & Plan Note (Signed)
 She has not pitting edema to mid tibia which is improved.  Unfortunately she has difficulty elevating her legs due to musculoskeletal weakness.

## 2023-12-22 NOTE — Progress Notes (Signed)
 Office: [phone]  /  Fax: [phone]  Weight Summary And Biometrics  Vitals Temp: 98.5 F (36.9 C) BP: 134/80 Pulse Rate: 69 SpO2: 100 %   Anthropometric Measurements Height: 5\' 5"  (1.651 m) Weight: 248 lb (112.5 kg) BMI (Calculated): 41.27 Weight at Last Visit: 255 lb Weight Lost Since Last Visit: 7 lb Weight Gained Since Last Visit: 0 lb Starting Weight: 309 lb Total Weight Loss (lbs): 61 lb (27.7 kg)   No data recorded  No data recorded Today's Visit #: 12  Starting Date: 02/22/23   Subjective   Chief Complaint: Obesity  Ebony Warren is here to discuss her progress with her obesity treatment plan. She is on the the Category 2 Plan and states she is following her eating plan approximately 40 % of the time. She states she is exercising 60 minutes 4 times per week.  Interval History:   Since last office visit she has lost 7 pounds. She reports good adherence to reduced calorie nutritional plan. She has been working on reading food labels, not skipping meals, increasing protein intake at every meal, drinking more water, making healthier choices, continues to exercise, reducing portion sizes, and incorporating more whole foods   Sleeping approximately 10 hours a day.  Sleep described as restorative.   Stress levels are reported as low and manageable.   Orexigenic Control:  Denies problems with appetite and hunger signals.  Denies problems with satiety and satiation.  Denies problems with eating patterns and portion control.  Denies abnormal cravings. Denies feeling deprived or restricted.   Barriers identified: none.   Pharmacotherapy for weight loss: She is currently taking Metformin  (off label use for incretin effect and / or insulin  resistance and / or diabetes prevention) with adequate clinical response  and without side effects. and Topiramate  (off label use, single agent) with adequate clinical response  and without side effects..   Assessment  and Plan   Treatment Plan For Obesity:  Recommended Dietary Goals  Ebony Warren is currently in the action stage of change. As such, her goal is to continue weight management plan. She has agreed to: continue current plan  Behavioral Intervention  We discussed the following Behavioral Modification Strategies today: continue to work on maintaining a reduced calorie state, getting the recommended amount of protein, incorporating whole foods, making healthy choices, staying well hydrated and practicing mindfulness when eating..  Additional resources provided today: None  Recommended Physical Activity Goals  Ebony Warren has been advised to work up to 150 minutes of moderate intensity aerobic activity a week and strengthening exercises 2-3 times per week for cardiovascular health, weight loss maintenance and preservation of muscle mass.   She has agreed to :  Think about enjoyable ways to increase daily physical activity and overcoming barriers to exercise and Increase physical activity in their day and reduce sedentary time (increase NEAT).  Pharmacotherapy  We discussed various medication options to help Ebony Warren with her weight loss efforts and we both agreed to : continue current anti-obesity medication regimen  Associated Conditions Addressed and Impacted by Obesity Treatment  Multiple sclerosis (HCC) Assessment & Plan: Stable.  On Ocrevus .  Patient is aware that she is an increased risk for infection.  She has been maintaining adequate immunization schedule.  She continues on topiramate  and is no longer on gabapentin .  This has helped with her weight loss as well as lower extremity edema.  She will follow-up with neurology in a few months.   Insulin  resistance Assessment & Plan: Her hemoglobin A1c  is 5.2 her insulin  levels are now at 4.8 which are optimal and reduced from 25.  This is a significant improvement in insulin  resistance.  She is currently on metformin  XR twice daily  without any adverse effect.  She will continue medication.  She also maintains a diet with a low glycemic load.  Orders: -     metFORMIN  HCl ER; Take 1 tablet (500 mg total) by mouth 2 (two) times daily with a meal.  Dispense: 180 tablet; Refill: 0  Class 3 severe obesity with serious comorbidity and body mass index (BMI) of 45.0 to 49.9 in adult, unspecified obesity type (HCC) Assessment & Plan: Significant weight loss of 61 pounds (18% of total body weight) with medically supervised weight management plan inclusive of metformin  and topiramate . Current regimen includes dietary control, exercise, and medication. Compliant with three meals a day, adequate protein intake, and hydration. Engages in wheelchair workouts for 45 minutes to an hour. Insulin  levels reduced from 25 to 4.8, indicating resolution of insulin  resistance. - Continue topiramate  50 mg twice daily - Continue metformin  - Encourage progression of physical activity by incorporating light weights or increasing repetitions/duration - Follow up in one month to monitor weight and progress   Leg edema Assessment & Plan: She has not pitting edema to mid tibia which is improved.  Unfortunately she has difficulty elevating her legs due to musculoskeletal weakness.          Objective   Physical Exam:  Blood pressure 134/80, pulse 69, temperature 98.5 F (36.9 C), height 5\' 5"  (1.651 m), weight 248 lb (112.5 kg), SpO2 100%. Body mass index is 41.27 kg/m.  General: She is overweight, cooperative, alert, well developed, and in no acute distress. PSYCH: Has normal mood, affect and thought process.   HEENT: EOMI, sclerae are anicteric. Lungs: Normal breathing effort, no conversational dyspnea. Extremities: No edema.  Neurologic: No gross sensory or motor deficits. No tremors or fasciculations noted.    Diagnostic Data Reviewed:  BMET    Component Value Date/Time   NA 142 11/17/2023 0917   K 3.9 11/17/2023 0917   CL 108  (H) 11/17/2023 0917   CO2 19 (L) 11/17/2023 0917   GLUCOSE 76 11/17/2023 0917   GLUCOSE 85 06/23/2021 1136   BUN 12 11/17/2023 0917   CREATININE 0.81 11/17/2023 0917   CALCIUM 9.2 11/17/2023 0917   GFRNONAA 76 12/10/2020 1251   GFRAA 87 12/10/2020 1251   Lab Results  Component Value Date   HGBA1C 5.2 11/26/2023   HGBA1C 5.3 08/19/2022   Lab Results  Component Value Date   INSULIN  4.8 11/17/2023   INSULIN  25.0 (H) 02/23/2023   Lab Results  Component Value Date   TSH 1.690 02/23/2023   CBC    Component Value Date/Time   WBC 7.6 10/18/2023 1434   WBC 7.3 08/30/2014 1031   RBC 4.59 10/18/2023 1434   RBC 4.07 08/30/2014 1031   HGB 14.0 10/18/2023 1434   HCT 43.5 10/18/2023 1434   PLT 111 (L) 10/18/2023 1434   MCV 95 10/18/2023 1434   MCH 30.5 10/18/2023 1434   MCH 31.4 08/30/2014 1031   MCHC 32.2 10/18/2023 1434   MCHC 33.9 08/30/2014 1031   RDW 12.6 10/18/2023 1434   Iron Studies    Component Value Date/Time   IRON 68 10/27/2022 1056   TIBC 313 10/27/2022 1056   FERRITIN 41 10/27/2022 1056   IRONPCTSAT 22 10/27/2022 1056   Lipid Panel     Component Value Date/Time  CHOL 149 02/23/2023 1305   TRIG 72 02/23/2023 1305   HDL 50 02/23/2023 1305   LDLCALC 85 02/23/2023 1305   Hepatic Function Panel     Component Value Date/Time   PROT 6.7 11/17/2023 0917   ALBUMIN 4.2 11/17/2023 0917   AST 9 11/17/2023 0917   ALT 8 11/17/2023 0917   ALKPHOS 80 11/17/2023 0917   BILITOT 0.5 11/17/2023 0917      Component Value Date/Time   TSH 1.690 02/23/2023 1305   Nutritional Lab Results  Component Value Date   VD25OH 55.6 02/23/2023   VD25OH 38.1 09/23/2021   VD25OH 34.1 12/22/2016    Follow-Up   Return in about 4 weeks (around 01/19/2024) for For Weight Mangement with Dr. Allie Area.Aaron Aas She was informed of the importance of frequent follow up visits to maximize her success with intensive lifestyle modifications for her multiple health conditions.  Attestation  Statement   Reviewed by clinician on day of visit: allergies, medications, problem list, medical history, surgical history, family history, social history, and previous encounter notes.     Ladd Picker, MD

## 2023-12-22 NOTE — Assessment & Plan Note (Signed)
 Her hemoglobin A1c is 5.2 her insulin levels are now at 4.8 which are optimal and reduced from 25.  This is a significant improvement in insulin resistance.  She is currently on metformin XR twice daily without any adverse effect.  She will continue medication.  She also maintains a diet with a low glycemic load.

## 2023-12-29 NOTE — Progress Notes (Signed)
Stable MRI brain. No new findings. -VRP

## 2023-12-30 ENCOUNTER — Other Ambulatory Visit (INDEPENDENT_AMBULATORY_CARE_PROVIDER_SITE_OTHER): Payer: Self-pay | Admitting: Internal Medicine

## 2023-12-30 DIAGNOSIS — E88819 Insulin resistance, unspecified: Secondary | ICD-10-CM

## 2024-01-04 ENCOUNTER — Other Ambulatory Visit (INDEPENDENT_AMBULATORY_CARE_PROVIDER_SITE_OTHER): Payer: Self-pay

## 2024-01-04 DIAGNOSIS — D696 Thrombocytopenia, unspecified: Secondary | ICD-10-CM

## 2024-01-04 DIAGNOSIS — Z0289 Encounter for other administrative examinations: Secondary | ICD-10-CM

## 2024-01-05 LAB — CBC WITH DIFFERENTIAL/PLATELET
Basophils Absolute: 0 10*3/uL (ref 0.0–0.2)
Basos: 1 %
EOS (ABSOLUTE): 0.1 10*3/uL (ref 0.0–0.4)
Eos: 1 %
Hematocrit: 42.1 % (ref 34.0–46.6)
Hemoglobin: 13.9 g/dL (ref 11.1–15.9)
Immature Grans (Abs): 0 10*3/uL (ref 0.0–0.1)
Immature Granulocytes: 0 %
Lymphocytes Absolute: 1.7 10*3/uL (ref 0.7–3.1)
Lymphs: 28 %
MCH: 31.4 pg (ref 26.6–33.0)
MCHC: 33 g/dL (ref 31.5–35.7)
MCV: 95 fL (ref 79–97)
Monocytes Absolute: 0.4 10*3/uL (ref 0.1–0.9)
Monocytes: 7 %
Neutrophils Absolute: 3.9 10*3/uL (ref 1.4–7.0)
Neutrophils: 63 %
Platelets: 291 10*3/uL (ref 150–450)
RBC: 4.42 x10E6/uL (ref 3.77–5.28)
RDW: 13 % (ref 11.7–15.4)
WBC: 6.1 10*3/uL (ref 3.4–10.8)

## 2024-01-12 ENCOUNTER — Other Ambulatory Visit (HOSPITAL_COMMUNITY)
Admission: RE | Admit: 2024-01-12 | Discharge: 2024-01-12 | Disposition: A | Discharge status: Home / Self Care | Payer: Self-pay | Source: Ambulatory Visit | Attending: Oncology | Admitting: Oncology

## 2024-01-12 ENCOUNTER — Other Ambulatory Visit: Payer: Self-pay

## 2024-01-12 MED ORDER — OXYBUTYNIN CHLORIDE ER 10 MG PO TB24: 10.0000 mg | ORAL_TABLET | Freq: Every day | ORAL | 3 refills | Status: DC | PRN

## 2024-01-20 ENCOUNTER — Encounter (INDEPENDENT_AMBULATORY_CARE_PROVIDER_SITE_OTHER): Payer: Self-pay | Admitting: Internal Medicine

## 2024-01-20 ENCOUNTER — Ambulatory Visit (INDEPENDENT_AMBULATORY_CARE_PROVIDER_SITE_OTHER): Payer: Medicare HMO | Admitting: Internal Medicine

## 2024-01-20 VITALS — BP 129/83 | HR 75 | Temp 98.1°F | Ht 65.0 in | Wt 248.0 lb

## 2024-01-20 DIAGNOSIS — N951 Menopausal and female climacteric states: Secondary | ICD-10-CM | POA: Diagnosis not present

## 2024-01-20 DIAGNOSIS — E66813 Obesity, class 3: Secondary | ICD-10-CM | POA: Diagnosis not present

## 2024-01-20 DIAGNOSIS — G35 Multiple sclerosis: Secondary | ICD-10-CM

## 2024-01-20 DIAGNOSIS — Z6841 Body Mass Index (BMI) 40.0 and over, adult: Secondary | ICD-10-CM

## 2024-01-20 DIAGNOSIS — E88819 Insulin resistance, unspecified: Secondary | ICD-10-CM

## 2024-01-20 NOTE — Assessment & Plan Note (Signed)
See obesity treatment plan

## 2024-01-20 NOTE — Assessment & Plan Note (Signed)
Patient has been experiencing some hot flashes as well as sweating, she is also noticing irregularities in her cycle.  She denies any symptoms of depression but acknowledges crying easily.  She has been working on cooling measures, she will can with these and monitor for worsening symptoms.  She has a follow-up with her PCP in March if her symptoms worsen she may want to consider nonhormonal treatment options for her symptoms.

## 2024-01-20 NOTE — Assessment & Plan Note (Signed)
Her hemoglobin A1c is 5.2 her insulin levels are now at 4.8 which are optimal and reduced from 25.  This is a significant improvement in insulin resistance.  She is currently on metformin XR twice daily without any adverse effect.  She will continue medication.  She also maintains a diet with a low glycemic load.

## 2024-01-20 NOTE — Progress Notes (Signed)
Office: [phone]  /  Fax: [phone]  Weight Summary And Biometrics  Vitals Temp: 98.1 F (36.7 C) BP: 129/83 Pulse Rate: 75 SpO2: 96 %   Anthropometric Measurements Height: 5\' 5"  (1.651 m) Weight: 248 lb (112.5 kg) BMI (Calculated): 41.27 Weight at Last Visit: 248 lb Weight Lost Since Last Visit: 0 lb Weight Gained Since Last Visit: 0 lb Starting Weight: 309 lb Total Weight Loss (lbs): 61 lb (27.7 kg)   No data recorded  No data recorded Today's Visit #: 13  Starting Date: 02/22/23   Subjective   Chief Complaint: Obesity  Ebony Warren is here to discuss her progress with her obesity treatment plan. She is on the the Category 2 Plan and states she is following her eating plan approximately 20 % of the time. She states she is exercising 30 minutes 7 times per week.  Weight Progress Since Last Visit:  Since last office visit she has maintained weight.  She has had a slight deviation from her meal plan due to her mom traveling due to death in the family where she had to rely on convenient foods.  She is also noticed an increased cravings for sweets but has not only been eating 3 candies at a time. She reports had recent lapse due to family or work She has been working on getting back on track  Challenges affecting patient progress: orthopedic problems, medical conditions or chronic pain affecting mobility.   Orexigenic Control: Denies problems with appetite and hunger signals.  Denies problems with satiety and satiation.  Denies problems with eating patterns and portion control.  Reports abnormal cravings for sweets Denies feeling deprived or restricted.   Pharmacotherapy for weight management: She is currently taking Metformin (off label use for incretin effect and / or insulin resistance and / or diabetes prevention) with adequate clinical response  and without side effects..   Assessment and Plan   Treatment Plan For Obesity:  Recommended Dietary  Goals  [name] is currently in the action stage of change. As such, her goal is to continue weight management plan. She has agreed to: continue current plan  Behavioral Health and Counseling  We discussed the following behavioral modification strategies today: continue to work on maintaining a reduced calorie state, getting the recommended amount of protein, incorporating whole foods, making healthy choices, staying well hydrated and practicing mindfulness when eating..  Additional education and resources provided today: None  Recommended Physical Activity Goals  [name] has been advised to work up to 150 minutes of moderate intensity aerobic activity a week and strengthening exercises 2-3 times per week for cardiovascular health, weight loss maintenance and preservation of muscle mass.   She has agreed to :  continue to gradually increase the amount and intensity of exercise routine  Pharmacotherapy  We discussed various medication options to help Verdis with her weight loss efforts and we both agreed to : adequate clinical response to current dose, continue current regimen  Associated Conditions Impacted by Obesity Treatment  Perimenopausal symptoms Assessment & Plan: Patient has been experiencing some hot flashes as well as sweating, she is also noticing irregularities in her cycle.  She denies any symptoms of depression but acknowledges crying easily.  She has been working on cooling measures, she will can with these and monitor for worsening symptoms.  She has a follow-up with her PCP in March if her symptoms worsen she may want to consider nonhormonal treatment options for her symptoms.   Multiple sclerosis (HCC) Assessment & Plan:  [name] feels that her MS is progressing.  She recently had an MRI which she states did not show any activity.  She continues on Ocrevus and has been doing her exercises at home but is noticing diminishing motor activity.  She has lost a  significant amount of weight and again emphasized the importance of engaging in physical activity as tolerated and making sure that she is getting at least 30 g of protein 3 times a day for muscle mass preservation.   Insulin resistance Assessment & Plan: Her hemoglobin A1c is 5.2 her insulin levels are now at 4.8 which are optimal and reduced from 25.  This is a significant improvement in insulin resistance.  She is currently on metformin XR twice daily without any adverse effect.  She will continue medication.  She also maintains a diet with a low glycemic load.   Class 3 severe obesity with serious comorbidity and body mass index (BMI) of 45.0 to 49.9 in adult, unspecified obesity type [facility]) Assessment & Plan: See obesity treatment plan      Objective   Physical Exam:  Blood pressure 129/83, pulse 75, temperature 98.1 F (36.7 C), height 5\' 5"  (1.651 m), weight 248 lb (112.5 kg), SpO2 96%. Body mass index is 41.27 kg/m.  General: She is overweight, cooperative, alert, well developed, and in no acute distress. PSYCH: Has normal mood, affect and thought process.   HEENT: EOMI, sclerae are anicteric. Lungs: Normal breathing effort, no conversational dyspnea. Extremities: No edema.  Neurologic: No gross sensory or motor deficits. No tremors or fasciculations noted.    Diagnostic Data Reviewed:  BMET    Component Value Date/Time   NA 142 11/17/2023 0917   K 3.9 11/17/2023 0917   CL 108 (H) 11/17/2023 0917   CO2 19 (L) 11/17/2023 0917   GLUCOSE 76 11/17/2023 0917   GLUCOSE 85 06/23/2021 1136   BUN 12 11/17/2023 0917   CREATININE 0.81 11/17/2023 0917   CALCIUM 9.2 11/17/2023 0917   GFRNONAA 76 12/10/2020 1251   GFRAA 87 12/10/2020 1251   Lab Results  Component Value Date   HGBA1C 5.2 11/26/2023   HGBA1C 5.3 08/19/2022   Lab Results  Component Value Date   INSULIN 4.8 11/17/2023   INSULIN 25.0 (H) 02/23/2023   Lab Results  Component Value Date   TSH 1.690  02/23/2023   CBC    Component Value Date/Time   WBC 6.1 01/04/2024 1352   WBC 7.3 08/30/2014 1031   RBC 4.42 01/04/2024 1352   RBC 4.07 08/30/2014 1031   HGB 13.9 01/04/2024 1352   HCT 42.1 01/04/2024 1352   PLT 291 01/04/2024 1352   MCV 95 01/04/2024 1352   MCH 31.4 01/04/2024 1352   MCH 31.4 08/30/2014 1031   MCHC 33.0 01/04/2024 1352   MCHC 33.9 08/30/2014 1031   RDW 13.0 01/04/2024 1352   Iron Studies    Component Value Date/Time   IRON 68 10/27/2022 1056   TIBC 313 10/27/2022 1056   FERRITIN 41 10/27/2022 1056   IRONPCTSAT 22 10/27/2022 1056   Lipid Panel     Component Value Date/Time   CHOL 149 02/23/2023 1305   TRIG 72 02/23/2023 1305   HDL 50 02/23/2023 1305   LDLCALC 85 02/23/2023 1305   Hepatic Function Panel     Component Value Date/Time   PROT 6.7 11/17/2023 0917   ALBUMIN 4.2 11/17/2023 0917   AST 9 11/17/2023 0917   ALT 8 11/17/2023 0917   ALKPHOS 80 11/17/2023 0917  BILITOT 0.5 11/17/2023 0917      Component Value Date/Time   TSH 1.690 02/23/2023 1305   Nutritional Lab Results  Component Value Date   VD25OH 55.6 02/23/2023   VD25OH 38.1 09/23/2021   VD25OH 34.1 12/22/2016    Follow-Up   Return in about 4 weeks (around 02/17/2024) for For Weight Mangement with Dr. Rikki Spearing.Marland Kitchen She was informed of the importance of frequent follow up visits to maximize her success with intensive lifestyle modifications for her multiple health conditions.  Attestation Statement   Reviewed by clinician on day of visit: allergies, medications, problem list, medical history, surgical history, family history, social history, and previous encounter notes.     Worthy Rancher, MD

## 2024-01-20 NOTE — Assessment & Plan Note (Signed)
Ebony Warren feels that her MS is progressing.  She recently had an MRI which she states did not show any activity.  She continues on Ocrevus and has been doing her exercises at home but is noticing diminishing motor activity.  She has lost a significant amount of weight and again emphasized the importance of engaging in physical activity as tolerated and making sure that she is getting at least 30 g of protein 3 times a day for muscle mass preservation.

## 2024-01-21 DIAGNOSIS — E1142 Type 2 diabetes mellitus with diabetic polyneuropathy: Secondary | ICD-10-CM | POA: Diagnosis not present

## 2024-01-21 DIAGNOSIS — D84821 Immunodeficiency due to drugs: Secondary | ICD-10-CM | POA: Diagnosis not present

## 2024-01-21 DIAGNOSIS — Z809 Family history of malignant neoplasm, unspecified: Secondary | ICD-10-CM | POA: Diagnosis not present

## 2024-01-21 DIAGNOSIS — K5900 Constipation, unspecified: Secondary | ICD-10-CM | POA: Diagnosis not present

## 2024-01-21 DIAGNOSIS — G40909 Epilepsy, unspecified, not intractable, without status epilepticus: Secondary | ICD-10-CM | POA: Diagnosis not present

## 2024-01-21 DIAGNOSIS — Z008 Encounter for other general examination: Secondary | ICD-10-CM | POA: Diagnosis not present

## 2024-01-21 DIAGNOSIS — I10 Essential (primary) hypertension: Secondary | ICD-10-CM | POA: Diagnosis not present

## 2024-01-21 DIAGNOSIS — R32 Unspecified urinary incontinence: Secondary | ICD-10-CM | POA: Diagnosis not present

## 2024-01-21 DIAGNOSIS — F325 Major depressive disorder, single episode, in full remission: Secondary | ICD-10-CM | POA: Diagnosis not present

## 2024-01-21 DIAGNOSIS — Z8249 Family history of ischemic heart disease and other diseases of the circulatory system: Secondary | ICD-10-CM | POA: Diagnosis not present

## 2024-01-21 DIAGNOSIS — G35 Multiple sclerosis: Secondary | ICD-10-CM | POA: Diagnosis not present

## 2024-01-21 DIAGNOSIS — Z87891 Personal history of nicotine dependence: Secondary | ICD-10-CM | POA: Diagnosis not present

## 2024-01-25 LAB — GENECONNECT MOLECULAR SCREEN: Genetic Analysis Overall Interpretation: NEGATIVE

## 2024-01-27 DIAGNOSIS — F411 Generalized anxiety disorder: Secondary | ICD-10-CM | POA: Diagnosis not present

## 2024-01-27 DIAGNOSIS — F330 Major depressive disorder, recurrent, mild: Secondary | ICD-10-CM | POA: Diagnosis not present

## 2024-02-03 NOTE — Progress Notes (Signed)
 Normal CBC. -VRP

## 2024-02-17 ENCOUNTER — Ambulatory Visit (INDEPENDENT_AMBULATORY_CARE_PROVIDER_SITE_OTHER): Payer: Medicare HMO | Admitting: Internal Medicine

## 2024-02-17 VITALS — BP 116/71 | HR 65 | Temp 98.5°F | Ht 68.0 in | Wt 234.0 lb

## 2024-02-17 DIAGNOSIS — Z6841 Body Mass Index (BMI) 40.0 and over, adult: Secondary | ICD-10-CM | POA: Diagnosis not present

## 2024-02-17 DIAGNOSIS — E88819 Insulin resistance, unspecified: Secondary | ICD-10-CM | POA: Diagnosis not present

## 2024-02-17 DIAGNOSIS — E66813 Obesity, class 3: Secondary | ICD-10-CM | POA: Diagnosis not present

## 2024-02-17 DIAGNOSIS — I10 Essential (primary) hypertension: Secondary | ICD-10-CM | POA: Diagnosis not present

## 2024-02-17 DIAGNOSIS — L304 Erythema intertrigo: Secondary | ICD-10-CM | POA: Diagnosis not present

## 2024-02-17 MED ORDER — NYSTATIN 100000 UNIT/GM EX POWD
1.0000 | Freq: Two times a day (BID) | CUTANEOUS | 1 refills | Status: AC
Start: 2024-02-17 — End: ?

## 2024-02-17 MED ORDER — METFORMIN HCL ER 500 MG PO TB24: 500.0000 mg | ORAL_TABLET | Freq: Two times a day (BID) | ORAL | 0 refills | Status: DC

## 2024-02-17 NOTE — Assessment & Plan Note (Signed)
 New periumbilical likely due to redundant skin and moisture.  She will be prescribed nystatin powder twice a day for 12 days.  We also discussed moisture management strategy.

## 2024-02-17 NOTE — Assessment & Plan Note (Signed)
 Her hemoglobin A1c is 5.2 her insulin levels are now at 4.8 which are optimal and reduced from 25.  This is a significant improvement in insulin resistance.  She is currently on metformin XR twice daily without any adverse effect.  She will continue medication.  She also maintains a diet with a low glycemic load.

## 2024-02-17 NOTE — Assessment & Plan Note (Signed)
 Ebony Warren has lost 75 pounds or 25% of total body weight in 12 months following medically supervised weight management plan.  She has increased physical activity to 7 days a week and appears to be exercise sensitive.  She is also back on track on her plan as her mother helps her with meal prep.  She has adequate appetite control and is currently on metformin.  She is also on topiramate for her MS which has been helpful.  She had been on gabapentin before that and has lost weight due to changes.

## 2024-02-17 NOTE — Progress Notes (Signed)
 Office: [phone]  /  Fax: [phone]  Weight Summary And Biometrics  Vitals Temp: 98.5 F (36.9 C) BP: 116/71 Pulse Rate: 65 SpO2: 100 %   Anthropometric Measurements Height: 5\' 8"  (1.727 m) Weight: 234 lb (106.1 kg) BMI (Calculated): 35.59 Weight at Last Visit: 248 lb Weight Lost Since Last Visit: 14 lb Weight Gained Since Last Visit: 0 Starting Weight: 309 lb Total Weight Loss (lbs): 75 lb (34 kg)   No data recorded  No data recorded Today's Visit #: 14  Starting Date: 02/22/23   Subjective   Chief Complaint: Obesity  Ebony Warren is here to discuss her progress with her obesity treatment plan. She is on the the Category 2 Plan and states she is following her eating plan approximately 60-70% of the time. She states she is exercising 15-30 minutes 7 times per week.  Weight Progress Since Last Visit:  Since last office visit she has lost 14 pounds. She reports good adherence to reduced calorie nutritional plan. She has been working on reading food labels, not skipping meals, increasing protein intake at every meal, drinking more water, making healthier choices, reducing portion sizes, and incorporating more whole foods . Increased PA and also back on meal plan.   Challenges affecting patient progress: none.   Orexigenic Control: Denies problems with appetite and hunger signals.  Denies problems with satiety and satiation.  Denies problems with eating patterns and portion control.  Denies abnormal cravings. Denies feeling deprived or restricted.   Pharmacotherapy for weight management: She is currently taking Metformin (off label use for incretin effect and / or insulin resistance and / or diabetes prevention) with adequate clinical response  and without side effects..   Assessment and Plan   Treatment Plan For Obesity:  Recommended Dietary Goals  Ebony Warren is currently in the action stage of change. As such, her goal is to continue weight management  plan. She has agreed to: continue current plan  Behavioral Health and Counseling  We discussed the following behavioral modification strategies today: continue to work on maintaining a reduced calorie state, getting the recommended amount of protein, incorporating whole foods, making healthy choices, staying well hydrated and practicing mindfulness when eating..  Additional education and resources provided today: None  Recommended Physical Activity Goals  Ebony Warren has been advised to work up to 150 minutes of moderate intensity aerobic activity a week and strengthening exercises 2-3 times per week for cardiovascular health, weight loss maintenance and preservation of muscle mass.   She has agreed to :  Think about enjoyable ways to increase daily physical activity and overcoming barriers to exercise and Increase physical activity in their day and reduce sedentary time (increase NEAT).  Pharmacotherapy  We discussed various medication options to help Ebony Warren with her weight loss efforts and we both agreed to : adequate clinical response to current dose, continue current regimen  Associated Conditions Impacted by Obesity Treatment  Essential hypertension Assessment & Plan: Her blood pressure is well-controlled.  She is no longer requiring ARB.  Continue to monitor   Insulin resistance Assessment & Plan: Her hemoglobin A1c is 5.2 her insulin levels are now at 4.8 which are optimal and reduced from 25.  This is a significant improvement in insulin resistance.  She is currently on metformin XR twice daily without any adverse effect.  She will continue medication.  She also maintains a diet with a low glycemic load.  Orders: -     metFORMIN HCl ER; Take 1 tablet (500 mg total)  by mouth 2 (two) times daily with a meal.  Dispense: 180 tablet; Refill: 0  Class 3 severe obesity with serious comorbidity and body mass index (BMI) of 45.0 to 49.9 in adult, unspecified obesity type  [facility]) Assessment & Plan: Ebony Warren has lost 75 pounds or 25% of total body weight in 12 months following medically supervised weight management plan.  She has increased physical activity to 7 days a week and appears to be exercise sensitive.  She is also back on track on her plan as her mother helps her with meal prep.  She has adequate appetite control and is currently on metformin.  She is also on topiramate for her MS which has been helpful.  She had been on gabapentin before that and has lost weight due to changes.   Intertrigo Assessment & Plan: New periumbilical likely due to redundant skin and moisture.  She will be prescribed nystatin powder twice a day for 12 days.  We also discussed moisture management strategy.  Orders: -     Nystatin; Apply 1 Application topically 2 (two) times daily.  Dispense: 60 g; Refill: 1     Objective   Physical Exam:  Blood pressure 116/71, pulse 65, temperature 98.5 F (36.9 C), height 5\' 8"  (1.727 m), weight 234 lb (106.1 kg), SpO2 100%. Body mass index is 35.58 kg/m.  General: She is overweight, cooperative, alert, well developed, and in no acute distress. PSYCH: Has normal mood, affect and thought process.   HEENT: EOMI, sclerae are anicteric. Lungs: Normal breathing effort, no conversational dyspnea. Extremities: No edema.  Neurologic: No gross sensory or motor deficits. No tremors or fasciculations noted.    Diagnostic Data Reviewed:  BMET    Component Value Date/Time   NA 142 11/17/2023 0917   K 3.9 11/17/2023 0917   CL 108 (H) 11/17/2023 0917   CO2 19 (L) 11/17/2023 0917   GLUCOSE 76 11/17/2023 0917   GLUCOSE 85 06/23/2021 1136   BUN 12 11/17/2023 0917   CREATININE 0.81 11/17/2023 0917   CALCIUM 9.2 11/17/2023 0917   GFRNONAA 76 12/10/2020 1251   GFRAA 87 12/10/2020 1251   Lab Results  Component Value Date   HGBA1C 5.2 11/26/2023   HGBA1C 5.3 08/19/2022   Lab Results  Component Value Date   INSULIN 4.8 11/17/2023    INSULIN 25.0 (H) 02/23/2023   Lab Results  Component Value Date   TSH 1.690 02/23/2023   CBC    Component Value Date/Time   WBC 6.1 01/04/2024 1352   WBC 7.3 08/30/2014 1031   RBC 4.42 01/04/2024 1352   RBC 4.07 08/30/2014 1031   HGB 13.9 01/04/2024 1352   HCT 42.1 01/04/2024 1352   PLT 291 01/04/2024 1352   MCV 95 01/04/2024 1352   MCH 31.4 01/04/2024 1352   MCH 31.4 08/30/2014 1031   MCHC 33.0 01/04/2024 1352   MCHC 33.9 08/30/2014 1031   RDW 13.0 01/04/2024 1352   Iron Studies    Component Value Date/Time   IRON 68 10/27/2022 1056   TIBC 313 10/27/2022 1056   FERRITIN 41 10/27/2022 1056   IRONPCTSAT 22 10/27/2022 1056   Lipid Panel     Component Value Date/Time   CHOL 149 02/23/2023 1305   TRIG 72 02/23/2023 1305   HDL 50 02/23/2023 1305   LDLCALC 85 02/23/2023 1305   Hepatic Function Panel     Component Value Date/Time   PROT 6.7 11/17/2023 0917   ALBUMIN 4.2 11/17/2023 0917   AST 9  11/17/2023 0917   ALT 8 11/17/2023 0917   ALKPHOS 80 11/17/2023 0917   BILITOT 0.5 11/17/2023 0917      Component Value Date/Time   TSH 1.690 02/23/2023 1305   Nutritional Lab Results  Component Value Date   VD25OH 55.6 02/23/2023   VD25OH 38.1 09/23/2021   VD25OH 34.1 12/22/2016    Medications: Outpatient Encounter Medications as of 02/17/2024  Medication Sig Note   ARIPiprazole (ABILIFY) 5 MG tablet Take 5 mg by mouth daily. 06/19/2021: Pt is taking this medication   baclofen (LIORESAL) 20 MG tablet Take 1 tablet (20 mg total) by mouth 3 (three) times daily.    cholecalciferol (VITAMIN D) 1000 UNITS tablet Take 1,000 Units by mouth daily.    diphenhydrAMINE (BENADRYL) 25 mg capsule Take 25 mg by mouth every 6 (six) hours as needed for allergies.    DULoxetine (CYMBALTA) 30 MG capsule Take 1 capsule by mouth daily.    LORazepam (ATIVAN) 1 MG tablet Take 1 tablet (1 mg total) by mouth daily.    losartan (COZAAR) 50 MG tablet Take 1 tablet (50 mg total) by mouth  daily.    Multiple Vitamins-Minerals (MULTIVITAMIN ADULT PO) Take 1 tablet by mouth daily.    nystatin (MYCOSTATIN/NYSTOP) powder Apply 1 Application topically 2 (two) times daily.    ocrelizumab (OCREVUS) 300 MG/10ML injection Infuse 600 mg IV Every 6 months 06/10/2021: 06/10/21 Medicare PArt D approved 12/07/20 - 06/10/2022.     oxybutynin (DITROPAN-XL) 10 MG 24 hr tablet Take 1 tablet (10 mg total) by mouth daily as needed (to prevent incontinence).    pantoprazole (PROTONIX) 40 MG tablet Take 1 tablet (40 mg total) by mouth daily.    polyethylene glycol powder (GLYCOLAX/MIRALAX) 17 GM/SCOOP powder Take 17 g by mouth 2 (two) times daily as needed.    Potassium (POTASSIMIN PO) Take by mouth daily at 12 noon.    tiZANidine (ZANAFLEX) 4 MG tablet Take 1 tablet (4 mg total) by mouth 3 (three) times daily.    topiramate (TOPAMAX) 50 MG tablet Take 1 tablet (50 mg total) by mouth 2 (two) times daily.    UNABLE TO FIND     [DISCONTINUED] metFORMIN (GLUCOPHAGE-XR) 500 MG 24 hr tablet Take 1 tablet (500 mg total) by mouth 2 (two) times daily with a meal.    metFORMIN (GLUCOPHAGE-XR) 500 MG 24 hr tablet Take 1 tablet (500 mg total) by mouth 2 (two) times daily with a meal.    Facility-Administered Encounter Medications as of 02/17/2024  Medication   diphenhydrAMINE (BENADRYL) injection 50 mg   famotidine (PEPCID) 20 mg in sodium chloride 0.9 % 50 mL IVPB   methylPREDNISolone sodium succinate (SOLU-MEDROL) 125 mg in sodium chloride 0.9 % 50 mL IVPB   ocrelizumab (OCREVUS) 600 mg in sodium chloride 0.9 % 250 mL     Follow-Up   Return in about 4 weeks (around 03/16/2024) for For Weight Mangement with Dr. Rikki Spearing.Marland Kitchen She was informed of the importance of frequent follow up visits to maximize her success with intensive lifestyle modifications for her multiple health conditions.  Attestation Statement   Reviewed by clinician on day of visit: allergies, medications, problem list, medical history, surgical  history, family history, social history, and previous encounter notes.     Worthy Rancher, MD

## 2024-02-17 NOTE — Assessment & Plan Note (Signed)
 Her blood pressure is well-controlled.  She is no longer requiring ARB.  Continue to monitor

## 2024-02-20 ENCOUNTER — Other Ambulatory Visit: Payer: Self-pay | Admitting: Diagnostic Neuroimaging

## 2024-02-21 NOTE — Telephone Encounter (Signed)
 Last seen on 10/18/23 per note " tried and failed gabapentin "  Follow up scheduled on 06/19/24

## 2024-02-28 NOTE — Progress Notes (Unsigned)
 [facility]: Clinic Note  Subjective:  History of Present Illness: Ebony Warren is a 48 y.o. year old female who presents for routine follow up of her chronic medical conditions. I saw her last 03/2023.  She's doing well overall.   She had been diagnosed with intertrigo with a fungal infection in her abdominal folds, and has been using nystatin powder for 2 weeks, with improvement in the area. No fevers or chills. She also has areas of darkened skin around fold in her right groin, but no erythema or pain.   She continues to follow with Neurology for MS, with [facility] for obesity, and with Psychiatry for her depression. Mood has been good. She swapped out gabapentin for topiramate, and has had less leg swelling with this change.   Menstrual periods have become irregular this year, she thinks she is beginning menopause.  She talked to GI who recommended starting colonoscopy at age 7, which would likely require direct admission to the hospital.   Please refer to Assessment and Plan below for full details in Problem-Based Charting.   Past Medical History:  Patient Active Problem List   Diagnosis Date Noted   Intertrigo 02/17/2024   Perimenopausal symptoms 01/20/2024   Constipation 06/16/2023   Leg edema 05/24/2023   Healthcare maintenance 03/24/2023   Family history of colon cancer 03/24/2023   Post-nasal drip 03/24/2023   Elevated ALT measurement 03/02/2023   Insulin resistance 03/02/2023   Essential hypertension 12/23/2022   Abnormal menses 10/27/2022   Macromastia 06/23/2021   Gastroesophageal reflux disease 01/22/2021   Class 3 severe obesity with serious comorbidity and body mass index (BMI) of 45.0 to 49.9 in adult (HCC) 01/22/2021   Generalized anxiety disorder 10/07/2018   Right optic neuritis 05/13/2016   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   Depression 01/27/2016   Absence of bladder continence  01/27/2016   Multiple sclerosis (HCC) 10/20/2014   Hemiplegia of dominant side (HCC) 08/30/2014      Medications:  Current Outpatient Medications:    ARIPiprazole (ABILIFY) 5 MG tablet, Take 5 mg by mouth daily., Disp: , Rfl:    baclofen (LIORESAL) 20 MG tablet, Take 1 tablet (20 mg total) by mouth 3 (three) times daily., Disp: 270 tablet, Rfl: 4   cholecalciferol (VITAMIN D) 1000 UNITS tablet, Take 1,000 Units by mouth daily., Disp: , Rfl:    diphenhydrAMINE (BENADRYL) 25 mg capsule, Take 25 mg by mouth every 6 (six) hours as needed for allergies., Disp: , Rfl:    DULoxetine (CYMBALTA) 30 MG capsule, Take 1 capsule by mouth daily., Disp: , Rfl:    LORazepam (ATIVAN) 1 MG tablet, Take 1 tablet (1 mg total) by mouth daily., Disp: 14 tablet, Rfl: 0   losartan (COZAAR) 50 MG tablet, Take 1 tablet (50 mg total) by mouth daily., Disp: 90 tablet, Rfl: 3   metFORMIN (GLUCOPHAGE-XR) 500 MG 24 hr tablet, Take 1 tablet (500 mg total) by mouth 2 (two) times daily with a meal., Disp: 180 tablet, Rfl: 0   Multiple Vitamins-Minerals (MULTIVITAMIN ADULT PO), Take 1 tablet by mouth daily., Disp: , Rfl:    nystatin (MYCOSTATIN/NYSTOP) powder, Apply 1 Application topically 2 (two) times daily., Disp: 60 g, Rfl: 1   ocrelizumab (OCREVUS) 300 MG/10ML injection, Infuse 600 mg IV Every 6 months, Disp: 20 mL, Rfl: 1   oxybutynin (DITROPAN-XL) 10 MG 24 hr tablet, Take 1 tablet (10 mg total) by mouth daily as needed (to prevent  incontinence)., Disp: 90 tablet, Rfl: 3   pantoprazole (PROTONIX) 40 MG tablet, Take 1 tablet (40 mg total) by mouth daily., Disp: 90 tablet, Rfl: 3   polyethylene glycol powder (GLYCOLAX/MIRALAX) 17 GM/SCOOP powder, Take 17 g by mouth 2 (two) times daily as needed., Disp: 765 g, Rfl: 1   Potassium (POTASSIMIN PO), Take by mouth daily at 12 noon., Disp: , Rfl:    tiZANidine (ZANAFLEX) 4 MG tablet, Take 1 tablet (4 mg total) by mouth 3 (three) times daily., Disp: 270 tablet, Rfl: 4   topiramate  (TOPAMAX) 50 MG tablet, Take 1 tablet (50 mg total) by mouth 2 (two) times daily., Disp: 180 tablet, Rfl: 4   UNABLE TO FIND, , Disp: , Rfl:   Current Facility-Administered Medications:    diphenhydrAMINE (BENADRYL) injection 50 mg, 50 mg, Intravenous, Once, Penumalli, Vikram R, MD   famotidine (PEPCID) 20 mg in sodium chloride 0.9 % 50 mL IVPB, 20 mg, Intravenous, Once, Penumalli, Vikram R, MD   methylPREDNISolone sodium succinate (SOLU-MEDROL) 125 mg in sodium chloride 0.9 % 50 mL IVPB, 125 mg, Intravenous, Once, Penumalli, Vikram R, MD   ocrelizumab (OCREVUS) 600 mg in sodium chloride 0.9 % 250 mL, 600 mg, Intravenous, Once, Penumalli, Glenford Bayley, MD   Allergies: Allergies  Allergen Reactions   Bioflavonoids Hives   Oxycodone-Acetaminophen Itching   Hydrocodone Itching    Stomach cramps   Morphine Itching   Orange Fruit [Citrus]     Acidic foods Makes her itchy and her face "feels like its on fire", have rash.   Oxycodone Itching    rash   Tomato Hives   Other Rash    Narcotics. Spicy foods (cause seizures)       Objective:   Vitals: Vitals:   03/01/24 1044  BP: 126/70  Pulse: 71  Temp: 97.9 F (36.6 C)  SpO2: 100%     Physical Exam: Physical Exam Constitutional:      Appearance: Normal appearance.     Comments: Sitting comfortably in wheelchair, NAD  Skin:    Comments: +erythema & hyperpigmented skin in skin folds in central abdomen and in right groin.   Neurological:     Mental Status: She is alert.  Psychiatric:        Mood and Affect: Mood normal.        Behavior: Behavior normal.      Data: Labs, imaging, and micro were reviewed in Epic. Refer to Assessment and Plan below for full details in Problem-Based Charting.  Assessment & Plan:  Intertrigo - I agree with Dr Lina Sayre diagnosis.  - This is improving - I counseled her on more moisture management strategies & provided a handout - If this happens again, will use nystatin powder again    Hemiplegia of dominant side (HCC) - Chronic and stable - Continue using motorized wheelchair  Multiple sclerosis (HCC) - Chronic and stable - Continue Baclofen 20mg  TID, Tizanidine 4mg  TID, Oxybutynin, and Topiramate  Depression - chronic and stable - Sees Dr Lilian Kapur in [facility] - Continue Cymbalta 30mg  daily      Patient will follow up in 3-6 months   Mercie Eon, MD

## 2024-03-01 ENCOUNTER — Encounter: Payer: Self-pay | Admitting: Internal Medicine

## 2024-03-01 ENCOUNTER — Ambulatory Visit: Payer: Medicare HMO | Admitting: Internal Medicine

## 2024-03-01 VITALS — BP 126/70 | HR 71 | Temp 97.9°F | Wt 242.1 lb

## 2024-03-01 DIAGNOSIS — L304 Erythema intertrigo: Secondary | ICD-10-CM | POA: Diagnosis not present

## 2024-03-01 DIAGNOSIS — Z1231 Encounter for screening mammogram for malignant neoplasm of breast: Secondary | ICD-10-CM

## 2024-03-01 DIAGNOSIS — F330 Major depressive disorder, recurrent, mild: Secondary | ICD-10-CM

## 2024-03-01 DIAGNOSIS — G35 Multiple sclerosis: Secondary | ICD-10-CM

## 2024-03-01 DIAGNOSIS — F32A Depression, unspecified: Secondary | ICD-10-CM | POA: Diagnosis not present

## 2024-03-01 DIAGNOSIS — G8190 Hemiplegia, unspecified affecting unspecified side: Secondary | ICD-10-CM | POA: Diagnosis not present

## 2024-03-01 NOTE — Assessment & Plan Note (Signed)
-   chronic and stable - Sees Dr Lilian Kapur in [facility] - Continue Cymbalta  daily

## 2024-03-01 NOTE — Assessment & Plan Note (Signed)
-   I agree with Dr Lina Sayre diagnosis.  - This is improving - I counseled her on more moisture management strategies & provided a handout - If this happens again, will use nystatin powder again

## 2024-03-01 NOTE — Assessment & Plan Note (Signed)
-   Chronic and stable - Continue using motorized wheelchair

## 2024-03-01 NOTE — Assessment & Plan Note (Signed)
-   Chronic and stable - Continue Baclofen 20mg  TID, Tizanidine 4mg  TID, Oxybutynin, and Topiramate

## 2024-03-01 NOTE — Patient Instructions (Signed)
 Thank you, Ebony Warren for allowing [name] to provide your care today. Today we discussed your intertrigo - I agree with using the nystatin powder for this, and I've attached some resources about this condition. I did not make any medicine changes today     I have ordered the following labs for you:  Lab Orders  No laboratory test(s) ordered today     Tests ordered today:  None  Referrals ordered today:   Referral Orders  No referral(s) requested today     I have ordered the following medication/changed the following medications:   Stop the following medications: There are no discontinued medications.   Start the following medications: No orders of the defined types were placed in this encounter.    Return in about 6 months (around 09/01/2024).     Should you have any questions or concerns please call the internal medicine clinic at [phone].     Mercie Eon, MD Faculty, Internal Medicine Teaching Progam [facility]

## 2024-03-11 ENCOUNTER — Other Ambulatory Visit (INDEPENDENT_AMBULATORY_CARE_PROVIDER_SITE_OTHER): Payer: Self-pay | Admitting: Internal Medicine

## 2024-03-11 DIAGNOSIS — E88819 Insulin resistance, unspecified: Secondary | ICD-10-CM

## 2024-03-13 ENCOUNTER — Other Ambulatory Visit: Payer: Self-pay

## 2024-03-13 ENCOUNTER — Telehealth: Payer: Self-pay | Admitting: *Deleted

## 2024-03-13 MED ORDER — LOSARTAN POTASSIUM 50 MG PO TABS: 50.0000 mg | ORAL_TABLET | Freq: Every day | ORAL | 3 refills | Status: DC

## 2024-03-13 NOTE — Telephone Encounter (Signed)
 Medication sent to pharmacy

## 2024-03-13 NOTE — Telephone Encounter (Signed)
 Mammogram appointment  already scheduled 06/30/24 @ 2:00 pm. / [facility].

## 2024-03-21 ENCOUNTER — Encounter (INDEPENDENT_AMBULATORY_CARE_PROVIDER_SITE_OTHER): Payer: Self-pay | Admitting: Internal Medicine

## 2024-03-21 ENCOUNTER — Ambulatory Visit (INDEPENDENT_AMBULATORY_CARE_PROVIDER_SITE_OTHER): Admitting: Internal Medicine

## 2024-03-21 VITALS — BP 115/78 | HR 67 | Temp 98.3°F | Ht 68.0 in | Wt 237.0 lb

## 2024-03-21 DIAGNOSIS — I10 Essential (primary) hypertension: Secondary | ICD-10-CM

## 2024-03-21 DIAGNOSIS — E88819 Insulin resistance, unspecified: Secondary | ICD-10-CM | POA: Diagnosis not present

## 2024-03-21 DIAGNOSIS — E66813 Obesity, class 3: Secondary | ICD-10-CM | POA: Diagnosis not present

## 2024-03-21 DIAGNOSIS — Z6841 Body Mass Index (BMI) 40.0 and over, adult: Secondary | ICD-10-CM | POA: Diagnosis not present

## 2024-03-21 DIAGNOSIS — R600 Localized edema: Secondary | ICD-10-CM | POA: Diagnosis not present

## 2024-03-21 NOTE — Assessment & Plan Note (Signed)
 Cathaleen has lost 75 pounds thus far.  She has gained three pounds since the last visit. She adheres to the category two meal plan 30-40% of the time, does not track calories, but consumes more whole foods, maintains adequate protein intake, hydration, and does not skip meals. She exercises five days a week for 30 minutes. The weight gain is not significant, with no clear pattern indicating a plateau. She is on topiramate and metformin, aiding appetite suppression. Water retention is suspected as a cause for weight gain. Phentermine was discussed as a potential addition if a plateau is confirmed, with side effects of dry mouth and the need to monitor blood pressure and heart rate.  - Continue current diet and exercise regimen - Monitor weight and dietary intake over the next four weeks - Consider adding phentermine if weight loss plateaus - Provide a meal plan for 1200 calories with seven days of meals - Take furosemide for three days to address water retention - Check for swelling and monitor fluid retention

## 2024-03-21 NOTE — Assessment & Plan Note (Signed)
 Her hemoglobin A1c is 5.2 her insulin levels are now at 4.8 which are optimal and reduced from 25.  This is a significant improvement in insulin resistance.  She is currently on metformin XR twice daily without any adverse effect.  She will continue medication.  She also maintains a diet with a low glycemic load.

## 2024-03-21 NOTE — Assessment & Plan Note (Addendum)
 She has pitting edema up to mid tibia.  She is on several medications that could cause leg swelling she also has a difficulty keeping her legs upright due to her MS she does not wear compression socks.  She has furosemide in her possession was advised to take 1 tablet of 20 mg once a day for 3 days.  No congestive pulmonary symptoms or history of heart failure.

## 2024-03-21 NOTE — Progress Notes (Signed)
 Office: [phone]  /  Fax: [phone]  Weight Summary And Biometrics  Vitals Temp: 98.3 F (36.8 C) BP: 115/78 Pulse Rate: 67 SpO2: 100 %   Anthropometric Measurements Height: 5\' 8"  (1.727 m) Weight: 237 lb (107.5 kg) BMI (Calculated): 36.04 Weight at Last Visit: 0 lb Weight Lost Since Last Visit: 3 lb Weight Gained Since Last Visit: 0 lb Starting Weight: 309 lb Total Weight Loss (lbs): 75 lb (34 kg)   No data recorded  No data recorded Today's Visit #: 15  Starting Date: 02/22/23   Subjective   Chief Complaint: Obesity  Interval History Discussed the use of AI scribe software for clinical note transcription with the patient, who gave verbal consent to proceed.   The patient presents for medical weight management.  She has gained three pounds since her last office visit, which she attributes to a possible error in weighing due to her wheelchair wheels not being fully on the scale during the previous measurement. She follows a category two meal plan about 30-40% of the time, consuming more whole foods, maintaining adequate protein intake, staying hydrated, and not skipping meals. She exercises five days a week for about 30 minutes, focusing on stretching. She is currently taking topiramate twice a day and metformin. She feels satisfied with her meals and does not experience hunger between meals. There have been no significant changes in her diet composition, and she denies an increase in carbohydrate intake or a decrease in protein or vegetable consumption.  Her menstrual cycle is sporadic and not occurring monthly. No swelling in her legs or feeling of puffiness, but she acknowledges some fluid retention as evidenced by pitting edema in her legs.  She reports improvement in a yeast infection, noting that it is no longer sore, although a scar remains. She is using a powder for management.  Her current medications include risartan, duloxetine, and Abilify at a  dose of 5 mg, with no changes reported. She has not taken a water pill recently but has some furosemide at home.  Challenges affecting patient progress: orthopedic problems, medical conditions or chronic pain affecting mobility and medical comorbidities.    Pharmacotherapy for weight management: She is currently taking Metformin (off label use for incretin effect and / or insulin resistance and / or diabetes prevention) with adequate clinical response  and without side effects. and Topiramate (off label use, single agent) with adequate clinical response  and without side effects..   Assessment and Plan   Treatment Plan For Obesity:  Recommended Dietary Goals  Ebony Warren is currently in the action stage of change. As such, her goal is to continue weight management plan. She has agreed to: continue current plan  Behavioral Health and Counseling  We discussed the following behavioral modification strategies today: continue to work on maintaining a reduced calorie state, getting the recommended amount of protein, incorporating whole foods, making healthy choices, staying well hydrated and practicing mindfulness when eating..  Additional education and resources provided today: None  Recommended Physical Activity Goals  Ebony Warren has been advised to work up to 150 minutes of moderate intensity aerobic activity a week and strengthening exercises 2-3 times per week for cardiovascular health, weight loss maintenance and preservation of muscle mass.   She has agreed to :  continue to gradually increase the amount and intensity of exercise routine  Pharmacotherapy  We discussed various medication options to help Ebony Warren with her weight loss efforts and we both agreed to : adequate clinical response to anti-obesity  medication, continue current regimen and consider adding low-dose phentermine to current regimen if she starts to plateau.  Associated Conditions Impacted by Obesity  Treatment  Essential hypertension Assessment & Plan: Her blood pressure is well-controlled.  She is no longer requiring ARB.  Continue to monitor   Insulin resistance Assessment & Plan: Her hemoglobin A1c is 5.2 her insulin levels are now at 4.8 which are optimal and reduced from 25.  This is a significant improvement in insulin resistance.  She is currently on metformin XR twice daily without any adverse effect.  She will continue medication.  She also maintains a diet with a low glycemic load.   Class 3 severe obesity with serious comorbidity and body mass index (BMI) of 45.0 to 49.9 in adult, unspecified obesity type Ebony Warren) Assessment & Plan: Ebony Warren has lost 75 pounds thus far.  She has gained three pounds since the last visit. She adheres to the category two meal plan 30-40% of the time, does not track calories, but consumes more whole foods, maintains adequate protein intake, hydration, and does not skip meals. She exercises five days a week for 30 minutes. The weight gain is not significant, with no clear pattern indicating a plateau. She is on topiramate and metformin, aiding appetite suppression. Water retention is suspected as a cause for weight gain. Phentermine was discussed as a potential addition if a plateau is confirmed, with side effects of dry mouth and the need to monitor blood pressure and heart rate.  - Continue current diet and exercise regimen - Monitor weight and dietary intake over the next four weeks - Consider adding phentermine if weight loss plateaus - Provide a meal plan for 1200 calories with seven days of meals - Take furosemide for three days to address water retention - Check for swelling and monitor fluid retention   Leg edema Assessment & Plan: She has pitting edema up to mid tibia.  She is on several medications that could cause leg swelling she also has a difficulty keeping her legs upright due to her MS she does not wear compression socks.  She has  furosemide in her possession was advised to take 1 tablet of 20 mg once a day for 3 days.  No congestive pulmonary symptoms or history of heart failure.       Follow-up Re-evaluation in four weeks to assess weight trends and management plan effectiveness. Accurate weight measurement with wheels on the scale is necessary. - Schedule follow-up appointment in four weeks - Ensure accurate weight measurement with wheels on the scale   Objective   Physical Exam:  Blood pressure 115/78, pulse 67, temperature 98.3 F (36.8 C), height 5\' 8"  (1.727 m), weight 237 lb (107.5 kg), SpO2 100%. Body mass index is 36.04 kg/m.  General: She is overweight, cooperative, alert, well developed, and in no acute distress. PSYCH: Has normal mood, affect and thought process.   HEENT: EOMI, sclerae are anicteric. Lungs: Normal breathing effort, no conversational dyspnea. Extremities: No edema.  Neurologic: No gross sensory or motor deficits. No tremors or fasciculations noted.    Diagnostic Data Reviewed:  BMET    Component Value Date/Time   NA 142 11/17/2023 0917   K 3.9 11/17/2023 0917   CL 108 (H) 11/17/2023 0917   CO2 19 (L) 11/17/2023 0917   GLUCOSE 76 11/17/2023 0917   GLUCOSE 85 06/23/2021 1136   BUN 12 11/17/2023 0917   CREATININE 0.81 11/17/2023 0917   CALCIUM 9.2 11/17/2023 0917   GFRNONAA 76 12/10/2020  1251   GFRAA 87 12/10/2020 1251   Lab Results  Component Value Date   HGBA1C 5.2 11/26/2023   HGBA1C 5.3 08/19/2022   Lab Results  Component Value Date   INSULIN 4.8 11/17/2023   INSULIN 25.0 (H) 02/23/2023   Lab Results  Component Value Date   TSH 1.690 02/23/2023   CBC    Component Value Date/Time   WBC 6.1 01/04/2024 1352   WBC 7.3 08/30/2014 1031   RBC 4.42 01/04/2024 1352   RBC 4.07 08/30/2014 1031   HGB 13.9 01/04/2024 1352   HCT 42.1 01/04/2024 1352   PLT 291 01/04/2024 1352   MCV 95 01/04/2024 1352   MCH 31.4 01/04/2024 1352   MCH 31.4 08/30/2014 1031    MCHC 33.0 01/04/2024 1352   MCHC 33.9 08/30/2014 1031   RDW 13.0 01/04/2024 1352   Iron Studies    Component Value Date/Time   IRON 68 10/27/2022 1056   TIBC 313 10/27/2022 1056   FERRITIN 41 10/27/2022 1056   IRONPCTSAT 22 10/27/2022 1056   Lipid Panel     Component Value Date/Time   CHOL 149 02/23/2023 1305   TRIG 72 02/23/2023 1305   HDL 50 02/23/2023 1305   LDLCALC 85 02/23/2023 1305   Hepatic Function Panel     Component Value Date/Time   PROT 6.7 11/17/2023 0917   ALBUMIN 4.2 11/17/2023 0917   AST 9 11/17/2023 0917   ALT 8 11/17/2023 0917   ALKPHOS 80 11/17/2023 0917   BILITOT 0.5 11/17/2023 0917      Component Value Date/Time   TSH 1.690 02/23/2023 1305   Nutritional Lab Results  Component Value Date   VD25OH 55.6 02/23/2023   VD25OH 38.1 09/23/2021   VD25OH 34.1 12/22/2016    Medications: Outpatient Encounter Medications as of 03/21/2024  Medication Sig Note   ARIPiprazole (ABILIFY) 5 MG tablet Take 5 mg by mouth daily. 06/19/2021: Pt is taking this medication   baclofen (LIORESAL) 20 MG tablet Take 1 tablet (20 mg total) by mouth 3 (three) times daily.    cholecalciferol (VITAMIN D) 1000 UNITS tablet Take 1,000 Units by mouth daily.    diphenhydrAMINE (BENADRYL) 25 mg capsule Take 25 mg by mouth every 6 (six) hours as needed for allergies.    DULoxetine (CYMBALTA) 30 MG capsule Take 1 capsule by mouth daily.    LORazepam (ATIVAN) 1 MG tablet Take 1 tablet (1 mg total) by mouth daily.    losartan (COZAAR) 50 MG tablet Take 1 tablet (50 mg total) by mouth daily.    metFORMIN (GLUCOPHAGE-XR) 500 MG 24 hr tablet Take 1 tablet (500 mg total) by mouth 2 (two) times daily with a meal.    Multiple Vitamins-Minerals (MULTIVITAMIN ADULT PO) Take 1 tablet by mouth daily.    nystatin (MYCOSTATIN/NYSTOP) powder Apply 1 Application topically 2 (two) times daily.    ocrelizumab (OCREVUS) 300 MG/10ML injection Infuse 600 mg IV Every 6 months 06/10/2021: 06/10/21 Medicare  PArt D approved 12/07/20 - 06/10/2022.     oxybutynin (DITROPAN-XL) 10 MG 24 hr tablet Take 1 tablet (10 mg total) by mouth daily as needed (to prevent incontinence).    pantoprazole (PROTONIX) 40 MG tablet Take 1 tablet (40 mg total) by mouth daily.    polyethylene glycol powder (GLYCOLAX/MIRALAX) 17 GM/SCOOP powder Take 17 g by mouth 2 (two) times daily as needed.    Potassium (POTASSIMIN PO) Take by mouth daily at 12 noon.    tiZANidine (ZANAFLEX) 4 MG tablet Take 1  tablet (4 mg total) by mouth 3 (three) times daily.    topiramate (TOPAMAX) 50 MG tablet Take 1 tablet (50 mg total) by mouth 2 (two) times daily.    UNABLE TO FIND     Facility-Administered Encounter Medications as of 03/21/2024  Medication   diphenhydrAMINE (BENADRYL) injection 50 mg   famotidine (PEPCID) 20 mg in sodium chloride 0.9 % 50 mL IVPB   methylPREDNISolone sodium succinate (SOLU-MEDROL) 125 mg in sodium chloride 0.9 % 50 mL IVPB   ocrelizumab (OCREVUS) 600 mg in sodium chloride 0.9 % 250 mL     Follow-Up   Return in about 4 weeks (around 04/18/2024) for For Weight Mangement with Dr. Allie Area.Aaron Aas She was informed of the importance of frequent follow up visits to maximize her success with intensive lifestyle modifications for her multiple health conditions.  Attestation Statement   Reviewed by clinician on day of visit: allergies, medications, problem list, medical history, surgical history, family history, social history, and previous encounter notes.     Ladd Picker, MD

## 2024-03-21 NOTE — Assessment & Plan Note (Signed)
 Her blood pressure is well-controlled.  She is no longer requiring ARB.  Continue to monitor

## 2024-03-22 ENCOUNTER — Telehealth: Payer: Self-pay

## 2024-03-22 NOTE — Telephone Encounter (Signed)
 I reviewed patient's chart. Unclear if there is a PA on file for Ocrevus due in July.

## 2024-03-23 NOTE — Telephone Encounter (Signed)
 Per office notes: "On Tecfidera since Feb 2016. Had flare up symptoms in Nov 2016 and Nov-Dec 2017. Also new plaque in Jan 2018. Now on Ocrevus since May/June 2018."   I called [employer] provider services phone# at [phone]. Spoke w/ rep. PA handled by pharmacy department. Phone: [phone]. Transferred and spoke w/ Tia/PA specialist. Jcode: J2350 (Ocrevus). Dx code: G66. Answered clinical questions over the phone. PA approved 03/23/24-03/23/25. Auth# [id number].   Last infusion: 12/15/2023, next infusion: 06/13/2024  [facility] sometimes depending on insurance plan) Address: [address] [place], [place], [place] [address] Phone: [phone] Fax: [phone] NPI: [id number] Tax ID: [id number]

## 2024-03-29 DIAGNOSIS — H47293 Other optic atrophy, bilateral: Secondary | ICD-10-CM | POA: Diagnosis not present

## 2024-03-29 DIAGNOSIS — H40023 Open angle with borderline findings, high risk, bilateral: Secondary | ICD-10-CM | POA: Diagnosis not present

## 2024-03-29 DIAGNOSIS — G35 Multiple sclerosis: Secondary | ICD-10-CM | POA: Diagnosis not present

## 2024-04-20 DIAGNOSIS — F330 Major depressive disorder, recurrent, mild: Secondary | ICD-10-CM | POA: Diagnosis not present

## 2024-04-20 DIAGNOSIS — F411 Generalized anxiety disorder: Secondary | ICD-10-CM | POA: Diagnosis not present

## 2024-04-27 ENCOUNTER — Ambulatory Visit (INDEPENDENT_AMBULATORY_CARE_PROVIDER_SITE_OTHER): Admitting: Internal Medicine

## 2024-04-27 ENCOUNTER — Encounter (INDEPENDENT_AMBULATORY_CARE_PROVIDER_SITE_OTHER): Payer: Self-pay | Admitting: Internal Medicine

## 2024-04-27 VITALS — BP 126/86 | HR 71 | Ht 68.0 in | Wt 229.0 lb

## 2024-04-27 DIAGNOSIS — E66813 Obesity, class 3: Secondary | ICD-10-CM

## 2024-04-27 DIAGNOSIS — Z6834 Body mass index (BMI) 34.0-34.9, adult: Secondary | ICD-10-CM | POA: Diagnosis not present

## 2024-04-27 DIAGNOSIS — R600 Localized edema: Secondary | ICD-10-CM | POA: Diagnosis not present

## 2024-04-27 DIAGNOSIS — G35 Multiple sclerosis: Secondary | ICD-10-CM

## 2024-04-27 DIAGNOSIS — E88819 Insulin resistance, unspecified: Secondary | ICD-10-CM

## 2024-04-27 DIAGNOSIS — I10 Essential (primary) hypertension: Secondary | ICD-10-CM

## 2024-04-27 DIAGNOSIS — Z6841 Body Mass Index (BMI) 40.0 and over, adult: Secondary | ICD-10-CM

## 2024-04-27 DIAGNOSIS — L568 Other specified acute skin changes due to ultraviolet radiation: Secondary | ICD-10-CM

## 2024-04-27 MED ORDER — METFORMIN HCL ER 500 MG PO TB24: 500.0000 mg | ORAL_TABLET | Freq: Two times a day (BID) | ORAL | 0 refills | Status: DC

## 2024-04-27 NOTE — Progress Notes (Signed)
 Office: [phone]  /  Fax: [phone]  Weight Summary And Biometrics  Vitals BP: 126/86 Pulse Rate: 71 SpO2: 100 %   Anthropometric Measurements Height: 5\' 8"  (1.727 m) Weight: 229 lb (103.9 kg) BMI (Calculated): 34.83 Weight at Last Visit: 237lb Weight Lost Since Last Visit: 7lb Weight Gained Since Last Visit: 0lb Starting Weight: 309lb Total Weight Loss (lbs): 82 lb (37.2 kg)   No data recorded  No data recorded Today's Visit #: 16  Starting Date: 02/22/23   Subjective   Chief Complaint: Obesity  Interval History Discussed the use of AI scribe software for clinical note transcription with the patient, who gave verbal consent to proceed.  History of Present Illness   Ebony Warren is a 48 year old female who presents for follow-up on weight management and swelling in her legs and feet.  She has successfully lost 82 pounds and is close to her goal of losing 100 pounds. She maintains a 1200 calorie diet and tracks her intake using the Fitbit app. Regular exercise is part of her routine, and she finds the app helpful in monitoring her activity levels.  She has experienced recent swelling in her legs and feet, which began two days ago. The swelling is significant and atypical for her. She elevates her legs three times a day and uses furosemide  as needed for swelling. No blisters or weeping are present, but she did cut her leg a few days ago.  She has increased sensitivity to sunlight, enjoying sitting outside but noticing changes in her skin, described as a dark brown texture. No itching or stinging is experienced.  She is currently taking metformin  and is due for a refill. Her last A1c was 5.2 in December. She also takes topiramate , and her kidney function is monitored regularly.       Challenges affecting patient progress: medical comorbidities and presence of obesogenic drugs.    Pharmacotherapy for weight management: She is currently taking  Metformin  (off label use for incretin effect and / or insulin  resistance and / or diabetes prevention) with adequate clinical response  and without side effects. and Topiramate  (off label use, single agent) with adequate clinical response  and without side effects..   Assessment and Plan   Treatment Plan For Obesity:  Recommended Dietary Goals  Ebony Warren is currently in the action stage of change. As such, her goal is to continue weight management plan. She has agreed to: continue current plan  Behavioral Health and Counseling  We discussed the following behavioral modification strategies today: continue to work on maintaining a reduced calorie state, getting the recommended amount of protein, incorporating whole foods, making healthy choices, staying well hydrated and practicing mindfulness when eating..  Additional education and resources provided today: None  Recommended Physical Activity Goals  Ebony Warren has been advised to work up to 150 minutes of moderate intensity aerobic activity a week and strengthening exercises 2-3 times per week for cardiovascular health, weight loss maintenance and preservation of muscle mass.   She has agreed to :  Continue current level of physical activity   Pharmacotherapy  We discussed various medication options to help Ebony Warren with her weight loss efforts and we both agreed to : adequate clinical response to anti-obesity medication, continue current regimen and will be due for disease monitoring labs at the next office visit  Associated Conditions Impacted by Obesity Treatment  Essential hypertension Assessment & Plan: Her blood pressure is well-controlled.  She is no longer requiring ARB.  Continue to  monitor.  Check renal parameters at the next office visit   Insulin  resistance Assessment & Plan: Her hemoglobin A1c is 5.2 her insulin  levels are now at 4.8 which are optimal and reduced from 25.  This is a significant improvement in insulin   resistance.  She is currently on metformin  XR twice daily without any adverse effect.  She will continue medication.  She also maintains a diet with a low glycemic load.  We will check hemoglobin A1c and B12 levels at the next office visit.  Orders: -     metFORMIN  HCl ER; Take 1 tablet (500 mg total) by mouth 2 (two) times daily with a meal.  Dispense: 180 tablet; Refill: 0  Multiple sclerosis (HCC) Assessment & Plan: Condition affecting weight management.  Stable on current regimen.  Topiramate  for neurological symptoms has helped with weight management.  We will be checking CMP and magnesium at the next office visit to monitor for medication side effects.   Class 3 severe obesity with serious comorbidity and body mass index (BMI) of 45.0 to 49.9 in adult Assessment & Plan: Patient has lost a total of 82 pounds which represents 26% of total body weight.  Pharmacotherapy include topiramate  and metformin .  She has good adherence to her reduced calorie nutrition plan and has good support from her mother.  She is also exercising.  She was congratulated on her efforts.  She also tends to retain fluid which affects her weight and was advised to use loop diuretic as needed.   Leg edema Assessment & Plan: She has pitting edema up to mid tibia.  She is on several medications that could cause leg swelling she also has a difficulty keeping her legs upright due to her MS she does not wear compression socks.  We reviewed skin care as well as the use of edema wear in the morning.  She has furosemide  in her possession was advised to take 1 tablet of 20 mg once a day for 3 days.  No congestive pulmonary symptoms or history of heart failure.   Photodermatitis due to sun Assessment & Plan: She has these hyperpigmented patches of brawny skin on her forearms and upper arms due to recent sun exposure.  We conveyed to her that she is on a few medications that may increase sensitivity to sunlight and therefore is  to avoid prolonged direct exposure was advised to use ultraviolet protection or sun wear.          Objective   Physical Exam:  Blood pressure 126/86, pulse 71, height 5\' 8"  (1.727 m), weight 229 lb (103.9 kg), SpO2 100%. Body mass index is 34.82 kg/m.  General: She is overweight, cooperative, alert, well developed, and in no acute distress. PSYCH: Has normal mood, affect and thought process.   HEENT: EOMI, sclerae are anicteric. Lungs: Normal breathing effort, no conversational dyspnea. Extremities: She has +2 edema up to mid tibia on both lower extremities.  No edema blisters or open wounds Neurologic: She is in a motorized wheelchair and has right-sided hemiparesis.  Diagnostic Data Reviewed:  BMET    Component Value Date/Time   NA 142 11/17/2023 0917   K 3.9 11/17/2023 0917   CL 108 (H) 11/17/2023 0917   CO2 19 (L) 11/17/2023 0917   GLUCOSE 76 11/17/2023 0917   GLUCOSE 85 06/23/2021 1136   BUN 12 11/17/2023 0917   CREATININE 0.81 11/17/2023 0917   CALCIUM 9.2 11/17/2023 0917   GFRNONAA 76 12/10/2020 1251   GFRAA 87 12/10/2020  1251   Lab Results  Component Value Date   HGBA1C 5.2 11/26/2023   HGBA1C 5.3 08/19/2022   Lab Results  Component Value Date   INSULIN  4.8 11/17/2023   INSULIN  25.0 (H) 02/23/2023   Lab Results  Component Value Date   TSH 1.690 02/23/2023   CBC    Component Value Date/Time   WBC 6.1 01/04/2024 1352   WBC 7.3 08/30/2014 1031   RBC 4.42 01/04/2024 1352   RBC 4.07 08/30/2014 1031   HGB 13.9 01/04/2024 1352   HCT 42.1 01/04/2024 1352   PLT 291 01/04/2024 1352   MCV 95 01/04/2024 1352   MCH 31.4 01/04/2024 1352   MCH 31.4 08/30/2014 1031   MCHC 33.0 01/04/2024 1352   MCHC 33.9 08/30/2014 1031   RDW 13.0 01/04/2024 1352   Iron Studies    Component Value Date/Time   IRON 68 10/27/2022 1056   TIBC 313 10/27/2022 1056   FERRITIN 41 10/27/2022 1056   IRONPCTSAT 22 10/27/2022 1056   Lipid Panel     Component Value  Date/Time   CHOL 149 02/23/2023 1305   TRIG 72 02/23/2023 1305   HDL 50 02/23/2023 1305   LDLCALC 85 02/23/2023 1305   Hepatic Function Panel     Component Value Date/Time   PROT 6.7 11/17/2023 0917   ALBUMIN 4.2 11/17/2023 0917   AST 9 11/17/2023 0917   ALT 8 11/17/2023 0917   ALKPHOS 80 11/17/2023 0917   BILITOT 0.5 11/17/2023 0917      Component Value Date/Time   TSH 1.690 02/23/2023 1305   Nutritional Lab Results  Component Value Date   VD25OH 55.6 02/23/2023   VD25OH 38.1 09/23/2021   VD25OH 34.1 12/22/2016    Medications: Outpatient Encounter Medications as of 04/27/2024  Medication Sig Note   ARIPiprazole  (ABILIFY ) 5 MG tablet Take 5 mg by mouth daily. 06/19/2021: Pt is taking this medication   baclofen  (LIORESAL ) 20 MG tablet Take 1 tablet (20 mg total) by mouth 3 (three) times daily.    cholecalciferol (VITAMIN D ) 1000 UNITS tablet Take 1,000 Units by mouth daily.    diphenhydrAMINE  (BENADRYL ) 25 mg capsule Take 25 mg by mouth every 6 (six) hours as needed for allergies.    DULoxetine  (CYMBALTA ) 30 MG capsule Take 1 capsule by mouth daily.    LORazepam  (ATIVAN ) 1 MG tablet Take 1 tablet (1 mg total) by mouth daily.    losartan  (COZAAR ) 50 MG tablet Take 1 tablet (50 mg total) by mouth daily.    Multiple Vitamins-Minerals (MULTIVITAMIN ADULT PO) Take 1 tablet by mouth daily.    nystatin  (MYCOSTATIN /NYSTOP ) powder Apply 1 Application topically 2 (two) times daily.    ocrelizumab  (OCREVUS ) 300 MG/10ML injection Infuse 600 mg IV Every 6 months 06/10/2021: 06/10/21 Medicare PArt D approved 12/07/20 - 06/10/2022.     oxybutynin  (DITROPAN -XL) 10 MG 24 hr tablet Take 1 tablet (10 mg total) by mouth daily as needed (to prevent incontinence).    pantoprazole  (PROTONIX ) 40 MG tablet Take 1 tablet (40 mg total) by mouth daily.    polyethylene glycol powder (GLYCOLAX /MIRALAX ) 17 GM/SCOOP powder Take 17 g by mouth 2 (two) times daily as needed.    Potassium (POTASSIMIN PO) Take by mouth  daily at 12 noon.    tiZANidine  (ZANAFLEX ) 4 MG tablet Take 1 tablet (4 mg total) by mouth 3 (three) times daily.    topiramate  (TOPAMAX ) 50 MG tablet Take 1 tablet (50 mg total) by mouth 2 (two) times daily.  UNABLE TO FIND     [DISCONTINUED] metFORMIN  (GLUCOPHAGE -XR) 500 MG 24 hr tablet Take 1 tablet (500 mg total) by mouth 2 (two) times daily with a meal.    metFORMIN  (GLUCOPHAGE -XR) 500 MG 24 hr tablet Take 1 tablet (500 mg total) by mouth 2 (two) times daily with a meal.    Facility-Administered Encounter Medications as of 04/27/2024  Medication   diphenhydrAMINE  (BENADRYL ) injection 50 mg   famotidine  (PEPCID ) 20 mg in sodium chloride  0.9 % 50 mL IVPB   methylPREDNISolone  sodium succinate (SOLU-MEDROL ) 125 mg in sodium chloride  0.9 % 50 mL IVPB   ocrelizumab  (OCREVUS ) 600 mg in sodium chloride  0.9 % 250 mL     Follow-Up   Return in about 4 weeks (around 05/25/2024) for For Weight Mangement with Dr. Allie Area.Aaron Aas She was informed of the importance of frequent follow up visits to maximize her success with intensive lifestyle modifications for her multiple health conditions.  Attestation Statement   Reviewed by clinician on day of visit: allergies, medications, problem list, medical history, surgical history, family history, social history, and previous encounter notes.     Ladd Picker, MD

## 2024-04-28 DIAGNOSIS — L568 Other specified acute skin changes due to ultraviolet radiation: Secondary | ICD-10-CM | POA: Insufficient documentation

## 2024-04-28 NOTE — Assessment & Plan Note (Signed)
 Patient has lost a total of 82 pounds which represents 26% of total body weight.  Pharmacotherapy include topiramate  and metformin .  She has good adherence to her reduced calorie nutrition plan and has good support from her mother.  She is also exercising.  She was congratulated on her efforts.  She also tends to retain fluid which affects her weight and was advised to use loop diuretic as needed.

## 2024-04-28 NOTE — Assessment & Plan Note (Signed)
 Condition affecting weight management.  Stable on current regimen.  Topiramate  for neurological symptoms has helped with weight management.  We will be checking CMP and magnesium at the next office visit to monitor for medication side effects.

## 2024-04-28 NOTE — Assessment & Plan Note (Signed)
 She has pitting edema up to mid tibia.  She is on several medications that could cause leg swelling she also has a difficulty keeping her legs upright due to her MS she does not wear compression socks.  We reviewed skin care as well as the use of edema wear in the morning.  She has furosemide  in her possession was advised to take 1 tablet of 20 mg once a day for 3 days.  No congestive pulmonary symptoms or history of heart failure.

## 2024-04-28 NOTE — Assessment & Plan Note (Signed)
 Her hemoglobin A1c is 5.2 her insulin  levels are now at 4.8 which are optimal and reduced from 25.  This is a significant improvement in insulin  resistance.  She is currently on metformin  XR twice daily without any adverse effect.  She will continue medication.  She also maintains a diet with a low glycemic load.  We will check hemoglobin A1c and B12 levels at the next office visit.

## 2024-04-28 NOTE — Assessment & Plan Note (Signed)
 Her blood pressure is well-controlled.  She is no longer requiring ARB.  Continue to monitor.  Check renal parameters at the next office visit

## 2024-04-28 NOTE — Assessment & Plan Note (Signed)
 She has these hyperpigmented patches of brawny skin on her forearms and upper arms due to recent sun exposure.  We conveyed to her that she is on a few medications that may increase sensitivity to sunlight and therefore is to avoid prolonged direct exposure was advised to use ultraviolet protection or sun wear.

## 2024-05-02 ENCOUNTER — Ambulatory Visit (INDEPENDENT_AMBULATORY_CARE_PROVIDER_SITE_OTHER): Admitting: Internal Medicine

## 2024-05-18 ENCOUNTER — Emergency Department (HOSPITAL_COMMUNITY)

## 2024-05-18 ENCOUNTER — Encounter: Payer: Self-pay | Admitting: Medical Oncology

## 2024-05-18 ENCOUNTER — Telehealth (HOSPITAL_COMMUNITY): Payer: Self-pay | Admitting: Pharmacy Technician

## 2024-05-18 ENCOUNTER — Other Ambulatory Visit: Payer: Self-pay

## 2024-05-18 ENCOUNTER — Other Ambulatory Visit (HOSPITAL_COMMUNITY): Payer: Self-pay

## 2024-05-18 ENCOUNTER — Emergency Department (HOSPITAL_COMMUNITY)
Admission: EM | Admit: 2024-05-18 | Discharge: 2024-05-18 | Disposition: A | Discharge status: Home / Self Care | Attending: Emergency Medicine | Admitting: Emergency Medicine

## 2024-05-18 ENCOUNTER — Emergency Department (HOSPITAL_BASED_OUTPATIENT_CLINIC_OR_DEPARTMENT_OTHER)

## 2024-05-18 DIAGNOSIS — R231 Pallor: Secondary | ICD-10-CM | POA: Diagnosis not present

## 2024-05-18 DIAGNOSIS — R0602 Shortness of breath: Secondary | ICD-10-CM | POA: Diagnosis present

## 2024-05-18 DIAGNOSIS — I10 Essential (primary) hypertension: Secondary | ICD-10-CM | POA: Diagnosis not present

## 2024-05-18 DIAGNOSIS — R42 Dizziness and giddiness: Secondary | ICD-10-CM | POA: Diagnosis not present

## 2024-05-18 DIAGNOSIS — Z743 Need for continuous supervision: Secondary | ICD-10-CM | POA: Diagnosis not present

## 2024-05-18 DIAGNOSIS — R0789 Other chest pain: Secondary | ICD-10-CM | POA: Diagnosis not present

## 2024-05-18 DIAGNOSIS — R918 Other nonspecific abnormal finding of lung field: Secondary | ICD-10-CM | POA: Diagnosis not present

## 2024-05-18 DIAGNOSIS — G8190 Hemiplegia, unspecified affecting unspecified side: Secondary | ICD-10-CM | POA: Diagnosis not present

## 2024-05-18 DIAGNOSIS — J189 Pneumonia, unspecified organism: Secondary | ICD-10-CM | POA: Diagnosis not present

## 2024-05-18 DIAGNOSIS — R079 Chest pain, unspecified: Secondary | ICD-10-CM | POA: Diagnosis not present

## 2024-05-18 DIAGNOSIS — J90 Pleural effusion, not elsewhere classified: Secondary | ICD-10-CM | POA: Diagnosis not present

## 2024-05-18 DIAGNOSIS — I2699 Other pulmonary embolism without acute cor pulmonale: Secondary | ICD-10-CM

## 2024-05-18 DIAGNOSIS — K869 Disease of pancreas, unspecified: Secondary | ICD-10-CM | POA: Diagnosis not present

## 2024-05-18 DIAGNOSIS — R609 Edema, unspecified: Secondary | ICD-10-CM | POA: Diagnosis not present

## 2024-05-18 DIAGNOSIS — Z7401 Bed confinement status: Secondary | ICD-10-CM | POA: Diagnosis not present

## 2024-05-18 LAB — COMPREHENSIVE METABOLIC PANEL WITH GFR
ALT: 5 U/L (ref 0–44)
AST: 14 U/L — ABNORMAL LOW (ref 15–41)
Albumin: 3.3 g/dL — ABNORMAL LOW (ref 3.5–5.0)
Alkaline Phosphatase: 52 U/L (ref 38–126)
Anion gap: 10 (ref 5–15)
BUN: 17 mg/dL (ref 6–20)
CO2: 16 mmol/L — ABNORMAL LOW (ref 22–32)
Calcium: 8.6 mg/dL — ABNORMAL LOW (ref 8.9–10.3)
Chloride: 112 mmol/L — ABNORMAL HIGH (ref 98–111)
Creatinine, Ser: 0.71 mg/dL (ref 0.44–1.00)
GFR, Estimated: >60 mL/min (ref >60)
Glucose, Bld: 84 mg/dL (ref 70–99)
Potassium: 4.1 mmol/L (ref 3.5–5.1)
Sodium: 138 mmol/L (ref 135–145)
Total Bilirubin: 0.9 mg/dL (ref 0.0–1.2)
Total Protein: 5.9 g/dL — ABNORMAL LOW (ref 6.5–8.1)

## 2024-05-18 LAB — CBC WITH DIFFERENTIAL/PLATELET
Abs Immature Granulocytes: 0.01 10*3/uL (ref 0.00–0.07)
Basophils Absolute: 0.1 10*3/uL (ref 0.0–0.1)
Basophils Relative: 1 %
Eosinophils Absolute: 0.2 10*3/uL (ref 0.0–0.5)
Eosinophils Relative: 3 %
HCT: 39.1 % (ref 36.0–46.0)
Hemoglobin: 12.3 g/dL (ref 12.0–15.0)
Immature Granulocytes: 0 %
Lymphocytes Relative: 32 %
Lymphs Abs: 1.7 10*3/uL (ref 0.7–4.0)
MCH: 30.8 pg (ref 26.0–34.0)
MCHC: 31.5 g/dL (ref 30.0–36.0)
MCV: 98 fL (ref 80.0–100.0)
Monocytes Absolute: 0.5 10*3/uL (ref 0.1–1.0)
Monocytes Relative: 10 %
Neutro Abs: 3 10*3/uL (ref 1.7–7.7)
Neutrophils Relative %: 54 %
Platelets: 231 10*3/uL (ref 150–400)
RBC: 3.99 MIL/uL (ref 3.87–5.11)
RDW: 13.8 % (ref 11.5–15.5)
WBC: 5.4 10*3/uL (ref 4.0–10.5)
nRBC: 0 % (ref 0.0–0.2)

## 2024-05-18 LAB — TROPONIN I (HIGH SENSITIVITY)
Troponin I (High Sensitivity): <2 ng/L (ref <18)
Troponin I (High Sensitivity): <2 ng/L (ref <18)

## 2024-05-18 LAB — BRAIN NATRIURETIC PEPTIDE: B Natriuretic Peptide: 17.4 pg/mL (ref 0.0–100.0)

## 2024-05-18 LAB — D-DIMER, QUANTITATIVE: D-Dimer, Quant: 1.06 ug{FEU}/mL — ABNORMAL HIGH (ref 0.00–0.50)

## 2024-05-18 LAB — LIPASE, BLOOD: Lipase: 30 U/L (ref 11–51)

## 2024-05-18 MED ORDER — ALUM & MAG HYDROXIDE-SIMETH 200-200-20 MG/5ML PO SUSP
30.0000 mL | Freq: Once | ORAL | Status: AC
  Administered 2024-05-18: 30 mL via ORAL
  Filled 2024-05-18: qty 30

## 2024-05-18 MED ORDER — SODIUM CHLORIDE 0.9 % IV BOLUS
500.0000 mL | Freq: Once | INTRAVENOUS | Status: AC
  Administered 2024-05-18: 500 mL via INTRAVENOUS

## 2024-05-18 MED ORDER — APIXABAN 5 MG PO TABS
10.0000 mg | ORAL_TABLET | Freq: Two times a day (BID) | ORAL | Status: DC
  Administered 2024-05-18: 10 mg via ORAL
  Filled 2024-05-18: qty 2

## 2024-05-18 MED ORDER — APIXABAN 5 MG PO TABS: 10.0000 mg | ORAL_TABLET | Freq: Two times a day (BID) | ORAL | Status: DC

## 2024-05-18 MED ORDER — IOHEXOL 350 MG/ML SOLN
75.0000 mL | Freq: Once | INTRAVENOUS | Status: AC | PRN
  Administered 2024-05-18: 75 mL via INTRAVENOUS

## 2024-05-18 MED ORDER — ELIQUIS DVT/PE STARTER PACK 5 MG PO TBPK
ORAL_TABLET | ORAL | 0 refills | Status: DC
  Filled 2024-05-18: qty 74, 30d supply, fill #0

## 2024-05-18 MED ORDER — APIXABAN 5 MG PO TABS: 5.0000 mg | ORAL_TABLET | Freq: Two times a day (BID) | ORAL | Status: DC

## 2024-05-18 NOTE — Progress Notes (Signed)
 error

## 2024-05-18 NOTE — ED Provider Notes (Signed)
 Emergency Department Provider Note   I have reviewed the triage vital signs and the nursing notes.   HISTORY  Chief Complaint Chest Pain   HPI Ebony Warren is a 48 y.o. female with PMH of MS, HTN, GERD, and OA presents to the ED with sharp CP. Symptoms began last night before bed.  She describes a central, sharp, slightly left-sided chest pain without shortness of breath.  No nausea or vomiting.  No abdominal pain.  She took a Protonix , thinking this might be heartburn and symptoms resolved enough for her to get the sleep.  She woke at 3 AM with return of the sharp pain and presented to the ED.  EMS arrived on scene and administered 324 mg of aspirin.  No other medication prior to arrival.  Pain has decreased now to 4 out of 10 in severity.  Past Medical History:  Diagnosis Date   ADD (attention deficit disorder)    Anxiety and depression    Arthritis    Bradycardia    Constipation    Edema of both lower extremities    Encounter to establish care 01/22/2021   Family history of adverse reaction to anesthesia    nausea   Food allergy    Gallbladder problem    GERD (gastroesophageal reflux disease)    Headache    Hypertension    Multiple sclerosis (HCC)    Obesity    Osteoarthritis    Screening mammogram, encounter for 01/22/2021   Seizures (HCC)    told that they are not from the brain but stress related   Swelling of lower extremity 03/24/2023   Vitamin D  deficiency     Review of Systems  Constitutional: No fever/chills Cardiovascular: Positive chest pain. Respiratory: Denies shortness of breath. Gastrointestinal: Positive epigastric abdominal pain. Positive nausea, no vomiting.  Skin: Negative for rash. Neurological: Negative for headaches.  ____________________________________________   PHYSICAL EXAM:  VITAL SIGNS: ED Triage Vitals  Encounter Vitals Group     BP 05/18/24 0415 110/77     Pulse Rate 05/18/24 0415 (!) 57     Resp 05/18/24  0415 17     Temp 05/18/24 0430 97.9 F (36.6 C)     Temp Source 05/18/24 0430 Oral     SpO2 05/18/24 0415 100 %     Weight 05/18/24 0411 229 lb (103.9 kg)     Height 05/18/24 0411 5' 8 (1.727 m)   Constitutional: Alert and oriented. Well appearing and in no acute distress. Eyes: Conjunctivae are normal.  Head: Atraumatic. Nose: No congestion/rhinnorhea. Mouth/Throat: Mucous membranes are moist.  Neck: No stridor.   Cardiovascular: Normal rate, regular rhythm. Good peripheral circulation. Grossly normal heart sounds.   Respiratory: Normal respiratory effort.  No retractions. Lungs CTAB. Gastrointestinal: Soft and nontender. No distention.  Musculoskeletal: No lower extremity tenderness nor edema. No gross deformities of extremities. Neurologic:  Normal speech and language.  Skin:  Skin is warm, dry and intact. No rash noted.  ____________________________________________   LABS (all labs ordered are listed, but only abnormal results are displayed)  Labs Reviewed  COMPREHENSIVE METABOLIC PANEL WITH GFR - Abnormal; Notable for the following components:      Result Value   Chloride 112 (*)    CO2 16 (*)    Calcium 8.6 (*)    Total Protein 5.9 (*)    Albumin 3.3 (*)    AST 14 (*)    All other components within normal limits  D-DIMER, QUANTITATIVE - Abnormal; Notable  for the following components:   D-Dimer, Quant 1.06 (*)    All other components within normal limits  LIPASE, BLOOD  CBC WITH DIFFERENTIAL/PLATELET  BRAIN NATRIURETIC PEPTIDE  TROPONIN I (HIGH SENSITIVITY)  TROPONIN I (HIGH SENSITIVITY)   ____________________________________________  EKG   EKG Interpretation Date/Time:  Thursday May 18 2024 04:13:15 EDT Ventricular Rate:  57 PR Interval:  195 QRS Duration:  95 QT Interval:  409 QTC Calculation: 399 R Axis:   82  Text Interpretation: Sinus rhythm Low voltage, precordial leads Borderline T abnormalities, diffuse leads No STEMI Confirmed by Abby Hocking  [phone]) on 05/18/2024 4:15:31 AM        ____________________________________________  RADIOLOGY  VAS US  LOWER EXTREMITY VENOUS (DVT) (ONLY [facility] & [facility]) Result Date: 05/18/2024  Lower Venous DVT Study Patient Name:  Ebony Warren  Date of Exam:   05/18/2024 Medical Rec #: [id number]                    Accession #:    [id number] Date of Birth: 01/13/1976                    Patient Gender: F Patient Age:   22 years Exam Location:  [facility] Procedure:      VAS US  LOWER EXTREMITY VENOUS (DVT) Referring Phys: Debria Fang TEE --------------------------------------------------------------------------------  Indications: Pulmonary embolism.  Risk Factors: Multiple sclerosis, hemiparesis of the right side. Limitations: Poor ultrasound/tissue interface and right calf. Comparison Study: 07/27/2018 - Negative Performing Technologist: Franky Ivanoff Sturdivant-Jones RDMS, RVT  Examination Guidelines: A complete evaluation includes B-mode imaging, spectral Doppler, color Doppler, and power Doppler as needed of all accessible portions of each vessel. Bilateral testing is considered an integral part of a complete examination. Limited examinations for reoccurring indications may be performed as noted. The reflux portion of the exam is performed with the patient in reverse Trendelenburg.  +---------+---------------+---------+-----------+----------+------------------+ RIGHT    CompressibilityPhasicitySpontaneityPropertiesThrombus Aging     +---------+---------------+---------+-----------+----------+------------------+ CFV      Full           Yes      Yes                                     +---------+---------------+---------+-----------+----------+------------------+ SFJ      Full                                                            +---------+---------------+---------+-----------+----------+------------------+ FV Prox  Full                                                             +---------+---------------+---------+-----------+----------+------------------+ FV Mid   Full                                                            +---------+---------------+---------+-----------+----------+------------------+ FV DistalFull                                                            +---------+---------------+---------+-----------+----------+------------------+  PFV      Full                                                            +---------+---------------+---------+-----------+----------+------------------+ POP      Full           Yes      Yes                                     +---------+---------------+---------+-----------+----------+------------------+ PTV      Full                                                            +---------+---------------+---------+-----------+----------+------------------+ PERO                                                  not clearly                                                              visualized         +---------+---------------+---------+-----------+----------+------------------+   +---------+---------------+---------+-----------+----------+--------------+ LEFT     CompressibilityPhasicitySpontaneityPropertiesThrombus Aging +---------+---------------+---------+-----------+----------+--------------+ CFV      Full           Yes      Yes                                 +---------+---------------+---------+-----------+----------+--------------+ SFJ      Full                                                        +---------+---------------+---------+-----------+----------+--------------+ FV Prox  Full                                                        +---------+---------------+---------+-----------+----------+--------------+ FV Mid   Full                                                         +---------+---------------+---------+-----------+----------+--------------+ FV DistalFull                                                        +---------+---------------+---------+-----------+----------+--------------+  PFV      Full                                                        +---------+---------------+---------+-----------+----------+--------------+ POP      Full           Yes      Yes                                 +---------+---------------+---------+-----------+----------+--------------+ PTV      Full                                                        +---------+---------------+---------+-----------+----------+--------------+ PERO     Full                                                        +---------+---------------+---------+-----------+----------+--------------+    Summary: RIGHT: - There is no evidence of deep vein thrombosis in the lower extremity. However, portions of this examination were limited- see technologist comments above.  - No cystic structure found in the popliteal fossa.  LEFT: - There is no evidence of deep vein thrombosis in the lower extremity.  - No cystic structure found in the popliteal fossa.  *See table(s) above for measurements and observations. Electronically signed by Jimmye Moulds MD on 05/18/2024 at 10:22:51 AM.    Final    CT Angio Chest PE W and/or Wo Contrast Result Date: 05/18/2024 CLINICAL DATA:  Chest pain. EXAM: CT ANGIOGRAPHY CHEST WITH CONTRAST TECHNIQUE: Multidetector CT imaging of the chest was performed using the standard protocol during bolus administration of intravenous contrast. Multiplanar CT image reconstructions and MIPs were obtained to evaluate the vascular anatomy. RADIATION DOSE REDUCTION: This exam was performed according to the departmental dose-optimization program which includes automated exposure control, adjustment of the mA and/or kV according to patient size and/or use of iterative  reconstruction technique. CONTRAST:  75mL OMNIPAQUE  IOHEXOL  350 MG/ML SOLN COMPARISON:  None Available. FINDINGS: Cardiovascular: The heart size is upper normal to borderline enlarged. No substantial pericardial effusion. Small volume segmental and subsegmental pulmonary embolus identified right lower lobe (well seen image 145/6). No evidence for right heart strain. Mediastinum/Nodes: No mediastinal lymphadenopathy. There is no hilar lymphadenopathy. The esophagus has normal imaging features. There is no axillary lymphadenopathy. Lungs/Pleura: The lungs are clear without focal pneumonia, edema, pneumothorax or pleural effusion. 4 mm anterior left lung nodule identified on 61/7 dependent atelectasis noted in the lung bases. No overt pulmonary infarct. Upper Abdomen: 1.9 cm hypoattenuating lesion is identified in the tail the pancreas. Musculoskeletal: No worrisome lytic or sclerotic osseous abnormality. Healed posterior right rib fractures Review of the MIP images confirms the above findings. IMPRESSION: 1. Small volume segmental and subsegmental pulmonary embolus in the right lower lobe. No evidence for right heart strain or overt pulmonary infarct. 2. 1.9 cm hypoattenuating lesion in the tail the pancreas. MRI abdomen with and without contrast  recommended to further evaluate. 3. 4 mm anterior left lung nodule. No follow-up needed if patient is low-risk.This recommendation follows the consensus statement: Guidelines for Management of Incidental Pulmonary Nodules Detected on CT Images: From the Fleischner Society 2017; Radiology 2017; 284:228-243. Critical Value/emergent results were called by telephone at the time of interpretation on 05/18/2024 at 7:50 am to the ER provider taking over care of Dr. Arville Bis patient's, and he verbally acknowledged these results. Electronically Signed   By: Donnal Fusi M.D.   On: 05/18/2024 07:50   DG Chest 2 View Result Date: 05/18/2024 EXAM: 2 VIEW(S) XRAY OF THE CHEST 05/18/2024  05:05:00 AM COMPARISON: None available. CLINICAL HISTORY: CP. Pt BIB EMS from home. C/o sharp non radiating 8/10 CP that started around 9pm after eating. Pain decreased to 4/10 about 1 hr ago. Denies SHOB, N/V. Hx ms, non wt bearing at baseline. FINDINGS: LUNGS AND PLEURA: Lateral left lower lobe pneumonia and small left pleural effusion are present. HEART AND MEDIASTINUM: No acute abnormality of the cardiac and mediastinal silhouettes. BONES AND SOFT TISSUES: No acute osseous abnormality. IMPRESSION: 1. Lateral left lower lobe pneumonia and small left pleural effusion. Electronically signed by: Audree Leas MD 05/18/2024 05:09 AM EDT RP Workstation: [id number]    ____________________________________________   PROCEDURES  Procedure(s) performed:   Procedures  None ____________________________________________   INITIAL IMPRESSION / ASSESSMENT AND PLAN / ED COURSE  Pertinent labs & imaging results that were available during my care of the patient were reviewed by me and considered in my medical decision making (see chart for details).   This patient is Presenting for Evaluation of CP, which does require a range of treatment options, and is a complaint that involves a high risk of morbidity and mortality.  The Differential Diagnoses includes but is not exclusive to acute coronary syndrome, aortic dissection, pulmonary embolism, cardiac tamponade, community-acquired pneumonia, pericarditis, musculoskeletal chest wall pain, etc.   Critical Interventions-    Medications  sodium chloride  0.9 % bolus 500 mL (0 mLs Intravenous Stopped 05/18/24 0611)  alum & mag hydroxide-simeth (MAALOX/MYLANTA) 200-200-20 MG/5ML suspension 30 mL (30 mLs Oral Given 05/18/24 0518)  iohexol  (OMNIPAQUE ) 350 MG/ML injection 75 mL (75 mLs Intravenous Contrast Given 05/18/24 0734)    Reassessment after intervention: pain improved.    Clinical Laboratory Tests Ordered, included CBC without anemia or  leukocytosis. Troponin negative. No AKI.   Radiologic Tests Ordered, included CXR. I independently interpreted the images and agree with radiology interpretation.   Cardiac Monitor Tracing which shows NSR.    Social Determinants of Health Risk patient is a non-smoker.   Medical Decision Making: Summary:  Patient presents to the ED with CP. Fairly atypical for ACS but considered. PE is also a consideration but vitals are reassuring. Will add d d imer. Patient is largely immobile increasing risk of VTE.   Reevaluation with update and discussion with patient. CTA pending. Care transferred to Dr. Charlee Conine pending CTA.   Considered admission but workup is in process.   Patient's presentation is most consistent with acute presentation with potential threat to life or bodily function.   Disposition: pending  ____________________________________________  FINAL CLINICAL IMPRESSION(S) / ED DIAGNOSES  Final diagnoses:  Other acute pulmonary embolism, unspecified whether acute cor pulmonale present (HCC)  Pancreatic lesion     NEW OUTPATIENT MEDICATIONS STARTED DURING THIS VISIT:  Discharge Medication List as of 05/18/2024  3:41 PM     START taking these medications   Details  Apixaban  Starter Pack, 10mg  and  5mg , (ELIQUIS  DVT/PE STARTER PACK) Take 2 tablets (10mg ) by mouth twice daily for 7 days, then 1 tablet (5mg ) twice daily, Normal        Note:  This document was prepared using Dragon voice recognition software and may include unintentional dictation errors.  Abby Hocking, MD, [facility]    Gwen Sarvis, Shereen Dike, MD 05/19/24 [phone]

## 2024-05-18 NOTE — ED Notes (Signed)
RN called [facility].  

## 2024-05-18 NOTE — ED Provider Notes (Signed)
 I received the patient in signout.  This is a 48 year old female who is bedbound due to MS is presenting with chest pain.  Plan is to follow-up on CT angiogram and reassess for ultimate disposition.  Physical Exam  BP 121/78   Pulse 60   Temp 97.9 F (36.6 C) (Oral)   Resp 14   Ht 5' 8 (1.727 m)   Wt 103.9 kg   SpO2 100%   BMI 34.82 kg/m   Physical Exam General: No acute distress  Procedures  Procedures  ED Course / MDM    Medical Decision Making Amount and/or Complexity of Data Reviewed Labs: ordered. Radiology: ordered.  Risk OTC drugs. Prescription drug management.   The patient's CT angiogram did show a segmental pulmonary embolism.  There is also a suspicious mass on her pancreas.  MRI is recommended. I did cover the patient with Eliquis.  Will discuss her case with pulmonology after her ultrasound result to determine ultimate disposition.  Her troponin is negative and there are no signs of endorgan dysfunction.  She may be a candidate for outpatient treatment.  Will discuss after workup is complete for ultimate disposition.  I discussed the patient's case with Dr.Iruku.  Her troponins and BNP are negative.  There are no signs of heart strain and the patient is satting 100% on room air.  This was discussed with heme-onc and we will discharge the patient with Eliquis.  They will help schedule the patient for follow-up in clinic for MRI for the pancreatic lesion.  She is discharged with return precautions.       Carin Charleston, MD 05/18/24 [phone]

## 2024-05-18 NOTE — Discharge Instructions (Addendum)
 Your CT scan shows a blood clot.  Please take the blood thinner as prescribed.  Please call the number provided and schedule an appointment with the diagnostic clinic.  I spoke with one of our oncologist who has reached out to their office to let them know to expect you.  They will get your MRI scheduled.  Please return to the emergency department for worsening symptoms.  Information on my medicine - ELIQUIS (apixaban)  This medication education was reviewed with me or my healthcare representative as part of my discharge preparation.  Why was Eliquis prescribed for you? Eliquis was prescribed to treat blood clots that may have been found in the veins of your legs (deep vein thrombosis) or in your lungs (pulmonary embolism) and to reduce the risk of them occurring again.  What do You need to know about Eliquis ? The starting dose is 10 mg (two 5 mg tablets) taken TWICE daily for the FIRST SEVEN (7) DAYS, then on (enter date)  05/25/2024  the dose is reduced to ONE 5 mg tablet taken TWICE daily.  Eliquis may be taken with or without food.   Try to take the dose about the same time in the morning and in the evening. If you have difficulty swallowing the tablet whole please discuss with your pharmacist how to take the medication safely.  Take Eliquis exactly as prescribed and DO NOT stop taking Eliquis without talking to the doctor who prescribed the medication.  Stopping may increase your risk of developing a new blood clot.  Refill your prescription before you run out.  After discharge, you should have regular check-up appointments with your healthcare provider that is prescribing your Eliquis.    What do you do if you miss a dose? If a dose of ELIQUIS is not taken at the scheduled time, take it as soon as possible on the same day and twice-daily administration should be resumed. The dose should not be doubled to make up for a missed dose.  Important Safety Information A possible side  effect of Eliquis is bleeding. You should call your healthcare provider right away if you experience any of the following: Bleeding from an injury or your nose that does not stop. Unusual colored urine (red or dark brown) or unusual colored stools (red or black). Unusual bruising for unknown reasons. A serious fall or if you hit your head (even if there is no bleeding).  Some medicines may interact with Eliquis and might increase your risk of bleeding or clotting while on Eliquis. To help avoid this, consult your healthcare provider or pharmacist prior to using any new prescription or non-prescription medications, including herbals, vitamins, non-steroidal anti-inflammatory drugs (NSAIDs) and supplements.  This website has more information on Eliquis (apixaban): http://www.eliquis.com/eliquis/home

## 2024-05-18 NOTE — Progress Notes (Signed)
 Rapid Diagnostic Clinic [facility] [phone]   [phone]  INITIAL CONSULTATION:  Patient Care Team: Lovie Clarity, MD as PCP - General (Internal Medicine) Golden Forestine BROCKS, RN as Oncology Nurse Navigator (Medical Oncology)  CHIEF COMPLAINTS/PURPOSE OF CONSULTATION:  Pancreatic mass  HISTORY OF PRESENTING ILLNESS:  Ebony Warren 48 y.o. female with medical history significant for multiple sclerosis, ADD, anxiety, depression, GERD, obesity, hypertension, seizures and vitamin D  deficiency presents to the rapid diagnostic clinic for evaluation of pancreatic mass seen on recent CT imaging.  On review of the previous records, Ms. Hlavaty presented to ER on 05/18/2024 with central and left sided chest pain. CTA chest was obtained that demostrated a small volume segmental and subsegmental pulmonary embolus in the right lower lobe. There was an incidental finding of a 1.9 cm hypoattenuating lesion in the tail of the pancreas and 4 mm anterior left lung nodule.   On exam today, Ms. Warren reports she reports since experiencing some chest pain after diagnosis of PE rating it 3 out of 10 on a pain scale. She is compliant with taking Eliquis  as prescribed. She denies any bruising or bleeding episodes. She reports her energy and appetite are overall stable. She denies nausea, vomiting or abdominal pain. She has constipation which she takes miralax  every other day. She denies fevers, chills, sweats, shortness of breath, cough or headaches. She has no other complaints. Rest of the ROS is below.    MEDICAL HISTORY:  Past Medical History:  Diagnosis Date   ADD (attention deficit disorder)    Anxiety and depression    Arthritis    Bradycardia    Constipation    Edema of both lower extremities    Encounter to establish care 01/22/2021   Family history of adverse reaction to anesthesia    nausea   Food allergy    Gallbladder  problem    GERD (gastroesophageal reflux disease)    Headache    Hypertension    Multiple sclerosis (HCC)    Obesity    Osteoarthritis    Screening mammogram, encounter for 01/22/2021   Seizures (HCC)    told that they are not from the brain but stress related   Swelling of lower extremity 03/24/2023   Vitamin D  deficiency     SURGICAL HISTORY: Past Surgical History:  Procedure Laterality Date   BREAST REDUCTION SURGERY Bilateral 06/23/2021   Procedure: MAMMARY REDUCTION  (BREAST);  Surgeon: Elisabeth Craig RAMAN, MD;  Location: [facility] OR;  Service: Plastics;  Laterality: Bilateral;   CHOLECYSTECTOMY     KNEE SURGERY Bilateral    Arthroscopy    SOCIAL HISTORY: Social History   Socioeconomic History   Marital status: Single    Spouse name: Not on file   Number of children: 1   Years of education: College   Highest education level: Not on file  Occupational History    Employer: OTHER    Comment: disabled  Tobacco Use   Smoking status: Former    Current packs/day: 0.00    Average packs/day: 0.4 packs/day for 3.0 years (1.1 ttl pk-yrs)    Types: Cigarettes    Start date: 12/08/2003    Quit date: 12/07/2006    Years since quitting: 17.4   Smokeless tobacco: Never  Vaping Use   Vaping status: Never Used  Substance and Sexual Activity   Alcohol use: No    Alcohol/week: 0.0 standard drinks of alcohol    Comment: quit: 2013 (socially)   Drug use:  Not Currently    Types: Marijuana    Comment: Smokes approx 1-2 per day   Sexual activity: Not Currently  Other Topics Concern   Not on file  Social History Narrative   ** Merged History Encounter **       Patient lives at home with family. Caffeine Use: drinks a cup of hot tea with tecfidera  in the am Patient is right handed. Patient has a college education   Social Drivers of Corporate investment banker Strain: Patient Declined (05/05/2023)   Overall Financial Resource Strain (CARDIA)    Difficulty of Paying Living Expenses:  Patient declined  Food Insecurity: No Food Insecurity (05/05/2023)   Hunger Vital Sign    Worried About Running Out of Food in the Last Year: Never true    Ran Out of Food in the Last Year: Never true  Transportation Needs: Unmet Transportation Needs (05/05/2023)   PRAPARE - Transportation    Lack of Transportation (Medical): Yes    Lack of Transportation (Non-Medical): Yes  Physical Activity: Insufficiently Active (05/05/2023)   Exercise Vital Sign    Days of Exercise per Week: 7 days    Minutes of Exercise per Session: 20 min  Stress: No Stress Concern Present (05/05/2023)   [employer] of Occupational Health - Occupational Stress Questionnaire    Feeling of Stress : Not at all  Social Connections: Socially Integrated (05/05/2023)   Social Connection and Isolation Panel    Frequency of Communication with Friends and Family: More than three times a week    Frequency of Social Gatherings with Friends and Family: More than three times a week    Attends Religious Services: More than 4 times per year    Active Member of [employer] or Organizations: Yes    Attends Engineer, structural: More than 4 times per year    Marital Status: Married  Catering manager Violence: Not At Risk (05/05/2023)   Humiliation, Afraid, Rape, and Kick questionnaire    Fear of Current or Ex-Partner: No    Emotionally Abused: No    Physically Abused: No    Sexually Abused: No    FAMILY HISTORY: Family History  Problem Relation Age of Onset   Hypertension Mother    Atrial fibrillation Mother    Thyroid  disease Mother    Cancer Mother    Obesity Mother    Colon polyps Mother    Hypertension Father    Atrial fibrillation Maternal Grandmother     ALLERGIES:  is allergic to bioflavonoids, oxycodone -acetaminophen , hydrocodone , morphine , orange fruit [citrus], oxycodone , tomato, and other.  MEDICATIONS:  Current Outpatient Medications  Medication Sig Dispense Refill   Apixaban  Starter Pack, 10mg  and  5mg , (ELIQUIS  DVT/PE STARTER PACK) Take 2 tablets (10mg ) by mouth twice daily for 7 days, then 1 tablet (5mg ) twice daily 74 tablet 0   ARIPiprazole  (ABILIFY ) 5 MG tablet Take 5 mg by mouth daily.     baclofen  (LIORESAL ) 20 MG tablet Take 1 tablet (20 mg total) by mouth 3 (three) times daily. 270 tablet 4   cholecalciferol (VITAMIN D ) 1000 UNITS tablet Take 1,000 Units by mouth daily.     diphenhydrAMINE  (BENADRYL ) 25 mg capsule Take 25 mg by mouth every 6 (six) hours as needed for allergies.     DULoxetine  (CYMBALTA ) 30 MG capsule Take 1 capsule by mouth daily.     LORazepam  (ATIVAN ) 1 MG tablet Take 1 tablet (1 mg total) by mouth daily. 14 tablet 0   losartan  (COZAAR )  50 MG tablet Take 1 tablet (50 mg total) by mouth daily. 90 tablet 3   metFORMIN  (GLUCOPHAGE -XR) 500 MG 24 hr tablet Take 1 tablet (500 mg total) by mouth 2 (two) times daily with a meal. 180 tablet 0   Multiple Vitamins-Minerals (MULTIVITAMIN ADULT PO) Take 1 tablet by mouth daily.     nystatin  (MYCOSTATIN /NYSTOP ) powder Apply 1 Application topically 2 (two) times daily. 60 g 1   ocrelizumab  (OCREVUS ) 300 MG/10ML injection Infuse 600 mg IV Every 6 months 20 mL 1   oxybutynin  (DITROPAN -XL) 10 MG 24 hr tablet Take 1 tablet (10 mg total) by mouth daily as needed (to prevent incontinence). 90 tablet 3   pantoprazole  (PROTONIX ) 40 MG tablet Take 1 tablet (40 mg total) by mouth daily. 90 tablet 3   polyethylene glycol powder (GLYCOLAX /MIRALAX ) 17 GM/SCOOP powder Take 17 g by mouth 2 (two) times daily as needed. 765 g 1   Potassium (POTASSIMIN PO) Take by mouth daily at 12 noon.     tiZANidine  (ZANAFLEX ) 4 MG tablet Take 1 tablet (4 mg total) by mouth 3 (three) times daily. 270 tablet 4   topiramate  (TOPAMAX ) 50 MG tablet Take 1 tablet (50 mg total) by mouth 2 (two) times daily. 180 tablet 4   Current Facility-Administered Medications  Medication Dose Route Frequency Provider Last Rate Last Admin   diphenhydrAMINE  (BENADRYL ) injection  50 mg  50 mg Intravenous Once Penumalli, Vikram R, MD       famotidine  (PEPCID ) 20 mg in sodium chloride  0.9 % 50 mL IVPB  20 mg Intravenous Once Penumalli, Vikram R, MD       methylPREDNISolone  sodium succinate (SOLU-MEDROL ) 125 mg in sodium chloride  0.9 % 50 mL IVPB  125 mg Intravenous Once Penumalli, Vikram R, MD       ocrelizumab  (OCREVUS ) 600 mg in sodium chloride  0.9 % 250 mL  600 mg Intravenous Once Penumalli, Vikram R, MD       Facility-Administered Medications Ordered in Other Visits  Medication Dose Route Frequency Provider Last Rate Last Admin   apixaban  (ELIQUIS ) tablet 10 mg  10 mg Oral BID Ula Prentice SAUNDERS, MD   10 mg at 05/18/24 9075   Followed by   NOREEN ON 05/25/2024] apixaban  (ELIQUIS ) tablet 5 mg  5 mg Oral BID Ula Prentice SAUNDERS, MD        REVIEW OF SYSTEMS:   Constitutional: ( - ) fevers, ( - )  chills , ( - ) night sweats Eyes: ( - ) blurriness of vision, ( - ) double vision, ( - ) watery eyes Ears, nose, mouth, throat, and face: ( - ) mucositis, ( - ) sore throat Respiratory: ( - ) cough, ( - ) dyspnea, ( - ) wheezes Cardiovascular: ( - ) palpitation, ( - ) chest discomfort, ( - ) lower extremity swelling Gastrointestinal:  ( - ) nausea, ( - ) heartburn, ( - ) change in bowel habits Skin: ( - ) abnormal skin rashes Lymphatics: ( - ) new lymphadenopathy, ( - ) easy bruising Neurological: ( - ) numbness, ( - ) tingling, ( - ) new weaknesses Behavioral/Psych: ( - ) mood change, ( - ) new changes  All other systems were reviewed with the patient and are negative.  PHYSICAL EXAMINATION: ECOG PERFORMANCE STATUS: 1 - Symptomatic but completely ambulatory  There were no vitals filed for this visit. There were no vitals filed for this visit.  GENERAL: well appearing female in NAD  SKIN: skin color, texture,  turgor are normal, no rashes or significant lesions EYES: conjunctiva are pink and non-injected, sclera clear OROPHARYNX: no exudate, no erythema; lips, buccal mucosa, and  tongue normal  NECK: supple, non-tender LYMPH:  no palpable lymphadenopathy in the cervica or supraclavicular lymph nodes.  LUNGS: clear to auscultation and percussion with normal breathing effort HEART: regular rate & rhythm and no murmurs and no lower extremity edema ABDOMEN: soft, non-tender, non-distended, normal bowel sounds Musculoskeletal: no cyanosis of digits and no clubbing  PSYCH: alert & oriented x 3, fluent speech NEURO: no focal motor/sensory deficits  LABORATORY DATA:  I have reviewed the data as listed    Latest Ref Rng & Units 05/18/2024    4:36 AM 01/04/2024    1:52 PM 10/18/2023    2:34 PM  CBC  WBC 4.0 - 10.5 K/uL 5.4  6.1  7.6   Hemoglobin 12.0 - 15.0 g/dL 87.6  86.0  85.9   Hematocrit 36.0 - 46.0 % 39.1  42.1  43.5   Platelets 150 - 400 K/uL 231  291  111        Latest Ref Rng & Units 05/18/2024    4:36 AM 11/17/2023    9:17 AM 03/25/2023    2:34 PM  CMP  Glucose 70 - 99 mg/dL 84  76  90   BUN 6 - 20 mg/dL 17  12  12    Creatinine 0.44 - 1.00 mg/dL 9.28  9.18  9.13   Sodium 135 - 145 mmol/L 138  142  141   Potassium 3.5 - 5.1 mmol/L 4.1  3.9  4.4   Chloride 98 - 111 mmol/L 112  108  102   CO2 22 - 32 mmol/L 16  19  20    Calcium 8.9 - 10.3 mg/dL 8.6  9.2  9.5   Total Protein 6.5 - 8.1 g/dL 5.9  6.7  6.7   Total Bilirubin 0.0 - 1.2 mg/dL 0.9  0.5  0.2   Alkaline Phos 38 - 126 U/L 52  80  97   AST 15 - 41 U/L 14  9  21    ALT 0 - 44 U/L 5  8  38      RADIOGRAPHIC STUDIES: I have personally reviewed the radiological images as listed and agreed with the findings in the report. VAS US  LOWER EXTREMITY VENOUS (DVT) (ONLY [facility] & [facility]) Result Date: 05/18/2024  Lower Venous DVT Study Patient Name:  LACREASHA HINDS Warren  Date of Exam:   05/18/2024 Medical Rec #: [id number]                    Accession #:    [id number] Date of Birth: July 04, 1976                    Patient Gender: F Patient Age:   54 years Exam Location:  [facility] Procedure:      VAS US   LOWER EXTREMITY VENOUS (DVT) Referring Phys: PRENTICE TEE --------------------------------------------------------------------------------  Indications: Pulmonary embolism.  Risk Factors: Multiple sclerosis, hemiparesis of the right side. Limitations: Poor ultrasound/tissue interface and right calf. Comparison Study: 07/27/2018 - Negative Performing Technologist: Ricka Sturdivant-Jones RDMS, RVT  Examination Guidelines: A complete evaluation includes B-mode imaging, spectral Doppler, color Doppler, and power Doppler as needed of all accessible portions of each vessel. Bilateral testing is considered an integral part of a complete examination. Limited examinations for reoccurring indications may be performed as noted. The reflux portion of the exam is performed with  the patient in reverse Trendelenburg.  +---------+---------------+---------+-----------+----------+------------------+ RIGHT    CompressibilityPhasicitySpontaneityPropertiesThrombus Aging     +---------+---------------+---------+-----------+----------+------------------+ CFV      Full           Yes      Yes                                     +---------+---------------+---------+-----------+----------+------------------+ SFJ      Full                                                            +---------+---------------+---------+-----------+----------+------------------+ FV Prox  Full                                                            +---------+---------------+---------+-----------+----------+------------------+ FV Mid   Full                                                            +---------+---------------+---------+-----------+----------+------------------+ FV DistalFull                                                            +---------+---------------+---------+-----------+----------+------------------+ PFV      Full                                                             +---------+---------------+---------+-----------+----------+------------------+ POP      Full           Yes      Yes                                     +---------+---------------+---------+-----------+----------+------------------+ PTV      Full                                                            +---------+---------------+---------+-----------+----------+------------------+ PERO                                                  not clearly  visualized         +---------+---------------+---------+-----------+----------+------------------+   +---------+---------------+---------+-----------+----------+--------------+ LEFT     CompressibilityPhasicitySpontaneityPropertiesThrombus Aging +---------+---------------+---------+-----------+----------+--------------+ CFV      Full           Yes      Yes                                 +---------+---------------+---------+-----------+----------+--------------+ SFJ      Full                                                        +---------+---------------+---------+-----------+----------+--------------+ FV Prox  Full                                                        +---------+---------------+---------+-----------+----------+--------------+ FV Mid   Full                                                        +---------+---------------+---------+-----------+----------+--------------+ FV DistalFull                                                        +---------+---------------+---------+-----------+----------+--------------+ PFV      Full                                                        +---------+---------------+---------+-----------+----------+--------------+ POP      Full           Yes      Yes                                 +---------+---------------+---------+-----------+----------+--------------+ PTV      Full                                                         +---------+---------------+---------+-----------+----------+--------------+ PERO     Full                                                        +---------+---------------+---------+-----------+----------+--------------+    Summary: RIGHT: - There is no evidence of deep vein thrombosis in the lower extremity. However, portions of this examination were limited- see technologist comments above.  - No cystic structure  found in the popliteal fossa.  LEFT: - There is no evidence of deep vein thrombosis in the lower extremity.  - No cystic structure found in the popliteal fossa.  *See table(s) above for measurements and observations. Electronically signed by Lonni Gaskins MD on 05/18/2024 at 10:22:51 AM.    Final    CT Angio Chest PE W and/or Wo Contrast Result Date: 05/18/2024 CLINICAL DATA:  Chest pain. EXAM: CT ANGIOGRAPHY CHEST WITH CONTRAST TECHNIQUE: Multidetector CT imaging of the chest was performed using the standard protocol during bolus administration of intravenous contrast. Multiplanar CT image reconstructions and MIPs were obtained to evaluate the vascular anatomy. RADIATION DOSE REDUCTION: This exam was performed according to the departmental dose-optimization program which includes automated exposure control, adjustment of the mA and/or kV according to patient size and/or use of iterative reconstruction technique. CONTRAST:  75mL OMNIPAQUE  IOHEXOL  350 MG/ML SOLN COMPARISON:  None Available. FINDINGS: Cardiovascular: The heart size is upper normal to borderline enlarged. No substantial pericardial effusion. Small volume segmental and subsegmental pulmonary embolus identified right lower lobe (well seen image 145/6). No evidence for right heart strain. Mediastinum/Nodes: No mediastinal lymphadenopathy. There is no hilar lymphadenopathy. The esophagus has normal imaging features. There is no axillary lymphadenopathy. Lungs/Pleura: The  lungs are clear without focal pneumonia, edema, pneumothorax or pleural effusion. 4 mm anterior left lung nodule identified on 61/7 dependent atelectasis noted in the lung bases. No overt pulmonary infarct. Upper Abdomen: 1.9 cm hypoattenuating lesion is identified in the tail the pancreas. Musculoskeletal: No worrisome lytic or sclerotic osseous abnormality. Healed posterior right rib fractures Review of the MIP images confirms the above findings. IMPRESSION: 1. Small volume segmental and subsegmental pulmonary embolus in the right lower lobe. No evidence for right heart strain or overt pulmonary infarct. 2. 1.9 cm hypoattenuating lesion in the tail the pancreas. MRI abdomen with and without contrast recommended to further evaluate. 3. 4 mm anterior left lung nodule. No follow-up needed if patient is low-risk.This recommendation follows the consensus statement: Guidelines for Management of Incidental Pulmonary Nodules Detected on CT Images: From the Fleischner Society 2017; Radiology 2017; 284:228-243. Critical Value/emergent results were called by telephone at the time of interpretation on 05/18/2024 at 7:50 am to the ER provider taking over care of Dr. Keller patient's, and he verbally acknowledged these results. Electronically Signed   By: Camellia Candle M.D.   On: 05/18/2024 07:50   DG Chest 2 View Result Date: 05/18/2024 EXAM: 2 VIEW(S) XRAY OF THE CHEST 05/18/2024 05:05:00 AM COMPARISON: None available. CLINICAL HISTORY: CP. Pt BIB EMS from home. C/o sharp non radiating 8/10 CP that started around 9pm after eating. Pain decreased to 4/10 about 1 hr ago. Denies SHOB, N/V. Hx ms, non wt bearing at baseline. FINDINGS: LUNGS AND PLEURA: Lateral left lower lobe pneumonia and small left pleural effusion are present. HEART AND MEDIASTINUM: No acute abnormality of the cardiac and mediastinal silhouettes. BONES AND SOFT TISSUES: No acute osseous abnormality. IMPRESSION: 1. Lateral left lower lobe pneumonia and small  left pleural effusion. Electronically signed by: Lonni Necessary MD 05/18/2024 05:09 AM EDT RP Workstation: [id number]    ASSESSMENT & PLAN CHARVI GAMMAGE is a 48 y.o. female who presents to the rapid diagnostic clinic for evaluation of a pancreatic mass seen on recent CT imaging.   #Pancreatic mass: --Seen on CTA chest from 05/18/2024.  --Discussed possible etiologies including primary pancreatic malignancy, neuroendocrine tumor, benign cystic lesion, IPMN --Labs today to check CBC, CMP, CEA, CA19-9 and chromogranin levels --  Will obtain MRI abdomen to further evaluate pancreatic mass  --We will request GI consultation for EUS with biopsy --RTC once workup is complete  #Pulmonary Embolism: --Currently on Eliquis  therapy. Recommend to continue  No orders of the defined types were placed in this encounter.   All questions were answered. The patient knows to call the clinic with any problems, questions or concerns.  I have spent a total of 60 minutes minutes of face-to-face and non-face-to-face time, preparing to see the patient, obtaining and/or reviewing separately obtained history, performing a medically appropriate examination, counseling and educating the patient, ordering medications/tests/procedures, referring and communicating with other health care professionals, documenting clinical information in the electronic health record, independently interpreting results and communicating results to the patient, and care coordination.   Johnston Police, PA-C Department of Hematology/Oncology [facility] at [facility] Phone: [phone]  Patient was seen with Dr. Federico   I have read the above note and personally examined the patient. I agree with the assessment and plan as noted above.  Briefly Mrs. Corean HERO. Warren is a 48 year old female who presents for evaluation of a pancreatic tail mass.  She presented to the emergency department  on 05/18/2024 with chest pain underwent a CTA which showed a small volume segmental and subsegmental pulmonary embolus in the right lower lobe.  As part of that scan she was noted to have a 1.9 cm hypoattenuating lesion in the tail of the pancreas.  Further evaluation with MRI was recommended.  At this time would recommend ordering tumor markers to include CA 19-9 and CEA.  Additionally we will order an MRI of the abdomen to further assess this pancreatic lesion.  If suspicious would recommend pursuing EUS and biopsy.  The patient voiced understanding of our findings and plan moving forward.   Norleen IVAR Federico, MD Department of Hematology/Oncology [facility] at [facility] Phone: [phone] Pager: [phone] Email: [email or web address]

## 2024-05-18 NOTE — Progress Notes (Signed)
 Venous duplex lower ext  has been completed. Refer to [facility] under chart review to view preliminary results.   05/18/2024  9:52 AM Ebony Warren, [name]

## 2024-05-18 NOTE — ED Triage Notes (Signed)
 Pt BIB EMS from home. C/o sharp non radiating 8/10 CP that started around 9pm after eating. Pain decreased to 4/10 about 1 hr ago. Denies SHOB, N/V.    Hx ms, non wt bearing at baseline  EMS VS SBP 100, 120/66, HR 54 NSR, 100% RA, cbg 89 EMS admin 324 aspirin

## 2024-05-18 NOTE — Telephone Encounter (Signed)
 Patient Product/process development scientist completed.    The patient is insured through [employer]. Patient has Medicare and is not eligible for a copay card, but may be able to apply for patient assistance or Medicare RX Payment Plan (Patient Must reach out to their plan, if eligible for payment plan), if available.    Ran test claim for Eliquis 5 mg and the current 30 day co-pay is $0.00.   This test claim was processed through [facility]- copay amounts may vary at other pharmacies due to pharmacy/plan contracts, or as the patient moves through the different stages of their insurance plan.     Roland Earl, CPHT Pharmacy Technician III Certified Patient Advocate [facility] Patient Advocate Team Direct Number: [phone]  Fax: [phone]

## 2024-05-19 ENCOUNTER — Inpatient Hospital Stay

## 2024-05-19 ENCOUNTER — Inpatient Hospital Stay: Attending: Physician Assistant | Admitting: Physician Assistant

## 2024-05-19 ENCOUNTER — Encounter: Payer: Self-pay | Admitting: Medical Oncology

## 2024-05-19 ENCOUNTER — Other Ambulatory Visit: Payer: Self-pay | Admitting: Medical Oncology

## 2024-05-19 ENCOUNTER — Encounter: Payer: Self-pay | Admitting: Physician Assistant

## 2024-05-19 VITALS — BP 126/77 | HR 81 | Temp 98.1°F | Resp 16

## 2024-05-19 DIAGNOSIS — K8689 Other specified diseases of pancreas: Secondary | ICD-10-CM | POA: Insufficient documentation

## 2024-05-19 DIAGNOSIS — K869 Disease of pancreas, unspecified: Secondary | ICD-10-CM

## 2024-05-19 DIAGNOSIS — R933 Abnormal findings on diagnostic imaging of other parts of digestive tract: Secondary | ICD-10-CM | POA: Insufficient documentation

## 2024-05-19 DIAGNOSIS — Z7901 Long term (current) use of anticoagulants: Secondary | ICD-10-CM | POA: Diagnosis not present

## 2024-05-19 DIAGNOSIS — I2693 Single subsegmental pulmonary embolism without acute cor pulmonale: Secondary | ICD-10-CM | POA: Insufficient documentation

## 2024-05-19 DIAGNOSIS — Z87891 Personal history of nicotine dependence: Secondary | ICD-10-CM | POA: Diagnosis not present

## 2024-05-19 DIAGNOSIS — R978 Other abnormal tumor markers: Secondary | ICD-10-CM | POA: Diagnosis not present

## 2024-05-19 LAB — CBC WITH DIFFERENTIAL (CANCER CENTER ONLY)
Abs Immature Granulocytes: 0.01 10*3/uL (ref 0.00–0.07)
Basophils Absolute: 0 10*3/uL (ref 0.0–0.1)
Basophils Relative: 1 %
Eosinophils Absolute: 0.1 10*3/uL (ref 0.0–0.5)
Eosinophils Relative: 2 %
HCT: 39.1 % (ref 36.0–46.0)
Hemoglobin: 12.8 g/dL (ref 12.0–15.0)
Immature Granulocytes: 0 %
Lymphocytes Relative: 24 %
Lymphs Abs: 1.5 10*3/uL (ref 0.7–4.0)
MCH: 30.5 pg (ref 26.0–34.0)
MCHC: 32.7 g/dL (ref 30.0–36.0)
MCV: 93.3 fL (ref 80.0–100.0)
Monocytes Absolute: 0.6 10*3/uL (ref 0.1–1.0)
Monocytes Relative: 9 %
Neutro Abs: 4 10*3/uL (ref 1.7–7.7)
Neutrophils Relative %: 64 %
Platelet Count: 276 10*3/uL (ref 150–400)
RBC: 4.19 MIL/uL (ref 3.87–5.11)
RDW: 13.9 % (ref 11.5–15.5)
WBC Count: 6.3 10*3/uL (ref 4.0–10.5)
nRBC: 0 % (ref 0.0–0.2)

## 2024-05-19 LAB — CMP (CANCER CENTER ONLY)
ALT: 5 U/L (ref 0–44)
AST: 7 U/L — ABNORMAL LOW (ref 15–41)
Albumin: 4.2 g/dL (ref 3.5–5.0)
Alkaline Phosphatase: 68 U/L (ref 38–126)
Anion gap: 7 (ref 5–15)
BUN: 15 mg/dL (ref 6–20)
CO2: 23 mmol/L (ref 22–32)
Calcium: 9 mg/dL (ref 8.9–10.3)
Chloride: 109 mmol/L (ref 98–111)
Creatinine: 0.7 mg/dL (ref 0.44–1.00)
GFR, Estimated: >60 mL/min (ref >60)
Glucose, Bld: 82 mg/dL (ref 70–99)
Potassium: 3.6 mmol/L (ref 3.5–5.1)
Sodium: 139 mmol/L (ref 135–145)
Total Bilirubin: 0.3 mg/dL (ref 0.0–1.2)
Total Protein: 7.2 g/dL (ref 6.5–8.1)

## 2024-05-19 NOTE — Progress Notes (Signed)
 Rapid Diagnostic Clinic  Patient presented to clinic, with her mom by her side, for her scheduled appointment with Irene Thayil, PA-C. I introduced myself and provided them with my direct contact information. Patient and mom were both encouraged to call me with any questions/concerns they may have.  Ebony Hedges, RN, BSN, [facility] Oncology Nurse Navigator, Rapid Diagnostic Clinic 05/19/2024 2:35 PM

## 2024-05-20 LAB — CANCER ANTIGEN 19-9: CA 19-9: 8 U/mL (ref 0–35)

## 2024-05-21 LAB — CHROMOGRANIN A: Chromogranin A (ng/mL): 210 ng/mL — ABNORMAL HIGH (ref 0.0–101.8)

## 2024-05-22 ENCOUNTER — Telehealth: Payer: Self-pay | Admitting: Physician Assistant

## 2024-05-22 LAB — CEA (ACCESS): CEA (CHCC): <1 ng/mL (ref 0.00–5.00)

## 2024-05-22 NOTE — Telephone Encounter (Signed)
 I called and spoke to patient to review the lab results from 05/19/2024. Findings were remarkable for elevated chromogranin levels which is suggestive of neuroendocrine tumor. CEA and CA19-9 levels are normal.   Recommend to proceed with MRI abdomen to further evaluate pancreatic mass to determine if biopsy is needed.   Ebony Warren expressed understanding of the plan provided

## 2024-05-23 ENCOUNTER — Other Ambulatory Visit: Payer: Self-pay | Admitting: Physician Assistant

## 2024-05-23 ENCOUNTER — Ambulatory Visit (HOSPITAL_COMMUNITY)
Admission: RE | Admit: 2024-05-23 | Discharge: 2024-05-23 | Disposition: A | Discharge status: Home / Self Care | Source: Ambulatory Visit | Attending: Physician Assistant | Admitting: Physician Assistant

## 2024-05-23 DIAGNOSIS — K8689 Other specified diseases of pancreas: Secondary | ICD-10-CM

## 2024-05-23 DIAGNOSIS — Z9049 Acquired absence of other specified parts of digestive tract: Secondary | ICD-10-CM | POA: Diagnosis not present

## 2024-05-23 MED ORDER — GADOBUTROL 1 MMOL/ML IV SOLN
10.0000 mL | Freq: Once | INTRAVENOUS | Status: AC | PRN
  Administered 2024-05-23: 10 mL via INTRAVENOUS

## 2024-05-24 ENCOUNTER — Other Ambulatory Visit: Payer: Self-pay | Admitting: Physician Assistant

## 2024-05-24 ENCOUNTER — Telehealth: Payer: Self-pay | Admitting: Gastroenterology

## 2024-05-24 ENCOUNTER — Encounter: Payer: Self-pay | Admitting: Medical Oncology

## 2024-05-24 DIAGNOSIS — K869 Disease of pancreas, unspecified: Secondary | ICD-10-CM

## 2024-05-24 DIAGNOSIS — K8689 Other specified diseases of pancreas: Secondary | ICD-10-CM

## 2024-05-24 NOTE — Telephone Encounter (Signed)
 Dr. Brice Campi,  Urgent referral in [place] for EUS for pancreatic lesion.  Last saw Dr. Dominic Friendly in 2024.  Please review.    Thanks, [employer]

## 2024-05-24 NOTE — Telephone Encounter (Signed)
 Separate conversation/discussion with hematology/oncology ongoing at this time. Once we have come up with a consensus, will reply to this telephone note with next steps. Thanks. GM

## 2024-05-26 NOTE — Telephone Encounter (Signed)
 This patient's case has been discussed with hematology/oncology. I would like to have the patient come in to discuss these findings with myself or Dr. Dominic Friendly before pursuing endoscopic ultrasound. My recommendation would be to perform an interval MRI/MRCP, as other red flag signs/symptoms on the imaging are not found that would normally require EUS at this time based on ASGE/ACG/international guidelines currently.  Ebony Warren, Please move forward with scheduling a clinic visit with Dr. Dominic Friendly (her primary) or myself for discussion of pancreatic cyst and surveillance versus need for EUS. Thanks. GM

## 2024-05-26 NOTE — Telephone Encounter (Signed)
 Sounds good, Ebony Warren.  Thanks a lot.  - HD

## 2024-05-26 NOTE — Telephone Encounter (Signed)
 Please move forward with scheduling a clinic visit with Dr. Dominic Friendly (her primary) or myself for discussion of pancreatic cyst and surveillance versus need for EUS. Thanks. GM

## 2024-05-29 NOTE — Telephone Encounter (Signed)
 Can one of you please call pt per Dr Wilhelmenia and set up appt with Dr Legrand next available?

## 2024-05-30 ENCOUNTER — Telehealth (INDEPENDENT_AMBULATORY_CARE_PROVIDER_SITE_OTHER): Admitting: Internal Medicine

## 2024-05-30 ENCOUNTER — Encounter (INDEPENDENT_AMBULATORY_CARE_PROVIDER_SITE_OTHER): Payer: Self-pay | Admitting: Internal Medicine

## 2024-05-30 VITALS — Ht 68.0 in

## 2024-05-30 DIAGNOSIS — L304 Erythema intertrigo: Secondary | ICD-10-CM | POA: Diagnosis not present

## 2024-05-30 DIAGNOSIS — Z6834 Body mass index (BMI) 34.0-34.9, adult: Secondary | ICD-10-CM | POA: Diagnosis not present

## 2024-05-30 DIAGNOSIS — K5900 Constipation, unspecified: Secondary | ICD-10-CM | POA: Diagnosis not present

## 2024-05-30 DIAGNOSIS — K862 Cyst of pancreas: Secondary | ICD-10-CM | POA: Insufficient documentation

## 2024-05-30 DIAGNOSIS — E669 Obesity, unspecified: Secondary | ICD-10-CM | POA: Diagnosis not present

## 2024-05-30 DIAGNOSIS — G35 Multiple sclerosis: Secondary | ICD-10-CM

## 2024-05-30 DIAGNOSIS — I2694 Multiple subsegmental pulmonary emboli without acute cor pulmonale: Secondary | ICD-10-CM | POA: Diagnosis not present

## 2024-05-30 NOTE — Telephone Encounter (Signed)
 Inbound call from patient, was scheduled for 8/22 with Dr. 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 Patient would like to speak to [facility].

## 2024-05-30 NOTE — Telephone Encounter (Signed)
 The pt has been advised that Dr Wilhelmenia did review her chart and is aware appt has been made.  No further questions at this time.

## 2024-05-30 NOTE — Progress Notes (Signed)
 Virtual Visit via Video Note  I connected withNAME@ on 05/30/24 at  2:00 PM EDT by a video enabled telemedicine application and verified that I am speaking with the correct person using two identifiers.  Location:  Patient: Home Provider: Office   I discussed the limitations of evaluation and management by telemedicine and the availability of in person appointments. The patient expressed understanding and agreed to proceed.    Weight Summary And Biometrics  No data recorded Anthropometric Measurements Height: 5' 8 (1.727 m) Weight at Last Visit: 229 lb Starting Weight: 309 lb Total Weight Loss (lbs): 80 lb (36.3 kg) Peak Weight: 312 lb   No data recorded  RMR: 1426  Today's Visit #: 17  Starting Date: 02/22/23   Subjective   Chief Complaint: Obesity  Ebony Warren is here to discuss her progress with her obesity treatment plan. She is on the the Category 2 Plan and states she is following her eating plan approximately 75% of the time. She states she is not exercising.  Weight Progress Since Last Visit:  Discussed the use of AI scribe software for clinical note transcription with the patient, who gave verbal consent to proceed.  History of Present Illness     Chest pain and pulmonary embolism - Chest pain onset at 3:00 AM, prompting transport to the hospital by paramedics - CT scan revealed a small pulmonary embolism and a pulmonary nodule - EKG performed sinus rhythm nonspecific T wave changes - Started on Eliquis  5 mg twice daily for anticoagulation - No lower extremity deep vein thrombosis on leg ultrasound - MRI of abdomen did not reveal additional clots  Pulmonary nodule and pancreatic cyst - CT scan identified a pulmonary nodule and a pancreatic cyst - MRI performed for further evaluation of abdominal findings -MRI showed a pancreatic cyst measuring 1.8 x 1.91 1.5 cm there was a cystic structure in the tail of the pancreas.  Constipation -  Constipation identified during MRI evaluation - Currently taking Miralax  twice daily - Bowel movements have improved since yesterday  Fatigue and general well-being - Overall feeling of well-being described as 'great' - Experiencing fatigue  Multiple sclerosis exacerbation risk - History of multiple sclerosis with symptoms exacerbated by heat - Avoided in-person visit due to high temperatures       Patient Reported Barriers to Progress: orthopedic problems, medical conditions or chronic pain affecting mobility, medical comorbidities, and recent hospital evaluation.      Pharmacotherapy for weight management: She is currently taking Metformin  (off label use for incretin effect and / or insulin  resistance and / or diabetes prevention) with adequate clinical response  and without side effects. and Topiramate  (off label use, single agent) with adequate clinical response  and without side effects..   Assessment and Plan   Treatment Plan For Obesity:  Recommended Dietary Goals  Ebony Warren is currently in the action stage of change. As such, her goal is to continue weight management plan. She has agreed to: continue current plan  Behavioral Health and Counseling  We discussed the following behavioral modification strategies today: continue to work on maintaining a reduced calorie state, getting the recommended amount of protein, incorporating whole foods, making healthy choices, staying well hydrated and practicing mindfulness when eating..  Additional education and resources provided today: None  Recommended Physical Activity Goals  Ebony Warren has been advised to work up to 150 minutes of moderate intensity aerobic activity a week and strengthening exercises 2-3 times per week for cardiovascular health, weight loss maintenance and  preservation of muscle mass.   She has agreed to :  Resume physical activity as tolerated  Pharmacotherapy  We discussed various medication options to  help Ebony Warren with her weight loss efforts and we both agreed to : Adequate clinical response to anti-obesity medication, continue current regimen  Associated Conditions Impacted by Obesity Treatment  Multiple subsegmental pulmonary emboli without acute cor pulmonale (HCC)  Pancreatic cyst  Multiple sclerosis (HCC)  Intertrigo    Assessment and Plan    Pulmonary Embolism Ebony Warren presented with chest pain and was diagnosed with a small pulmonary embolism via CT scan. The etiology is unclear as her legs and abdomen were clear of clots on ultrasound. Immobility due to multiple sclerosis may have contributed to clot formation. - Continue Eliquis  (apixaban ) 5 mg twice daily for 3 to 6 months - Consult with oncologist for guidance on duration of anticoagulation therapy, as mobility may play a factor on treatment duration.  Pancreatic Cyst A cystic lesion measuring 1.8 by 1.9 cm with internal septa was identified in the tail of the pancreas on MRI.  - Schedule biopsy of pancreatic cyst  Multiple Sclerosis Ebony Warren's multiple sclerosis can be exacerbated by heat, and she prudently avoided an in-person visit due to high temperatures.  Prediabetes Ebony Warren is currently managed on metformin  for type 2 diabetes mellitus. She recently refilled her prescription and does not require additional refills at this time.  Constipation Ebony Warren reported constipation during the MRI. She has been taking Miralax  twice daily, which has improved her bowel movements. Dulcolax may be considered if constipation persists. - Continue Miralax  twice daily - Consider Dulcolax if constipation persists  Intertrigo Ebony Warren's rash under her stomach area is well-controlled.  Follow-up Ebony Warren has a follow-up appointment scheduled for July. She is advised to consider virtual visits if overwhelmed by in-person visits due to her medical conditions. - Attend follow-up appointment in July - Consider virtual  visits if overwhelmed by in-person visits         Objective   Physical Exam:  Height 5' 8 (1.727 m). Body mass index is 34.82 kg/m.  Patient unable to weigh due to wheelchair status  General: She is overweight, cooperative, alert, well developed, and in no acute distress. PSYCH: Has normal mood, affect and thought process.   Lungs: Normal breathing effort, no conversational dyspnea.    Diagnostic Data Reviewed:  BMET    Component Value Date/Time   NA 139 05/19/2024 1455   NA 142 11/17/2023 0917   K 3.6 05/19/2024 1455   CL 109 05/19/2024 1455   CO2 23 05/19/2024 1455   GLUCOSE 82 05/19/2024 1455   BUN 15 05/19/2024 1455   BUN 12 11/17/2023 0917   CREATININE 0.70 05/19/2024 1455   CALCIUM 9.0 05/19/2024 1455   GFRNONAA >60 05/19/2024 1455   GFRAA 87 12/10/2020 1251   Lab Results  Component Value Date   HGBA1C 5.2 11/26/2023   HGBA1C 5.3 08/19/2022   Lab Results  Component Value Date   INSULIN  4.8 11/17/2023   INSULIN  25.0 (H) 02/23/2023   Lab Results  Component Value Date   TSH 1.690 02/23/2023   CBC    Component Value Date/Time   WBC 6.3 05/19/2024 1455   WBC 5.4 05/18/2024 0436   RBC 4.19 05/19/2024 1455   HGB 12.8 05/19/2024 1455   HGB 13.9 01/04/2024 1352   HCT 39.1 05/19/2024 1455   HCT 42.1 01/04/2024 1352   PLT 276 05/19/2024 1455   PLT 291 01/04/2024 1352   MCV  93.3 05/19/2024 1455   MCV 95 01/04/2024 1352   MCH 30.5 05/19/2024 1455   MCHC 32.7 05/19/2024 1455   RDW 13.9 05/19/2024 1455   RDW 13.0 01/04/2024 1352   Iron Studies    Component Value Date/Time   IRON 68 10/27/2022 1056   TIBC 313 10/27/2022 1056   FERRITIN 41 10/27/2022 1056   IRONPCTSAT 22 10/27/2022 1056   Lipid Panel     Component Value Date/Time   CHOL 149 02/23/2023 1305   TRIG 72 02/23/2023 1305   HDL 50 02/23/2023 1305   LDLCALC 85 02/23/2023 1305   Hepatic Function Panel     Component Value Date/Time   PROT 7.2 05/19/2024 1455   PROT 6.7 11/17/2023  0917   ALBUMIN 4.2 05/19/2024 1455   ALBUMIN 4.2 11/17/2023 0917   AST 7 (L) 05/19/2024 1455   ALT 5 05/19/2024 1455   ALKPHOS 68 05/19/2024 1455   BILITOT 0.3 05/19/2024 1455      Component Value Date/Time   TSH 1.690 02/23/2023 1305   Nutritional Lab Results  Component Value Date   VD25OH 55.6 02/23/2023   VD25OH 38.1 09/23/2021   VD25OH 34.1 12/22/2016    Follow-Up   No follow-ups on file.Ebony Warren She was informed of the importance of frequent follow up visits to maximize her success with intensive lifestyle modifications for her multiple health conditions.  Attestation Statement   Reviewed by clinician on day of visit: allergies, medications, problem list, medical history, surgical history, family history, social history, and previous encounter notes.   I discussed the assessment and treatment plan with the patient. The patient was provided an opportunity to ask questions and all were answered. The patient agreed with the plan and demonstrated an understanding of the instructions.   The patient was advised to call back or seek an in-person evaluation if the symptoms worsen or if the condition fails to improve as anticipated.   I have spent 26 minutes in the care of the patient today including: 3 minutes before the visit reviewing and preparing the chart. 15 minutes face-to-face assessing and reviewing listed medical problems as outlined in obesity care plan, providing nutritional and behavioral counseling on topics outlined in the obesity care plan, independently interpreting test results and goals of care, as described in assessment and plan, and reviewing hospital records 8 minutes after the visit updating chart and documentation of encounter.  Lucas Parker, MD

## 2024-05-31 ENCOUNTER — Encounter: Payer: Self-pay | Admitting: Medical Oncology

## 2024-05-31 ENCOUNTER — Telehealth: Payer: Self-pay

## 2024-05-31 NOTE — Telephone Encounter (Signed)
-----   Message from [facility] sent at 05/31/2024  9:45 AM EDT ----- JD, Thanks for reaching out. She really does not need an EUS for this lesion based on size and criteria and rather surveillance. However, if patient is significantly concerned after we discuss things with her, I can offer EUS to her if she understands the risks of pancreatitis, but we could sample her, not get enough fluid, cause her pancreatitis and then she and I would likely be upset by that. Risk is 2% when we touch these lesions. Usually, these cysts needs to be 3 cm in size to get adequate fluid for analysis to be sent. I have only 1.5 days of clinic in July which is why this patient got scheduled in August. I can try to find a day to work her in with me or with Dr. Legrand (her primary GI). I can also set up an EUS slot (incase we do decide to move forward with EUS - though schedule for that is currently in September) GM  Adrieanna Boteler, Please add her for a MyChart visit @1150AM  on 7/16 (trying my best to be on time with our schedule already being overbooked for that morning). Please go ahead and save a tentative spot for EUS for her in September (if after discussion that I have with her we both agree to move forward); we can cancel this appointment if necessary after I have the MyChart clinic appointment with her. Thanks. GM  HD,  I don't think you have any other held/overbook slots to see her any sooner but if you do, that may be helpful to have the discussion. ----- Message ----- From: Federico Norleen ONEIDA MADISON, MD Sent: 05/31/2024   8:56 AM EDT To: Aloha Wilhelmenia Raddle., MD  Walterine Bass,  [facility] you are doing well. Wanted to reach out to you about Mrs. Stills-Benthell. 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 She has a visit scheduled with you in August 2025 and is quite nervous about waiting that long for an EUS. Is there any way we could have her seen sooner?  Thank you for your help.  Best,  Gordy

## 2024-05-31 NOTE — Telephone Encounter (Signed)
 My Chart visit made for 7/16 at 1150 am as requested. Unable to make EUS appt due to the schedule not being available.

## 2024-06-01 ENCOUNTER — Other Ambulatory Visit: Payer: Self-pay

## 2024-06-01 DIAGNOSIS — K8689 Other specified diseases of pancreas: Secondary | ICD-10-CM

## 2024-06-01 NOTE — Telephone Encounter (Signed)
 Schedule is open EUS has been set up for 9/8 at [facility] with GM   Anti coag letter has been sent to Dr Francyne  The pt will keep appt as planned to see GM in the office

## 2024-06-06 ENCOUNTER — Other Ambulatory Visit: Payer: Self-pay

## 2024-06-06 DIAGNOSIS — G35 Multiple sclerosis: Secondary | ICD-10-CM

## 2024-06-06 DIAGNOSIS — Z79899 Other long term (current) drug therapy: Secondary | ICD-10-CM

## 2024-06-06 NOTE — Progress Notes (Signed)
 Patient care center reached out stating this pt is on their schedule for Ocrevus  infusion next Tuesday. Req Ocrevus  and pre-medication orders.  Looks like she needs updated IGA/IGM/IGG, I have placed lab orders. I reached out to pt to see if she can come into office to get those drawn. Patient will come into our office Thursday to get labs drawn. I will placed order once pts labs come return. Reminder sat to check for pts labs Monday morning.

## 2024-06-07 ENCOUNTER — Encounter: Payer: Self-pay | Admitting: Internal Medicine

## 2024-06-07 ENCOUNTER — Ambulatory Visit

## 2024-06-07 VITALS — Ht 68.0 in | Wt 229.0 lb

## 2024-06-07 DIAGNOSIS — Z0000 Encounter for general adult medical examination without abnormal findings: Secondary | ICD-10-CM | POA: Diagnosis not present

## 2024-06-07 NOTE — Progress Notes (Signed)
 Because this visit was a virtual/telehealth visit,  certain criteria was not obtained, such a blood pressure, CBG if applicable, and timed get up and go. Any medications not marked as taking were not mentioned during the medication reconciliation part of the visit. Any vitals not documented were not able to be obtained due to this being a telehealth visit or patient was unable to self-report a recent blood pressure reading due to a lack of equipment at home via telehealth. Vitals that have been documented are verbally provided by the patient.   Subjective:   Ebony Warren is a 48 y.o. who presents for a Medicare Wellness preventive visit.  As a reminder, Annual Wellness Visits don't include a physical exam, and some assessments may be limited, especially if this visit is performed virtually. We may recommend an in-person follow-up visit with your provider if needed.  Visit Complete: Virtual I connected with  Ebony Warren on 06/07/24 by a audio enabled telemedicine application and verified that I am speaking with the correct person using two identifiers.  Patient Location: Home  Provider Location: Office/Clinic  I discussed the limitations of evaluation and management by telemedicine. The patient expressed understanding and agreed to proceed.  Vital Signs: Because this visit was a virtual/telehealth visit, some criteria may be missing or patient reported. Any vitals not documented were not able to be obtained and vitals that have been documented are patient reported.  VideoDeclined- This patient declined Librarian, academic. Therefore the visit was completed with audio only.  Persons Participating in Visit: Patient.  AWV Questionnaire: No: Patient Medicare AWV questionnaire was not completed prior to this visit.  Cardiac Risk Factors include: advanced age (>68men, >56 women)     Objective:    Today's Vitals   06/07/24 1422   Weight: 229 lb (103.9 kg)  Height: 5' 8 (1.727 m)  PainSc: 0-No pain   Body mass index is 34.82 kg/m.     06/07/2024    2:27 PM 05/05/2023    8:30 AM 03/24/2023    9:19 AM 01/27/2023    1:20 PM 08/19/2022   10:59 AM 06/12/2022   12:20 PM 02/04/2022   11:16 AM  Advanced Directives  Does Patient Have a Medical Advance Directive? No Yes Yes Yes Yes Yes Yes  Type of Furniture conservator/restorer;Living will Healthcare Power of [place];Living will Healthcare Power of [place];Living will Healthcare Power of [place];Living will Healthcare Power of [employer] of [place];Living will  Does patient want to make changes to medical advance directive?   No - Patient declined No - Patient declined No - Patient declined No - Patient declined   Copy of Healthcare Power of Attorney in Chart?  No - copy requested No - copy requested No - copy requested No - copy requested No - copy requested No - copy requested  Would patient like information on creating a medical advance directive? No - Patient declined          Current Medications (verified) Outpatient Encounter Medications as of 06/07/2024  Medication Sig   acetaminophen  (TYLENOL ) 160 MG/5ML elixir Take 15 mg/kg by mouth every 4 (four) hours as needed for fever (OTC).   Apixaban  Starter Pack, 10mg  and 5mg , (ELIQUIS  DVT/PE STARTER PACK) Take 2 tablets (10mg ) by mouth twice daily for 7 days, then 1 tablet (5mg ) twice daily   ARIPiprazole  (ABILIFY ) 5 MG tablet Take 5 mg by mouth daily.   baclofen  (LIORESAL ) 20 MG tablet  Take 1 tablet (20 mg total) by mouth 3 (three) times daily.   cholecalciferol (VITAMIN D ) 1000 UNITS tablet Take 1,000 Units by mouth daily.   diphenhydrAMINE  (BENADRYL ) 25 mg capsule Take 25 mg by mouth every 6 (six) hours as needed for allergies.   DULoxetine  (CYMBALTA ) 30 MG capsule Take 1 capsule by mouth daily.   LORazepam  (ATIVAN ) 1 MG tablet Take 1 tablet (1 mg total) by mouth daily.   losartan   (COZAAR ) 50 MG tablet Take 1 tablet (50 mg total) by mouth daily.   metFORMIN  (GLUCOPHAGE -XR) 500 MG 24 hr tablet Take 1 tablet (500 mg total) by mouth 2 (two) times daily with a meal.   Multiple Vitamins-Minerals (MULTIVITAMIN ADULT PO) Take 1 tablet by mouth daily.   nystatin  (MYCOSTATIN /NYSTOP ) powder Apply 1 Application topically 2 (two) times daily.   ocrelizumab  (OCREVUS ) 300 MG/10ML injection Infuse 600 mg IV Every 6 months   oxybutynin  (DITROPAN -XL) 10 MG 24 hr tablet Take 1 tablet (10 mg total) by mouth daily as needed (to prevent incontinence).   pantoprazole  (PROTONIX ) 40 MG tablet Take 1 tablet (40 mg total) by mouth daily.   polyethylene glycol powder (GLYCOLAX /MIRALAX ) 17 GM/SCOOP powder Take 17 g by mouth 2 (two) times daily as needed.   Potassium (POTASSIMIN PO) Take by mouth daily at 12 noon.   tiZANidine  (ZANAFLEX ) 4 MG tablet Take 1 tablet (4 mg total) by mouth 3 (three) times daily.   topiramate  (TOPAMAX ) 50 MG tablet Take 1 tablet (50 mg total) by mouth 2 (two) times daily.   Facility-Administered Encounter Medications as of 06/07/2024  Medication   diphenhydrAMINE  (BENADRYL ) injection 50 mg   famotidine  (PEPCID ) 20 mg in sodium chloride  0.9 % 50 mL IVPB   methylPREDNISolone  sodium succinate (SOLU-MEDROL ) 125 mg in sodium chloride  0.9 % 50 mL IVPB   ocrelizumab  (OCREVUS ) 600 mg in sodium chloride  0.9 % 250 mL    Allergies (verified) Bioflavonoids, Oxycodone -acetaminophen , Hydrocodone , Morphine , Orange fruit [citrus], Oxycodone , Tomato, and Other   History: Past Medical History:  Diagnosis Date   ADD (attention deficit disorder)    Anxiety and depression    Arthritis    Bradycardia    Constipation    Edema of both lower extremities    Encounter to establish care 01/22/2021   Family history of adverse reaction to anesthesia    nausea   Food allergy    Gallbladder problem    GERD (gastroesophageal reflux disease)    Headache    Hypertension    Multiple  sclerosis (HCC)    Obesity    Osteoarthritis    Screening mammogram, encounter for 01/22/2021   Seizures (HCC)    told that they are not from the brain but stress related   Swelling of lower extremity 03/24/2023   Vitamin D  deficiency    Past Surgical History:  Procedure Laterality Date   BREAST REDUCTION SURGERY Bilateral 06/23/2021   Procedure: MAMMARY REDUCTION  (BREAST);  Surgeon: Elisabeth Craig RAMAN, MD;  Location: [facility] OR;  Service: Plastics;  Laterality: Bilateral;   CHOLECYSTECTOMY     KNEE SURGERY Bilateral    Arthroscopy   Family History  Problem Relation Age of Onset   Hypertension Mother    Atrial fibrillation Mother    Thyroid  disease Mother    Endometrial cancer Mother    Obesity Mother    Colon polyps Mother    Hypertension Father    Atrial fibrillation Maternal Grandmother    Lung cancer Maternal Grandfather    Social  History   Socioeconomic History   Marital status: Single    Spouse name: Not on file   Number of children: 1   Years of education: College   Highest education level: Not on file  Occupational History    Employer: OTHER    Comment: disabled  Tobacco Use   Smoking status: Former    Current packs/day: 0.00    Average packs/day: 0.4 packs/day for 3.0 years (1.1 ttl pk-yrs)    Types: Cigarettes    Start date: 12/08/2003    Quit date: 12/07/2006    Years since quitting: 17.5   Smokeless tobacco: Never  Vaping Use   Vaping status: Never Used  Substance and Sexual Activity   Alcohol use: No    Alcohol/week: 0.0 standard drinks of alcohol    Comment: quit: 2013 (socially)   Drug use: Not Currently    Types: Marijuana    Comment: Smokes approx 1-2 per day   Sexual activity: Not Currently  Other Topics Concern   Not on file  Social History Narrative   ** Merged History Encounter **       Patient lives at home with family. Caffeine Use: drinks a cup of hot tea with tecfidera  in the am Patient is right handed. Patient has a college education    Social Drivers of Corporate investment banker Strain: Low Risk  (06/07/2024)   Overall Financial Resource Strain (CARDIA)    Difficulty of Paying Living Expenses: Not hard at all  Food Insecurity: No Food Insecurity (06/07/2024)   Hunger Vital Sign    Worried About Running Out of Food in the Last Year: Never true    Ran Out of Food in the Last Year: Never true  Transportation Needs: No Transportation Needs (06/07/2024)   PRAPARE - Administrator, Civil Service (Medical): No    Lack of Transportation (Non-Medical): No  Physical Activity: Inactive (06/07/2024)   Exercise Vital Sign    Days of Exercise per Week: 0 days    Minutes of Exercise per Session: 0 min  Stress: No Stress Concern Present (06/07/2024)   [employer] of Occupational Health - Occupational Stress Questionnaire    Feeling of Stress: Not at all  Social Connections: Socially Integrated (06/07/2024)   Social Connection and Isolation Panel    Frequency of Communication with Friends and Family: More than three times a week    Frequency of Social Gatherings with Friends and Family: More than three times a week    Attends Religious Services: More than 4 times per year    Active Member of [employer] or Organizations: Yes    Attends Engineer, structural: More than 4 times per year    Marital Status: Married    Tobacco Counseling Counseling given: Not Answered    Clinical Intake:  Pre-visit preparation completed: Yes  Pain : No/denies pain Pain Score: 0-No pain     Nutritional Status: BMI > 30  Obese Nutritional Risks: None Diabetes: No  Lab Results  Component Value Date   HGBA1C 5.2 11/26/2023   HGBA1C 5.3 08/19/2022     How often do you need to have someone help you when you read instructions, pamphlets, or other written materials from your doctor or pharmacy?: 1 - Never  Interpreter Needed?: No  Information entered by :: Johnn Krasowski N. Melina Mosteller, LPN.   Activities of Daily Living      06/07/2024    2:27 PM  In your present state of health, do  you have any difficulty performing the following activities:  Hearing? 1  Vision? 0  Difficulty concentrating or making decisions? 1  Comment YES, THERE ARE COGINITVE ISSUES DUE TO MS  Walking or climbing stairs? 1  Dressing or bathing? 1  Doing errands, shopping? 1  Preparing Food and eating ? N  Using the Toilet? Y  In the past six months, have you accidently leaked urine? Y  Do you have problems with loss of bowel control? Y  Managing your Medications? N  Managing your Finances? N  Housekeeping or managing your Housekeeping? Y    Patient Care Team: Shawn Sick, MD as PCP - General Golden Forestine BROCKS, RN as Oncology Nurse Navigator (Medical Oncology)  I have updated your Care Teams any recent Medical Services you may have received from other providers in the past year.     Assessment:   This is a routine wellness examination for [place].  Hearing/Vision screen Hearing Screening - Comments:: Patient wears hearing aids.  Vision Screening - Comments:: Wears rx glasses - up to date with routine eye exams with [facility]    Goals Addressed             This Visit's Progress    06/07/2024: To continue to maintain my health and stay positive in life.         Depression Screen     06/07/2024    2:28 PM 05/05/2023    8:27 AM 03/24/2023    8:59 AM 01/27/2023    1:20 PM 12/17/2022    9:50 AM 10/27/2022    9:37 AM 08/19/2022   11:01 AM  PHQ 2/9 Scores  PHQ - 2 Score 0 0 0 0 0 0 0  PHQ- 9 Score 7 0 5  10 7      Fall Risk     06/07/2024    2:26 PM 05/05/2023    8:31 AM 03/24/2023    9:18 AM 01/27/2023    1:19 PM 12/23/2022    9:30 AM  Fall Risk   Falls in the past year? 0 1 1 1 1   Number falls in past yr: 0 1 0 0 0  Injury with Fall? 0 1 0 0 0  Risk for fall due to : No Fall Risks History of fall(s);Impaired balance/gait;Orthopedic patient Impaired balance/gait Impaired mobility;Impaired balance/gait Impaired  mobility  Follow up Falls evaluation completed Education provided;Falls prevention discussed;Falls evaluation completed Falls evaluation completed Falls evaluation completed;Falls prevention discussed Falls evaluation completed;Education provided      Data saved with a previous flowsheet row definition    MEDICARE RISK AT HOME:  Medicare Risk at Home Any stairs in or around the home?: No (PATIENT HAS RAMP AT FRONT ENTRANCE) If so, are there any without handrails?: No Home free of loose throw rugs in walkways, pet beds, electrical cords, etc?: Yes Adequate lighting in your home to reduce risk of falls?: Yes Use of a cane, walker or w/c?: Yes Grab bars in the bathroom?: No Shower chair or bench in shower?: Yes Elevated toilet seat or a handicapped toilet?: Yes (BEDSIDE COMMODE)  TIMED UP AND GO:  Was the test performed?  No  Cognitive Function: Declined/Normal: No cognitive concerns noted by patient or family. Patient alert, oriented, able to answer questions appropriately and recall recent events. No signs of memory loss or confusion.    06/07/2024    2:28 PM 10/25/2018   11:35 AM  MMSE - Mini Mental State Exam  Not completed: Unable to  complete   Orientation to time  5  Orientation to Place  5  Registration  3  Attention/ Calculation  4  Recall  1  Language- name 2 objects  2  Language- repeat  1  Language- follow 3 step command  3  Language- read & follow direction  1  Write a sentence  1  Copy design  1  Total score  27        06/07/2024    2:25 PM 05/05/2023    8:28 AM 06/12/2022   12:23 PM  6CIT Screen  What Year? 0 points 0 points 0 points  What month? 0 points 0 points 0 points  What time? 0 points 0 points 0 points  Count back from 20 0 points 0 points 0 points  Months in reverse 0 points 0 points 0 points  Repeat phrase 0 points 0 points 0 points  Total Score 0 points 0 points 0 points    Immunizations Immunization History  Administered Date(s) Administered    Influenza,inj,Quad PF,6+ Mos 11/16/2016   Moderna Sars-Covid-2 Vaccination 08/24/2020, 10/06/2020, 03/08/2021, 09/21/2021, 10/03/2023   Tdap 05/01/2021    Screening Tests Health Maintenance  Topic Date Due   Hepatitis C Screening  Never done   Hepatitis B Vaccines (1 of 3 - 19+ 3-dose series) Never done   Colonoscopy  Never done   COVID-19 Vaccine (6 - 2024-25 season) 11/28/2023   MAMMOGRAM  06/19/2024   Medicare Annual Wellness (AWV)  06/07/2025   Cervical Cancer Screening (HPV/Pap Cotest)  12/18/2027   DTaP/Tdap/Td (2 - Td or Tdap) 05/02/2031   HIV Screening  Completed   HPV VACCINES  Aged Out   Meningococcal B Vaccine  Aged Out   INFLUENZA VACCINE  Discontinued    Health Maintenance  Health Maintenance Due  Topic Date Due   Hepatitis C Screening  Never done   Hepatitis B Vaccines (1 of 3 - 19+ 3-dose series) Never done   Colonoscopy  Never done   COVID-19 Vaccine (6 - 2024-25 season) 11/28/2023   Health Maintenance Items Addressed: Yes Patient aware of current care gaps.  Immunization record was verified by [facility] and updated in patient's chart.  Additional Screening:  Vision Screening: Recommended annual ophthalmology exams for early detection of glaucoma and other disorders of the eye. Would you like a referral to an eye doctor? No    Dental Screening: Recommended annual dental exams for proper oral hygiene  Community Resource Referral / Chronic Care Management: CRR required this visit?  No   CCM required this visit?  No   Plan:    I have personally reviewed and noted the following in the patient's chart:   Medical and social history Use of alcohol, tobacco or illicit drugs  Current medications and supplements including opioid prescriptions. Patient is not currently taking opioid prescriptions. Functional ability and status Nutritional status Physical activity Advanced directives List of other physicians Hospitalizations, surgeries, and ER visits in  previous 12 months Vitals Screenings to include cognitive, depression, and falls Referrals and appointments  In addition, I have reviewed and discussed with patient certain preventive protocols, quality metrics, and best practice recommendations. A written personalized care plan for preventive services as well as general preventive health recommendations were provided to patient.   Roz LOISE Fuller, LPN   01/13/7973   After Visit Summary: (MyChart) Due to this being a telephonic visit, the after visit summary with patients personalized plan was offered to patient via MyChart   Notes:  Patient aware of current care gaps.  Immunization record was verified by [facility] and updated in patient's chart.  Patient is due for the following: Hep C Screening, Hep B vaccines and Colonoscopy.

## 2024-06-07 NOTE — Patient Instructions (Addendum)
 Ms. Ebony Warren , Thank you for taking time out of your busy schedule to complete your Annual Wellness Visit with me. I enjoyed our conversation and look forward to speaking with you again next year. I, as well as your care team,  appreciate your ongoing commitment to your health goals. Please review the following plan we discussed and let me know if I can assist you in the future. Your Game plan/ To Do List    Referrals: If you haven't heard from the office you've been referred to, please reach out to them at the phone provided.   Follow up Visits: Next Medicare AWV with our clinical staff: 06/13/2025 at 2:20 pm phone visit with Nurse health Advisor   Have you seen your provider in the last 6 months (3 months if uncontrolled diabetes)? Yes Next Office Visit with your provider: 06/16/2024 at 10:15 office visit  Clinician Recommendations:  Aim for 30 minutes of exercise or brisk walking, 6-8 glasses of water, and 5 servings of fruits and vegetables each day.       This is a list of the screening recommended for you and due dates:  Health Maintenance  Topic Date Due   Hepatitis C Screening  Never done   Hepatitis B Vaccine (1 of 3 - 19+ 3-dose series) Never done   Colon Cancer Screening  Never done   COVID-19 Vaccine (6 - 2024-25 season) 11/28/2023   Mammogram  06/19/2024   Medicare Annual Wellness Visit  06/07/2025   Pap with HPV screening  12/18/2027   DTaP/Tdap/Td vaccine (2 - Td or Tdap) 05/02/2031   HIV Screening  Completed   HPV Vaccine  Aged Out   Meningitis B Vaccine  Aged Out   Flu Shot  Discontinued    Advanced directives: (Declined) Advance directive discussed with you today. Even though you declined this today, please call our office should you change your mind, and we can give you the proper paperwork for you to fill out. Advance Care Planning is important because it:  [x]  Makes sure you receive the medical care that is consistent with your values, goals, and  preferences  [x]  It provides guidance to your family and loved ones and reduces their decisional burden about whether or not they are making the right decisions based on your wishes.  Follow the link provided in your after visit summary or read over the paperwork we have mailed to you to help you started getting your Advance Directives in place. If you need assistance in completing these, please reach out to us  so that we can help you!  See attachments for Preventive Care and Fall Prevention Tips.

## 2024-06-07 NOTE — Telephone Encounter (Signed)
 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 Since the w/e, she has been coughing up mucous; started out yellow in color. She has been using her son's albuterol/nebulizer-  she's still coughing and now the mucous is white. Appt scheduled 06/16/24; stated she can only come on Fridays.

## 2024-06-08 ENCOUNTER — Other Ambulatory Visit (INDEPENDENT_AMBULATORY_CARE_PROVIDER_SITE_OTHER): Payer: Self-pay

## 2024-06-08 DIAGNOSIS — Z0289 Encounter for other administrative examinations: Secondary | ICD-10-CM

## 2024-06-08 DIAGNOSIS — Z79899 Other long term (current) drug therapy: Secondary | ICD-10-CM

## 2024-06-08 DIAGNOSIS — G35 Multiple sclerosis: Secondary | ICD-10-CM

## 2024-06-08 NOTE — Telephone Encounter (Signed)
 Call to patient this morning.  States she is feeling ok.  Asked patient to go to an Urgent Care if her symptoms worsen before her appointment on 7/11.  Patient stated would go to an Urgent Care if her symptoms worsen.

## 2024-06-12 ENCOUNTER — Telehealth: Payer: Self-pay | Admitting: Diagnostic Neuroimaging

## 2024-06-12 ENCOUNTER — Other Ambulatory Visit: Payer: Self-pay

## 2024-06-12 DIAGNOSIS — Z79899 Other long term (current) drug therapy: Secondary | ICD-10-CM

## 2024-06-12 DIAGNOSIS — D696 Thrombocytopenia, unspecified: Secondary | ICD-10-CM

## 2024-06-12 DIAGNOSIS — G35 Multiple sclerosis: Secondary | ICD-10-CM

## 2024-06-12 DIAGNOSIS — E559 Vitamin D deficiency, unspecified: Secondary | ICD-10-CM

## 2024-06-12 DIAGNOSIS — R29898 Other symptoms and signs involving the musculoskeletal system: Secondary | ICD-10-CM

## 2024-06-12 NOTE — Telephone Encounter (Signed)
 Pt has called to ensure the RN for Dr Margaret is aware that it was on Thursday of last week that she went to the [facility] on [address], phone rep relayed this to [facility], there response was pt was suppose to get infusion tomorrow but bc   they arent posted we cant do it.  Pt will wait to be contacted once results have been reviewed.

## 2024-06-13 ENCOUNTER — Encounter: Payer: Self-pay | Admitting: Diagnostic Neuroimaging

## 2024-06-13 ENCOUNTER — Telehealth: Payer: Self-pay

## 2024-06-13 ENCOUNTER — Encounter (HOSPITAL_COMMUNITY): Payer: Medicare HMO

## 2024-06-13 NOTE — Telephone Encounter (Signed)
 Copied from CRM [id number]. Topic: Appointments - Appointment Cancel/Reschedule >> Jun 12, 2024  4:10 PM Delon DASEN wrote: Patient returning Ashley's call about rescheduling IV appt- please call  [phone]

## 2024-06-14 ENCOUNTER — Other Ambulatory Visit: Payer: Self-pay

## 2024-06-14 ENCOUNTER — Other Ambulatory Visit: Payer: Self-pay | Admitting: Physician Assistant

## 2024-06-14 ENCOUNTER — Telehealth: Payer: Self-pay

## 2024-06-14 DIAGNOSIS — K8689 Other specified diseases of pancreas: Secondary | ICD-10-CM

## 2024-06-14 DIAGNOSIS — I2694 Multiple subsegmental pulmonary emboli without acute cor pulmonale: Secondary | ICD-10-CM

## 2024-06-14 MED ORDER — APIXABAN 5 MG PO TABS: 5.0000 mg | ORAL_TABLET | Freq: Two times a day (BID) | ORAL | 2 refills | Status: DC

## 2024-06-14 NOTE — Telephone Encounter (Signed)
 Per Forestine Raider, RN patient LVM stating she needs a refill on her blood thinner and is bleeding from my rectum  Spoke with pt and she said she woke up this morning with blood in her underwear and about a tablespoon of bright red blood after a bowel movement.  Pt does have hemorrhoids but did not notice the color of her stools.  She has MS but has not had an increase of fatigue, dizziness or any other symptoms.  Pt advised to monitor for now and take a stool softener to decrease the irritation to her hemorrhoids. Pt agreed to this plan of care and will call with any questions or concerns.  RX for Eliquis  sent to [facility] on [address] per pt request

## 2024-06-15 NOTE — Progress Notes (Signed)
 Patient name: Ebony Warren Date of birth: 1976/01/13 Date of visit: 06/16/24  Type of visit: Acute Office Visit   Subjective   Chief concern:  Chief Complaint  Patient presents with   Cough    X 2wks phlegm was yellow in color at first but has now turned white/clear Using son's nebulizer (albuterol) 2 x daily-now feels like symptoms has lessened  Denies fever but has had some chills      Ebony Warren is a 48 y.o. female with a PMHx of Multiple sclerosis w/ hemiplegia, HTN, depression who presents to [facility] clinic for evaluation of cough. Her last visit with the [facility] was on 03/01/24 with Dr. Lovie.  Of note, the patient was having chest pain and presented to the ED on 05/18/24. She was subsequently diagnosed with a pulmonary embolism of the left lung as seen on CT and started on Eliquis  starter pack. She has taken the Eliquis  every day without missing a dose.   For the past 2 weeks she has been having a cough with associated mucus production - initially thick and yellow in color but now it is thin and white after using her son's albuterol/inhaler. She also endorses chills at home but denies fevers, night sweats, shortness of breath, or chest pain. Notes pain upon deep inspiration. Says she checks blood pressure at home and it hasn't been too low or high. At night her cough bothers her more so she takes liquid suspension Tylenol  every 4 hours. Had COVID-19 few years ago and used an albuterol inhaler to help her breathe. Does not feel like postnasal drip that the patient experienced last year.   Patient mentioning right leg pain but also notes that she is due for her Ocrelizumab  injection for her MS on Monday.  Patient Active Problem List   Diagnosis Date Noted   Viral URI with cough 06/16/2024   Pancreatic cyst 05/30/2024   Multiple subsegmental pulmonary emboli without acute cor pulmonale (HCC) 05/30/2024   Photodermatitis due to sun 04/28/2024    Intertrigo 02/17/2024   Perimenopausal symptoms 01/20/2024   Constipation 06/16/2023   Leg edema 05/24/2023   Healthcare maintenance 03/24/2023   Family history of colon cancer 03/24/2023   Post-nasal drip 03/24/2023   Elevated ALT measurement 03/02/2023   Insulin  resistance 03/02/2023   Essential hypertension 12/23/2022   Abnormal menses 10/27/2022   Macromastia 06/23/2021   Gastroesophageal reflux disease 01/22/2021   Class 3 severe obesity with serious comorbidity and body mass index (BMI) of 45.0 to 49.9 in adult 01/22/2021   Generalized anxiety disorder 10/07/2018   Right optic neuritis 05/13/2016   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 Negative for diaphoresis and fever.  HENT:  Positive for sore throat. Negative for congestion, ear pain, nosebleeds and sinus pain.   Respiratory:  Positive for cough and sputum production. Negative for hemoptysis, shortness of breath and wheezing.   Cardiovascular:  Positive for leg  swelling (bilateral). Negative for chest pain and palpitations.  Musculoskeletal:  Negative for myalgias.    Current Outpatient Medications  Medication Instructions   acetaminophen  (TYLENOL ) 160 MG/5ML elixir 15 mg/kg, Every 4 hours PRN   apixaban  (ELIQUIS ) 5 mg, Oral, 2 times daily   ARIPiprazole  (ABILIFY ) 5 mg, Daily   baclofen  (LIORESAL ) 20 mg, Oral, 3 times daily   cholecalciferol (VITAMIN D3) 1,000 Units,  Daily   diphenhydrAMINE  (BENADRYL ) 25 mg, Every 6 hours PRN   DULoxetine  (CYMBALTA ) 30 MG capsule 1 capsule, Daily   LORazepam  (ATIVAN ) 1 mg, Oral, Daily   losartan  (COZAAR ) 50 mg, Oral, Daily   metFORMIN  (GLUCOPHAGE -XR) 500 mg, Oral, 2 times daily with meals   Multiple Vitamins-Minerals (MULTIVITAMIN ADULT PO) 1 tablet, Daily   nystatin  (MYCOSTATIN /NYSTOP ) powder 1 Application, Topical, 2 times daily   ocrelizumab  (OCREVUS ) 300 MG/10ML injection Infuse 600 mg IV Every 6 months   oxybutynin  (DITROPAN -XL) 10 mg, Oral, Daily PRN   pantoprazole  (PROTONIX ) 40 mg, Oral, Daily   polyethylene glycol powder (GLYCOLAX /MIRALAX ) 17 g, Oral, 2 times daily PRN   Potassium (POTASSIMIN PO) Daily   tiZANidine  (ZANAFLEX ) 4 mg, Oral, 3 times daily   topiramate  (TOPAMAX ) 50 mg, Oral, 2 times daily    Social History   Tobacco Use   Smoking status: Former    Current packs/day: 0.00    Average packs/day: 0.4 packs/day for 3.0 years (1.1 ttl pk-yrs)    Types: Cigarettes    Start date: 12/08/2003    Quit date: 12/07/2006    Years since quitting: 17.5   Smokeless tobacco: Never  Vaping Use   Vaping status: Never Used  Substance Use Topics   Alcohol use: No    Alcohol/week: 0.0 standard drinks of alcohol    Comment: quit: 2013 (socially)   Drug use: Not Currently    Types: Marijuana    Comment: Smokes approx 1-2 per day      Objective  Today's Vitals   06/16/24 1011  BP: 130/84  Pulse: (!) 56  Temp: 97.7 F (36.5 C)  TempSrc: Oral  SpO2: 98%  Height: 5' 8 (1.727 m)  Body mass index is 34.82 kg/m.   Physical Exam Constitutional:      General: She is not in acute distress.    Appearance: Normal appearance. She is not ill-appearing, toxic-appearing or diaphoretic.  HENT:     Head: Normocephalic and atraumatic.     Mouth/Throat:     Mouth: Mucous membranes are moist.     Pharynx: Oropharynx is clear. No oropharyngeal exudate or posterior oropharyngeal erythema.  Eyes:     General: No  scleral icterus.       Right eye: No discharge.        Left eye: No discharge.     Conjunctiva/sclera: Conjunctivae normal.  Cardiovascular:     Rate and Rhythm: Normal rate and regular rhythm.     Heart sounds: Normal heart sounds. No murmur heard. Pulmonary:     Effort: Pulmonary effort is normal. No respiratory distress.     Breath sounds: Normal breath sounds. No stridor. No wheezing, rhonchi or rales.  Chest:     Chest wall: No tenderness.  Musculoskeletal:        General: No tenderness, deformity or signs of injury.     Right lower leg: Edema (pitting) present.     Left lower leg: Edema (pitting) present.  Skin:    General: Skin is warm.     Findings: No erythema.  Neurological:  Mental Status: She is alert. Mental status is at baseline.     Sensory: Sensory deficit (right leg numbness/pain) present.  Psychiatric:        Mood and Affect: Mood normal.        Behavior: Behavior normal.        Thought Content: Thought content normal.     Last CBC Lab Results  Component Value Date   WBC 6.3 05/19/2024   HGB 12.8 05/19/2024   HCT 39.1 05/19/2024   MCV 93.3 05/19/2024   MCH 30.5 05/19/2024   RDW 13.9 05/19/2024   PLT 276 05/19/2024   Last metabolic panel Lab Results  Component Value Date   GLUCOSE 82 05/19/2024   NA 139 05/19/2024   K 3.6 05/19/2024   CL 109 05/19/2024   CO2 23 05/19/2024   BUN 15 05/19/2024   CREATININE 0.70 05/19/2024   GFRNONAA >60 05/19/2024   CALCIUM 9.0 05/19/2024   PROT 7.2 05/19/2024   ALBUMIN 4.2 05/19/2024   LABGLOB 2.5 11/17/2023   AGRATIO 1.6 03/25/2023   BILITOT 0.3 05/19/2024   ALKPHOS 68 05/19/2024   AST 7 (L) 05/19/2024   ALT 5 05/19/2024   ANIONGAP 7 05/19/2024        Assessment & Plan  Problem List Items Addressed This Visit       Respiratory   Viral URI with cough - Primary   Patient's symptoms of productive cough and sore throat most concerning for viral URI. Her symptoms are improving over the 2 weeks she  has had them. Low suspicion for DVT/PE as leg swelling is bilateral, no erythema, consistent with patient's chronic conditions. Additionally, she is afebrile, normal HR, and does not have chest pain. Pt has been taking Eliquis  as prescribed, has not missed a dose. No further imaging is warranted at this time. Also considered PNA but there is a lack of positive exam findings and symptoms are more consistent with URI. -Tylenol  and OTC cough suppressants as needed -If cough persists >4 weeks, will consider further workup with PFTs        Return in about 2 months (around 08/17/2024) for Routine follow up/Healthcare maintenance.  Patient discussed with Dr. Jeanelle, who also saw and evaluated the patient.  Michele Kerlin, MD [facility]  PGY-1 06/16/2024, 11:08 AM

## 2024-06-15 NOTE — Telephone Encounter (Signed)
 Called [facility] and was able to find out the 7/3 labs are complete and they faxed the results to the office. Pt can proceed forward with her infusion. Infusion team also made aware. Orders are placed for the pt.

## 2024-06-16 ENCOUNTER — Ambulatory Visit

## 2024-06-16 VITALS — BP 130/84 | HR 56 | Temp 97.7°F | Ht 68.0 in

## 2024-06-16 DIAGNOSIS — J069 Acute upper respiratory infection, unspecified: Secondary | ICD-10-CM | POA: Diagnosis not present

## 2024-06-16 NOTE — Patient Instructions (Addendum)
 Thank you, [name] for allowing us  to provide your care today. Today we discussed the following:  -Your symptoms are likely due to a viral upper respiratory infection. Your condition should improve in 4 weeks on its own. - For comfort you can try Tylenol  or a combination of Tylenol  and cough suppressant that you can buy over the counter. - Please call the office if your symptoms do not improve after 4 weeks. - If your symptoms worsen or you experience chest pain, shortness of breath, or leg swelling, please go to the emergency room.  I have ordered the following labs for you:  Lab Orders  No laboratory test(s) ordered today     Tests ordered today:  None  Referrals ordered today:   Referral Orders  No referral(s) requested today     I have ordered the following medication/changed the following medications:   Stop the following medications: There are no discontinued medications.   Start the following medications: No orders of the defined types were placed in this encounter.    Follow up: in 2 months for routine follow up    Remember: IF YOU EXPERIENCE SHORTNESS OF BREATH, LEG SWELLING, OR CHEST PAIN PLEASE PRESENT TO THE EMERGENCY ROOM.  Should you have any questions or concerns please call the Internal Medicine Clinic at [phone].     Tommi Crepeau, MD [facility]

## 2024-06-16 NOTE — Assessment & Plan Note (Signed)
 Patient's symptoms of productive cough and sore throat most concerning for viral URI. Her symptoms are improving over the 2 weeks she has had them. Low suspicion for DVT/PE as leg swelling is bilateral, no erythema, consistent with patient's chronic conditions. Additionally, she is afebrile, normal HR, and does not have chest pain. Pt has been taking Eliquis  as prescribed, has not missed a dose. No further imaging is warranted at this time. Also considered PNA but there is a lack of positive exam findings and symptoms are more consistent with URI. -Tylenol  and OTC cough suppressants as needed -If cough persists >4 weeks, will consider further workup with PFTs

## 2024-06-19 ENCOUNTER — Ambulatory Visit: Payer: Medicare HMO | Admitting: Diagnostic Neuroimaging

## 2024-06-19 ENCOUNTER — Non-Acute Institutional Stay (HOSPITAL_COMMUNITY)
Admission: RE | Admit: 2024-06-19 | Discharge: 2024-06-19 | Disposition: A | Discharge status: Home / Self Care | Source: Ambulatory Visit | Attending: Internal Medicine

## 2024-06-19 DIAGNOSIS — R29898 Other symptoms and signs involving the musculoskeletal system: Secondary | ICD-10-CM | POA: Diagnosis not present

## 2024-06-19 DIAGNOSIS — E559 Vitamin D deficiency, unspecified: Secondary | ICD-10-CM | POA: Insufficient documentation

## 2024-06-19 DIAGNOSIS — D696 Thrombocytopenia, unspecified: Secondary | ICD-10-CM | POA: Diagnosis not present

## 2024-06-19 DIAGNOSIS — G35 Multiple sclerosis: Secondary | ICD-10-CM | POA: Diagnosis present

## 2024-06-19 DIAGNOSIS — Z79899 Other long term (current) drug therapy: Secondary | ICD-10-CM | POA: Insufficient documentation

## 2024-06-19 MED ORDER — METHYLPREDNISOLONE SODIUM SUCC 125 MG IJ SOLR
125.0000 mg | Freq: Once | INTRAMUSCULAR | Status: AC
  Administered 2024-06-19: 125 mg via INTRAVENOUS
  Filled 2024-06-19: qty 2

## 2024-06-19 MED ORDER — FAMOTIDINE IN NACL 20-0.9 MG/50ML-% IV SOLN
20.0000 mg | Freq: Once | INTRAVENOUS | Status: AC
  Administered 2024-06-19: 20 mg via INTRAVENOUS
  Filled 2024-06-19: qty 50

## 2024-06-19 MED ORDER — DIPHENHYDRAMINE HCL 50 MG/ML IJ SOLN
50.0000 mg | Freq: Once | INTRAMUSCULAR | Status: AC
  Administered 2024-06-19: 50 mg via INTRAVENOUS
  Filled 2024-06-19: qty 1

## 2024-06-19 MED ORDER — ACETAMINOPHEN 325 MG PO TABS
650.0000 mg | ORAL_TABLET | Freq: Once | ORAL | Status: AC
  Administered 2024-06-19: 650 mg via ORAL
  Filled 2024-06-19: qty 2

## 2024-06-19 MED ORDER — SODIUM CHLORIDE 0.9 % IV SOLN
600.0000 mg | Freq: Once | INTRAVENOUS | Status: AC
  Administered 2024-06-19: 600 mg via INTRAVENOUS
  Filled 2024-06-19: qty 20

## 2024-06-19 MED ORDER — SODIUM CHLORIDE 0.9 % IV SOLN: INTRAVENOUS | Status: DC | PRN

## 2024-06-19 NOTE — Progress Notes (Signed)
 PATIENT CARE CENTER NOTE     Diagnosis: Multiple Sclerosis G35     Provider: Margaret Carne, MD     Procedure: Ocrevus  infusion      Note: Patient received Ocrevus  600 mg infusion via PIV. Patient pre-medicated with PO Tylenol , IV Benadryl , IV Solumedrol and IVPB Pepcid  per order. Infusion titrated per protocol. Patient tolerated infusion well with no adverse reaction. Patient declined 1 hour observation post infusion.  Vital signs stable. AVS offered but patient refused. Patient to come back in 6 month for next infusion and will schedule next appointment at the front desk. Patient is alert, oriented and discharged in personal wheelchair.

## 2024-06-21 ENCOUNTER — Telehealth: Admitting: Gastroenterology

## 2024-06-21 ENCOUNTER — Encounter: Payer: Self-pay | Admitting: Oncology

## 2024-06-21 ENCOUNTER — Encounter: Payer: Self-pay | Admitting: *Deleted

## 2024-06-22 NOTE — Progress Notes (Signed)
 Internal Medicine Clinic Attending  I was physically present during the key portions of the resident provided service and participated in the medical decision making of patient's management care. I reviewed pertinent patient test results.  The assessment, diagnosis, and plan were formulated together and I agree with the documentation in the resident's note.  Carney Living, MD

## 2024-06-23 ENCOUNTER — Telehealth (INDEPENDENT_AMBULATORY_CARE_PROVIDER_SITE_OTHER): Admitting: Gastroenterology

## 2024-06-23 DIAGNOSIS — Z7901 Long term (current) use of anticoagulants: Secondary | ICD-10-CM

## 2024-06-23 DIAGNOSIS — R899 Unspecified abnormal finding in specimens from other organs, systems and tissues: Secondary | ICD-10-CM

## 2024-06-23 DIAGNOSIS — K862 Cyst of pancreas: Secondary | ICD-10-CM

## 2024-06-23 DIAGNOSIS — Z5181 Encounter for therapeutic drug level monitoring: Secondary | ICD-10-CM

## 2024-06-23 DIAGNOSIS — R194 Change in bowel habit: Secondary | ICD-10-CM | POA: Diagnosis not present

## 2024-06-23 DIAGNOSIS — R935 Abnormal findings on diagnostic imaging of other abdominal regions, including retroperitoneum: Secondary | ICD-10-CM

## 2024-06-23 DIAGNOSIS — K625 Hemorrhage of anus and rectum: Secondary | ICD-10-CM

## 2024-06-23 DIAGNOSIS — R978 Other abnormal tumor markers: Secondary | ICD-10-CM

## 2024-06-23 DIAGNOSIS — I2694 Multiple subsegmental pulmonary emboli without acute cor pulmonale: Secondary | ICD-10-CM

## 2024-06-23 NOTE — Progress Notes (Signed)
 GASTROENTEROLOGY OUTPATIENT CLINIC VISIT   Primary Care Provider Shawn Sick, MD [address] [place] [address] [phone]  Referring Provider Dr. Legrand   Patient Profile: Ebony Warren is a 48 y.o. female  with a pmh significant for multiple sclerosis, recent PE diagnosis (6/25 on anticoagulation) anxiety, MDD, arthritis, obesity, hypertension, constipation, pancreatic cyst.  The patient presents to the [facility] for an evaluation and management of problem(s) noted below:  Problem List 1. Pancreatic cyst   2. Abnormal MRI of abdomen   3. Elevated chromogranin A   4. Change in bowel habits   5. Rectal bleeding   6. Multiple subsegmental pulmonary emboli without acute cor pulmonale (HCC)   7. Chronic anticoagulation    Discussed the use of AI scribe software for clinical note transcription with the patient, who gave verbal consent to proceed.  History of Present Illness Please see prior GI notes for full details of HPI.  Interval History The patient is being seen for discussion of recently noted pancreatic cyst found on imaging after being found to have a PE.  She also has had some changes in bowel habits and recent rectal bleeding.  She is accompanied by her mother on today's EPIC visit.  She has experienced a change in bowel habits over the past few months, with stools becoming hard and resembling 'little pebbles.' This is a change from six months ago when her stools were hard but not pebble-like. She has not started any new medications in the last two to three months, except for the blood thinner and her symptoms predated the new medication started in June.  She has noticed blood in her stool since starting the blood thinner, which she attributes to a known hemorrhoid.  The rectal bleeding occurs with bowel movements, which happen every one to two days.  She has not had recent blood work since June and since her rectal bleeding  started.  She had been seen last year to consider Colon Cancer screening and colonoscopy, but due to issues requiring transfer and possible need for inpatient admission to the hospital, things were put on hold to re-evaluate this year by Dr. Legrand.  She does not walk and uses a wheelchair, transferring herself to a bedside chair for restroom use. She wears depends.   She has no history of pancreatitis.  There is no family history of pancreatic issues either.   GI Review of Systems Positive as above Negative for dysphagia, odynophagia, melena  Review of Systems General: Denies fevers/chills/weight loss unintentionally Cardiovascular: Denies chest pain Pulmonary: Denies shortness of breath Gastroenterological: See HPI Genitourinary: Denies darkened urine Hematological: Denies easy bruising/bleeding Dermatological: Denies jaundice Psychological: Mood is stable  Medications Current Outpatient Medications  Medication Sig Dispense Refill   acetaminophen  (TYLENOL ) 160 MG/5ML elixir Take 15 mg/kg by mouth every 4 (four) hours as needed for fever (OTC).     apixaban  (ELIQUIS ) 5 MG TABS tablet Take 1 tablet (5 mg total) by mouth 2 (two) times daily. 60 tablet 2   ARIPiprazole  (ABILIFY ) 5 MG tablet Take 5 mg by mouth daily.     baclofen  (LIORESAL ) 20 MG tablet Take 1 tablet (20 mg total) by mouth 3 (three) times daily. 270 tablet 4   cholecalciferol (VITAMIN D ) 1000 UNITS tablet Take 1,000 Units by mouth daily.     diphenhydrAMINE  (BENADRYL ) 25 mg capsule Take 25 mg by mouth every 6 (six) hours as needed for allergies.     DULoxetine  (CYMBALTA ) 30 MG  capsule Take 1 capsule by mouth daily.     LORazepam  (ATIVAN ) 1 MG tablet Take 1 tablet (1 mg total) by mouth daily. 14 tablet 0   losartan  (COZAAR ) 50 MG tablet Take 1 tablet (50 mg total) by mouth daily. 90 tablet 3   metFORMIN  (GLUCOPHAGE -XR) 500 MG 24 hr tablet Take 1 tablet (500 mg total) by mouth 2 (two) times daily with a meal. 180 tablet 0    Multiple Vitamins-Minerals (MULTIVITAMIN ADULT PO) Take 1 tablet by mouth daily.     nystatin  (MYCOSTATIN /NYSTOP ) powder Apply 1 Application topically 2 (two) times daily. 60 g 1   ocrelizumab  (OCREVUS ) 300 MG/10ML injection Infuse 600 mg IV Every 6 months 20 mL 1   oxybutynin  (DITROPAN -XL) 10 MG 24 hr tablet Take 1 tablet (10 mg total) by mouth daily as needed (to prevent incontinence). 90 tablet 3   pantoprazole  (PROTONIX ) 40 MG tablet Take 1 tablet (40 mg total) by mouth daily. 90 tablet 3   polyethylene glycol powder (GLYCOLAX /MIRALAX ) 17 GM/SCOOP powder Take 17 g by mouth 2 (two) times daily as needed. 765 g 1   Potassium (POTASSIMIN PO) Take by mouth daily at 12 noon.     tiZANidine  (ZANAFLEX ) 4 MG tablet Take 1 tablet (4 mg total) by mouth 3 (three) times daily. 270 tablet 4   topiramate  (TOPAMAX ) 50 MG tablet Take 1 tablet (50 mg total) by mouth 2 (two) times daily. 180 tablet 4   Current Facility-Administered Medications  Medication Dose Route Frequency Provider Last Rate Last Admin   diphenhydrAMINE  (BENADRYL ) injection 50 mg  50 mg Intravenous Once Penumalli, Vikram R, MD       famotidine  (PEPCID ) 20 mg in sodium chloride  0.9 % 50 mL IVPB  20 mg Intravenous Once Penumalli, Vikram R, MD       methylPREDNISolone  sodium succinate (SOLU-MEDROL ) 125 mg in sodium chloride  0.9 % 50 mL IVPB  125 mg Intravenous Once Penumalli, Vikram R, MD       ocrelizumab  (OCREVUS ) 600 mg in sodium chloride  0.9 % 250 mL  600 mg Intravenous Once Penumalli, Vikram R, MD        Allergies Allergies  Allergen Reactions   Bioflavonoids Hives   Oxycodone -Acetaminophen  Itching   Hydrocodone  Itching    Stomach cramps   Morphine  Itching   Orange Fruit [Citrus]     Acidic foods Makes her itchy and her face feels like its on fire, have rash.   Oxycodone  Itching    rash   Tomato Hives   Other Rash    Narcotics. Spicy foods (cause seizures)    Histories Past Medical History:  Diagnosis Date   ADD  (attention deficit disorder)    Anxiety and depression    Arthritis    Bradycardia    Constipation    Edema of both lower extremities    Encounter to establish care 01/22/2021   Family history of adverse reaction to anesthesia    nausea   Food allergy    Gallbladder problem    GERD (gastroesophageal reflux disease)    Headache    Hypertension    Multiple sclerosis (HCC)    Obesity    Osteoarthritis    Screening mammogram, encounter for 01/22/2021   Seizures (HCC)    told that they are not from the brain but stress related   Swelling of lower extremity 03/24/2023   Vitamin D  deficiency    Past Surgical History:  Procedure Laterality Date   BREAST REDUCTION SURGERY Bilateral 06/23/2021  Procedure: MAMMARY REDUCTION  (BREAST);  Surgeon: Elisabeth Craig RAMAN, MD;  Location: [facility] OR;  Service: Plastics;  Laterality: Bilateral;   CHOLECYSTECTOMY     KNEE SURGERY Bilateral    Arthroscopy   Social History   Socioeconomic History   Marital status: Single    Spouse name: Not on file   Number of children: 1   Years of education: College   Highest education level: Not on file  Occupational History    Employer: OTHER    Comment: disabled  Tobacco Use   Smoking status: Former    Current packs/day: 0.00    Average packs/day: 0.4 packs/day for 3.0 years (1.1 ttl pk-yrs)    Types: Cigarettes    Start date: 12/08/2003    Quit date: 12/07/2006    Years since quitting: 17.5   Smokeless tobacco: Never  Vaping Use   Vaping status: Never Used  Substance and Sexual Activity   Alcohol use: No    Alcohol/week: 0.0 standard drinks of alcohol    Comment: quit: 2013 (socially)   Drug use: Not Currently    Types: Marijuana    Comment: Smokes approx 1-2 per day   Sexual activity: Not Currently  Other Topics Concern   Not on file  Social History Narrative   ** Merged History Encounter **       Patient lives at home with family. Caffeine Use: drinks a cup of hot tea with tecfidera  in the  am Patient is right handed. Patient has a college education   Social Drivers of Corporate investment banker Strain: Low Risk  (06/07/2024)   Overall Financial Resource Strain (CARDIA)    Difficulty of Paying Living Expenses: Not hard at all  Food Insecurity: No Food Insecurity (06/07/2024)   Hunger Vital Sign    Worried About Running Out of Food in the Last Year: Never true    Ran Out of Food in the Last Year: Never true  Transportation Needs: No Transportation Needs (06/07/2024)   PRAPARE - Administrator, Civil Service (Medical): No    Lack of Transportation (Non-Medical): No  Physical Activity: Inactive (06/07/2024)   Exercise Vital Sign    Days of Exercise per Week: 0 days    Minutes of Exercise per Session: 0 min  Stress: No Stress Concern Present (06/07/2024)   [employer] of Occupational Health - Occupational Stress Questionnaire    Feeling of Stress: Not at all  Social Connections: Socially Integrated (06/07/2024)   Social Connection and Isolation Panel    Frequency of Communication with Friends and Family: More than three times a week    Frequency of Social Gatherings with Friends and Family: More than three times a week    Attends Religious Services: More than 4 times per year    Active Member of [employer] or Organizations: Yes    Attends Engineer, structural: More than 4 times per year    Marital Status: Married  Catering manager Violence: Not At Risk (06/07/2024)   Humiliation, Afraid, Rape, and Kick questionnaire    Fear of Current or Ex-Partner: No    Emotionally Abused: No    Physically Abused: No    Sexually Abused: No   Family History  Problem Relation Age of Onset   Hypertension Mother    Atrial fibrillation Mother    Thyroid  disease Mother    Endometrial cancer Mother    Obesity Mother    Colon polyps Mother    Hypertension Father  Atrial fibrillation Maternal Grandmother    Lung cancer Maternal Grandfather    Colon cancer Neg Hx     Esophageal cancer Neg Hx    Inflammatory bowel disease Neg Hx    Liver disease Neg Hx    Pancreatic cancer Neg Hx    Rectal cancer Neg Hx    Stomach cancer Neg Hx    I have reviewed her medical, social, and family history in detail and updated the electronic medical record as necessary.    PHYSICAL EXAMINATION  There were no vitals taken for this visit. Wt Readings from Last 3 Encounters:  06/07/24 229 lb (103.9 kg)  05/18/24 229 lb (103.9 kg)  04/27/24 229 lb (103.9 kg)  Telemedicine visit/Caregility   REVIEW OF DATA  I reviewed the following data at the time of this encounter:  GI Procedures and Studies  No relevant studies to review  Laboratory Studies  Reviewed those in epic  Imaging Studies  June 2025 CT chest IMPRESSION: 1. Small volume segmental and subsegmental pulmonary embolus in the right lower lobe. No evidence for right heart strain or overt pulmonary infarct. 2. 1.9 cm hypoattenuating lesion in the tail the pancreas. MRI abdomen with and without contrast recommended to further evaluate. 3. 4 mm anterior left lung nodule. No follow-up needed if patient is low-risk.This recommendation follows the consensus statement: Guidelines for Management of Incidental Pulmonary Nodules Detected on CT Images: From the Fleischner Society 2017; Radiology 2017; 284:228-243.  June 2025 MRI abdomen IMPRESSION: 1. 1.8 by 1.9 by 1.5 cm cystic lesion along the inferior margin of the tail the pancreas with 1-2 internal septa but no internal nodularity and no appreciable enhancement. No connection to the dorsal pancreatic duct, and no dorsal pancreatic duct dilatation observed possibilities include postinflammatory cystic lesion or intraductal papillary mucinous neoplasm. Current guidelines recommend either endoscopic ultrasound with fine-needle aspiration at this point, or follow up pancreatic protocol MRI with and without contrast in 6 months time. This recommendation  follows ACR consensus guidelines: Management of Incidental Pancreatic Cysts: A White Paper of the ACR Incidental Findings Committee. J Am Coll Radiol 2017;14:911-923. 2. Trace bilateral pleural effusions. 3. Prominent stool throughout the colon favors constipation.   ASSESSMENT  Ms. Zeiter is a 48 y.o. female with a pmh significant for multiple sclerosis, recent PE diagnosis (6/25 on anticoagulation) anxiety, MDD, arthritis, obesity, hypertension, constipation, pancreatic cyst. The patient is seen today for evaluation and management of:  1. Pancreatic cyst   2. Abnormal MRI of abdomen   3. Elevated chromogranin A   4. Change in bowel habits   5. Rectal bleeding   6. Multiple subsegmental pulmonary emboli without acute cor pulmonale (HCC)   7. Chronic anticoagulation    The patient is hemodynamically stable.  From the pancreas cyst perspective, review of her imaging shows no significant red flag symptoms and this is the first time that this has been noted, without prior cross-sectional imaging being present, this type of cyst seems less likely to be a malignant type of cyst.  She did have an elevated chromogranin A level (though is on PPI therapy) but a normal CA 19-9.  Very rarely, there are neuroendocrine tumors that can be cystic like.  She just had initiation of anticoagulation in June as a result of the finding of the pulmonary embolism.  Overall, I feel that this lesion requires close monitoring but is not clear that she needs an needle aspiration at this time.  I would like her to repeat a  chromogranin a after being off of PPI for 7 to 10 days.  If this remains elevated, pretest concern may be slightly higher that we will need to consider an endoscopic ultrasound, but again feel this is more likely associated with PPI therapy.  Would consider the role of potential dotatate imaging if chromogranin A remain significantly elevated.  For now we will plan an MRI/MRCP in approximately 4  months from the date of her imaging study in June.  I do think the issue that I have more concerned about for which she will need to discuss things further with her primary gastroenterologist Dr. Legrand, is the small episodes of rectal bleeding since the initiation of her anticoagulation.  Today's visit was a MyChart/Caregility, so no ability to evaluate rectum at this time.  She was previously discussed for potential colon cancer screening but due to her multiple sclerosis and transfer issues, it was felt that she may need inpatient admission, which was not possible last year.  I am going to forward this note as well as my concerns in regards to the rectal bleeding to Dr. Legrand that he may have a chance to review and consider further workup and/or further evaluation in clinic to evaluate the episodes of rectal bleeding.  I will repeat a CBC at the time that she comes in for chromogranin A.  The risks of an EUS including intestinal perforation, bleeding, infection, aspiration, and medication effects were discussed as was the possibility it may not give a definitive diagnosis if a biopsy is performed.  When a biopsy of the pancreas is done as part of the EUS, there is an additional risk of pancreatitis at the rate of about 1-2%.  It was explained that procedure related pancreatitis is typically mild, although it can be severe and even life threatening, which is why we do not perform random pancreatic biopsies and only biopsy a lesion/area we feel is concerning enough to warrant the risk.  Hopefully we will not need the EUS.  All patient questions were answered to the best of my ability, and the patient agrees to the aforementioned plan of action with follow-up as indicated.   PLAN  Tentative plan for MRI/MRCP in 10/25 to follow-up pancreatic cyst Chromogranin A level to be obtained (after 7 to 10 days off of PPI) Repeat CBC at time of follow-up labs for rectal bleeding Will discuss patient's recent changes in  bowel habits and rectal bleeding with Dr. Legrand, her primary gastroenterologist for further review and consideration of evaluation versus need for endoscopic evaluation (complicated as a result of her multiple sclerosis)   Orders Placed This Encounter  Procedures   Chromogranin A   CBC    New Prescriptions   No medications on file   Modified Medications   No medications on file    Planned Follow Up No follow-ups on file.   Total Time in Face-to-Face and in Coordination of Care for patient including independent/personal interpretation/review of prior testing, medical history, examination, medication adjustment, communicating results with the patient directly, and documentation within the EHR is 30 minutes.   Aloha Finner, MD [facility] Office # [phone]

## 2024-06-23 NOTE — Patient Instructions (Signed)
 Hold Protonix  starting 06/24/24, until after you have labs drawn the week of 07/03/24. You may resume Protonix  after that.   Your procedure has been canceled for 08/14/24.   Recall has been placed for MRI/MRCP in 09/2024. You will be contacted to schedule.   Dr.Mansouraty will speak with Dr Legrand regarding other symptoms discussed today.   _______________________________________________________  If your blood pressure at your visit was 140/90 or greater, please contact your primary care physician to follow up on this.  _______________________________________________________  If you are age 63 or older, your body mass index should be between 23-30. Your There is no height or weight on file to calculate BMI. If this is out of the aforementioned range listed, please consider follow up with your Primary Care Provider.  If you are age 77 or younger, your body mass index should be between 19-25. Your There is no height or weight on file to calculate BMI. If this is out of the aformentioned range listed, please consider follow up with your Primary Care Provider.   ________________________________________________________  The [facility] providers would like to encourage you to use MYCHART to communicate with providers for non-urgent requests or questions.  Due to long hold times on the telephone, sending your provider a message by [facility] may be a faster and more efficient way to get a response.  Please allow 48 business hours for a response.  Please remember that this is for non-urgent requests.  _______________________________________________________

## 2024-06-25 ENCOUNTER — Encounter: Payer: Self-pay | Admitting: Gastroenterology

## 2024-06-25 DIAGNOSIS — R899 Unspecified abnormal finding in specimens from other organs, systems and tissues: Secondary | ICD-10-CM | POA: Insufficient documentation

## 2024-06-25 DIAGNOSIS — R935 Abnormal findings on diagnostic imaging of other abdominal regions, including retroperitoneum: Secondary | ICD-10-CM | POA: Insufficient documentation

## 2024-06-25 DIAGNOSIS — K625 Hemorrhage of anus and rectum: Secondary | ICD-10-CM | POA: Insufficient documentation

## 2024-06-25 DIAGNOSIS — Z7901 Long term (current) use of anticoagulants: Secondary | ICD-10-CM | POA: Insufficient documentation

## 2024-06-25 DIAGNOSIS — R194 Change in bowel habit: Secondary | ICD-10-CM | POA: Insufficient documentation

## 2024-06-26 ENCOUNTER — Ambulatory Visit (INDEPENDENT_AMBULATORY_CARE_PROVIDER_SITE_OTHER): Admitting: Internal Medicine

## 2024-06-27 ENCOUNTER — Ambulatory Visit (INDEPENDENT_AMBULATORY_CARE_PROVIDER_SITE_OTHER): Admitting: Internal Medicine

## 2024-06-27 ENCOUNTER — Telehealth: Payer: Self-pay

## 2024-06-27 NOTE — Telephone Encounter (Signed)
-----   Message from Victory LITTIE Brand III sent at 06/26/2024  4:02 PM EDT ----- This is a patient of mine with limited mobility from multiple sclerosis.  She has a change in bowel habits with more recent onset of rectal bleeding.  Please let me know when the October schedule is out and when my [facility] or [facility] outpatient block.  Then we can coordinate a colonoscopy on this patient with an admission the day before for bowel prep.  - H. Danis

## 2024-06-28 ENCOUNTER — Encounter (INDEPENDENT_AMBULATORY_CARE_PROVIDER_SITE_OTHER): Payer: Self-pay | Admitting: Internal Medicine

## 2024-06-28 ENCOUNTER — Ambulatory Visit (INDEPENDENT_AMBULATORY_CARE_PROVIDER_SITE_OTHER): Admitting: Internal Medicine

## 2024-06-28 VITALS — BP 127/84 | HR 72 | Temp 98.2°F | Ht 68.0 in | Wt 226.0 lb

## 2024-06-28 DIAGNOSIS — K862 Cyst of pancreas: Secondary | ICD-10-CM

## 2024-06-28 DIAGNOSIS — E66811 Obesity, class 1: Secondary | ICD-10-CM | POA: Diagnosis not present

## 2024-06-28 DIAGNOSIS — Z6834 Body mass index (BMI) 34.0-34.9, adult: Secondary | ICD-10-CM | POA: Diagnosis not present

## 2024-06-28 DIAGNOSIS — G35 Multiple sclerosis: Secondary | ICD-10-CM | POA: Diagnosis not present

## 2024-06-28 DIAGNOSIS — E88819 Insulin resistance, unspecified: Secondary | ICD-10-CM

## 2024-06-28 DIAGNOSIS — I2694 Multiple subsegmental pulmonary emboli without acute cor pulmonale: Secondary | ICD-10-CM

## 2024-06-28 DIAGNOSIS — I10 Essential (primary) hypertension: Secondary | ICD-10-CM

## 2024-06-28 MED ORDER — METFORMIN HCL ER 500 MG PO TB24: 500.0000 mg | ORAL_TABLET | Freq: Two times a day (BID) | ORAL | 0 refills | Status: DC

## 2024-06-28 NOTE — Progress Notes (Signed)
 Office: [phone]  /  Fax: [phone]  Weight Summary and Body Composition Analysis (BIA)  Vitals Temp: 98.2 F (36.8 C) BP: 127/84 Pulse Rate: 72 SpO2: 96 %   Anthropometric Measurements Height: 5' 8 (1.727 m) Weight: 226 lb (102.5 kg) BMI (Calculated): 34.37 Weight at Last Visit: 229 lb Weight Lost Since Last Visit: 3 lb Weight Gained Since Last Visit: 0 lb Starting Weight: 309 lb Total Weight Loss (lbs): 83 lb (37.6 kg) Peak Weight: 312 lb   No data recorded  RMR: 1426  Today's Visit #: 18  Starting Date: 02/22/23   Subjective   Chief Complaint: Obesity  Interval History Discussed the use of AI scribe software for clinical note transcription with the patient, who gave verbal consent to proceed.  History of Present Illness   Ebony Warren is a 48 year old female with hypertension, insulin  resistance, and multiple sclerosis who presents for medical weight management.  She has successfully lost 83 pounds, approximately 28% of her peak weight of 312 pounds. Her recent physical activity has been limited due to a blood clot that occurred on June 12, impacting her ability to exercise.  She has a history of hypertension and insulin  resistance, managed with metformin  XR, one tablet twice a day, with no reported side effects.  Her multiple sclerosis is associated with hemiparesis, which she feels is worsening, particularly affecting her right leg, which she can barely move. She experiences occasional 'MS hugs' and has lost bladder control, necessitating the use of diapers, although she can still empty her bladder. She takes Cystex every other day to prevent UTIs and bladder infections. No pain is experienced when her bladder is full, and catheterization has not been recommended.  She experienced an upper respiratory infection following a blood clot, which lasted about three weeks but has since resolved. She is currently on Eliquis  for the blood clot,  with a treatment duration of three to six months.  Her motorized chair is broken, with a front left wheel and part of the frame needing repair, impacting her mobility. The repair could take two to six months.  She is no longer taking pantoprazole  and reports no issues with heartburn. She continues to take baclofen .  She has found a way to reduce swelling in her legs by sitting with her legs in a figure-four position. No blisters or sores are present on her legs or back, and her feet are regularly checked and moisturized.       Challenges affecting patient progress: orthopedic problems, medical conditions or chronic pain affecting mobility.    Pharmacotherapy for weight management: She is currently taking Metformin  (off label use for incretin effect and / or insulin  resistance and / or diabetes prevention) with adequate clinical response  and without side effects. and Topiramate  (off label use, single agent) with adequate clinical response  and without side effects..   Assessment and Plan   Treatment Plan For Obesity:  Recommended Dietary Goals  Ebony Warren is currently in the action stage of change. As such, her goal is to continue weight management plan. She has agreed to: continue current plan  Behavioral Health and Counseling  We discussed the following behavioral modification strategies today: continue to work on maintaining a reduced calorie state, getting the recommended amount of protein, incorporating whole foods, making healthy choices, staying well hydrated and practicing mindfulness when eating..  Additional education and resources provided today: None  Recommended Physical Activity Goals  Ebony Warren has been advised to work up to  150 minutes of moderate intensity aerobic activity a week and strengthening exercises 2-3 times per week for cardiovascular health, weight loss maintenance and preservation of muscle mass.   She has agreed to :  Think about enjoyable ways to  increase daily physical activity and overcoming barriers to exercise and Increase physical activity in their day and reduce sedentary time (increase NEAT).  Medical Interventions and Pharmacotherapy  We discussed various medication options to help Ebony Warren with her weight loss efforts and we both agreed to : Adequate clinical response to anti-obesity medication, continue current regimen  Associated Conditions Impacted by Obesity Treatment  Assessment & Plan Insulin  resistance  Essential hypertension  Multiple sclerosis (HCC)  Class 1 obesity with serious comorbidity and body mass index (BMI) of 34.0 to 34.9 in adult, unspecified obesity type  Pancreatic cyst  Multiple subsegmental pulmonary emboli without acute cor pulmonale (HCC)     Assessment and Plan    Medical Weight Management She has lost 83 pounds, which is 28% of her peak weight of 312 pounds. This weight loss is comparable to outcomes seen in patients with gastric bypass (30%) and exceeds those on medications like Wegovy  or Zepbound (15-20%). She is currently on metformin  XR to manage insulin  resistance and offset weight gain from medications. No challenges reported with current nutrition plan, but physical activity was limited due to a recent blood clot. Plans to gradually resume physical activity as advised. - Continue metformin  XR, one tablet twice a day - Refill metformin  XR with a 90-day supply via mail service - Gradually resume physical activity - Monitor diet every 4-8 weeks to ensure adequate fiber and protein intake - Use meal replacements if protein intake is insufficient  Multiple Sclerosis with Hemiparesis She reports worsening of MS symptoms, particularly in the right leg, which she can barely move. She also experiences occasional MS hugs and has lost bladder control, requiring diapers. No issues with speech, swallowing, or vision. No recommendation for catheterization, but potential risk for bladder  infections due to incomplete emptying. Takes Cystex to prevent UTIs. - Consider referral to a urologist for bladder management and potential catheterization training - Continue current MS management and monitor symptoms  Insulin  Resistance She is on metformin  XR to manage insulin  resistance. Last A1c in December was 5.2, indicating good control. No side effects from metformin  reported.  Hypertension Blood pressure is currently well-controlled.  Blood Clot She had a blood clot and is currently on Eliquis . Follow-up with a blood specialist occurred recently, and the duration of anticoagulation is expected to be 3-6 months.  Pancreatic Cyst A small pancreatic cyst is being monitored with MRI. No biopsy was performed due to its small size. Next MRI is scheduled for October, with a blood test next week.  Pressure Sore Prevention She is advised to maintain adequate protein intake and shift weight regularly to prevent pressure sores due to prolonged sitting. - Ensure adequate protein intake - Shift weight regularly to prevent pressure sores  Follow-up She plans to return for follow-up in four weeks. - Schedule follow-up appointment in four weeks        Objective   Physical Exam:  Blood pressure 127/84, pulse 72, temperature 98.2 F (36.8 C), height 5' 8 (1.727 m), weight 226 lb (102.5 kg), SpO2 96%. Body mass index is 34.36 kg/m.  General: She is overweight, cooperative, alert, well developed, and in no acute distress. PSYCH: Has normal mood, affect and thought process.   HEENT: EOMI, sclerae are anicteric. Lungs: Normal breathing  effort, no conversational dyspnea. Extremities: No edema.  Neurologic: No gross sensory or motor deficits. No tremors or fasciculations noted.    Diagnostic Data Reviewed:  BMET    Component Value Date/Time   NA 139 05/19/2024 1455   NA 142 11/17/2023 0917   K 3.6 05/19/2024 1455   CL 109 05/19/2024 1455   CO2 23 05/19/2024 1455   GLUCOSE 82  05/19/2024 1455   BUN 15 05/19/2024 1455   BUN 12 11/17/2023 0917   CREATININE 0.70 05/19/2024 1455   CALCIUM 9.0 05/19/2024 1455   GFRNONAA >60 05/19/2024 1455   GFRAA 87 12/10/2020 1251   Lab Results  Component Value Date   HGBA1C 5.2 11/26/2023   HGBA1C 5.3 08/19/2022   Lab Results  Component Value Date   INSULIN  4.8 11/17/2023   INSULIN  25.0 (H) 02/23/2023   Lab Results  Component Value Date   TSH 1.690 02/23/2023   CBC    Component Value Date/Time   WBC 6.3 05/19/2024 1455   WBC 5.4 05/18/2024 0436   RBC 4.19 05/19/2024 1455   HGB 12.8 05/19/2024 1455   HGB 13.9 01/04/2024 1352   HCT 39.1 05/19/2024 1455   HCT 42.1 01/04/2024 1352   PLT 276 05/19/2024 1455   PLT 291 01/04/2024 1352   MCV 93.3 05/19/2024 1455   MCV 95 01/04/2024 1352   MCH 30.5 05/19/2024 1455   MCHC 32.7 05/19/2024 1455   RDW 13.9 05/19/2024 1455   RDW 13.0 01/04/2024 1352   Iron Studies    Component Value Date/Time   IRON 68 10/27/2022 1056   TIBC 313 10/27/2022 1056   FERRITIN 41 10/27/2022 1056   IRONPCTSAT 22 10/27/2022 1056   Lipid Panel     Component Value Date/Time   CHOL 149 02/23/2023 1305   TRIG 72 02/23/2023 1305   HDL 50 02/23/2023 1305   LDLCALC 85 02/23/2023 1305   Hepatic Function Panel     Component Value Date/Time   PROT 7.2 05/19/2024 1455   PROT 6.7 11/17/2023 0917   ALBUMIN 4.2 05/19/2024 1455   ALBUMIN 4.2 11/17/2023 0917   AST 7 (L) 05/19/2024 1455   ALT 5 05/19/2024 1455   ALKPHOS 68 05/19/2024 1455   BILITOT 0.3 05/19/2024 1455      Component Value Date/Time   TSH 1.690 02/23/2023 1305   Nutritional Lab Results  Component Value Date   VD25OH 55.6 02/23/2023   VD25OH 38.1 09/23/2021   VD25OH 34.1 12/22/2016    Medications: Outpatient Encounter Medications as of 06/28/2024  Medication Sig Note   acetaminophen  (TYLENOL ) 160 MG/5ML elixir Take 15 mg/kg by mouth every 4 (four) hours as needed for fever (OTC).    apixaban  (ELIQUIS ) 5 MG TABS  tablet Take 1 tablet (5 mg total) by mouth 2 (two) times daily.    ARIPiprazole  (ABILIFY ) 5 MG tablet Take 5 mg by mouth daily.    baclofen  (LIORESAL ) 20 MG tablet Take 1 tablet (20 mg total) by mouth 3 (three) times daily.    cholecalciferol (VITAMIN D ) 1000 UNITS tablet Take 1,000 Units by mouth daily.    diphenhydrAMINE  (BENADRYL ) 25 mg capsule Take 25 mg by mouth every 6 (six) hours as needed for allergies.    DULoxetine  (CYMBALTA ) 30 MG capsule Take 1 capsule by mouth daily.    LORazepam  (ATIVAN ) 1 MG tablet Take 1 tablet (1 mg total) by mouth daily.    losartan  (COZAAR ) 50 MG tablet Take 1 tablet (50 mg total) by mouth daily.  metFORMIN  (GLUCOPHAGE -XR) 500 MG 24 hr tablet Take 1 tablet (500 mg total) by mouth 2 (two) times daily with a meal.    Multiple Vitamins-Minerals (MULTIVITAMIN ADULT PO) Take 1 tablet by mouth daily.    nystatin  (MYCOSTATIN /NYSTOP ) powder Apply 1 Application topically 2 (two) times daily.    ocrelizumab  (OCREVUS ) 300 MG/10ML injection Infuse 600 mg IV Every 6 months 06/10/2021: 06/10/21 Medicare PArt D approved 12/07/20 - 06/10/2022.     oxybutynin  (DITROPAN -XL) 10 MG 24 hr tablet Take 1 tablet (10 mg total) by mouth daily as needed (to prevent incontinence).    pantoprazole  (PROTONIX ) 40 MG tablet Take 1 tablet (40 mg total) by mouth daily.    polyethylene glycol powder (GLYCOLAX /MIRALAX ) 17 GM/SCOOP powder Take 17 g by mouth 2 (two) times daily as needed.    Potassium (POTASSIMIN PO) Take by mouth daily at 12 noon.    tiZANidine  (ZANAFLEX ) 4 MG tablet Take 1 tablet (4 mg total) by mouth 3 (three) times daily.    topiramate  (TOPAMAX ) 50 MG tablet Take 1 tablet (50 mg total) by mouth 2 (two) times daily.    Facility-Administered Encounter Medications as of 06/28/2024  Medication   diphenhydrAMINE  (BENADRYL ) injection 50 mg   famotidine  (PEPCID ) 20 mg in sodium chloride  0.9 % 50 mL IVPB   methylPREDNISolone  sodium succinate (SOLU-MEDROL ) 125 mg in sodium chloride  0.9 %  50 mL IVPB   ocrelizumab  (OCREVUS ) 600 mg in sodium chloride  0.9 % 250 mL     Follow-Up   No follow-ups on file.SABRA She was informed of the importance of frequent follow up visits to maximize her success with intensive lifestyle modifications for her multiple health conditions.  Attestation Statement   Reviewed by clinician on day of visit: allergies, medications, problem list, medical history, surgical history, family history, social history, and previous encounter notes.     Lucas Parker, MD

## 2024-06-30 ENCOUNTER — Ambulatory Visit (HOSPITAL_BASED_OUTPATIENT_CLINIC_OR_DEPARTMENT_OTHER): Admitting: Radiology

## 2024-07-03 ENCOUNTER — Encounter (HOSPITAL_BASED_OUTPATIENT_CLINIC_OR_DEPARTMENT_OTHER): Payer: Self-pay | Admitting: Radiology

## 2024-07-03 ENCOUNTER — Ambulatory Visit (HOSPITAL_BASED_OUTPATIENT_CLINIC_OR_DEPARTMENT_OTHER)
Admission: RE | Admit: 2024-07-03 | Discharge: 2024-07-03 | Disposition: A | Discharge status: Home / Self Care | Source: Ambulatory Visit | Attending: Internal Medicine | Admitting: Internal Medicine

## 2024-07-03 DIAGNOSIS — Z1231 Encounter for screening mammogram for malignant neoplasm of breast: Secondary | ICD-10-CM | POA: Diagnosis not present

## 2024-07-03 NOTE — Telephone Encounter (Signed)
 Please see my most recent telephone note on this stream for this patient who needs to be admitted to [facility] for bowel preparation the day before a colonoscopy.  If my schedule for Thursday, September 11 has a slot open (please check with scheduling), then that would be the optimal date.  If that block is full, then the next date I can do it would be Thursday, October 30.  My October 11 Darryle Long date is a Monday, which will not allow an admission the day prior.  Whichever day she gets scheduled for, she needs to take her last dose of Eliquis  in the morning 2 days prior to that.  Please contact the admitting office to hold a bed for her the day prior to the procedure date.  She will be contacted by the admitting office that day when the bed is available for her to proceed to [facility] for admission.  She would be admitted to our service if the bed is available during the day, and admitted to the hospitalist service if it is after hours.  (Please check with Tylene if any questions on this)  VEAR Brand MD

## 2024-07-04 ENCOUNTER — Encounter: Payer: Self-pay | Admitting: Diagnostic Neuroimaging

## 2024-07-05 ENCOUNTER — Other Ambulatory Visit: Payer: Self-pay

## 2024-07-05 ENCOUNTER — Encounter (HOSPITAL_COMMUNITY): Payer: Self-pay

## 2024-07-05 DIAGNOSIS — K625 Hemorrhage of anus and rectum: Secondary | ICD-10-CM

## 2024-07-05 DIAGNOSIS — R194 Change in bowel habit: Secondary | ICD-10-CM

## 2024-07-05 NOTE — Telephone Encounter (Signed)
 Colon has been entered for 9/11 1145 am Dr Legrand GLENN at [facility] she is to be admitted the day prior. I will call bed control [phone] and notify the app at that time.   Last does of Eliquis  on 08/15/24   Bed control will call pt on 9/10 when bed is available.  Pt advised to expect a call from [facility].  She has been advised to stop Eliquis  on 9/9.

## 2024-07-07 ENCOUNTER — Other Ambulatory Visit (INDEPENDENT_AMBULATORY_CARE_PROVIDER_SITE_OTHER)

## 2024-07-07 DIAGNOSIS — K625 Hemorrhage of anus and rectum: Secondary | ICD-10-CM

## 2024-07-07 DIAGNOSIS — R194 Change in bowel habit: Secondary | ICD-10-CM

## 2024-07-07 LAB — CBC
HCT: 41.6 % (ref 36.0–46.0)
Hemoglobin: 13.6 g/dL (ref 12.0–15.0)
MCHC: 32.6 g/dL (ref 30.0–36.0)
MCV: 93.7 fl (ref 78.0–100.0)
Platelets: 267 K/uL (ref 150.0–400.0)
RBC: 4.45 Mil/uL (ref 3.87–5.11)
RDW: 14.3 % (ref 11.5–15.5)
WBC: 4.7 K/uL (ref 4.0–10.5)

## 2024-07-08 ENCOUNTER — Ambulatory Visit: Payer: Self-pay | Admitting: Gastroenterology

## 2024-07-09 LAB — CHROMOGRANIN A: Chromogranin A (ng/mL): 51.3 ng/mL (ref 0.0–101.8)

## 2024-07-10 ENCOUNTER — Ambulatory Visit: Admitting: Adult Health

## 2024-07-13 DIAGNOSIS — F330 Major depressive disorder, recurrent, mild: Secondary | ICD-10-CM | POA: Diagnosis not present

## 2024-07-13 DIAGNOSIS — F411 Generalized anxiety disorder: Secondary | ICD-10-CM | POA: Diagnosis not present

## 2024-07-27 ENCOUNTER — Ambulatory Visit (INDEPENDENT_AMBULATORY_CARE_PROVIDER_SITE_OTHER): Admitting: Internal Medicine

## 2024-07-28 ENCOUNTER — Ambulatory Visit: Admitting: Gastroenterology

## 2024-07-31 DIAGNOSIS — G35 Multiple sclerosis: Secondary | ICD-10-CM | POA: Diagnosis not present

## 2024-07-31 DIAGNOSIS — M6281 Muscle weakness (generalized): Secondary | ICD-10-CM | POA: Diagnosis not present

## 2024-08-01 ENCOUNTER — Ambulatory Visit (INDEPENDENT_AMBULATORY_CARE_PROVIDER_SITE_OTHER): Admitting: Internal Medicine

## 2024-08-01 ENCOUNTER — Encounter (INDEPENDENT_AMBULATORY_CARE_PROVIDER_SITE_OTHER): Payer: Self-pay | Admitting: Internal Medicine

## 2024-08-01 VITALS — BP 128/82 | HR 61 | Ht 68.0 in | Wt 227.0 lb

## 2024-08-01 DIAGNOSIS — R600 Localized edema: Secondary | ICD-10-CM | POA: Diagnosis not present

## 2024-08-01 DIAGNOSIS — Z6834 Body mass index (BMI) 34.0-34.9, adult: Secondary | ICD-10-CM | POA: Insufficient documentation

## 2024-08-01 DIAGNOSIS — E88819 Insulin resistance, unspecified: Secondary | ICD-10-CM | POA: Diagnosis not present

## 2024-08-01 DIAGNOSIS — E669 Obesity, unspecified: Secondary | ICD-10-CM | POA: Insufficient documentation

## 2024-08-01 DIAGNOSIS — I10 Essential (primary) hypertension: Secondary | ICD-10-CM | POA: Diagnosis not present

## 2024-08-01 NOTE — Progress Notes (Signed)
 Office: [phone]  /  Fax: [phone]  Weight Summary and Body Composition Analysis (BIA)  Vitals BP: 128/82 Pulse Rate: 61 SpO2: 100 %   Anthropometric Measurements Height: 5' 8 (1.727 m) Weight: 227 lb (103 kg) BMI (Calculated): 34.52 Weight at Last Visit: 226lb Weight Lost Since Last Visit: 0lb Weight Gained Since Last Visit: 1lb Starting Weight: 309lb Total Weight Loss (lbs): 82 lb (37.2 kg) Peak Weight: 312lb   No data recorded  RMR: 1426  Today's Visit #: 19  Starting Date: 02/22/23   Subjective   Chief Complaint: Obesity  Interval History Discussed the use of AI scribe software for clinical note transcription with the patient, who gave verbal consent to proceed.  History of Present Illness Ebony Warren is a 48 year old female with hypertension, insulin  resistance, and multiple sclerosis who presents for medical weight management.  She has successfully lost approximately 84.7 pounds, which is 27% of her body weight, over the past year and nine months through lifestyle changes and medication, including topiramate  and metformin . Despite fluctuations in weight due to changes in her home environment, she reports continued weight loss over the past year and nine months. Her current BMI is 34, down from an initial BMI of 46. She is on medications that may contribute to weight gain, including Cymbalta  and Abilify , but has maintained a steady weight loss trajectory. She has no issues with appetite control, feels satisfied after meals, and does not experience significant hunger between meals. Her sleep quality is good, and she feels upbeat about the upcoming fall season.  She reports persistent swelling in her right foot despite elevation and positional changes. Compression socks are uncomfortable and difficult to use, and she avoids using furosemide  at home due to increased urination. She elevates her legs three times a day and uses different seating  positions to alleviate swelling. No skin openings or blisters on her legs and no issues with bladder emptying.  Her nutrition has been slightly disrupted due to a broken refrigerator, but she is confident about returning to her routine once the new refrigerator is installed. She maintains a balanced diet, opting for healthy snacks like fruit and peanut butter, and plans to resume gym activities now that her chair is repaired.     Challenges affecting patient progress: orthopedic problems, medical conditions or chronic pain affecting mobility and medical comorbidities.    Pharmacotherapy for weight management: She is currently taking Metformin  (off label use for weight management and / or insulin  resistance and / or diabetes prevention) with adequate clinical response  and without side effects. and Topiramate  (off label use, single agent) with adequate clinical response  and without side effects..   Assessment and Plan   Treatment Plan For Obesity:  Recommended Dietary Goals  Ebony Warren is currently in the action stage of change. As such, her goal is to continue weight management plan. She has agreed to: continue current plan  Behavioral Health and Counseling  We discussed the following behavioral modification strategies today: continue to work on maintaining a reduced calorie state, getting the recommended amount of protein, incorporating whole foods, making healthy choices, staying well hydrated and practicing mindfulness when eating. and increase protein intake, fibrous foods (25 grams per day for women, 30 grams for men) and water to improve satiety and decrease hunger signals. .  Additional education and resources provided today: None  Recommended Physical Activity Goals  Ebony Warren has been advised to work up to 150 minutes of moderate intensity aerobic activity  a week and strengthening exercises 2-3 times per week for cardiovascular health, weight loss maintenance and preservation of  muscle mass.  She has agreed to :  Think about enjoyable ways to increase daily physical activity and overcoming barriers to exercise, Increase physical activity in their day and reduce sedentary time (increase NEAT)., Increase volume of physical activity to a goal of 240 minutes a week, and Combine aerobic and strengthening exercises for efficiency and improved cardiometabolic health.  Medical Interventions and Pharmacotherapy  We discussed various medication options to help [name] with her weight loss efforts and we both agreed to : Continue with current nutritional and behavioral strategies and consider adding phentermine next if she starts to enter a plateau  Associated Conditions Impacted by Obesity Treatment  Assessment & Plan Essential hypertension Vitals:   08/01/24 1100  BP: 128/82    Blood pressure is at goal for age and risk category.  On losartan  without adverse effects.  Most recent renal parameters reviewed which showed stable electrolytes and kidney function.  Continue with weight loss therapy. Losing 10% may improve blood pressure control. Monitor for symptoms of orthostasis while losing weight. Continue current regimen and home monitoring for a goal blood pressure of < 120/80.   Generalized obesity with a starting BMI of 47 Obesity with associated hypertension and insulin  resistance She has achieved a 27% weight reduction, losing approximately 84.7 pounds over a year and nine months. Her BMI has decreased from 46 to 34, indicating significant progress. Weight loss has been steady without plateau, likely aided by topiramate  and metformin . She manages her appetite well without significant hunger between meals. Nutrition was slightly disrupted due to lack of refrigeration, but she is confident in returning to her routine. Exercise is expected to resume with her repaired chair. If a plateau occurs, phentermine may be considered for fatigue and appetite suppression. - Continue  weight management with topiramate  and metformin . - Encourage resumption of exercise with repaired chair. - Advise on balanced nutrition with whole foods, protein, and healthy snacks. - Consider phentermine if weight loss plateaus or fatigue arises. - Schedule fasting labs in four weeks to monitor cholesterol, diabetes markers, and kidney function. BMI 34.0-34.9,adult  Insulin  resistance She is due for disease monitoring labs we will check fasting blood glucose, insulin  and A1c at the next office visit.  Continue metformin  for pharmacoprophylaxis Leg edema She has pitting edema up to mid tibia.  She is on several medications that could cause leg swelling she also has a difficulty keeping her legs upright due to her MS she does not tolerate compression socks.  In addition to elevating legs for 15 minutes 3-4 times a day she will use furosemide  1 tablet once or twice per week.         Objective   Physical Exam:  Blood pressure 128/82, pulse 61, height 5' 8 (1.727 m), weight 227 lb (103 kg), SpO2 100%. Body mass index is 34.52 kg/m.  General: She is overweight, cooperative, alert, well developed, and in no acute distress. PSYCH: Has normal mood, affect and thought process.   HEENT: EOMI, sclerae are anicteric. Lungs: Normal breathing effort, no conversational dyspnea. Extremities: No edema.  Neurologic: No gross sensory or motor deficits. No tremors or fasciculations noted.    Diagnostic Data Reviewed:  BMET    Component Value Date/Time   NA 139 05/19/2024 1455   NA 142 11/17/2023 0917   K 3.6 05/19/2024 1455   CL 109 05/19/2024 1455   CO2 23 05/19/2024  1455   GLUCOSE 82 05/19/2024 1455   BUN 15 05/19/2024 1455   BUN 12 11/17/2023 0917   CREATININE 0.70 05/19/2024 1455   CALCIUM 9.0 05/19/2024 1455   GFRNONAA >60 05/19/2024 1455   GFRAA 87 12/10/2020 1251   Lab Results  Component Value Date   HGBA1C 5.2 11/26/2023   HGBA1C 5.3 08/19/2022   Lab Results  Component  Value Date   INSULIN  4.8 11/17/2023   INSULIN  25.0 (H) 02/23/2023   Lab Results  Component Value Date   TSH 1.690 02/23/2023   CBC    Component Value Date/Time   WBC 4.7 07/07/2024 0937   RBC 4.45 07/07/2024 0937   HGB 13.6 07/07/2024 0937   HGB 12.8 05/19/2024 1455   HGB 13.9 01/04/2024 1352   HCT 41.6 07/07/2024 0937   HCT 42.1 01/04/2024 1352   PLT 267.0 07/07/2024 0937   PLT 276 05/19/2024 1455   PLT 291 01/04/2024 1352   MCV 93.7 07/07/2024 0937   MCV 95 01/04/2024 1352   MCH 30.5 05/19/2024 1455   MCHC 32.6 07/07/2024 0937   RDW 14.3 07/07/2024 0937   RDW 13.0 01/04/2024 1352   Iron Studies    Component Value Date/Time   IRON 68 10/27/2022 1056   TIBC 313 10/27/2022 1056   FERRITIN 41 10/27/2022 1056   IRONPCTSAT 22 10/27/2022 1056   Lipid Panel     Component Value Date/Time   CHOL 149 02/23/2023 1305   TRIG 72 02/23/2023 1305   HDL 50 02/23/2023 1305   LDLCALC 85 02/23/2023 1305   Hepatic Function Panel     Component Value Date/Time   PROT 7.2 05/19/2024 1455   PROT 6.7 11/17/2023 0917   ALBUMIN 4.2 05/19/2024 1455   ALBUMIN 4.2 11/17/2023 0917   AST 7 (L) 05/19/2024 1455   ALT 5 05/19/2024 1455   ALKPHOS 68 05/19/2024 1455   BILITOT 0.3 05/19/2024 1455      Component Value Date/Time   TSH 1.690 02/23/2023 1305   Nutritional Lab Results  Component Value Date   VD25OH 55.6 02/23/2023   VD25OH 38.1 09/23/2021   VD25OH 34.1 12/22/2016    Medications: Outpatient Encounter Medications as of 08/01/2024  Medication Sig Note   acetaminophen  (TYLENOL ) 160 MG/5ML elixir Take 15 mg/kg by mouth every 4 (four) hours as needed for fever (OTC).    apixaban  (ELIQUIS ) 5 MG TABS tablet Take 1 tablet (5 mg total) by mouth 2 (two) times daily.    ARIPiprazole  (ABILIFY ) 5 MG tablet Take 5 mg by mouth daily.    baclofen  (LIORESAL ) 20 MG tablet Take 1 tablet (20 mg total) by mouth 3 (three) times daily.    cholecalciferol (VITAMIN D ) 1000 UNITS tablet Take  1,000 Units by mouth daily.    diphenhydrAMINE  (BENADRYL ) 25 mg capsule Take 25 mg by mouth every 6 (six) hours as needed for allergies.    DULoxetine  (CYMBALTA ) 30 MG capsule Take 1 capsule by mouth daily.    LORazepam  (ATIVAN ) 1 MG tablet Take 1 tablet (1 mg total) by mouth daily.    losartan  (COZAAR ) 50 MG tablet Take 1 tablet (50 mg total) by mouth daily.    metFORMIN  (GLUCOPHAGE -XR) 500 MG 24 hr tablet Take 1 tablet (500 mg total) by mouth 2 (two) times daily with a meal.    Multiple Vitamins-Minerals (MULTIVITAMIN ADULT PO) Take 1 tablet by mouth daily.    nystatin  (MYCOSTATIN /NYSTOP ) powder Apply 1 Application topically 2 (two) times daily.    ocrelizumab  (OCREVUS )  300 MG/10ML injection Infuse 600 mg IV Every 6 months 06/10/2021: 06/10/21 Medicare PArt D approved 12/07/20 - 06/10/2022.     oxybutynin  (DITROPAN -XL) 10 MG 24 hr tablet Take 1 tablet (10 mg total) by mouth daily as needed (to prevent incontinence).    pantoprazole  (PROTONIX ) 40 MG tablet Take 1 tablet (40 mg total) by mouth daily.    polyethylene glycol powder (GLYCOLAX /MIRALAX ) 17 GM/SCOOP powder Take 17 g by mouth 2 (two) times daily as needed.    Potassium (POTASSIMIN PO) Take by mouth daily at 12 noon.    tiZANidine  (ZANAFLEX ) 4 MG tablet Take 1 tablet (4 mg total) by mouth 3 (three) times daily.    topiramate  (TOPAMAX ) 50 MG tablet Take 1 tablet (50 mg total) by mouth 2 (two) times daily.    Facility-Administered Encounter Medications as of 08/01/2024  Medication   diphenhydrAMINE  (BENADRYL ) injection 50 mg   famotidine  (PEPCID ) 20 mg in sodium chloride  0.9 % 50 mL IVPB   methylPREDNISolone  sodium succinate (SOLU-MEDROL ) 125 mg in sodium chloride  0.9 % 50 mL IVPB   ocrelizumab  (OCREVUS ) 600 mg in sodium chloride  0.9 % 250 mL     Follow-Up   Return in about 4 weeks (around 08/29/2024) for Fasting for labs.. She was informed of the importance of frequent follow up visits to maximize her success with intensive lifestyle  modifications for her multiple health conditions.  Attestation Statement   Reviewed by clinician on day of visit: allergies, medications, problem list, medical history, surgical history, family history, social history, and previous encounter notes.     Lucas Parker, MD

## 2024-08-01 NOTE — Assessment & Plan Note (Signed)
 She has pitting edema up to mid tibia.  She is on several medications that could cause leg swelling she also has a difficulty keeping her legs upright due to her MS she does not tolerate compression socks.  In addition to elevating legs for 15 minutes 3-4 times a day she will use furosemide  1 tablet once or twice per week.

## 2024-08-01 NOTE — Assessment & Plan Note (Signed)
 Vitals:   08/01/24 1100  BP: 128/82    Blood pressure is at goal for age and risk category.  On losartan  without adverse effects.  Most recent renal parameters reviewed which showed stable electrolytes and kidney function.  Continue with weight loss therapy. Losing 10% may improve blood pressure control. Monitor for symptoms of orthostasis while losing weight. Continue current regimen and home monitoring for a goal blood pressure of < 120/80.

## 2024-08-01 NOTE — Assessment & Plan Note (Signed)
 She is due for disease monitoring labs we will check fasting blood glucose, insulin  and A1c at the next office visit.  Continue metformin  for pharmacoprophylaxis

## 2024-08-01 NOTE — Assessment & Plan Note (Signed)
 Obesity with associated hypertension and insulin  resistance She has achieved a 27% weight reduction, losing approximately 84.7 pounds over a year and nine months. Her BMI has decreased from 46 to 34, indicating significant progress. Weight loss has been steady without plateau, likely aided by topiramate  and metformin . She manages her appetite well without significant hunger between meals. Nutrition was slightly disrupted due to lack of refrigeration, but she is confident in returning to her routine. Exercise is expected to resume with her repaired chair. If a plateau occurs, phentermine may be considered for fatigue and appetite suppression. - Continue weight management with topiramate  and metformin . - Encourage resumption of exercise with repaired chair. - Advise on balanced nutrition with whole foods, protein, and healthy snacks. - Consider phentermine if weight loss plateaus or fatigue arises. - Schedule fasting labs in four weeks to monitor cholesterol, diabetes markers, and kidney function.

## 2024-08-02 ENCOUNTER — Encounter: Payer: Self-pay | Admitting: Neurology

## 2024-08-09 ENCOUNTER — Telehealth: Payer: Self-pay | Admitting: Gastroenterology

## 2024-08-09 NOTE — Telephone Encounter (Addendum)
 Procedure:Colonoscopy Procedure date: 08/17/24 Procedure location: [facility] Arrival Time: 9:45 am Spoke with the patient Y/N:   No, I left a detailed message on [phone] on 08/09/24 @ 2:26 pm for the patient to return call   Any prep concerns? ___  Has the patient obtained the prep from the pharmacy ? ___ Do you have a care partner and transportation: ___ Any additional concerns? ___

## 2024-08-09 NOTE — Telephone Encounter (Signed)
 Inbound call from patient stating she has an upcoming procedure on 9/11 at [facility] and would like to know what time she has to arrive on 9/10 for her preadmission. Let patient know she is to arrive on 9/11 at 9:45am for her procedure. Patient continued to say her point on scheduling at the hospital was to do prep there and not at home. Requesting a call back from nurse  Please advise  Thank you

## 2024-08-09 NOTE — Telephone Encounter (Signed)
 Patient called to confirm the date and arrival time for her colonoscopy 08/17/24.

## 2024-08-09 NOTE — Telephone Encounter (Signed)
 Spoke with pt. She states she has MS and was told that she would be checked in on 08/16/24 to get her prep at the hospital. She states that she was told today that her check in to the hospital would not be until the morning of the procedure.  After reviewing notes, Patty, RN posted:   Colon has been entered for 9/11 1145 am Dr Legrand GLENN at [facility] she is to be admitted the day prior. I will call bed control [phone] and notify the app at that time.    Last does of Eliquis  on 08/15/24    Bed control will call pt on 9/10 when bed is available.  Pt advised to expect a call from [facility].  She has been advised to stop Eliquis  on 9/9. Pt notified of notes written above. Will verify this information with Patty, RN tomorrow and update pt if changes have been made.

## 2024-08-10 ENCOUNTER — Telehealth: Payer: Self-pay | Admitting: Gastroenterology

## 2024-08-10 ENCOUNTER — Encounter (HOSPITAL_COMMUNITY): Payer: Self-pay | Admitting: Gastroenterology

## 2024-08-10 NOTE — Telephone Encounter (Signed)
 The pt is being pre admitted and does not need instructions for procedure. She has been advised and aware the hospital will take care of her prep and instructions

## 2024-08-10 NOTE — Telephone Encounter (Signed)
 Noted! Thank you

## 2024-08-10 NOTE — Telephone Encounter (Signed)
 Inbound call from pt requesting to have new instruction sent over to her My chart due to her procedure now being on September the 11 th. Please advise.

## 2024-08-10 NOTE — Telephone Encounter (Signed)
 Spoke with Ebony Warren and verified that information regarding pt admission the day prior is correct. Called pt and verified this information with pt. She verbalized understanding.

## 2024-08-14 ENCOUNTER — Ambulatory Visit (HOSPITAL_COMMUNITY): Admit: 2024-08-14 | Admitting: Gastroenterology

## 2024-08-14 ENCOUNTER — Encounter (HOSPITAL_COMMUNITY): Payer: Self-pay

## 2024-08-14 SURGERY — ULTRASOUND, UPPER GI TRACT, ENDOSCOPIC
Anesthesia: Monitor Anesthesia Care

## 2024-08-16 ENCOUNTER — Other Ambulatory Visit: Payer: Self-pay

## 2024-08-16 ENCOUNTER — Observation Stay (HOSPITAL_COMMUNITY)
Admission: RE | Admit: 2024-08-16 | Discharge: 2024-08-18 | Disposition: A | Discharge status: Home / Self Care | Attending: Gastroenterology | Admitting: Gastroenterology

## 2024-08-16 DIAGNOSIS — K644 Residual hemorrhoidal skin tags: Secondary | ICD-10-CM | POA: Diagnosis not present

## 2024-08-16 DIAGNOSIS — K625 Hemorrhage of anus and rectum: Secondary | ICD-10-CM

## 2024-08-16 DIAGNOSIS — K648 Other hemorrhoids: Secondary | ICD-10-CM | POA: Diagnosis not present

## 2024-08-16 DIAGNOSIS — I10 Essential (primary) hypertension: Secondary | ICD-10-CM | POA: Diagnosis not present

## 2024-08-16 DIAGNOSIS — Z86711 Personal history of pulmonary embolism: Secondary | ICD-10-CM | POA: Insufficient documentation

## 2024-08-16 DIAGNOSIS — G35 Multiple sclerosis: Secondary | ICD-10-CM | POA: Diagnosis not present

## 2024-08-16 DIAGNOSIS — Z87891 Personal history of nicotine dependence: Secondary | ICD-10-CM | POA: Insufficient documentation

## 2024-08-16 DIAGNOSIS — K921 Melena: Secondary | ICD-10-CM | POA: Diagnosis not present

## 2024-08-16 DIAGNOSIS — Z1211 Encounter for screening for malignant neoplasm of colon: Principal | ICD-10-CM | POA: Insufficient documentation

## 2024-08-16 DIAGNOSIS — K5909 Other constipation: Secondary | ICD-10-CM | POA: Diagnosis not present

## 2024-08-16 DIAGNOSIS — K862 Cyst of pancreas: Secondary | ICD-10-CM | POA: Insufficient documentation

## 2024-08-16 DIAGNOSIS — R194 Change in bowel habit: Secondary | ICD-10-CM

## 2024-08-16 MED ORDER — SODIUM CHLORIDE 0.9 % IV SOLN: INTRAVENOUS | Status: AC

## 2024-08-16 MED ORDER — OXYBUTYNIN CHLORIDE ER 10 MG PO TB24: 10.0000 mg | ORAL_TABLET | Freq: Every day | ORAL | Status: DC | PRN

## 2024-08-16 MED ORDER — LOSARTAN POTASSIUM 50 MG PO TABS
50.0000 mg | ORAL_TABLET | Freq: Every day | ORAL | Status: DC
Start: 2024-08-17 — End: 2024-08-18
  Administered 2024-08-17 – 2024-08-18 (×2): 50 mg via ORAL
  Filled 2024-08-16 (×2): qty 1

## 2024-08-16 MED ORDER — NA SULFATE-K SULFATE-MG SULF 17.5-3.13-1.6 GM/177ML PO SOLN
0.5000 | Freq: Once | ORAL | Status: AC
  Administered 2024-08-16: 177 mL via ORAL
  Filled 2024-08-16: qty 1

## 2024-08-16 MED ORDER — LORAZEPAM 1 MG PO TABS
1.0000 mg | ORAL_TABLET | Freq: Every day | ORAL | Status: DC
Start: 2024-08-17 — End: 2024-08-18
  Administered 2024-08-17 – 2024-08-18 (×2): 1 mg via ORAL
  Filled 2024-08-16 (×2): qty 1

## 2024-08-16 MED ORDER — TOPIRAMATE 25 MG PO TABS
50.0000 mg | ORAL_TABLET | Freq: Two times a day (BID) | ORAL | Status: DC
  Administered 2024-08-16 – 2024-08-18 (×4): 50 mg via ORAL
  Filled 2024-08-16 (×4): qty 2

## 2024-08-16 MED ORDER — ARIPIPRAZOLE 5 MG PO TABS
5.0000 mg | ORAL_TABLET | Freq: Every day | ORAL | Status: DC
Start: 2024-08-17 — End: 2024-08-18
  Administered 2024-08-17 – 2024-08-18 (×2): 5 mg via ORAL
  Filled 2024-08-16 (×2): qty 1

## 2024-08-16 MED ORDER — ADULT MULTIVITAMIN W/MINERALS CH
1.0000 | ORAL_TABLET | Freq: Every day | ORAL | Status: DC
  Administered 2024-08-17 – 2024-08-18 (×2): 1 via ORAL
  Filled 2024-08-16 (×2): qty 1

## 2024-08-16 MED ORDER — METFORMIN HCL ER 500 MG PO TB24
500.0000 mg | ORAL_TABLET | Freq: Two times a day (BID) | ORAL | Status: DC
  Administered 2024-08-16 – 2024-08-18 (×4): 500 mg via ORAL
  Filled 2024-08-16 (×5): qty 1

## 2024-08-16 MED ORDER — TIZANIDINE HCL 4 MG PO TABS
4.0000 mg | ORAL_TABLET | Freq: Three times a day (TID) | ORAL | Status: DC
  Administered 2024-08-16 – 2024-08-18 (×6): 4 mg via ORAL
  Filled 2024-08-16 (×5): qty 1

## 2024-08-16 MED ORDER — DIPHENHYDRAMINE HCL 25 MG PO CAPS: 25.0000 mg | ORAL_CAPSULE | Freq: Four times a day (QID) | ORAL | Status: DC | PRN

## 2024-08-16 MED ORDER — NA SULFATE-K SULFATE-MG SULF 17.5-3.13-1.6 GM/177ML PO SOLN
0.5000 | Freq: Once | ORAL | Status: AC
  Administered 2024-08-16: 177 mL via ORAL

## 2024-08-16 MED ORDER — BACLOFEN 20 MG PO TABS
20.0000 mg | ORAL_TABLET | Freq: Three times a day (TID) | ORAL | Status: DC
  Administered 2024-08-16 – 2024-08-18 (×6): 20 mg via ORAL
  Filled 2024-08-16: qty 1
  Filled 2024-08-16: qty 2
  Filled 2024-08-16: qty 1
  Filled 2024-08-16: qty 2
  Filled 2024-08-16 (×2): qty 1

## 2024-08-16 MED ORDER — VITAMIN D 25 MCG (1000 UNIT) PO TABS
1000.0000 [IU] | ORAL_TABLET | Freq: Every day | ORAL | Status: DC
  Administered 2024-08-17 – 2024-08-18 (×2): 1000 [IU] via ORAL
  Filled 2024-08-16 (×2): qty 1

## 2024-08-16 MED ORDER — DULOXETINE HCL 30 MG PO CPEP
30.0000 mg | ORAL_CAPSULE | Freq: Every day | ORAL | Status: DC
  Administered 2024-08-17 – 2024-08-18 (×2): 30 mg via ORAL
  Filled 2024-08-16 (×2): qty 1

## 2024-08-16 MED ORDER — ACETAMINOPHEN 160 MG/5ML PO SOLN: 640.0000 mg | Freq: Two times a day (BID) | ORAL | Status: DC | PRN

## 2024-08-16 MED ORDER — FLEET ENEMA RE ENEM
1.0000 | ENEMA | Freq: Once | RECTAL | Status: AC
  Administered 2024-08-16: 1 via RECTAL
  Filled 2024-08-16: qty 1

## 2024-08-16 NOTE — Telephone Encounter (Signed)
 Noted

## 2024-08-16 NOTE — Telephone Encounter (Signed)
 PT mother is calling to let us  know that they have a bed available for her and they are on the way to be admitted. Advised that instructions will be given at the hospital. PT confirmed she understood.

## 2024-08-16 NOTE — H&P (Signed)
 H&P  Primary Care Physician:  Shawn Sick, MD Primary Gastroenterologist:  Dr. Legrand       Reason for Admission: Screening colonoscopy in a patient with MS   HPI:   Ebony Warren is a 48 y.o. female with a past medical history as listed below including multiple sclerosis, PE on Eliquis  (last dose 08/15/2024) and osteoarthritis, known to Dr. Legrand, who presents for admission to the hospital today for bowel preparation for screening colonoscopy scheduled tomorrow.    06/23/2024 patient did discuss some bowel habit changes and rectal bleeding with Dr. Wilhelmenia.    Today, patient tells me that she feels well.  She really does not have any questions regarding colonoscopy.  She remains constipated, she does take daily MiraLAX  but has not had a bowel movement in 3 days.  This is pretty normal for her.  No new complaints or concerns.    Denies fever, chills, weight loss, change in bowel habits, abdominal pain, nausea, vomiting or symptoms that awaken her from sleep.     GI history: 07/08/2024 patient had normal chromogranin A per Dr. Wilhelmenia making neuroendocrine tumor less likely 06/23/2024 video visit with Dr. Wilhelmenia for pancreatic cyst.  At that time recommended follow-up chromogranin A with the patient off of her PPI 05/23/2024 MRI of the abdomen with and without contrast for follow-up of a pancreatic lesion with a 1.8 x 1.9 x 1.5 cm cystic lesion along the inferior margin of the tail the pancreas with 1-2 internal septae but no internal nodularity no appreciable enhancement at that time recommended EUS with FNA or follow-up pancreatic protocol MRI with and without contrast in 6 months 05/12/2023 office visit with Dr. Legrand and at that time presented to discuss a screening colonoscopy.  We discussed that she had MS and is wheelchair-bound with chronic constipation and would require hospital admission to do bowel prep for screening colonoscopy.  At the time we were not doing this  and she was told to call back in a year to arrange.  Past Medical History:  Diagnosis Date   ADD (attention deficit disorder)    Anxiety and depression    Arthritis    Bradycardia    Constipation    Edema of both lower extremities    Encounter to establish care 01/22/2021   Family history of adverse reaction to anesthesia    nausea   Food allergy    Gallbladder problem    GERD (gastroesophageal reflux disease)    Headache    Hypertension    Multiple sclerosis (HCC)    Obesity    Osteoarthritis    Screening mammogram, encounter for 01/22/2021   Seizures (HCC)    told that they are not from the brain but stress related   Swelling of lower extremity 03/24/2023   Vitamin D  deficiency     Past Surgical History:  Procedure Laterality Date   BREAST REDUCTION SURGERY Bilateral 06/23/2021   Procedure: MAMMARY REDUCTION  (BREAST);  Surgeon: Elisabeth Craig RAMAN, MD;  Location: [facility] OR;  Service: Plastics;  Laterality: Bilateral;   CHOLECYSTECTOMY     KNEE SURGERY Bilateral    Arthroscopy    Family History  Problem Relation Age of Onset   Hypertension Mother    Atrial fibrillation Mother    Thyroid  disease Mother    Endometrial cancer Mother    Obesity Mother    Colon polyps Mother    Hypertension Father    Atrial fibrillation Maternal Grandmother  Lung cancer Maternal Grandfather    Colon cancer Neg Hx    Esophageal cancer Neg Hx    Inflammatory bowel disease Neg Hx    Liver disease Neg Hx    Pancreatic cancer Neg Hx    Rectal cancer Neg Hx    Stomach cancer Neg Hx     Social History   Tobacco Use   Smoking status: Former    Current packs/day: 0.00    Average packs/day: 0.4 packs/day for 3.0 years (1.1 ttl pk-yrs)    Types: Cigarettes    Start date: 12/08/2003    Quit date: 12/07/2006    Years since quitting: 17.7   Smokeless tobacco: Never  Vaping Use   Vaping status: Never Used  Substance Use Topics   Alcohol use: No    Alcohol/week: 0.0 standard drinks of  alcohol    Comment: quit: 2013 (socially)   Drug use: Not Currently    Types: Marijuana    Comment: Smokes approx 1-2 per day    Prior to Admission medications   Medication Sig Start Date End Date Taking? Authorizing Provider  acetaminophen  (TYLENOL ) 160 MG/5ML elixir Take 15 mg/kg by mouth every 4 (four) hours as needed for fever (OTC).    [provider]  apixaban  (ELIQUIS ) 5 MG TABS tablet Take 1 tablet (5 mg total) by mouth 2 (two) times daily. 06/14/24   Thayil, Irene T, PA-C  ARIPiprazole  (ABILIFY ) 5 MG tablet Take 5 mg by mouth daily.    [provider]  baclofen  (LIORESAL ) 20 MG tablet Take 1 tablet (20 mg total) by mouth 3 (three) times daily. 10/18/23 01/10/25  Penumalli, Eduard SAUNDERS, MD  cholecalciferol  (VITAMIN D ) 1000 UNITS tablet Take 1,000 Units by mouth daily.    [provider]  diphenhydrAMINE  (BENADRYL ) 25 mg capsule Take 25 mg by mouth every 6 (six) hours as needed for allergies.    [provider]  DULoxetine  (CYMBALTA ) 30 MG capsule Take 1 capsule by mouth daily. 07/23/22   [provider]  LORazepam  (ATIVAN ) 1 MG tablet Take 1 tablet (1 mg total) by mouth daily. 06/25/22   Tegeler, Lonni PARAS, MD  losartan  (COZAAR ) 50 MG tablet Take 1 tablet (50 mg total) by mouth daily. 03/13/24 03/13/25  Lovie Clarity, MD  metFORMIN  (GLUCOPHAGE -XR) 500 MG 24 hr tablet Take 1 tablet (500 mg total) by mouth 2 (two) times daily with a meal. 06/28/24   Francyne Romano, MD  Multiple Vitamins-Minerals (MULTIVITAMIN ADULT PO) Take 1 tablet by mouth daily.    [provider]  nystatin  (MYCOSTATIN /NYSTOP ) powder Apply 1 Application topically 2 (two) times daily. 02/17/24   Francyne Romano, MD  ocrelizumab  (OCREVUS ) 300 MG/10ML injection Infuse 600 mg IV Every 6 months 05/16/19   Penumalli, Vikram R, MD  oxybutynin  (DITROPAN -XL) 10 MG 24 hr tablet Take 1 tablet (10 mg total) by mouth daily as needed (to prevent incontinence). 01/12/24 01/11/25  Lovie Clarity, MD  pantoprazole  (PROTONIX ) 40 MG tablet Take 1 tablet (40 mg total) by mouth daily. 08/12/23 08/11/24  Lovie Clarity, MD  polyethylene glycol powder (GLYCOLAX /MIRALAX ) 17 GM/SCOOP powder Take 17 g by mouth 2 (two) times daily as needed. 06/16/23   Francyne Romano, MD  Potassium (POTASSIMIN PO) Take by mouth daily at 12 noon.    [provider]  tiZANidine  (ZANAFLEX ) 4 MG tablet Take 1 tablet (4 mg total) by mouth 3 (three) times daily. 10/18/23 01/10/25  Penumalli, Eduard SAUNDERS, MD  topiramate  (TOPAMAX ) 50 MG tablet Take 1  tablet (50 mg total) by mouth 2 (two) times daily. 10/18/23   Penumalli, Eduard SAUNDERS, MD    Current Facility-Administered Medications  Medication Dose Route Frequency Provider Last Rate Last Admin   0.9 %  sodium chloride  infusion   Intravenous Continuous Shawnee Higham, Delon Gibson, PA       Na Sulfate-K Sulfate-Mg Sulfate concentrate (SUPREP) kit 177 mL  0.5 kit Oral Once Beather Delon Gibson, PA       Followed by   Na Sulfate-K Sulfate-Mg Sulfate concentrate (SUPREP) kit 177 mL  0.5 kit Oral Once Anaise Sterbenz Lynne, PA       sodium phosphate  (FLEET) enema 1 enema  1 enema Rectal Once Beather Delon Gibson, PA        Allergies as of 07/05/2024 - Review Complete 06/28/2024  Allergen Reaction Noted   Bioflavonoids Hives 08/19/2022   Oxycodone -acetaminophen  Itching 08/19/2022   Hydrocodone  Itching 04/30/2021   Morphine  Itching 05/13/2016   Orange fruit [citrus]  05/01/2015   Oxycodone  Itching 05/13/2016   Tomato Hives 06/25/2022   Other Rash 10/02/2014     Review of Systems:    Constitutional: No weight loss, fever or chills Skin: No rash or itching Cardiovascular: No chest pain Respiratory: No SOB or cough Gastrointestinal: See HPI and otherwise negative Genitourinary: No dysuria  Neurological: No headache, dizziness or syncope Musculoskeletal: +numbness and weakness related to MS Hematologic: No bleeding  Psychiatric: +depression   Physical Exam:   Vital signs in last 24 hours: Temp:  [97.7 F (36.5 C)] 97.7 F (36.5 C) (09/10 1413) Pulse Rate:  [58] 58 (09/10 1413) Resp:  [16] 16 (09/10 1413) BP: (117)/(80) 117/80 (09/10 1413) SpO2:  [100 %] 100 % (09/10 1403) Weight:  [101.9 kg] 101.9 kg (09/10 1413)   General:   Pleasant AA female appears to be in NAD, Well developed, Well nourished, alert and cooperative Head:  Normocephalic and atraumatic. Eyes:   PEERL, EOMI. No icterus. Conjunctiva pink. Ears:  Normal auditory acuity. Neck:  Supple Throat: Oral cavity and pharynx without inflammation, swelling or lesion. Teeth in good condition. Lungs: Respirations even and unlabored. Lungs clear to auscultation bilaterally.   No wheezes, crackles, or rhonchi.  Heart: Normal S1, S2. No MRG. Regular rate and rhythm.1-2+ peripheral edema, R>L Abdomen:  Soft, nondistended, nontender. No rebound or guarding. Normal bowel sounds. No appreciable masses or hepatomegaly. Rectal:  Not performed.  Msk:  Peripheral pulses intact.  Extremities:  +MS with some joint contraction Neurologic:  Alert and  oriented x4; +hemiparesis Skin:   Dry and intact without significant lesions or rashes. Psychiatric: Demonstrates good judgement and reason without abnormal affect or behaviors.   Impression / Plan:   Impression: 1.  Screening for colorectal cancer: Patient is 35 never had screening for colon cancer 2.  Multiple sclerosis 3.  Chronic constipation: Managed with MiraLAX  4.  PE on Eliquis : Eliquis  has been held since 9/9 5. Pancreatic Cyst: being monitored with MRI surveillance, the next in October 6. Change in bowel habits: Discussed with Dr. Wilhelmenia in July, change to little pebbles, now better with MIralax  7. Blood in stool: Since starting blood thinner which she attributed to hemorrhoids, discussed with Dr. Wilhelmenia- no further episodes  Plan: 1.  Admitted to the hospital today with plans for screening colonoscopy tomorrow with Dr. Legrand.   Did review risks, benefits, limitations and alternatives and patient agrees to proceed. 2.  Patient will have a fleets enema followed by tapwater enema this afternoon and then start Suprep in  split dose fashion. 3.  Patient be on a clear liquid diet today and n.p.o. tomorrow starting at 6:00 AM.  Procedure scheduled around 1030. 4.  Patient will likely be discharged tomorrow after time of colonoscopy.  Delon Gibson [facility]  08/16/2024, 2:53 PM

## 2024-08-16 NOTE — Plan of Care (Signed)

## 2024-08-17 ENCOUNTER — Ambulatory Visit: Admitting: Adult Health

## 2024-08-17 DIAGNOSIS — R195 Other fecal abnormalities: Secondary | ICD-10-CM

## 2024-08-17 DIAGNOSIS — Z1211 Encounter for screening for malignant neoplasm of colon: Secondary | ICD-10-CM | POA: Diagnosis not present

## 2024-08-17 DIAGNOSIS — K5909 Other constipation: Secondary | ICD-10-CM

## 2024-08-17 DIAGNOSIS — R198 Other specified symptoms and signs involving the digestive system and abdomen: Secondary | ICD-10-CM | POA: Diagnosis not present

## 2024-08-17 HISTORY — DX: Other pulmonary embolism without acute cor pulmonale: I26.99

## 2024-08-17 MED ORDER — BOOST / RESOURCE BREEZE PO LIQD CUSTOM
1.0000 | Freq: Three times a day (TID) | ORAL | Status: DC
  Administered 2024-08-17 – 2024-08-18 (×4): 1 via ORAL

## 2024-08-17 MED ORDER — PEG 3350-KCL-NA BICARB-NACL 420 G PO SOLR
4000.0000 mL | Freq: Once | ORAL | Status: AC
Start: 2024-08-17 — End: 2024-08-17
  Administered 2024-08-17: 4000 mL via ORAL

## 2024-08-17 MED ORDER — ACETAMINOPHEN 160 MG/5ML PO SOLN
640.0000 mg | Freq: Two times a day (BID) | ORAL | Status: DC | PRN
  Administered 2024-08-17: 640 mg via ORAL
  Filled 2024-08-17: qty 20.3

## 2024-08-17 NOTE — TOC Initial Note (Signed)
 Transition of Care [facility]) - Initial/Assessment Note    Patient Details  Name: Ebony Warren MRN: [id number] Date of Birth: [birth year]  Transition of Care [facility]) CM/SW Contact:    Doneta Glenys DASEN, RN Phone Number: 08/17/2024, 3:25 PM  Clinical Narrative:                 MOON completed.Presented bowel preparation for screening colonoscopy.  Patient states PTA lives in a house with Jeralyn Perry (Mother) [phone] and son;PCP/insurance verified;DME-cane, walker,rollator,BSC, lift chair, hospital bed; [facility]- [facility] PCA Monday-Friday 3-3.5 hours, Saturday & Sunday 2 hours;denies oxygen  and SDOH needs; Mother will transport home at discharge. IP CM will follow progression to discharge.  Expected Discharge Plan: Home/Self Care Barriers to Discharge: Continued Medical Work up   Patient Goals and CMS Choice Patient states their goals for this hospitalization and ongoing recovery are:: Home CMS Medicare.gov Compare Post Acute Care list provided to::  (NA) Choice offered to / list presented to : NA Singer ownership interest in [facility].provided to:: Parent NA    Expected Discharge Plan and Services In-house Referral: NA Discharge Planning Services: CM Consult   Living arrangements for the past 2 months: Single Family Home                 DME Arranged: N/A DME Agency: NA       HH Arranged: NA HH Agency: NA        Prior Living Arrangements/Services Living arrangements for the past 2 months: Single Family Home Lives with:: Parents, Adult Children Patient language and need for interpreter reviewed:: Yes Do you feel safe going back to the place where you live?: Yes      Need for Family Participation in Patient Care: Yes (Comment) Care giver support system in place?: Yes (comment) Current home services: DME, Homehealth aide (cane, walker rollator,BSC, lift chair, hospital bed, [facility] PCS) Criminal Activity/Legal Involvement Pertinent to  Current Situation/Hospitalization: No - Comment as needed  Activities of Daily Living   ADL Screening (condition at time of admission) Independently performs ADLs?: No Does the patient have a NEW difficulty with bathing/dressing/toileting/self-feeding that is expected to last >3 days?: No Does the patient have a NEW difficulty with getting in/out of bed, walking, or climbing stairs that is expected to last >3 days?: No Does the patient have a NEW difficulty with communication that is expected to last >3 days?: No Is the patient deaf or have difficulty hearing?: No Does the patient have difficulty seeing, even when wearing glasses/contacts?: No Does the patient have difficulty concentrating, remembering, or making decisions?: No  Permission Sought/Granted Permission sought to share information with : Case Manager Permission granted to share information with : Yes, Verbal Permission Granted  Share Information with NAME: Jeralyn Perry (Mother)  [phone]           Emotional Assessment Appearance:: Appears stated age Attitude/Demeanor/Rapport: Engaged Affect (typically observed): Appropriate Orientation: : Oriented to Self, Oriented to Place, Oriented to  Time, Oriented to Situation Alcohol / Substance Use: Not Applicable Psych Involvement: No (comment)  Admission diagnosis:  Change in bowel habits [R19.4] Rectal bleeding [K62.5] Screening for colorectal cancer [Z12.11, Z12.12] Patient Active Problem List   Diagnosis Date Noted   Screening for colorectal cancer 08/16/2024   Generalized obesity with a starting BMI of 47 08/01/2024   BMI 34.0-34.9,adult 08/01/2024   Change in bowel habits 06/25/2024   Rectal bleeding 06/25/2024   Abnormal MRI of abdomen 06/25/2024   Chronic anticoagulation 06/25/2024  Elevated chromogranin A 06/25/2024   Viral URI with cough 06/16/2024   Pancreatic cyst 05/30/2024   Multiple subsegmental pulmonary emboli without acute cor pulmonale (HCC)  05/30/2024   Photodermatitis due to sun 04/28/2024   Intertrigo 02/17/2024   Perimenopausal symptoms 01/20/2024   Chronic constipation 06/16/2023   Leg edema 05/24/2023   Healthcare maintenance 03/24/2023   Family history of colon cancer 03/24/2023   Post-nasal drip 03/24/2023   Elevated ALT measurement 03/02/2023   Insulin  resistance 03/02/2023   Essential hypertension 12/23/2022   Abnormal menses 10/27/2022   Macromastia 06/23/2021   Gastroesophageal reflux disease 01/22/2021   Class 1 obesity with serious comorbidity and body mass index (BMI) of 34.0 to 34.9 in adult 01/22/2021   Generalized anxiety disorder 10/07/2018   Right optic neuritis 05/13/2016   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   Depression 01/27/2016   Absence of bladder continence 01/27/2016   Multiple sclerosis (HCC) 10/20/2014   Hemiplegia of dominant side (HCC) 08/30/2014   PCP:  Shawn Sick, MD Pharmacy:   [facility] - [place], [place] - [address] [facility] AT Portal to [facility] [address] [place] [address] Phone: [phone] Fax: [phone]  [id number] [place], [place] - [address] AT [facility] OF [address] [place] [address] Phone: [phone] Fax: [phone]  [id number] [place], [place] - [address] [place] [address] Phone: [phone] Fax: [phone]  [facility] [address] [place] [address] Phone: [phone] Fax: [phone]  [id number] [place], [place] - [address] AT [facility] [address] [place] [address] Phone: [phone] Fax: [phone]     Social Drivers of Health (SDOH) Social History: SDOH Screenings   Food Insecurity: No Food Insecurity (08/16/2024)  Housing: Low Risk  (08/16/2024)   Transportation Needs: No Transportation Needs (08/16/2024)  Utilities: Not At Risk (08/16/2024)  Alcohol Screen: Low Risk  (06/07/2024)  Depression (PHQ2-9): Medium Risk (06/07/2024)  Financial Resource Strain: Low Risk  (06/07/2024)  Physical Activity: Inactive (06/07/2024)  Social Connections: Socially Integrated (06/07/2024)  Stress: No Stress Concern Present (06/07/2024)  Tobacco Use: Medium Risk (08/10/2024)  Health Literacy: Inadequate Health Literacy (06/07/2024)   SDOH Interventions:     Readmission Risk Interventions    08/17/2024    3:21 PM  Readmission Risk Prevention Plan  Post Dischage Appt Complete  Medication Screening Complete  Transportation Screening Complete

## 2024-08-17 NOTE — Progress Notes (Addendum)
 Progress Note   Subjective  Day #2  Chief Complaint: Rectal bleeding change in bowel habits and no prior screening colonoscopy  This morning, unfortunately, nursing staff alerted me that patient is still having brown stool.  We added a tapwater enema to see if this would help, but at time of my exam she is still passing bloody stool.  Do not think she is emptied out enough for colonoscopy today.  No new complaints or concerns per patient.  Where she will stay again overnight for further prep.   Objective   Vital signs in last 24 hours: Temp:  [97.7 F (36.5 C)-99.3 F (37.4 C)] 98.5 F (36.9 C) (09/11 0159) Pulse Rate:  [58-72] 58 (09/11 0159) Resp:  [14-17] 14 (09/11 0159) BP: (103-117)/(66-80) 108/68 (09/11 0159) SpO2:  [100 %] 100 % (09/10 1403) Weight:  [101.9 kg] 101.9 kg (09/10 1413) Last BM Date : 08/17/24 General: AA female in NAD Heart:  Regular rate and rhythm; no murmurs Lungs: Respirations even and unlabored, lungs CTA bilaterally Abdomen:  Soft, nontender and nondistended. Normal bowel sounds. Psych:  Cooperative. Normal mood and affect.  Intake/Output from previous day: 09/10 0701 - 09/11 0700 In: 1516.8 [P.O.:1410; I.V.:106.8] Out: 1500 [Urine:1500]   Assessment / Plan:   Assessment: 1.  Screening for colorectal cancer: Patient is 13 never had screening for colon cancer 2.  Multiple sclerosis 3.  Chronic constipation: Managed with MiraLAX  but had not had a bowel movement 3 days when she presented to the hospital 4.  PE on Eliquis : Eliquis  has been on hold since 9/9 5.  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  Change in bowel habits: Discussed with Dr. Wilhelmenia in July, changed to little pebbles, now better with MiraLAX  but still ranging towards constipation as above 7.  Blood in stool: Patient described this back in July when she saw Dr. Wilhelmenia since starting blood thinner which she attributes to  hemorrhoids, nothing recently  Plan: 1.  Patient was admitted to the hospital on 08/16/2024 with plans for colonoscopy with Dr. Legrand today, unfortunately she was not fully prepped this morning and continued to have muddy stool.  Plans were made to change her colonoscopy to tomorrow 08/18/2024 with Dr. Avram. 2.  Have reordered GoLytely  prep which she will drink this afternoon 3.  Patient will be on a clear liquid diet and have added some boost/breeze to help her given her diabetes 4.  Patient be n.p.o. by 8:00 tomorrow morning with plans for procedure around 1230. 5.  Patient will be discharged home tomorrow after time of colonoscopy.    LOS: 1 day   Ebony Warren  08/17/2024, 9:27 AM  ____________________________________________________________  I have taken an interval history, thoroughly reviewed the chart and examined the patient. I agree with the Advanced Practitioner's note, impression and recommendations, and have recorded additional findings, impressions and recommendations below. I performed a substantive portion of this encounter (>50% time spent), including a complete performance of the medical decision making.  My additional thoughts are as follows:  Patient clinically stable, but bowel preparation so far is inadequate for a colonoscopy. She will stay over until tomorrow (and be converted from observation to full admission), consume more bowel preparation, and have a colonoscopy with Dr. Avram tomorrow. To clarify, this is a diagnostic colonoscopy primarily for rectal bleeding, also for constipation. Ebony Warren was very understanding of this and appreciative for the care. _________________  This consultation required a low  degree of medical decision making due to the nature and complexity of the acute condition(s) being evaluated as well as the patient's medical comorbidities.  Ebony Warren  III [phone]  ____________________________________________________________

## 2024-08-17 NOTE — Plan of Care (Signed)

## 2024-08-17 NOTE — Care Management Obs Status (Signed)
 MEDICARE OBSERVATION STATUS NOTIFICATION   Patient Details  Name: BAILEE METTER MRN: [id number] Date of Birth: 06-01-1976   Medicare Observation Status Notification Given:  Yes    Doneta Glenys DASEN, RN 08/17/2024, 3:21 PM

## 2024-08-17 NOTE — H&P (View-Only) (Signed)
 Progress Note   Subjective  Day #2  Chief Complaint: Rectal bleeding change in bowel habits and no prior screening colonoscopy  This morning, unfortunately, nursing staff alerted me that patient is still having brown stool.  We added a tapwater enema to see if this would help, but at time of my exam she is still passing bloody stool.  Do not think she is emptied out enough for colonoscopy today.  No new complaints or concerns per patient.  Where she will stay again overnight for further prep.   Objective   Vital signs in last 24 hours: Temp:  [97.7 F (36.5 C)-99.3 F (37.4 C)] 98.5 F (36.9 C) (09/11 0159) Pulse Rate:  [58-72] 58 (09/11 0159) Resp:  [14-17] 14 (09/11 0159) BP: (103-117)/(66-80) 108/68 (09/11 0159) SpO2:  [100 %] 100 % (09/10 1403) Weight:  [101.9 kg] 101.9 kg (09/10 1413) Last BM Date : 08/17/24 General: AA female in NAD Heart:  Regular rate and rhythm; no murmurs Lungs: Respirations even and unlabored, lungs CTA bilaterally Abdomen:  Soft, nontender and nondistended. Normal bowel sounds. Psych:  Cooperative. Normal mood and affect.  Intake/Output from previous day: 09/10 0701 - 09/11 0700 In: 1516.8 [P.O.:1410; I.V.:106.8] Out: 1500 [Urine:1500]   Assessment / Plan:   Assessment: 1.  Screening for colorectal cancer: Patient is 13 never had screening for colon cancer 2.  Multiple sclerosis 3.  Chronic constipation: Managed with MiraLAX  but had not had a bowel movement 3 days when she presented to the hospital 4.  PE on Eliquis : Eliquis  has been on hold since 9/9 5.  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  Change in bowel habits: Discussed with Dr. Wilhelmenia in July, changed to little pebbles, now better with MiraLAX  but still ranging towards constipation as above 7.  Blood in stool: Patient described this back in July when she saw Dr. Wilhelmenia since starting blood thinner which she attributes to  hemorrhoids, nothing recently  Plan: 1.  Patient was admitted to the hospital on 08/16/2024 with plans for colonoscopy with Dr. Legrand today, unfortunately she was not fully prepped this morning and continued to have muddy stool.  Plans were made to change her colonoscopy to tomorrow 08/18/2024 with Dr. Avram. 2.  Have reordered GoLytely  prep which she will drink this afternoon 3.  Patient will be on a clear liquid diet and have added some boost/breeze to help her given her diabetes 4.  Patient be n.p.o. by 8:00 tomorrow morning with plans for procedure around 1230. 5.  Patient will be discharged home tomorrow after time of colonoscopy.    LOS: 1 day   Delon Hendricks Failing  08/17/2024, 9:27 AM  ____________________________________________________________  I have taken an interval history, thoroughly reviewed the chart and examined the patient. I agree with the Advanced Practitioner's note, impression and recommendations, and have recorded additional findings, impressions and recommendations below. I performed a substantive portion of this encounter (>50% time spent), including a complete performance of the medical decision making.  My additional thoughts are as follows:  Patient clinically stable, but bowel preparation so far is inadequate for a colonoscopy. She will stay over until tomorrow (and be converted from observation to full admission), consume more bowel preparation, and have a colonoscopy with Dr. Avram tomorrow. To clarify, this is a diagnostic colonoscopy primarily for rectal bleeding, also for constipation. Ebony Warren was very understanding of this and appreciative for the care. _________________  This consultation required a low  degree of medical decision making due to the nature and complexity of the acute condition(s) being evaluated as well as the patient's medical comorbidities.  Ebony LITTIE Brand  Warren [phone]  ____________________________________________________________

## 2024-08-18 ENCOUNTER — Observation Stay (HOSPITAL_COMMUNITY): Admitting: Anesthesiology

## 2024-08-18 ENCOUNTER — Encounter (HOSPITAL_COMMUNITY)
Admission: RE | Disposition: A | Discharge status: Home / Self Care | Payer: Self-pay | Source: Home / Self Care | Attending: Gastroenterology

## 2024-08-18 ENCOUNTER — Encounter (HOSPITAL_COMMUNITY): Payer: Self-pay | Admitting: Gastroenterology

## 2024-08-18 DIAGNOSIS — K648 Other hemorrhoids: Secondary | ICD-10-CM | POA: Diagnosis not present

## 2024-08-18 DIAGNOSIS — K562 Volvulus: Secondary | ICD-10-CM | POA: Diagnosis not present

## 2024-08-18 DIAGNOSIS — Z87891 Personal history of nicotine dependence: Secondary | ICD-10-CM | POA: Diagnosis not present

## 2024-08-18 DIAGNOSIS — R194 Change in bowel habit: Secondary | ICD-10-CM | POA: Diagnosis not present

## 2024-08-18 DIAGNOSIS — I10 Essential (primary) hypertension: Secondary | ICD-10-CM

## 2024-08-18 DIAGNOSIS — K625 Hemorrhage of anus and rectum: Secondary | ICD-10-CM | POA: Diagnosis not present

## 2024-08-18 DIAGNOSIS — K644 Residual hemorrhoidal skin tags: Secondary | ICD-10-CM | POA: Diagnosis not present

## 2024-08-18 DIAGNOSIS — Q438 Other specified congenital malformations of intestine: Secondary | ICD-10-CM | POA: Diagnosis not present

## 2024-08-18 DIAGNOSIS — F418 Other specified anxiety disorders: Secondary | ICD-10-CM

## 2024-08-18 DIAGNOSIS — Z1211 Encounter for screening for malignant neoplasm of colon: Secondary | ICD-10-CM | POA: Diagnosis not present

## 2024-08-18 DIAGNOSIS — E119 Type 2 diabetes mellitus without complications: Secondary | ICD-10-CM | POA: Diagnosis not present

## 2024-08-18 HISTORY — PX: COLONOSCOPY: SHX5424

## 2024-08-18 SURGERY — COLONOSCOPY
Anesthesia: Monitor Anesthesia Care

## 2024-08-18 MED ORDER — LACTATED RINGERS IV SOLN: INTRAVENOUS | Status: DC

## 2024-08-18 MED ORDER — MIDAZOLAM HCL 5 MG/5ML IJ SOLN
INTRAMUSCULAR | Status: DC | PRN
  Administered 2024-08-18: 2 mg via INTRAVENOUS

## 2024-08-18 MED ORDER — ONDANSETRON HCL 4 MG/2ML IJ SOLN
INTRAMUSCULAR | Status: DC | PRN
  Administered 2024-08-18: 4 mg via INTRAVENOUS

## 2024-08-18 MED ORDER — MIDAZOLAM HCL 2 MG/2ML IJ SOLN
INTRAMUSCULAR | Status: AC
  Filled 2024-08-18: qty 2

## 2024-08-18 MED ORDER — SODIUM CHLORIDE 0.9 % IV SOLN: INTRAVENOUS | Status: DC

## 2024-08-18 MED ORDER — LIDOCAINE 2% (20 MG/ML) 5 ML SYRINGE
INTRAMUSCULAR | Status: DC | PRN
  Administered 2024-08-18: 60 mg via INTRAVENOUS

## 2024-08-18 MED ORDER — PROPOFOL 500 MG/50ML IV EMUL
INTRAVENOUS | Status: DC | PRN
  Administered 2024-08-18: 20 mg via INTRAVENOUS
  Administered 2024-08-18: 150 ug/kg/min via INTRAVENOUS

## 2024-08-18 NOTE — Plan of Care (Signed)

## 2024-08-18 NOTE — Transfer of Care (Signed)
 Immediate Anesthesia Transfer of Care Note  Patient: Ebony Warren  Procedure(s) Performed: Procedure(s): COLONOSCOPY (N/A)  Patient Location: PACU  Anesthesia Type:MAC  Level of Consciousness:  sedated, patient cooperative and responds to stimulation  Airway & Oxygen  Therapy:Patient Spontanous Breathing and Patient connected to face mask oxgen  Post-op Assessment:  Report given to PACU RN and Post -op Vital signs reviewed and stable  Post vital signs:  Reviewed and stable  Last Vitals:  Vitals:   08/18/24 1310 08/18/24 1412  BP: 127/69 (!) 91/45  Pulse: 63 77  Resp: 18 19  Temp: 36.4 C   SpO2: 100% 98%    Complications: No apparent anesthesia complications

## 2024-08-18 NOTE — Anesthesia Preprocedure Evaluation (Addendum)
 Anesthesia Evaluation  Patient identified by MRN, date of birth, ID band Patient awake    Reviewed: Allergy & Precautions, NPO status , Patient's Chart, lab work & pertinent test results  History of Anesthesia Complications Negative for: history of anesthetic complications  Airway Mallampati: I  TM Distance: >3 FB Neck ROM: Full    Dental  (+) Dental Advisory Given   Pulmonary former smoker, PE   breath sounds clear to auscultation       Cardiovascular hypertension, Pt. on medications + DVT   Rhythm:Regular Rate:Normal     Neuro/Psych  Headaches PSYCHIATRIC DISORDERS (ADD) Anxiety Depression    H/o MS: wheelchair    GI/Hepatic Neg liver ROS,GERD  Medicated,,  Endo/Other  diabetes, Oral Hypoglycemic Agents  BMI 34  Renal/GU negative Renal ROS     Musculoskeletal  (+) Arthritis ,    Abdominal   Peds  Hematology Eliquis : last taken 08/14/2024   Anesthesia Other Findings   Reproductive/Obstetrics                              Anesthesia Physical Anesthesia Plan  ASA: 3  Anesthesia Plan: MAC   Post-op Pain Management: Minimal or no pain anticipated   Induction:   PONV Risk Score and Plan: 2 and Treatment may vary due to age or medical condition  Airway Management Planned: Natural Airway and Simple Face Mask  Additional Equipment: None  Intra-op Plan:   Post-operative Plan:   Informed Consent: I have reviewed the patients History and Physical, chart, labs and discussed the procedure including the risks, benefits and alternatives for the proposed anesthesia with the patient or authorized representative who has indicated his/her understanding and acceptance.     Dental advisory given  Plan Discussed with: CRNA and Surgeon  Anesthesia Plan Comments:          Anesthesia Quick Evaluation

## 2024-08-18 NOTE — Interval H&P Note (Signed)
 History and Physical Interval Note:  08/18/2024 1:16 PM  Ebony Warren  has presented today for surgery, with the diagnosis of R19.4,k62.5.  The various methods of treatment have been discussed with the patient and family. After consideration of risks, benefits and other options for treatment, the patient has consented to  Procedure(s): COLONOSCOPY (N/A) as a surgical intervention.  The patient's history has been reviewed, patient examined, no change in status, stable for surgery.  I have reviewed the patient's chart and labs.  Questions were answered to the patient's satisfaction.     Lupita Commander

## 2024-08-18 NOTE — Anesthesia Postprocedure Evaluation (Signed)
 Anesthesia Post Note  Patient: Ebony Warren  Procedure(s) Performed: COLONOSCOPY     Patient location during evaluation: Phase II Anesthesia Type: MAC Level of consciousness: awake and alert, oriented, patient cooperative and patient remains intubated per anesthesia plan Pain management: pain level controlled Vital Signs Assessment: post-procedure vital signs reviewed and stable Respiratory status: spontaneous breathing and nonlabored ventilation Cardiovascular status: blood pressure returned to baseline and stable Postop Assessment: no apparent nausea or vomiting Anesthetic complications: no   No notable events documented.  Last Vitals:  Vitals:   08/18/24 1430 08/18/24 1440  BP: 114/68 129/70  Pulse: 66 61  Resp: 18 17  Temp:    SpO2: 98% 100%    Last Pain:  Vitals:   08/18/24 1440  TempSrc:   PainSc: 0-No pain                 Myelle Poteat,E. Jalyn Dutta

## 2024-08-18 NOTE — Plan of Care (Signed)
 Pt received, educated on, and understands AVS discharge instructions.  Pt IV acceses dc'd.

## 2024-08-18 NOTE — Discharge Instructions (Signed)
YOU HAD AN ENDOSCOPIC PROCEDURE TODAY: Refer to the procedure report and other information in the discharge instructions given to you for any specific questions about what was found during the examination. If this information does not answer your questions, please call Dr. Celesta Aver office at [phone] to clarify.   YOU SHOULD EXPECT: Some feelings of bloating in the abdomen. Passage of more gas than usual. Walking can help get rid of the air that was put into your GI tract during the procedure and reduce the bloating. If you had a lower endoscopy (such as a colonoscopy or flexible sigmoidoscopy) you may notice spotting of blood in your stool or on the toilet paper. Some abdominal soreness may be present for a day or two, also.  DIET: Your first meal following the procedure should be a light meal and then it is ok to progress to your normal diet. A half-sandwich or bowl of soup is an example of a good first meal. Heavy or fried foods are harder to digest and may make you feel nauseous or bloated. Drink plenty of fluids but you should avoid alcoholic beverages for 24 hours.   ACTIVITY: Your care partner should take you home directly after the procedure. You should plan to take it easy, moving slowly for the rest of the day. You can resume normal activity the day after the procedure however YOU SHOULD NOT DRIVE, use power tools, machinery or perform tasks that involve climbing or major physical exertion for 24 hours (because of the sedation medicines used during the test).   SYMPTOMS TO REPORT IMMEDIATELY: A gastroenterologist can be reached at any hour. Please call [phone]  for any of the following symptoms:  Following lower endoscopy (colonoscopy, flexible sigmoidoscopy) Excessive amounts of blood in the stool  Significant tenderness, worsening of abdominal pains  Swelling of the abdomen that is new, acute  Fever of 100 or higher

## 2024-08-18 NOTE — Op Note (Addendum)
 [facility] Patient Name: Ebony Warren Procedure Date: 08/18/2024 MRN: [id number] Attending MD: Lupita FORBES Commander , MD, [id number] Date of Birth: 02/13/1976 CSN: [id number] Age: 48 Admit Type: Inpatient Procedure:                Colonoscopy Indications:              Rectal bleeding, Change in bowel habits Providers:                Lupita CHARLENA Commander, MD, Ozell Pouch, Haskel Chris, Technician Referring MD:              Medicines:                Monitored Anesthesia Care Complications:            No immediate complications. Estimated Blood Loss:     Estimated blood loss: none. Procedure:                Pre-Anesthesia Assessment:                           - Prior to the procedure, a History and Physical                            was performed, and patient medications and                            allergies were reviewed. The patient's tolerance of                            previous anesthesia was also reviewed. The risks                            and benefits of the procedure and the sedation                            options and risks were discussed with the patient.                            All questions were answered, and informed consent                            was obtained. Prior Anticoagulants: The patient                            last took Eliquis  (apixaban ) 3 days prior to the                            procedure. ASA Grade Assessment: III - A patient                            with severe systemic disease. After reviewing the  risks and benefits, the patient was deemed in                            satisfactory condition to undergo the procedure.                           After obtaining informed consent, the colonoscope                            was passed under direct vision. Throughout the                            procedure, the patient's blood pressure, pulse, and                             oxygen  saturations were monitored continuously. The                            [id number] ([id number]) was introduced through the anus                            and advanced to the the cecum, identified by                            appendiceal orifice and ileocecal valve. The                            colonoscopy was somewhat difficult due to a                            redundant colon and significant looping. Successful                            completion of the procedure was aided by                            straightening and shortening the scope to obtain                            bowel loop reduction and applying abdominal                            pressure. The patient tolerated the procedure well.                            The quality of the bowel preparation was good. The                            ileocecal valve, appendiceal orifice, and rectum                            were photographed. The bowel preparation used was  SUPREP via extended prep with split dose                            instruction. Scope In: 1:36:36 PM Scope Out: 2:05:56 PM Scope Withdrawal Time: 0 hours 15 minutes 17 seconds  Total Procedure Duration: 0 hours 29 minutes 20 seconds  Findings:      Skin tags were found on perianal exam.      Non-bleeding internal hemorrhoids were found during retroflexion. The       hemorrhoids were small.      The colon (entire examined portion) was moderately redundant.      The exam was otherwise without abnormality on direct and retroflexion       views. Impression:               - Perianal skin tags found on perianal exam.                           - Non-bleeding internal hemorrhoids. Small. Suspect                            constipation, straining causing slight rectal                            bleeding.                           - Redundant colon.                           - The examination was otherwise normal on direct                             and retroflexion views.                           - No specimens collected. Moderate Sedation:      Not Applicable - Patient had care per Anesthesia. Recommendation:           - Discharge patient to home after return to floor.                           - Patient has a contact number available for                            emergencies. The signs and symptoms of potential                            delayed complications were discussed with the                            patient. Return to normal activities tomorrow.                            Written discharge instructions were provided to the  patient.                           - Resume previous diet.                           - Continue present medications.                           - Increase MiraLax  to 2 doses daily and use                            Dulcolax 1-2 every 2-3 days as needed also                           - Resume Eliquis  (apixaban ) at prior dose tomorrow.                           I have updated mother by phone.                           - Repeat colonoscopy in 10 years for screening                            purposes. Dr. Legrand Procedure Code(s):        --- Professional ---                           [phone], Colonoscopy, flexible; diagnostic, including                            collection of specimen(s) by brushing or washing,                            when performed (separate procedure) Diagnosis Code(s):        --- Professional ---                           K64.8, Other hemorrhoids                           K64.4, Residual hemorrhoidal skin tags                           K62.5, Hemorrhage of anus and rectum                           R19.4, Change in bowel habit                           Q43.8, Other specified congenital malformations of                            intestine CPT copyright 2022 [employer]. All rights reserved. The codes documented  in this report are preliminary and upon coder review may  be revised to meet current compliance requirements. Lupita FORBES Commander, MD 08/18/2024  2:20:01 PM This report has been signed electronically. Number of Addenda: 0

## 2024-08-20 ENCOUNTER — Encounter (HOSPITAL_COMMUNITY): Payer: Self-pay | Admitting: Internal Medicine

## 2024-08-21 NOTE — Discharge Summary (Signed)
 Discharge Summary  Patient admitted for diagnostic colonoscopy given a change in bowel habits and bloody stools.  She prep successfully here in the hospital after 2-day bowel prep and underwent colonoscopy on 08/21/2024.  Colonoscopy 08/16/2024 Impression-perianal skin tags, nonbleeding internal hemorrhoids, small suspect constipation and straining, redundant colon, otherwise normal  Recommendations: Increase MiraLAX  to 2 doses daily and use Dulcolax 1-2 every 2 to 3 days as needed, resume apixaban  tomorrow and repeat colonoscopy in 10 years.  Patient be discharged home and restart Eliquis  tomorrow.  She can call our clinic with any questions.  Delon Failing, PA-C

## 2024-08-25 ENCOUNTER — Other Ambulatory Visit: Payer: Self-pay

## 2024-08-25 NOTE — Progress Notes (Signed)
 This patient is appearing on a report for being at risk of failing the glycemic status, kidney evaluation, and statin use in diabetes quality measures for this year.   Patient has a diagnosis of insulin  resistance (not T2DM) and is taking metformin  XR 500 mg BID for diabetes prevention and weight loss. She is not currently prescribed a statin, but 10-yr ASCVD risk score is 2.3% and her last LDL-C in 2024 was 85 mg/dL below goal < 899 mg/dL.   Patient is following with Dr. Francyne at [facility], and he was planning to repeat metabolic labs at 0/76/74 appointment. Will send provider a message to ensure patient can have a UACR, A1C, and lipid panel completed at this appointment.   Will also send patient a MyChart message to schedule health maintenance/routine follow-up with [facility] (due ~08/17/24).   Lorain Baseman, PharmD [facility] [phone]

## 2024-08-29 ENCOUNTER — Encounter (INDEPENDENT_AMBULATORY_CARE_PROVIDER_SITE_OTHER): Payer: Self-pay | Admitting: Internal Medicine

## 2024-08-29 ENCOUNTER — Ambulatory Visit (INDEPENDENT_AMBULATORY_CARE_PROVIDER_SITE_OTHER): Admitting: Internal Medicine

## 2024-08-29 VITALS — BP 99/67 | HR 63 | Temp 98.1°F | Ht 68.0 in | Wt 220.0 lb

## 2024-08-29 DIAGNOSIS — G35 Multiple sclerosis: Secondary | ICD-10-CM | POA: Diagnosis not present

## 2024-08-29 DIAGNOSIS — Z6834 Body mass index (BMI) 34.0-34.9, adult: Secondary | ICD-10-CM

## 2024-08-29 DIAGNOSIS — E88819 Insulin resistance, unspecified: Secondary | ICD-10-CM | POA: Diagnosis not present

## 2024-08-29 DIAGNOSIS — E66811 Obesity, class 1: Secondary | ICD-10-CM | POA: Diagnosis not present

## 2024-08-29 DIAGNOSIS — I10 Essential (primary) hypertension: Secondary | ICD-10-CM | POA: Diagnosis not present

## 2024-08-29 NOTE — Assessment & Plan Note (Signed)
 Progressive muscle weakness and hand stiffness with decreased grip strength and weaker legs. No current physical therapy. - Discuss physical therapy options with primary care provider in October. - Consider starting physical therapy at home for initial conditioning. - Emphasize importance of protein intake to prevent muscle loss.

## 2024-08-29 NOTE — Assessment & Plan Note (Signed)
 Blood pressure is low normal today.  She has lost approximately 30% of total body weight.  We will discontinue losartan 

## 2024-08-29 NOTE — Assessment & Plan Note (Signed)
 Weight: decrease of 91.7 lb (29.4%) over 1 year, 10 months  Start: 10/27/2022 311 lb 11.2 oz (141.4 kg) (H)  End: 08/29/2024 220 lb (99.8 kg)   Obesity with significant weight loss and ongoing weight management Significant weight loss of 91 pounds, approximately 30% of total body weight, from a peak weight of 311 pounds. Current dietary intake includes three meals a day with a focus on protein intake to prevent muscle loss. - Encourage use of whey isolate protein powder in grits to increase protein intake. - Suggest alternative protein sources such as [place] bacon and eggs. - Ensure protein intake of at least 30 grams per meal. - Encourage three meals a day with protein snacks if hungry between meals. - She would benefit from strengthening exercises and physical therapy.  She will be seeing her primary care team. -Unfortunately we cannot use bioimpedance information

## 2024-08-29 NOTE — Progress Notes (Signed)
 Office: [phone]  /  Fax: [phone]  Weight Summary and Body Composition Analysis (BIA)  Vitals Temp: 98.1 F (36.7 C) BP: 99/67 Pulse Rate: 63 SpO2: 99 %   Anthropometric Measurements Height: 5' 8 (1.727 m) Weight: 220 lb (99.8 kg) BMI (Calculated): 33.46 Weight at Last Visit: 7 lb Weight Lost Since Last Visit: 0 lb Weight Gained Since Last Visit: 0 lb Starting Weight: 309 lb Total Weight Loss (lbs): 89 lb (40.4 kg) Peak Weight: 312 lb   No data recorded  RMR: 1426  Today's Visit #: 20  Starting Date: 02/22/23   Subjective   Chief Complaint: Obesity  Interval History Discussed the use of AI scribe software for clinical note transcription with the patient, who gave verbal consent to proceed.  History of Present Illness Ebony Warren is a 48 year old female with multiple sclerosis who presents for weight management and follow-up.  She has experienced a weight loss of seven pounds recently without any changes in her routine. Her current diet includes three meals a day. Breakfast typically consists of two pork sausages and a small bowl of cheese grits. Lunch usually includes [place] or chicken sandwiches made with soft wheat bread, accompanied by a piece of fruit or a side salad. Dinner is a shared meal with her family, such as roasted [place] wings with white rice and asparagus.  She has lost a total of 91 pounds, which is 30% of her total body weight. Her family is proud of her weight loss. However, her legs have become weaker, affecting her ability to transfer. She is not currently receiving physical therapy. Her right hand has also become weaker, and she is using five-pound weights to strengthen it.  She is currently taking topiramate  50 mg twice a day.  She is also on metformin   In the review of symptoms, her sleep is going well. She has noticed a decrease in strength in her legs and right hand, with her grip being weak. She is right-handed  and has difficulty holding things. No changes in her multiple sclerosis or blood clot treatment.     Challenges affecting patient progress: orthopedic problems, medical conditions or chronic pain affecting mobility and medical comorbidities.    Pharmacotherapy for weight management: She is currently taking Metformin  with diabetes as primary indication with adequate clinical response  and without side effects. and Topiramate  (off label use, single agent) with adequate clinical response  and without side effects..   Assessment and Plan   Treatment Plan For Obesity:  Recommended Dietary Goals  Hazelynn is currently in the action stage of change. As such, her goal is to continue weight management plan. She has agreed to: continue current plan  Behavioral Health and Counseling  We discussed the following behavioral modification strategies today: increasing lean protein intake to established goals and continue to work on maintaining a reduced calorie state, getting the recommended amount of protein, incorporating whole foods, making healthy choices, staying well hydrated and practicing mindfulness when eating..  Additional education and resources provided today: None  Recommended Physical Activity Goals  Kayci has been advised to work up to 150 minutes of moderate intensity aerobic activity a week and strengthening exercises 2-3 times per week for cardiovascular health, weight loss maintenance and preservation of muscle mass.  She has agreed to :  Increase the intensity, frequency or duration of strengthening exercises   Medical Interventions and Pharmacotherapy  We discussed various medication options to help Lakeesha with her weight loss efforts and  we both agreed to : Adequate clinical response to anti-obesity medication, continue current regimen  Associated Conditions Impacted by Obesity Treatment  Assessment & Plan Essential hypertension Blood pressure is low normal today.   She has lost approximately 30% of total body weight.  We will discontinue losartan  Insulin  resistance  Class 1 obesity with serious comorbidity and body mass index (BMI) of 34.0 to 34.9 in adult, unspecified obesity type Weight: decrease of 91.7 lb (29.4%) over 1 year, 10 months  Start: 10/27/2022 311 lb 11.2 oz (141.4 kg) (H)  End: 08/29/2024 220 lb (99.8 kg)   Obesity with significant weight loss and ongoing weight management Significant weight loss of 91 pounds, approximately 30% of total body weight, from a peak weight of 311 pounds. Current dietary intake includes three meals a day with a focus on protein intake to prevent muscle loss. - Encourage use of whey isolate protein powder in grits to increase protein intake. - Suggest alternative protein sources such as [place] bacon and eggs. - Ensure protein intake of at least 30 grams per meal. - Encourage three meals a day with protein snacks if hungry between meals. - She would benefit from strengthening exercises and physical therapy.  She will be seeing her primary care team. -Unfortunately we cannot use bioimpedance information Multiple sclerosis Progressive muscle weakness and hand stiffness with decreased grip strength and weaker legs. No current physical therapy. - Discuss physical therapy options with primary care provider in October. - Consider starting physical therapy at home for initial conditioning. - Emphasize importance of protein intake to prevent muscle loss.    Orders Placed This Encounter  Procedures   Lipid Panel With LDL/HDL Ratio    Release to patient:   Immediate   Vitamin B12    Release to patient:   Immediate   Hemoglobin A1c    Release to patient:   Immediate   CMP14+EGFR    Release to patient:   Immediate   Prealbumin       Objective   Physical Exam:  Blood pressure 99/67, pulse 63, temperature 98.1 F (36.7 C), height 5' 8 (1.727 m), weight 220 lb (99.8 kg), SpO2 99%. Body mass index is 33.45  kg/m.  General: She is overweight, cooperative, alert, well developed, and in no acute distress. PSYCH: Has normal mood, affect and thought process.   HEENT: EOMI, sclerae are anicteric. Lungs: Normal breathing effort, no conversational dyspnea. Extremities: No edema.  Neurologic: No gross sensory or motor deficits. No tremors or fasciculations noted.    Diagnostic Data Reviewed:  BMET    Component Value Date/Time   NA 139 05/19/2024 1455   NA 142 11/17/2023 0917   K 3.6 05/19/2024 1455   CL 109 05/19/2024 1455   CO2 23 05/19/2024 1455   GLUCOSE 82 05/19/2024 1455   BUN 15 05/19/2024 1455   BUN 12 11/17/2023 0917   CREATININE 0.70 05/19/2024 1455   CALCIUM 9.0 05/19/2024 1455   GFRNONAA >60 05/19/2024 1455   GFRAA 87 12/10/2020 1251   Lab Results  Component Value Date   HGBA1C 5.2 11/26/2023   HGBA1C 5.3 08/19/2022   Lab Results  Component Value Date   INSULIN  4.8 11/17/2023   INSULIN  25.0 (H) 02/23/2023   Lab Results  Component Value Date   TSH 1.690 02/23/2023   CBC    Component Value Date/Time   WBC 4.7 07/07/2024 0937   RBC 4.45 07/07/2024 0937   HGB 13.6 07/07/2024 0937   HGB 12.8 05/19/2024 1455  HGB 13.9 01/04/2024 1352   HCT 41.6 07/07/2024 0937   HCT 42.1 01/04/2024 1352   PLT 267.0 07/07/2024 0937   PLT 276 05/19/2024 1455   PLT 291 01/04/2024 1352   MCV 93.7 07/07/2024 0937   MCV 95 01/04/2024 1352   MCH 30.5 05/19/2024 1455   MCHC 32.6 07/07/2024 0937   RDW 14.3 07/07/2024 0937   RDW 13.0 01/04/2024 1352   Iron Studies    Component Value Date/Time   IRON 68 10/27/2022 1056   TIBC 313 10/27/2022 1056   FERRITIN 41 10/27/2022 1056   IRONPCTSAT 22 10/27/2022 1056   Lipid Panel     Component Value Date/Time   CHOL 149 02/23/2023 1305   TRIG 72 02/23/2023 1305   HDL 50 02/23/2023 1305   LDLCALC 85 02/23/2023 1305   Hepatic Function Panel     Component Value Date/Time   PROT 7.2 05/19/2024 1455   PROT 6.7 11/17/2023 0917    ALBUMIN 4.2 05/19/2024 1455   ALBUMIN 4.2 11/17/2023 0917   AST 7 (L) 05/19/2024 1455   ALT 5 05/19/2024 1455   ALKPHOS 68 05/19/2024 1455   BILITOT 0.3 05/19/2024 1455      Component Value Date/Time   TSH 1.690 02/23/2023 1305   Nutritional Lab Results  Component Value Date   VD25OH 55.6 02/23/2023   VD25OH 38.1 09/23/2021   VD25OH 34.1 12/22/2016    Medications: Outpatient Encounter Medications as of 08/29/2024  Medication Sig Note   acetaminophen  (TYLENOL ) 160 MG/5ML elixir Take 20 mLs by mouth 2 (two) times daily as needed for fever.    apixaban  (ELIQUIS ) 5 MG TABS tablet Take 1 tablet (5 mg total) by mouth 2 (two) times daily. 08/16/2024: Last taken 08/14/24, both AM and PM doses taken   ARIPiprazole  (ABILIFY ) 5 MG tablet Take 5 mg by mouth daily.    baclofen  (LIORESAL ) 20 MG tablet Take 1 tablet (20 mg total) by mouth 3 (three) times daily.    cholecalciferol  (VITAMIN D ) 1000 UNITS tablet Take 1,000 Units by mouth daily.    diphenhydrAMINE  (BENADRYL ) 25 mg capsule Take 25 mg by mouth every 6 (six) hours as needed for allergies.    DULoxetine  (CYMBALTA ) 30 MG capsule Take 30 mg by mouth daily.    LORazepam  (ATIVAN ) 1 MG tablet Take 1 tablet (1 mg total) by mouth daily.    losartan  (COZAAR ) 50 MG tablet Take 1 tablet (50 mg total) by mouth daily.    metFORMIN  (GLUCOPHAGE -XR) 500 MG 24 hr tablet Take 1 tablet (500 mg total) by mouth 2 (two) times daily with a meal.    Multiple Vitamins-Minerals (MULTIVITAMIN ADULT PO) Take 1 tablet by mouth daily.    nystatin  (MYCOSTATIN /NYSTOP ) powder Apply 1 Application topically 2 (two) times daily. (Patient taking differently: Apply 1 Application topically 2 (two) times daily as needed (skin irritation).)    ocrelizumab  (OCREVUS ) 300 MG/10ML injection Infuse 600 mg IV Every 6 months    oxybutynin  (DITROPAN -XL) 10 MG 24 hr tablet Take 1 tablet (10 mg total) by mouth daily as needed (to prevent incontinence).    pantoprazole  (PROTONIX ) 40 MG  tablet Take 1 tablet (40 mg total) by mouth daily. (Patient taking differently: Take 40 mg by mouth daily as needed (heartburn).)    polyethylene glycol powder (GLYCOLAX /MIRALAX ) 17 GM/SCOOP powder Take 17 g by mouth 2 (two) times daily as needed.    Potassium (POTASSIMIN PO) Take 1 tablet by mouth daily at 12 noon.    tiZANidine  (ZANAFLEX ) 4  MG tablet Take 1 tablet (4 mg total) by mouth 3 (three) times daily.    topiramate  (TOPAMAX ) 50 MG tablet Take 1 tablet (50 mg total) by mouth 2 (two) times daily.    Facility-Administered Encounter Medications as of 08/29/2024  Medication   diphenhydrAMINE  (BENADRYL ) injection 50 mg   famotidine  (PEPCID ) 20 mg in sodium chloride  0.9 % 50 mL IVPB   methylPREDNISolone  sodium succinate (SOLU-MEDROL ) 125 mg in sodium chloride  0.9 % 50 mL IVPB   ocrelizumab  (OCREVUS ) 600 mg in sodium chloride  0.9 % 250 mL     Follow-Up   No follow-ups on file.SABRA She was informed of the importance of frequent follow up visits to maximize her success with intensive lifestyle modifications for her multiple health conditions.  Attestation Statement   Reviewed by clinician on day of visit: allergies, medications, problem list, medical history, surgical history, family history, social history, and previous encounter notes.     Lucas Parker, MD

## 2024-08-31 ENCOUNTER — Encounter: Payer: Self-pay | Admitting: Diagnostic Neuroimaging

## 2024-09-01 NOTE — Telephone Encounter (Signed)
 Spoke w/Pt who stated around 08/16/24 her Right leg started feeling numb and she has little feeling in it. Also reported her Left leg is very stiff, her Right hand is very weak but her Right arm and Left hand and arm are ok. Pt stated she has had several urine incontinent episodes which is also new. Pt last Ocrevus  infusion was 06/19/24. Pt is asking for orders for mechanical lift and transfer chair that folds. Informed Pt will send message to Dr. Margaret regarding the changes and her requests. Pt voiced understanding and thanks for the call.

## 2024-09-05 ENCOUNTER — Other Ambulatory Visit: Payer: Self-pay | Admitting: Diagnostic Neuroimaging

## 2024-09-07 ENCOUNTER — Encounter: Payer: Self-pay | Admitting: Physician Assistant

## 2024-09-07 ENCOUNTER — Other Ambulatory Visit: Payer: Self-pay

## 2024-09-07 DIAGNOSIS — K862 Cyst of pancreas: Secondary | ICD-10-CM

## 2024-09-07 LAB — CMP14+EGFR
ALT: 6 IU/L (ref 0–32)
AST: 9 IU/L (ref 0–40)
Albumin: 4 g/dL (ref 3.9–4.9)
Alkaline Phosphatase: 81 IU/L (ref 41–116)
BUN/Creatinine Ratio: 16 (ref 9–23)
BUN: 13 mg/dL (ref 6–24)
Bilirubin Total: 0.4 mg/dL (ref 0.0–1.2)
CO2: 18 mmol/L — ABNORMAL LOW (ref 20–29)
Calcium: 9.4 mg/dL (ref 8.7–10.2)
Chloride: 107 mmol/L — ABNORMAL HIGH (ref 96–106)
Creatinine, Ser: 0.82 mg/dL (ref 0.57–1.00)
Globulin, Total: 2.4 g/dL (ref 1.5–4.5)
Glucose: 79 mg/dL (ref 70–99)
Potassium: 4.2 mmol/L (ref 3.5–5.2)
Sodium: 140 mmol/L (ref 134–144)
Total Protein: 6.4 g/dL (ref 6.0–8.5)
eGFR: 89 mL/min/1.73 (ref >59)

## 2024-09-07 LAB — VITAMIN B12: Vitamin B-12: 315 pg/mL (ref 232–1245)

## 2024-09-07 LAB — HEMOGLOBIN A1C
Est. average glucose Bld gHb Est-mCnc: 103 mg/dL
Hgb A1c MFr Bld: 5.2 % (ref 4.8–5.6)

## 2024-09-07 LAB — LIPID PANEL WITH LDL/HDL RATIO
Cholesterol, Total: 163 mg/dL (ref 100–199)
HDL: 57 mg/dL (ref >39)
LDL Chol Calc (NIH): 94 mg/dL (ref 0–99)
LDL/HDL Ratio: 1.6 ratio (ref 0.0–3.2)
Triglycerides: 59 mg/dL (ref 0–149)
VLDL Cholesterol Cal: 12 mg/dL (ref 5–40)

## 2024-09-07 LAB — PREALBUMIN: PREALBUMIN: 21 mg/dL (ref 12–34)

## 2024-09-07 NOTE — Telephone Encounter (Signed)
 Last filled by patient on 04/06/24 Last office visit : 10/18/23 Next office visit :09/12/24  Patient is not out of medication and plans to get her 90 day refills on 09/12/24 - her appointment date.

## 2024-09-11 ENCOUNTER — Ambulatory Visit (INDEPENDENT_AMBULATORY_CARE_PROVIDER_SITE_OTHER): Payer: Self-pay | Admitting: Internal Medicine

## 2024-09-11 ENCOUNTER — Encounter: Payer: Self-pay | Admitting: Diagnostic Neuroimaging

## 2024-09-11 MED ORDER — TOPIRAMATE 50 MG PO TABS: 50.0000 mg | ORAL_TABLET | Freq: Every day | ORAL | Status: DC

## 2024-09-11 NOTE — Telephone Encounter (Signed)
 Contacted by Dr. Francyne about slightly low bicarb levels in setting of topiramate  use. Ok to reduce topiramate  to 50mg  daily.  Meds ordered this encounter  Medications   topiramate  (TOPAMAX ) 50 MG tablet    Sig: Take 1 tablet (50 mg total) by mouth daily.   EDUARD FABIENE HANLON, MD 09/11/2024, 2:25 PM Certified in Neurology, Neurophysiology and Neuroimaging  [facility] [address] [place], [place] [address] [phone]

## 2024-09-11 NOTE — Telephone Encounter (Signed)
 Pls start process to order these via home health per Dr. Margaret.     I  called [employer].  She said that will need order F2F , height weight.  We last saw pt 10/2023 so will need appt.  Pt may have seen pcp, will check and see.  Trying to get DME Lift and foldable transfer chair.

## 2024-09-12 ENCOUNTER — Other Ambulatory Visit: Payer: Self-pay | Admitting: *Deleted

## 2024-09-12 ENCOUNTER — Encounter: Payer: Self-pay | Admitting: Adult Health

## 2024-09-12 ENCOUNTER — Ambulatory Visit: Admitting: Adult Health

## 2024-09-12 VITALS — BP 112/67 | HR 63

## 2024-09-12 DIAGNOSIS — R29898 Other symptoms and signs involving the musculoskeletal system: Secondary | ICD-10-CM

## 2024-09-12 DIAGNOSIS — R252 Cramp and spasm: Secondary | ICD-10-CM

## 2024-09-12 DIAGNOSIS — G35D Multiple sclerosis, unspecified: Secondary | ICD-10-CM

## 2024-09-12 DIAGNOSIS — N39498 Other specified urinary incontinence: Secondary | ICD-10-CM | POA: Diagnosis not present

## 2024-09-12 DIAGNOSIS — G8191 Hemiplegia, unspecified affecting right dominant side: Secondary | ICD-10-CM

## 2024-09-12 MED ORDER — OXYBUTYNIN CHLORIDE ER 10 MG PO TB24: 20.0000 mg | ORAL_TABLET | Freq: Every day | ORAL | 3 refills | Status: AC

## 2024-09-12 NOTE — Telephone Encounter (Signed)
 Completed and notified Sandy. Thank you.

## 2024-09-12 NOTE — Patient Instructions (Addendum)
 Your Plan:  Increase oxybutynin  to 20mg  daily   Continue baclofen  and tizanidine    Continue Ocrevus  infusions  You will be called to schedule an MR brain and cervical spine imaging   You will be called to set up home health physical and occupational therapies      Follow up in 6 months or call earlier if needed      Thank you for coming to see us  at [facility]. I hope we have been able to provide you high quality care today.  You may receive a patient satisfaction survey over the next few weeks. We would appreciate your feedback and comments so that we may continue to improve ourselves and the health of our patients.

## 2024-09-12 NOTE — Progress Notes (Signed)
 [facility]  PATIENT: Ebony Warren DOB: [birth year]  REFERRING CLINICIAN: Shawn Sick, MD   HISTORY FROM: patient  REASON FOR VISIT: follow up   HISTORICAL  CHIEF COMPLAINT:  Chief Complaint  Patient presents with   Multiple Sclerosis    Rm 3 alone Pt is well reports she is having more trouble with her legs giving out, especially her R leg.  She  is also incontinent and cannot control her bladder.     HISTORY OF PRESENT ILLNESS:   Update 09/12/2024 JM: Patient returns for follow-up visit after prior visit most 1 year ago.  She remains on Ocrevus  infusions with last infusion in July.  Notes gradual progression over the past couple of months and currently unable to move her right leg, limited use of right hand with increased stiffness and incontinent of urine.  She uses electric wheelchair, previously able to stand/pivot for transfers and now needs assistance as she has no use of her right leg, her mother will move her leg for her.  She has had multiple falls because of this which requires EMS assistance.  She does live with her mother who assists as needed but unable to pick her up off the ground. She is requesting a floor chair lift to assist getting her up after a fall and requesting a foldable transport chair as sometimes it is difficult to use electric wheelchair in certain places and can be challenging with transport.  She remains on baclofen  and tizanidine  3 times daily, denies side effects.  She remains on Topamax  50 mg nightly, recently decreased due to low bicarb level.  Denies any changes in vision.    UPDATE (10/18/23, VRP): Since last visit, doing well. Symptoms are stable. Severity is moderate. No alleviating or aggravating factors. Tolerating meds.    UPDATE (02/10/23, VRP): Since last visit, doing well. Symptoms are STABLE. Severity is moderate. No alleviating or aggravating factors. Tolerating ocrevus  and other meds.    UPDATE (05/26/22,  VRP): Since last visit, doing well. Symptoms are stable. No alleviating or aggravating factors. Tolerating ocrevus . Planning to go on cruise vacation in April 2024.   UPDATE (09/23/21, VRP): Since last visit, slightly more balance and gait issues. Has power chair, but it was defective on arrival, now waiting for repair. Tolerating meds.   UPDATE (03/31/21, VRP): Since last visit, here for evaluation for electric wheelchair / power chair again. Symptoms are progressive. Severity is moderate. No alleviating or aggravating factors. Tolerating meds / ocrevus . Here for face-face evaluation and mobility assessment for power wheelchair. Has multiple sclerosis and hemiplegia. Cannot perform ADLs due to mobility issues. Cannot propel manual wheelchair due to hand weakness. Living at home with son and mother. Has personal care aid. Not driving currently.   UPDATE (12/10/20, VRP): Since last visit, more left leg weakness and gait difficulty. Requesting evaluation for electric wheelchair / power chair. Symptoms are progressive. Severity is moderate. No alleviating or aggravating factors. Tolerating meds / ocrevus . Here for face-face evaluation of power wheelchair. Cannot perform ADLs due to mobility issues. Cannot propel manual wheelchair due to hand weakness. Living at home with son and mother. Has personal care aid. Not driving currently.   UPDATE (05/21/20, VRP): Since last visit, doing fair. More depression, spasms. Had event last week of body trembling, no LOC, lasting 10 minutes. Symptoms are progressive. Tolerating ocrevus  and muscle relaxers.  UPDATE (11/20/19, VRP): Here for face-face evaluation of wheelchair need. Currently using a wheelchair due to MS and mobility impairment,  now needs a replacement. Patient able to propel wheelchair. Overall, since last visit, doing fair. Continues on ocrevus ; had mild reaction at IV site last time. No other alleviating or aggravating factors. Tolerating meds.    UPDATE  (10/25/18, VRP): Since last visit, doing about the same. Symptoms are stable EXCEPT right hand weakness and coordination is worse. Some right shoulder twitching. Memory loss continues. No alleviating or aggravating factors. Tolerating meds.    UPDATE (04/18/18, VRP): Since last visit, doing well on ocrevus , except had another fall. Recently more urine incont, and right leg swelling. Tolerating meds. No alleviating or aggravating factors.   UPDATE (10/18/17, VRP): Since last visit, doing well. Tolerating ocrevus . No alleviating or aggravating factors. No new events. C/o cough and dry mouth issues.   UPDATE 05/19/17: Since last visit, now transitioned to ocrevus  (May 21, June 4). Did well with infusion. Spasms stable. Left leg slightly better. Bladder slightly better. Depression stable. Using rollator walker. No other triggering factors. Overall doing well.   UPDATE 02/08/17: Since last visit, MRI brain shows 1 new lesion. Here to discuss ocrevus . Also with more left leg pain, muscle spasms, gait diff, fatigue. Right ear pain better. Bladder incont slightly better with oxybutinin initially, but not anymore.   UPDATE 12/22/16: Since last visit, was doing well until Nov 2017 and then developed stabbing right ear pain (continues to present). Also with new and increasing spasm and cramps in right hand and left leg since Dec 2017. Continues with fatigue and bladder incontinence.  UPDATE 05/13/16: Since last visit, doing well. No new MS events or symptoms. In retrospect, may have lost vision in right eye (2015, and possibly March 2016), lasting 3-7 days. Didn't mention to me until now. Recently this was noted optometry and ophthalmology exams.   UPDATE 01/27/16: Since last visit, fatigue improved with adjusting medication timing. Bladder issues stable. Had UTI at last visit, tx'd with abx, now better.  UPDATE 10/22/15: Since last visit was doing well. Then last week had new onset weakness in legs, bladder  incontinence, strong smelling urine, and hand tremors. Now sxs almost fully resolved since yesterday.  UPDATE 05/01/15: Symptoms stable. Tolerating tecfidera .  UPDATE 01/25/15: Since last visit, had more problems for a few weeks (lower ext weakness, bladder incont), but now improved. Now on tecfidera  x 3 weeks. Some itching and stomach issues with tecfidera , but mild. Main issues now consist of lower extremity pain, spasms, urinary leakage, daytime fatigue.  UPDATE 10/31/14: Since last visit, sxs are stable. Test results and diagnosis reviewed. No new events. Using cane to walk.  UPDATE (10/02/14): 48 year old right-handed female here for evaluation of double vision. 2008 patient was 6 months pregnant with her son, when all of a sudden she didn't feel good. She noticed right hand clumsiness and incoordination. This lasted for approximately 9 months and then stopped. She did not seek medical attention for this problem. 2013 patient had onset of right arm weakness, right leg weakness, numbness on the right side, intermittent shaking sensation all over. Patient was evaluated in [place]  and diagnosed with possible stroke. 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 Patient did not recover right arm or leg strength. Her speech was also affected. October 2014 patient moved to [place] . 08/30/2014 patient was at home hanging clothes over the bathtub when she fell down. She is not sure what caused her fall. Her right leg may have given out. Patient fell and struck her head against the soap dish which broke off. Patient did not lose consciousness.  Patient's family advised her to go to the emergency room. Patient was evaluated with CT and MRI of the brain. No acute findings were found. MRI of the brain did show multiple white matter lesions suspicious for chronic multiple sclerosis versus chronic small vessel ischemic disease. Advised to follow-up in outpatient basis. Since this fall patient has noticed some  additional blurred vision, and double vision especially when looking to the left side.   REVIEW OF SYSTEMS: Full 14 system review of systems performed and negative except for: as per HPI.    ALLERGIES: Allergies  Allergen Reactions   Lortab [Hydrocodone -Acetaminophen ] Itching and Other (See Comments)    Stomach cramps   Percocet [Oxycodone -Acetaminophen ] Itching   Ms Contin  [Morphine ] Itching   Tomato Hives   Vitamin C Hives   Orange Fruit [Citrus] Hives, Itching and Rash   Other Itching and Rash    Narcotics - itching, rash Spicy foods - seizures   Roxicodone  [Oxycodone ] Itching and Rash    HOME MEDICATIONS: Outpatient Medications Prior to Visit  Medication Sig Dispense Refill   acetaminophen  (TYLENOL ) 160 MG/5ML elixir Take 20 mLs by mouth 2 (two) times daily as needed for fever.     apixaban  (ELIQUIS ) 5 MG TABS tablet Take 1 tablet (5 mg total) by mouth 2 (two) times daily. 60 tablet 2   ARIPiprazole  (ABILIFY ) 5 MG tablet Take 5 mg by mouth daily.     baclofen  (LIORESAL ) 20 MG tablet Take 1 tablet (20 mg total) by mouth 3 (three) times daily. 270 tablet 4   cholecalciferol  (VITAMIN D ) 1000 UNITS tablet Take 1,000 Units by mouth daily.     diphenhydrAMINE  (BENADRYL ) 25 mg capsule Take 25 mg by mouth every 6 (six) hours as needed for allergies.     DULoxetine  (CYMBALTA ) 30 MG capsule Take 30 mg by mouth daily.     LORazepam  (ATIVAN ) 1 MG tablet Take 1 tablet (1 mg total) by mouth daily. 14 tablet 0   metFORMIN  (GLUCOPHAGE -XR) 500 MG 24 hr tablet Take 1 tablet (500 mg total) by mouth 2 (two) times daily with a meal. 180 tablet 0   Multiple Vitamins-Minerals (MULTIVITAMIN ADULT PO) Take 1 tablet by mouth daily.     nystatin  (MYCOSTATIN /NYSTOP ) powder Apply 1 Application topically 2 (two) times daily. (Patient taking differently: Apply 1 Application topically 2 (two) times daily as needed (skin irritation).) 60 g 1   ocrelizumab  (OCREVUS ) 300 MG/10ML injection Infuse 600 mg IV Every  6 months 20 mL 1   pantoprazole  (PROTONIX ) 40 MG tablet Take 1 tablet (40 mg total) by mouth daily. (Patient taking differently: Take 40 mg by mouth daily as needed (heartburn).) 90 tablet 3   polyethylene glycol powder (GLYCOLAX /MIRALAX ) 17 GM/SCOOP powder Take 17 g by mouth 2 (two) times daily as needed. 765 g 1   Potassium (POTASSIMIN PO) Take 1 tablet by mouth daily at 12 noon.     tiZANidine  (ZANAFLEX ) 4 MG tablet Take 1 tablet (4 mg total) by mouth 3 (three) times daily. 270 tablet 4   topiramate  (TOPAMAX ) 50 MG tablet Take 1 tablet (50 mg total) by mouth daily.     oxybutynin  (DITROPAN -XL) 10 MG 24 hr tablet Take 1 tablet (10 mg total) by mouth daily as needed (to prevent incontinence). 90 tablet 3   Facility-Administered Medications Prior to Visit  Medication Dose Route Frequency Provider Last Rate Last Admin   diphenhydrAMINE  (BENADRYL ) injection 50 mg  50 mg Intravenous Once Penumalli, Vikram R, MD  famotidine  (PEPCID ) 20 mg in sodium chloride  0.9 % 50 mL IVPB  20 mg Intravenous Once Penumalli, Vikram R, MD       methylPREDNISolone  sodium succinate (SOLU-MEDROL ) 125 mg in sodium chloride  0.9 % 50 mL IVPB  125 mg Intravenous Once Penumalli, Vikram R, MD       ocrelizumab  (OCREVUS ) 600 mg in sodium chloride  0.9 % 250 mL  600 mg Intravenous Once Penumalli, Eduard SAUNDERS, MD        PAST MEDICAL HISTORY: Past Medical History:  Diagnosis Date   ADD (attention deficit disorder)    Anxiety and depression    Arthritis    Bradycardia    Constipation    Edema of both lower extremities    Encounter to establish care 01/22/2021   Family history of adverse reaction to anesthesia    nausea   Food allergy    Gallbladder problem    GERD (gastroesophageal reflux disease)    Headache    Hypertension    Multiple sclerosis    Obesity    Osteoarthritis    Pulmonary emboli (HCC)    Screening mammogram, encounter for 01/22/2021   Seizures (HCC)    told that they are not from the brain but  stress related   Swelling of lower extremity 03/24/2023   Vitamin D  deficiency     PAST SURGICAL HISTORY: Past Surgical History:  Procedure Laterality Date   BREAST REDUCTION SURGERY Bilateral 06/23/2021   Procedure: MAMMARY REDUCTION  (BREAST);  Surgeon: Elisabeth Craig RAMAN, MD;  Location[facility] OR;  Service: Plastics;  Laterality: Bilateral;   CHOLECYSTECTOMY     COLONOSCOPY N/A 08/18/2024   Procedure: COLONOSCOPY;  Surgeon: Avram Lupita BRAVO, MD;  Location: [facility];  Service: Gastroenterology;  Laterality: N/A;   KNEE SURGERY Bilateral    Arthroscopy    FAMILY HISTORY: Family History  Problem Relation Age of Onset   Hypertension Mother    Atrial fibrillation Mother    Thyroid  disease Mother    Endometrial cancer Mother    Obesity Mother    Colon polyps Mother    Hypertension Father    Atrial fibrillation Maternal Grandmother    Lung cancer Maternal Grandfather    Colon cancer Neg Hx    Esophageal cancer Neg Hx    Inflammatory bowel disease Neg Hx    Liver disease Neg Hx    Pancreatic cancer Neg Hx    Rectal cancer Neg Hx    Stomach cancer Neg Hx     SOCIAL HISTORY:  Social History   Socioeconomic History   Marital status: Single    Spouse name: Not on file   Number of children: 1   Years of education: College   Highest education level: Not on file  Occupational History    Employer: OTHER    Comment: disabled  Tobacco Use   Smoking status: Former    Current packs/day: 0.00    Average packs/day: 0.4 packs/day for 3.0 years (1.1 ttl pk-yrs)    Types: Cigarettes    Start date: 12/08/2003    Quit date: 12/07/2006    Years since quitting: 17.7   Smokeless tobacco: Never  Vaping Use   Vaping status: Never Used  Substance and Sexual Activity   Alcohol use: No    Alcohol/week: 0.0 standard drinks of alcohol    Comment: quit: 2013 (socially)   Drug use: Not Currently    Types: Marijuana    Comment: Smokes approx 1-2 per day   Sexual activity: Not  Currently  Other  Topics Concern   Not on file  Social History Narrative   ** Merged History Encounter **       Patient lives at home with family. Caffeine Use: drinks a cup of hot tea with tecfidera  in the am Patient is right handed. Patient has a college education   Social Drivers of Corporate investment banker Strain: Low Risk  (06/07/2024)   Overall Financial Resource Strain (CARDIA)    Difficulty of Paying Living Expenses: Not hard at all  Food Insecurity: No Food Insecurity (08/16/2024)   Hunger Vital Sign    Worried About Running Out of Food in the Last Year: Never true    Ran Out of Food in the Last Year: Never true  Transportation Needs: No Transportation Needs (08/16/2024)   PRAPARE - Administrator, Civil Service (Medical): No    Lack of Transportation (Non-Medical): No  Physical Activity: Inactive (06/07/2024)   Exercise Vital Sign    Days of Exercise per Week: 0 days    Minutes of Exercise per Session: 0 min  Stress: No Stress Concern Present (06/07/2024)   [employer] of Occupational Health - Occupational Stress Questionnaire    Feeling of Stress: Not at all  Social Connections: Socially Integrated (06/07/2024)   Social Connection and Isolation Panel    Frequency of Communication with Friends and Family: More than three times a week    Frequency of Social Gatherings with Friends and Family: More than three times a week    Attends Religious Services: More than 4 times per year    Active Member of [employer] or Organizations: Yes    Attends Banker Meetings: More than 4 times per year    Marital Status: Married  Catering manager Violence: Not At Risk (08/16/2024)   Humiliation, Afraid, Rape, and Kick questionnaire    Fear of Current or Ex-Partner: No    Emotionally Abused: No    Physically Abused: No    Sexually Abused: No     PHYSICAL EXAM  Vitals:   09/12/24 1310  BP: 112/67  Pulse: 63   No results found.  There is no height or weight on file to  calculate BMI.  GENERAL EXAM: Patient is in no distress; well developed, nourished and groomed; neck is supple  CARDIOVASCULAR: Regular rate and rhythm, no murmurs, no carotid bruits  NEUROLOGIC: MENTAL STATUS: awake, alert, language fluent, comprehension intact, naming intact, fund of knowledge appropriate; SLOW SCANNING SPEECH PATTERN CRANIAL NERVE: visual fields full to confrontation, extraocular muscles --> SACCADIC DYSMETRIA; NO PTOSIS; facial sensation symmetric, FACE --> DEC LEFT NL FOLD, hearing intact, palate elevates symmetrically, uvula midline, shoulder shrug symmetric, tongue midline. MOTOR: INCREASED TONE IN RUE and RLE; RUE 3 PROX AND 2-3 DISTAL WITH DECREASED GRIP STRENGTH; LUE 4/5; RLE 1/5 PROXIMAL; 2/5 ADF; LLE 4/5 SENSORY: DECR IN RIGHT HAND AND RIGHT FOOT TO ALL MODALITIES COORDINATION: BUE DYSMETRIA; SLOW FINGER AND FOOT TAP ON RIGHT REFLEXES: RUE 2+, LUE 1; BLE TRACE GAIT/STATION: IN POWER WHEELCHAIR    DIAGNOSTIC DATA (LABS, IMAGING, TESTING) - I reviewed patient records, labs, notes, testing and imaging myself where available.  Lab Results  Component Value Date   WBC 4.7 07/07/2024   HGB 13.6 07/07/2024   HCT 41.6 07/07/2024   MCV 93.7 07/07/2024   PLT 267.0 07/07/2024      Component Value Date/Time   NA 140 09/06/2024 0955   K 4.2 09/06/2024 0955   CL 107 (H)  09/06/2024 0955   CO2 18 (L) 09/06/2024 0955   GLUCOSE 79 09/06/2024 0955   GLUCOSE 82 05/19/2024 1455   BUN 13 09/06/2024 0955   CREATININE 0.82 09/06/2024 0955   CREATININE 0.70 05/19/2024 1455   CALCIUM 9.4 09/06/2024 0955   PROT 6.4 09/06/2024 0955   ALBUMIN 4.0 09/06/2024 0955   AST 9 09/06/2024 0955   AST 7 (L) 05/19/2024 1455   ALT 6 09/06/2024 0955   ALT 5 05/19/2024 1455   ALKPHOS 81 09/06/2024 0955   BILITOT 0.4 09/06/2024 0955   BILITOT 0.3 05/19/2024 1455   GFRNONAA >60 05/19/2024 1455   GFRAA 87 12/10/2020 1251   Lab Results  Component Value Date   CHOL 163 09/06/2024    HDL 57 09/06/2024   LDLCALC 94 09/06/2024   TRIG 59 09/06/2024   Lab Results  Component Value Date   HGBA1C 5.2 09/06/2024   Lab Results  Component Value Date   VITAMINB12 315 09/06/2024   Lab Results  Component Value Date   TSH 1.690 02/23/2023    Vit D, 25-Hydroxy  Date Value Ref Range Status  02/23/2023 55.6 30.0 - 100.0 ng/mL Final    Comment:    Vitamin D  deficiency has been defined by the Institute of Medicine and an Endocrine Society practice guideline as a level of serum 25-OH vitamin D  less than 20 ng/mL (1,2). The Endocrine Society went on to further define vitamin D  insufficiency as a level between 21 and 29 ng/mL (2). 1. IOM (Institute of Medicine). 2010. Dietary reference    intakes for calcium and D. [place]: The    [employer]. 2. Holick MF, Binkley [place], Bischoff-Ferrari HA, et al.    Evaluation, treatment, and prevention of vitamin D     deficiency: an Endocrine Society clinical practice    guideline. JCEM. 2011 Jul; 96(7):1911-30.   09/23/2021 38.1 30.0 - 100.0 ng/mL Final    Comment:    Vitamin D  deficiency has been defined by the Institute of Medicine and an Endocrine Society practice guideline as a level of serum 25-OH vitamin D  less than 20 ng/mL (1,2). The Endocrine Society went on to further define vitamin D  insufficiency as a level between 21 and 29 ng/mL (2). 1. IOM (Institute of Medicine). 2010. Dietary reference    intakes for calcium and D. [place]: The    [employer]. 2. Holick MF, Binkley [place], Bischoff-Ferrari HA, et al.    Evaluation, treatment, and prevention of vitamin D     deficiency: an Endocrine Society clinical practice    guideline. JCEM. 2011 Jul; 96(7):1911-30.   12/22/2016 34.1 30.0 - 100.0 ng/mL Final    Comment:    Vitamin D  deficiency has been defined by the Institute of Medicine and an Endocrine Society practice guideline as a level of serum 25-OH vitamin D  less than 20 ng/mL  (1,2). The Endocrine Society went on to further define vitamin D  insufficiency as a level between 21 and 29 ng/mL (2). 1. IOM (Institute of Medicine). 2010. Dietary reference    intakes for calcium and D. [place]: The    [employer]. 2. Holick MF, Binkley [place], Bischoff-Ferrari HA, et al.    Evaluation, treatment, and prevention of vitamin D     deficiency: an Endocrine Society clinical practice    guideline. JCEM. 2011 Jul; 96(7):1911-30.    Lymphs Abs  Date Value Ref Range Status  05/19/2024 1.5 0.7 - 4.0 K/uL Final  05/18/2024 1.7 0.7 - 4.0 K/uL Final  Lymphocytes Absolute  Date Value Ref Range Status  01/04/2024 1.7 0.7 - 3.1 x10E3/uL Final  10/18/2023 2.2 0.7 - 3.1 x10E3/uL Final  05/26/2022 1.6 0.7 - 3.1 x10E3/uL Final    11/19/18 MRI of the brain with and without contrast shows the following: 1.    T2/flair hyperintense foci in the brainstem, cerebellum, left thalamus and hemispheres in a pattern and configuration consistent with chronic demyelinating plaque associated with multiple sclerosis.  None of the foci appear to be acute and there were no new lesions compared to the 2018 MRI. 2.    There is a normal enhancement pattern and there are no acute findings.  11/19/18 MRI of the cervical spine with and without contrast shows the following: 1.     There are about 8 T2 hyperintense foci within the spinal cord consistent with chronic demyelinating plaque associated with multiple sclerosis.  None of the foci appears to be acute.  They were all present on the MRI dated 11/05/2015. 2.     There are no significant cervical spine degenerative changes. 3.     There is a normal enhancement pattern and no acute findings.  05/23/20 MRI brain MRI brain without contrast demonstrating: -Mild stable periventricular, pericallosal and subcortical foci of chronic demyelinating plaques. -No acute finding.  10/08/21 MRI brain (with and without) demonstrating: - Few small,  stable chronic demyelinating plaques.  - No acute plaques.   11/09/2023 MRI brain IMPRESSION: Multiple sclerosis pattern. Juxtacortical perirolandic plaque on both sides were likely present on prior but better seen today; other lesions are definitely stable and plaque burden is overall mild. Stable and normal brain volume.    Labs - ANA, ANCA, ACE, HIV, RPR - all negative  11/01/14 anti-JCV ab - 1.31 (H) positive  07/27/18 RLE u/s - No evidence of DVT and no evidence of venous insufficiency.       ASSESSMENT AND PLAN  48 y.o. year old female here with multiple focal neurologic attacks since 2008. Neurologic exam demonstrates long tract signs affecting the right arm and right leg more than left side. 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 Other labs negative.   On tecfidera  since Feb 2016. Had flare up symptoms in Nov 2016 and Nov-Dec 2017. Also new plaque in Jan 2018. Now on ocrevus  since May/June 2018.   Worsening RLE weakness with difficulty moving and worsening right hand stiffness over the past couple of months  Muscle spasms and leg pain (on baclofen , tizanidine  and gabapentin ).  Bladder incontinence --> using adult pads and oxybutinin   Depression stable (seeing Dr. Silva, [facility]).    Dx:  Multiple sclerosis - Plan: Ambulatory referral to Home Health, oxybutynin  (DITROPAN -XL) 10 MG 24 hr tablet, MR BRAIN W WO CONTRAST, MR CERVICAL SPINE W WO CONTRAST  Other urinary incontinence - Plan: MR BRAIN W WO CONTRAST, MR CERVICAL SPINE W WO CONTRAST  Spasticity - Plan: MR BRAIN W WO CONTRAST, MR CERVICAL SPINE W WO CONTRAST  Right hemiparesis (HCC) - Plan: MR BRAIN W WO CONTRAST, MR CERVICAL SPINE W WO CONTRAST     PLAN:  Multiple sclerosis disease modifying therapy (established problem, progressive; since ~2008; on tecfidera  in 2016; on ocrevus  since 2018) - continue ocrevus  (Jan / July) - due to progressive  symptoms, will obtain repeat MR brain and c-spine - referral placed to home home PT/OT - will place orders for lift chair due to increased falls and currently requiring EMS assistance when this occurs and  foldable transport chair as she is nonambulatory - CBC (07/2024) and IgG,IgA and IgM (06/2024) stable  - continue multivitamin and vitamin D  supplements  Muscle spasms (established problem, progressive) - continue baclofen  20mg  three times per day for muscle spasms - continue tizanidine  4mg  three times per day for muscle spasms  MEMORY LOSS - optimize nutrition and exercise - follow up with psychiatry (optimize depression / anxiety treatments; consider caffeine vs stimulant meds) - safety / supervision issues reviewed - no driving, caution with meds, finances  Bilateral leg pain / neuropathy (established problem, stable) - tried and failed gabapentin  - continue topiramate  50mg  daily  Bladder incontinence (established problem, progressive) - Increase oxybutynin  to 20 mg daily, discussed potential side effects - consider follow up with urology (tried and failed oxybutynin )  Depression / anxiety (established problem, stable) - per Dr. [facility]) - continue abilify , duloxetine   Orders Placed This Encounter  Procedures   MR BRAIN W WO CONTRAST   MR CERVICAL SPINE W WO CONTRAST   Ambulatory referral to Home Health   Meds ordered this encounter  Medications   oxybutynin  (DITROPAN -XL) 10 MG 24 hr tablet    Sig: Take 2 tablets (20 mg total) by mouth daily.    Dispense:  180 tablet    Refill:  3   Return in about 6 months (around 03/13/2025).     I personally spent a total of 45 minutes in the care of the patient today including preparing to see the patient, getting/reviewing separately obtained history, performing a medically appropriate exam/evaluation, counseling and educating, placing orders, and documenting clinical information in the EHR.  Harlene Bogaert,  AGNP-BC  [facility] [address] [place], [place] [address]  Phone [phone] Fax [phone] Note: This document was prepared with digital dictation and possible smart phrase technology. Any transcriptional errors that result from this process are unintentional.

## 2024-09-13 ENCOUNTER — Telehealth: Payer: Self-pay | Admitting: Adult Health

## 2024-09-13 ENCOUNTER — Encounter: Payer: Self-pay | Admitting: Physician Assistant

## 2024-09-13 NOTE — Telephone Encounter (Signed)
 [facility] is going to take this patient.

## 2024-09-13 NOTE — Telephone Encounter (Signed)
 Faxed to [facility]:  [phone] fx, [phone]  15pgs, (order, OV note). Received confirmation.

## 2024-09-14 ENCOUNTER — Encounter: Payer: Self-pay | Admitting: *Deleted

## 2024-09-14 ENCOUNTER — Telehealth: Payer: Self-pay | Admitting: Adult Health

## 2024-09-14 ENCOUNTER — Telehealth: Payer: Self-pay | Admitting: *Deleted

## 2024-09-14 NOTE — Telephone Encounter (Signed)
 Order and message to DME and pt for DME lift and mobility devices.

## 2024-09-14 NOTE — Telephone Encounter (Signed)
 MR brain w/wo shara: [id number] exp. 09/14/24-03/13/25 MR cervical w/wo auth: [id number] exp. 09/14/24-03/13/25 Scheduled at [facility] on 09/28/24

## 2024-09-14 NOTE — Progress Notes (Signed)
 I faxed to [employer] [phone] Ebony Warren [phone] for the DME supplies order that pt requested along with (F2F, hgt/wgt).  15 pgs.  Received confirmation fax back.

## 2024-09-18 ENCOUNTER — Encounter: Payer: Self-pay | Admitting: Medical Oncology

## 2024-09-19 ENCOUNTER — Telehealth: Payer: Self-pay | Admitting: Adult Health

## 2024-09-19 ENCOUNTER — Other Ambulatory Visit (INDEPENDENT_AMBULATORY_CARE_PROVIDER_SITE_OTHER): Payer: Self-pay

## 2024-09-19 NOTE — Telephone Encounter (Signed)
 Will, OT w/ [facility] has called for verbal orders for OT 2 week 6 his # with secure vm is [phone]

## 2024-09-20 ENCOUNTER — Encounter

## 2024-09-20 ENCOUNTER — Telehealth: Payer: Self-pay | Admitting: Adult Health

## 2024-09-20 NOTE — Telephone Encounter (Signed)
 Verbal ok for pt relating to OT with [facility]. 2 x week for 6 wks. Relayed to Will.

## 2024-09-20 NOTE — Telephone Encounter (Signed)
 Ok'd for PT with [facility] spoke to [place].

## 2024-09-20 NOTE — Telephone Encounter (Signed)
 Chareese, PT w/ [facility] has called for verbal orders for PT 1 week 1, 2 week 8 and 1 week 1, her # with secure vm is [phone]

## 2024-09-21 ENCOUNTER — Ambulatory Visit (HOSPITAL_COMMUNITY): Admission: RE | Admit: 2024-09-21 | Source: Ambulatory Visit

## 2024-09-22 ENCOUNTER — Encounter: Payer: Self-pay | Admitting: Physician Assistant

## 2024-09-23 ENCOUNTER — Other Ambulatory Visit (HOSPITAL_COMMUNITY): Payer: Self-pay | Admitting: Gastroenterology

## 2024-09-23 ENCOUNTER — Ambulatory Visit (HOSPITAL_COMMUNITY)
Admission: RE | Admit: 2024-09-23 | Discharge: 2024-09-23 | Disposition: A | Discharge status: Home / Self Care | Source: Ambulatory Visit | Attending: Gastroenterology | Admitting: Gastroenterology

## 2024-09-23 ENCOUNTER — Encounter (HOSPITAL_COMMUNITY): Payer: Self-pay

## 2024-09-23 DIAGNOSIS — R52 Pain, unspecified: Secondary | ICD-10-CM

## 2024-09-23 DIAGNOSIS — K862 Cyst of pancreas: Secondary | ICD-10-CM

## 2024-09-23 NOTE — Progress Notes (Signed)
 The IV nurse rec'd a consult for an IV start in MRI;  attempted x 3, also using ultrasound and heat packs; pt very cold and has been NPO.  No further attempts made; Staff aware.

## 2024-09-26 ENCOUNTER — Ambulatory Visit (INDEPENDENT_AMBULATORY_CARE_PROVIDER_SITE_OTHER): Admitting: Internal Medicine

## 2024-09-26 VITALS — BP 128/84 | HR 68 | Ht 68.0 in | Wt 215.0 lb

## 2024-09-26 DIAGNOSIS — G40909 Epilepsy, unspecified, not intractable, without status epilepticus: Secondary | ICD-10-CM | POA: Diagnosis not present

## 2024-09-26 DIAGNOSIS — E88819 Insulin resistance, unspecified: Secondary | ICD-10-CM

## 2024-09-26 DIAGNOSIS — E878 Other disorders of electrolyte and fluid balance, not elsewhere classified: Secondary | ICD-10-CM | POA: Diagnosis not present

## 2024-09-26 DIAGNOSIS — Z6834 Body mass index (BMI) 34.0-34.9, adult: Secondary | ICD-10-CM | POA: Diagnosis not present

## 2024-09-26 DIAGNOSIS — E66811 Obesity, class 1: Secondary | ICD-10-CM | POA: Diagnosis not present

## 2024-09-26 MED ORDER — METFORMIN HCL ER 500 MG PO TB24: 500.0000 mg | ORAL_TABLET | Freq: Two times a day (BID) | ORAL | 0 refills | Status: AC

## 2024-09-26 NOTE — Assessment & Plan Note (Signed)
 Weight: decrease of 96.7 lb (31%) over 1 year, 11 months  Start: 10/27/2022 311 lb 11.2 oz (141.4 kg) (H)  End: 09/26/2024 215 lb (97.5 kg)  Continue current weight management strategy.  Continue metformin  For insulin  resistance and weight management.

## 2024-09-26 NOTE — Assessment & Plan Note (Signed)
 We had reduced the topiramate  to once a day, it seems like a week later she had a breakthrough seizure.  We will discuss with neurology.  Recommend she restart topiramate  twice a day.  We will check labs for metabolic acidosis in 2 to 3 weeks.

## 2024-09-26 NOTE — Assessment & Plan Note (Signed)
 It was not known that patient had a seizure disorder.  Her topiramate  has been reduced to 1 tablet daily due to the presence of decreased bicarbonate.  She has not had a recurrent seizures.  She has been on gabapentin  in the past which was contributing to fluid retention and weight gain.  The topiramate  has been helpful in her weight loss.  The recent breakthrough seizure was likely due to the reduction of medication.  I recommend that she topiramate  twice a day and we will check labs 2 to 3 weeks.

## 2024-09-26 NOTE — Assessment & Plan Note (Signed)
 There has been a significant reduction in insulin  levels also improvement in glycemic control.  She has lost approximately 31% of total body weight and is on metformin  for pharmacoprophylaxis.  She will continue medication.

## 2024-09-26 NOTE — Progress Notes (Unsigned)
 Office: [phone]  /  Fax: [phone]  Weight Summary and Body Composition Analysis (BIA)  Vitals BP: 128/84 Pulse Rate: 68 SpO2: 100 %   Anthropometric Measurements Height: 5' 8 (1.727 m) Weight: 215 lb (97.5 kg) BMI (Calculated): 32.7 Weight at Last Visit: 220lb Weight Lost Since Last Visit: 5lb Weight Gained Since Last Visit: 0lb Starting Weight: 309lb Total Weight Loss (lbs): 94 lb (42.6 kg) Peak Weight: 312lb   No data recorded  RMR: 1426  Today's Visit #: 21  Starting Date: 02/16/23   Subjective   Chief Complaint: Obesity  Interval History Discussed the use of AI scribe software for clinical note transcription with the patient, who gave verbal consent to proceed.  History of Present Illness Ebony Warren is a 48 year old female.  She presents for medical weight management.  Since last office visit she has lost 5 pounds. She has not been exercising.  She had some blood work completed which showed a decrease in CO2 consistent with possible metabolic acidosis.    She has a history of seizures and was taking topiramate  50 mg twice daily. Recently, her dosage was reduced to 50 mg once daily due to concerns about metabolic acidosis. Approximately one week after this dosage reduction, she experienced a seizure lasting about 5 to 10 minutes. Her mother described it as generalized jerking. She did not lose consciousness but had a severe headache afterward and went to sleep. No exposure to flashing lights prior to the seizure.  She reports having these type of seizures in the past.  She has lost five pounds recently and reports no increase in hunger, maintaining three meals a day. She is currently taking metformin  and may need a refill soon.  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 This has caused issues with blood draws, as evidenced by a recent failed  attempt to obtain an IV for an MRI, resulting in a significant bruise.  She does not drive and relies on transportation services.     Challenges affecting patient progress: orthopedic problems, medical conditions or chronic pain affecting mobility and medical comorbidities.    Pharmacotherapy for weight management: She is currently taking Metformin  (off label use for weight management and / or insulin  resistance and / or diabetes prevention) with adequate clinical response  and without side effects. and Topiramate  (off label use, single agent) with adequate clinical response  and without side effects..   Assessment and Plan   Treatment Plan For Obesity:  Recommended Dietary Goals  [name] is currently in the action stage of change. As such, her goal is to continue weight management plan. She has agreed to: continue current plan  Behavioral Health and Counseling  We discussed the following behavioral modification strategies today: continue to work on maintaining a reduced calorie state, getting the recommended amount of protein, incorporating whole foods, making healthy choices, staying well hydrated and practicing mindfulness when eating. and increase protein intake, fibrous foods (25 grams per day for women, 30 grams for men) and water to improve satiety and decrease hunger signals. .  Additional education and resources provided today: None  Recommended Physical Activity Goals  [name] has been advised to work up to 150 minutes of moderate intensity aerobic activity a week and strengthening exercises 2-3 times per week for cardiovascular health, weight loss maintenance and preservation of muscle mass.  She has agreed to :  Think about enjoyable ways to increase daily physical activity and overcoming barriers to exercise and Increase physical activity in their day and reduce sedentary time (increase NEAT).  Medical Interventions and Pharmacotherapy  We discussed various  medication options to help [facility] with her weight loss efforts and we both agreed to : Adequate clinical response to anti-obesity medication, continue current regimen  Associated Conditions Impacted by Obesity Treatment  Assessment & Plan Low bicarbonate We had reduced the topiramate  to once a day, it seems like a week later she had a breakthrough seizure.  We will discuss with neurology.  Recommend she restart topiramate  twice a day.  We will check labs for metabolic acidosis in 2 to 3 weeks. Seizure disorder [facility]) It was not known that patient had a seizure disorder.  Her topiramate  has been reduced to 1 tablet daily due to the presence of decreased bicarbonate.  She has not had a recurrent seizures.  She has been on gabapentin  in the past which was contributing to fluid retention and weight gain.  The topiramate  has been helpful in her weight loss.  The recent breakthrough seizure was likely due to the reduction of medication.  I recommend that she topiramate  twice a day and we will check labs 2 to 3 weeks.   Insulin  resistance There has been a significant reduction in insulin  levels also improvement in glycemic control.  She has lost approximately 31% of total body weight and is on metformin  for pharmacoprophylaxis.  She will continue medication. Class 1 obesity with serious comorbidity and body mass index (BMI) of 34.0 to 34.9 in adult, unspecified obesity type Weight: decrease of 96.7 lb (31%) over 1 year, 11 months  Start: 10/27/2022 311 lb 11.2 oz (141.4 kg) (H)  End: 09/26/2024 215 lb (97.5 kg)  Continue current weight management strategy.   Continue metformin  For insulin  resistance and weight management.       Objective   Physical Exam:  Blood pressure 128/84, pulse 68, height 5' 8 (1.727 m), weight 215 lb (97.5 kg), SpO2 100%. Body mass index is 32.69 kg/m.  General: She is overweight, cooperative, alert, well developed, and in no acute distress. PSYCH: Has normal mood,  affect and thought process.   HEENT: EOMI, sclerae are anicteric. Lungs: Normal breathing effort, no conversational dyspnea. Extremities: No edema.  Neurologic: No gross sensory or motor deficits. No tremors or fasciculations noted.    Diagnostic Data Reviewed:  BMET    Component Value Date/Time   NA 140 09/06/2024 0955   K 4.2 09/06/2024 0955   CL 107 (H) 09/06/2024 0955   CO2 18 (L) 09/06/2024 0955   GLUCOSE 79 09/06/2024 0955   GLUCOSE 82 05/19/2024 1455   BUN 13 09/06/2024 0955   CREATININE 0.82 09/06/2024 0955   CREATININE 0.70 05/19/2024 1455   CALCIUM 9.4 09/06/2024 0955   GFRNONAA >60 05/19/2024 1455   GFRAA 87 12/10/2020 1251   Lab Results  Component Value Date   HGBA1C 5.2 09/06/2024   HGBA1C 5.3 08/19/2022   Lab Results  Component Value Date   INSULIN  4.8 11/17/2023   INSULIN  25.0 (H) 02/23/2023   Lab Results  Component Value Date   TSH 1.690 02/23/2023   CBC    Component Value Date/Time   WBC 4.7 07/07/2024 0937   RBC 4.45 07/07/2024 0937   HGB 13.6 07/07/2024 0937   HGB 12.8 05/19/2024 1455   HGB 13.9 01/04/2024 1352   HCT 41.6 07/07/2024 0937   HCT 42.1 01/04/2024 1352  PLT 267.0 07/07/2024 0937   PLT 276 05/19/2024 1455   PLT 291 01/04/2024 1352   MCV 93.7 07/07/2024 0937   MCV 95 01/04/2024 1352   MCH 30.5 05/19/2024 1455   MCHC 32.6 07/07/2024 0937   RDW 14.3 07/07/2024 0937   RDW 13.0 01/04/2024 1352   Iron Studies    Component Value Date/Time   IRON 68 10/27/2022 1056   TIBC 313 10/27/2022 1056   FERRITIN 41 10/27/2022 1056   IRONPCTSAT 22 10/27/2022 1056   Lipid Panel     Component Value Date/Time   CHOL 163 09/06/2024 0955   TRIG 59 09/06/2024 0955   HDL 57 09/06/2024 0955   LDLCALC 94 09/06/2024 0955   Hepatic Function Panel     Component Value Date/Time   PROT 6.4 09/06/2024 0955   ALBUMIN 4.0 09/06/2024 0955   AST 9 09/06/2024 0955   AST 7 (L) 05/19/2024 1455   ALT 6 09/06/2024 0955   ALT 5 05/19/2024 1455    ALKPHOS 81 09/06/2024 0955   BILITOT 0.4 09/06/2024 0955   BILITOT 0.3 05/19/2024 1455      Component Value Date/Time   TSH 1.690 02/23/2023 1305   Nutritional Lab Results  Component Value Date   VD25OH 55.6 02/23/2023   VD25OH 38.1 09/23/2021   VD25OH 34.1 12/22/2016    Medications: Outpatient Encounter Medications as of 09/26/2024  Medication Sig Note   acetaminophen  (TYLENOL ) 160 MG/5ML elixir Take 20 mLs by mouth 2 (two) times daily as needed for fever.    apixaban  (ELIQUIS ) 5 MG TABS tablet Take 1 tablet (5 mg total) by mouth 2 (two) times daily. 08/16/2024: Last taken 08/14/24, both AM and PM doses taken   ARIPiprazole  (ABILIFY ) 5 MG tablet Take 5 mg by mouth daily.    baclofen  (LIORESAL ) 20 MG tablet Take 1 tablet (20 mg total) by mouth 3 (three) times daily.    cholecalciferol  (VITAMIN D ) 1000 UNITS tablet Take 1,000 Units by mouth daily.    diphenhydrAMINE  (BENADRYL ) 25 mg capsule Take 25 mg by mouth every 6 (six) hours as needed for allergies.    DULoxetine  (CYMBALTA ) 30 MG capsule Take 30 mg by mouth daily.    LORazepam  (ATIVAN ) 1 MG tablet Take 1 tablet (1 mg total) by mouth daily.    metFORMIN  (GLUCOPHAGE -XR) 500 MG 24 hr tablet Take 1 tablet (500 mg total) by mouth 2 (two) times daily with a meal.    Multiple Vitamins-Minerals (MULTIVITAMIN ADULT PO) Take 1 tablet by mouth daily.    nystatin  (MYCOSTATIN /NYSTOP ) powder Apply 1 Application topically 2 (two) times daily. (Patient taking differently: Apply 1 Application topically 2 (two) times daily as needed (skin irritation).)    ocrelizumab  (OCREVUS ) 300 MG/10ML injection Infuse 600 mg IV Every 6 months    oxybutynin  (DITROPAN -XL) 10 MG 24 hr tablet Take 2 tablets (20 mg total) by mouth daily.    pantoprazole  (PROTONIX ) 40 MG tablet Take 1 tablet (40 mg total) by mouth daily. (Patient taking differently: Take 40 mg by mouth daily as needed (heartburn).)    polyethylene glycol powder (GLYCOLAX /MIRALAX ) 17 GM/SCOOP powder  Take 17 g by mouth 2 (two) times daily as needed.    Potassium (POTASSIMIN PO) Take 1 tablet by mouth daily at 12 noon.    tiZANidine  (ZANAFLEX ) 4 MG tablet Take 1 tablet (4 mg total) by mouth 3 (three) times daily.    topiramate  (TOPAMAX ) 50 MG tablet Take 1 tablet (50 mg total) by mouth daily.    [  DISCONTINUED] metFORMIN  (GLUCOPHAGE -XR) 500 MG 24 hr tablet Take 1 tablet (500 mg total) by mouth 2 (two) times daily with a meal.    Facility-Administered Encounter Medications as of 09/26/2024  Medication   diphenhydrAMINE  (BENADRYL ) injection 50 mg   famotidine  (PEPCID ) 20 mg in sodium chloride  0.9 % 50 mL IVPB   methylPREDNISolone  sodium succinate (SOLU-MEDROL ) 125 mg in sodium chloride  0.9 % 50 mL IVPB   ocrelizumab  (OCREVUS ) 600 mg in sodium chloride  0.9 % 250 mL     Follow-Up   Return in about 4 weeks (around 10/24/2024) for For Weight Mangement with Dr. Francyne.SABRA She was informed of the importance of frequent follow up visits to maximize her success with intensive lifestyle modifications for her multiple health conditions.  Attestation Statement   Reviewed by clinician on day of visit: allergies, medications, problem list, medical history, surgical history, family history, social history, and previous encounter notes.     Lucas Francyne, MD

## 2024-09-27 ENCOUNTER — Encounter (INDEPENDENT_AMBULATORY_CARE_PROVIDER_SITE_OTHER): Payer: Self-pay | Admitting: Internal Medicine

## 2024-09-28 ENCOUNTER — Ambulatory Visit (HOSPITAL_COMMUNITY): Admission: RE | Admit: 2024-09-28 | Source: Ambulatory Visit

## 2024-09-29 ENCOUNTER — Ambulatory Visit (HOSPITAL_COMMUNITY)
Admission: RE | Admit: 2024-09-29 | Discharge: 2024-09-29 | Disposition: A | Discharge status: Home / Self Care | Source: Ambulatory Visit | Attending: Adult Health | Admitting: Adult Health

## 2024-09-29 DIAGNOSIS — R252 Cramp and spasm: Secondary | ICD-10-CM | POA: Diagnosis not present

## 2024-09-29 DIAGNOSIS — G35D Multiple sclerosis, unspecified: Secondary | ICD-10-CM | POA: Insufficient documentation

## 2024-09-29 DIAGNOSIS — G8191 Hemiplegia, unspecified affecting right dominant side: Secondary | ICD-10-CM | POA: Insufficient documentation

## 2024-09-29 DIAGNOSIS — G9389 Other specified disorders of brain: Secondary | ICD-10-CM | POA: Diagnosis not present

## 2024-09-29 DIAGNOSIS — N39498 Other specified urinary incontinence: Secondary | ICD-10-CM

## 2024-09-29 DIAGNOSIS — M5020 Other cervical disc displacement, unspecified cervical region: Secondary | ICD-10-CM | POA: Diagnosis not present

## 2024-09-29 DIAGNOSIS — G379 Demyelinating disease of central nervous system, unspecified: Secondary | ICD-10-CM | POA: Diagnosis not present

## 2024-09-29 DIAGNOSIS — R29898 Other symptoms and signs involving the musculoskeletal system: Secondary | ICD-10-CM | POA: Diagnosis not present

## 2024-09-29 DIAGNOSIS — M4802 Spinal stenosis, cervical region: Secondary | ICD-10-CM | POA: Diagnosis not present

## 2024-09-29 MED ORDER — GADOBUTROL 1 MMOL/ML IV SOLN
9.0000 mL | Freq: Once | INTRAVENOUS | Status: AC | PRN
  Administered 2024-09-29: 9 mL via INTRAVENOUS

## 2024-09-30 ENCOUNTER — Other Ambulatory Visit: Payer: Self-pay | Admitting: Physician Assistant

## 2024-09-30 DIAGNOSIS — K8689 Other specified diseases of pancreas: Secondary | ICD-10-CM

## 2024-09-30 DIAGNOSIS — I2694 Multiple subsegmental pulmonary emboli without acute cor pulmonale: Secondary | ICD-10-CM

## 2024-10-02 ENCOUNTER — Encounter: Payer: Self-pay | Admitting: Physician Assistant

## 2024-10-04 ENCOUNTER — Ambulatory Visit (HOSPITAL_COMMUNITY)
Admission: RE | Admit: 2024-10-04 | Discharge: 2024-10-04 | Disposition: A | Discharge status: Home / Self Care | Source: Ambulatory Visit | Attending: Gastroenterology | Admitting: Gastroenterology

## 2024-10-04 ENCOUNTER — Ambulatory Visit: Payer: Self-pay | Admitting: Adult Health

## 2024-10-04 DIAGNOSIS — K862 Cyst of pancreas: Secondary | ICD-10-CM | POA: Diagnosis present

## 2024-10-04 DIAGNOSIS — R109 Unspecified abdominal pain: Secondary | ICD-10-CM | POA: Diagnosis not present

## 2024-10-04 DIAGNOSIS — Z9049 Acquired absence of other specified parts of digestive tract: Secondary | ICD-10-CM | POA: Diagnosis not present

## 2024-10-04 DIAGNOSIS — R52 Pain, unspecified: Secondary | ICD-10-CM | POA: Diagnosis not present

## 2024-10-04 MED ORDER — GADOBUTROL 1 MMOL/ML IV SOLN
10.0000 mL | Freq: Once | INTRAVENOUS | Status: AC | PRN
  Administered 2024-10-04: 10 mL via INTRAVENOUS

## 2024-10-05 ENCOUNTER — Telehealth: Payer: Self-pay | Admitting: Adult Health

## 2024-10-05 DIAGNOSIS — F330 Major depressive disorder, recurrent, mild: Secondary | ICD-10-CM | POA: Diagnosis not present

## 2024-10-05 DIAGNOSIS — F411 Generalized anxiety disorder: Secondary | ICD-10-CM | POA: Diagnosis not present

## 2024-10-05 NOTE — Telephone Encounter (Signed)
 She c/o RLE weakness and greater difficulty moving leg at recent visit. Is the numbness/tingling and swelling new or something that has been present but worsened this morning? Any redness or warmth in back of leg? Will also forward to Dr. Margaret for further input.

## 2024-10-05 NOTE — Telephone Encounter (Signed)
 I called pt back. She said that she did hav ethe numbness tingling/ swelling at the visit.  Her leg is not warm, red or painful other then numbness/tingling.

## 2024-10-05 NOTE — Telephone Encounter (Signed)
 I called pt and she states that she has R leg weakness,swelling, numbness, tingling  that started this am.  Also headache , taking tylenol  helps   no fever.  Her vision and incontinence same. No other changes.   PT there did not do therapy, thought best to rest. Her MS drug OCREVUS . Her recent MRI brain/cervical below:   Your recent cervical and brain imaging did not show any progression of MS compared to prior imaging which is great news! Please continue current treatment plan.   Harlene, NP Written by Harlene Bogaert, NP on 10/04/2024 11:09 AM EDT (pt aware).  I told her will send message to Harlene, NP and get back with her.

## 2024-10-05 NOTE — Telephone Encounter (Signed)
 PT Ebony Warren w/ [facility] has called to report pt has a heart rate of 108, he believes pt is having an exacerbation. On right leg and foot pt has swelling. MRI done yesterday, right side more difficult to move than usual.  He does not think pt needs to go to ED pt thinks she just needs rest.  He is ok with message being sent as a high priority.

## 2024-10-06 ENCOUNTER — Ambulatory Visit: Payer: Self-pay | Admitting: Gastroenterology

## 2024-10-13 ENCOUNTER — Other Ambulatory Visit: Payer: Self-pay | Admitting: Diagnostic Neuroimaging

## 2024-10-13 ENCOUNTER — Encounter: Payer: Self-pay | Admitting: Physician Assistant

## 2024-10-16 DIAGNOSIS — G35B0 Primary progressive multiple sclerosis, unspecified: Secondary | ICD-10-CM | POA: Diagnosis not present

## 2024-10-17 ENCOUNTER — Encounter: Payer: Self-pay | Admitting: Student

## 2024-10-17 ENCOUNTER — Ambulatory Visit (INDEPENDENT_AMBULATORY_CARE_PROVIDER_SITE_OTHER): Admitting: Student

## 2024-10-17 VITALS — BP 123/81 | HR 77 | Temp 98.0°F | Ht 68.0 in | Wt 219.6 lb

## 2024-10-17 DIAGNOSIS — R29898 Other symptoms and signs involving the musculoskeletal system: Secondary | ICD-10-CM

## 2024-10-17 NOTE — Patient Instructions (Addendum)
 Thank you, [name] for allowing us  to provide your care today. Today we discussed:  -MRI of your back (thoracic and lumbar spine) to check on the right sided back weakness. -Continue working with physical therapy, ask about exercises to help with your back weakness -Check with your neurologist office about scheduling earlier appointment to discuss this as well. Check on your Ocrevus  injection.    Follow up: 1-2 months with Dr. Shawn    Should you have any questions or concerns please call the internal medicine clinic at [phone].    Ebony Warren, D.O. [facility]

## 2024-10-17 NOTE — Progress Notes (Addendum)
 CC: Acute walk in visit  HPI: [name] is a 48 y.o. female living with a history stated below and presents today for acute visit. Please see problem based assessment and plan for additional details.  Past Medical History:  Diagnosis Date   ADD (attention deficit disorder)    Anxiety and depression    Arthritis    Bradycardia    Constipation    Edema of both lower extremities    Encounter to establish care 01/22/2021   Family history of adverse reaction to anesthesia    nausea   Food allergy    Gallbladder problem    GERD (gastroesophageal reflux disease)    Headache    Hypertension    Multiple sclerosis    Obesity    Osteoarthritis    Pulmonary emboli (HCC)    Screening mammogram, encounter for 01/22/2021   Seizures (HCC)    told that they are not from the brain but stress related   Swelling of lower extremity 03/24/2023   Vitamin D  deficiency     Current Outpatient Medications on File Prior to Visit  Medication Sig Dispense Refill   acetaminophen  (TYLENOL ) 160 MG/5ML elixir Take 20 mLs by mouth 2 (two) times daily as needed for fever.     ARIPiprazole  (ABILIFY ) 5 MG tablet Take 5 mg by mouth daily.     baclofen  (LIORESAL ) 20 MG tablet TAKE 1 TABLET 3 TIMES A DAY 270 tablet 3   cholecalciferol  (VITAMIN D ) 1000 UNITS tablet Take 1,000 Units by mouth daily.     diphenhydrAMINE  (BENADRYL ) 25 mg capsule Take 25 mg by mouth every 6 (six) hours as needed for allergies.     DULoxetine  (CYMBALTA ) 30 MG capsule Take 30 mg by mouth daily.     ELIQUIS  5 MG TABS tablet TAKE 1 TABLET BY MOUTH TWICE A DAY 60 tablet 2   LORazepam  (ATIVAN ) 1 MG tablet Take 1 tablet (1 mg total) by mouth daily. 14 tablet 0   metFORMIN  (GLUCOPHAGE -XR) 500 MG 24 hr tablet Take 1 tablet (500 mg total) by mouth 2 (two) times daily with a meal. 180 tablet 0   Multiple Vitamins-Minerals (MULTIVITAMIN ADULT PO) Take 1 tablet by mouth daily.     nystatin  (MYCOSTATIN /NYSTOP ) powder Apply 1  Application topically 2 (two) times daily. (Patient taking differently: Apply 1 Application topically 2 (two) times daily as needed (skin irritation).) 60 g 1   ocrelizumab  (OCREVUS ) 300 MG/10ML injection Infuse 600 mg IV Every 6 months 20 mL 1   oxybutynin  (DITROPAN -XL) 10 MG 24 hr tablet Take 2 tablets (20 mg total) by mouth daily. 180 tablet 3   pantoprazole  (PROTONIX ) 40 MG tablet Take 1 tablet (40 mg total) by mouth daily. (Patient taking differently: Take 40 mg by mouth daily as needed (heartburn).) 90 tablet 3   polyethylene glycol powder (GLYCOLAX /MIRALAX ) 17 GM/SCOOP powder Take 17 g by mouth 2 (two) times daily as needed. 765 g 1   Potassium (POTASSIMIN PO) Take 1 tablet by mouth daily at 12 noon.     tiZANidine  (ZANAFLEX ) 4 MG tablet Take 1 tablet (4 mg total) by mouth 3 (three) times daily. 270 tablet 4   topiramate  (TOPAMAX ) 50 MG tablet TAKE 1 TABLET TWICE A DAY 180 tablet 3   Current Facility-Administered Medications on File Prior to Visit  Medication Dose Route Frequency Provider Last Rate Last Admin   diphenhydrAMINE  (BENADRYL ) injection 50 mg  50 mg Intravenous Once Penumalli, Vikram R, MD  famotidine  (PEPCID ) 20 mg in sodium chloride  0.9 % 50 mL IVPB  20 mg Intravenous Once Penumalli, Vikram R, MD       methylPREDNISolone  sodium succinate (SOLU-MEDROL ) 125 mg in sodium chloride  0.9 % 50 mL IVPB  125 mg Intravenous Once Penumalli, Vikram R, MD       ocrelizumab  (OCREVUS ) 600 mg in sodium chloride  0.9 % 250 mL  600 mg Intravenous Once Penumalli, Eduard SAUNDERS, MD        Family History  Problem Relation Age of Onset   Hypertension Mother    Atrial fibrillation Mother    Thyroid  disease Mother    Endometrial cancer Mother    Obesity Mother    Colon polyps Mother    Hypertension Father    Atrial fibrillation Maternal Grandmother    Lung cancer Maternal Grandfather    Colon cancer Neg Hx    Esophageal cancer Neg Hx    Inflammatory bowel disease Neg Hx    Liver disease Neg  Hx    Pancreatic cancer Neg Hx    Rectal cancer Neg Hx    Stomach cancer Neg Hx     Social History   Socioeconomic History   Marital status: Divorced    Spouse name: Not on file   Number of children: 1   Years of education: College   Highest education level: Not on file  Occupational History    Employer: OTHER    Comment: disabled  Tobacco Use   Smoking status: Former    Current packs/day: 0.00    Average packs/day: 0.4 packs/day for 3.0 years (1.1 ttl pk-yrs)    Types: Cigarettes    Start date: 12/08/2003    Quit date: 12/07/2006    Years since quitting: 17.8   Smokeless tobacco: Never  Vaping Use   Vaping status: Never Used  Substance and Sexual Activity   Alcohol use: No    Alcohol/week: 0.0 standard drinks of alcohol    Comment: quit: 2013 (socially)   Drug use: Not Currently    Types: Marijuana    Comment: Smokes approx 1-2 per day   Sexual activity: Not Currently  Other Topics Concern   Not on file  Social History Narrative   ** Merged History Encounter **       Patient lives at home with family. Caffeine Use: drinks a cup of hot tea with tecfidera  in the am Patient is right handed. Patient has a college education   Social Drivers of Corporate Investment Banker Strain: Low Risk  (06/07/2024)   Overall Financial Resource Strain (CARDIA)    Difficulty of Paying Living Expenses: Not hard at all  Food Insecurity: No Food Insecurity (08/16/2024)   Hunger Vital Sign    Worried About Running Out of Food in the Last Year: Never true    Ran Out of Food in the Last Year: Never true  Transportation Needs: No Transportation Needs (08/16/2024)   PRAPARE - Administrator, Civil Service (Medical): No    Lack of Transportation (Non-Medical): No  Physical Activity: Inactive (06/07/2024)   Exercise Vital Sign    Days of Exercise per Week: 0 days    Minutes of Exercise per Session: 0 min  Stress: No Stress Concern Present (06/07/2024)   [employer] of  Occupational Health - Occupational Stress Questionnaire    Feeling of Stress: Not at all  Social Connections: Socially Integrated (06/07/2024)   Social Connection and Isolation Panel    Frequency of Communication  with Friends and Family: More than three times a week    Frequency of Social Gatherings with Friends and Family: More than three times a week    Attends Religious Services: More than 4 times per year    Active Member of [employer] or Organizations: Yes    Attends Engineer, Structural: More than 4 times per year    Marital Status: Married  Catering Manager Violence: Not At Risk (08/16/2024)   Humiliation, Afraid, Rape, and Kick questionnaire    Fear of Current or Ex-Partner: No    Emotionally Abused: No    Physically Abused: No    Sexually Abused: No    Review of Systems: ROS negative except for what is noted on the assessment and plan.  Vitals:   10/17/24 0826  BP: 123/81  Pulse: 77  Temp: 98 F (36.7 C)  TempSrc: Oral  SpO2: 94%  Weight: 219 lb 9.6 oz (99.6 kg)  Height: 5' 8 (1.727 m)   Physical Exam: Constitutional: alert, sitting in wheelchair comfortably, in no acute distress Cardiovascular: regular rate Pulmonary/Chest: normal work of breathing on room air MSK: Spine w/o obvious deformity, no TTP, able to rotate upper and lower trunk  Neurological: alert & oriented x 3, grossly intact CN, no facial asymmetry or slurred speech, 1/5 RUE and RLE, 4+/5 of LUE and LLE, no acute changes in sensation, able to rotate trunk to left and right w/o issue  Skin: warm and dry  Assessment & Plan:   Assessment & Plan Weakness of back Reports hx of right sided mid/lower back weakness for at least 3 months. States no pain or tenderness. No falls,injuries or trauma. States when sitting up straight, feels like right side gives out and she leans to the right. No prior noticing this. No changes in her chronic right UE and LE weakness. No new weakness, numbness, tingling of her  extremities. No sensory changes. Endorses chronic urinary incontinence and constipation but no acute change. Saw neurology in October but did not discuss this with them even though symptoms started before OV and MRI brain and c-spine.   No changes in her muscle relaxers and no recent medication changes.   She is on Ocrevus  injections Q6 months, last one in June so due soon. States she knows when she is close to next injection due to increase weakness and/or falls.   MRI brain and c-spine (09/29/24) with stable demyelinating disease but no acute findings. MRCP (10/04/24) did not show bony lesions. These imaging studies were done after she noticed the right sided back weakness.   Query if this is progression of her MS, glad that recent MRI brain and c-spine w/o acute changes. Will obtain MRI of thoracic and lumber spine to further evaluate and encouraged pt to schedule earlier appt w/ neuro.   Plan -MRI w w/o of thoracic and lumbar spine.  -Continue to work w/ PT including for pt's back -Encourage earlier appt w/ neurology to f/u  Orders:   MR LUMBAR SPINE W WO CONTRAST; Future   MR THORACIC SPINE W WO CONTRAST; Future     Return in about 2 months (around 12/17/2024) for routine check up .   Patient discussed with Dr. Machen  Fumiye Lubben, D.O. [facility], PGY-3 Clinic Phone: [phone] Date 10/17/2024 Time 11:17 AM

## 2024-10-18 ENCOUNTER — Encounter

## 2024-10-18 NOTE — Progress Notes (Signed)
 Internal Medicine Clinic Attending  Case discussed with the resident at the time of the visit.  We reviewed the resident's history and exam and pertinent patient test results.  I agree with the assessment, diagnosis, and plan of care documented in the resident's note.

## 2024-10-20 ENCOUNTER — Ambulatory Visit (INDEPENDENT_AMBULATORY_CARE_PROVIDER_SITE_OTHER): Payer: Self-pay

## 2024-10-20 VITALS — BP 124/86 | HR 62 | Temp 97.8°F | Ht 68.0 in

## 2024-10-20 DIAGNOSIS — R300 Dysuria: Secondary | ICD-10-CM | POA: Diagnosis not present

## 2024-10-20 DIAGNOSIS — N390 Urinary tract infection, site not specified: Secondary | ICD-10-CM | POA: Insufficient documentation

## 2024-10-20 MED ORDER — NITROFURANTOIN MONOHYD MACRO 100 MG PO CAPS: 100.0000 mg | ORAL_CAPSULE | Freq: Two times a day (BID) | ORAL | 0 refills | Status: AC

## 2024-10-20 NOTE — Patient Instructions (Addendum)
 Today we discussed the following medical conditions and plan:   UTI  - I have sent in for Macrobid 100 mg that you will take 1 tablet every 12 hours (so twice a day) for 5 days - if you have any worsening symptoms like fevers, chills, blood in your urine, call the clinic/go to the ED  We look forward to seeing you next time. Please call our clinic at [phone] if you have any questions or concerns. The best time to call is Monday-Friday from 9am-4pm, but there is someone available 24/7. If you need medication refills, please notify your pharmacy one week in advance and they will send us  a request.   Thank you for trusting me with your care. Wishing you the best!   Sallyanne Primas, DO  [facility]

## 2024-10-20 NOTE — Progress Notes (Addendum)
 CC: Possible UTI  HPI:  [name] is a 48 y.o. female with medical history of MS, absence of bladder continence, hemiplegia of dominant side (further medical history stated below) and presents today for possible UTI. Please see problem based assessment and plan for additional details.  Last Pertinent Labs Documented:     Latest Ref Rng & Units 09/06/2024    9:55 AM 05/19/2024    2:55 PM 05/18/2024    4:36 AM  BMP  Glucose 70 - 99 mg/dL 79  82  84   BUN 6 - 24 mg/dL 13  15  17    Creatinine 0.57 - 1.00 mg/dL 9.17  9.29  9.28   BUN/Creat Ratio 9 - 23 16     Sodium 134 - 144 mmol/L 140  139  138   Potassium 3.5 - 5.2 mmol/L 4.2  3.6  4.1   Chloride 96 - 106 mmol/L 107  109  112   CO2 20 - 29 mmol/L 18  23  16    Calcium 8.7 - 10.2 mg/dL 9.4  9.0  8.6        Latest Ref Rng & Units 07/07/2024    9:37 AM 05/19/2024    2:55 PM 05/18/2024    4:36 AM  CBC  WBC 4.0 - 10.5 K/uL 4.7  6.3  5.4   Hemoglobin 12.0 - 15.0 g/dL 86.3  87.1  87.6   Hematocrit 36.0 - 46.0 % 41.6  39.1  39.1   Platelets 150.0 - 400.0 K/uL 267.0  276  231     Lab Results  Component Value Date   HGBA1C 5.2 09/06/2024   HGBA1C 5.2 11/26/2023   HGBA1C 5.3 08/19/2022     Past Medical History:  Diagnosis Date   ADD (attention deficit disorder)    Anxiety and depression    Arthritis    Bradycardia    Constipation    Edema of both lower extremities    Encounter to establish care 01/22/2021   Family history of adverse reaction to anesthesia    nausea   Food allergy    Gallbladder problem    GERD (gastroesophageal reflux disease)    Headache    Hypertension    Multiple sclerosis    Obesity    Osteoarthritis    Pulmonary emboli (HCC)    Screening mammogram, encounter for 01/22/2021   Seizures (HCC)    told that they are not from the brain but stress related   Swelling of lower extremity 03/24/2023   Vitamin D  deficiency     Current Outpatient Medications on File Prior to Visit   Medication Sig Dispense Refill   acetaminophen  (TYLENOL ) 160 MG/5ML elixir Take 20 mLs by mouth 2 (two) times daily as needed for fever.     ARIPiprazole  (ABILIFY ) 5 MG tablet Take 5 mg by mouth daily.     baclofen  (LIORESAL ) 20 MG tablet TAKE 1 TABLET 3 TIMES A DAY 270 tablet 3   cholecalciferol  (VITAMIN D ) 1000 UNITS tablet Take 1,000 Units by mouth daily.     diphenhydrAMINE  (BENADRYL ) 25 mg capsule Take 25 mg by mouth every 6 (six) hours as needed for allergies.     DULoxetine  (CYMBALTA ) 30 MG capsule Take 30 mg by mouth daily.     ELIQUIS  5 MG TABS tablet TAKE 1 TABLET BY MOUTH TWICE A DAY 60 tablet 2   LORazepam  (ATIVAN ) 1 MG tablet Take 1 tablet (1 mg total) by mouth daily. 14 tablet 0  metFORMIN  (GLUCOPHAGE -XR) 500 MG 24 hr tablet Take 1 tablet (500 mg total) by mouth 2 (two) times daily with a meal. 180 tablet 0   Multiple Vitamins-Minerals (MULTIVITAMIN ADULT PO) Take 1 tablet by mouth daily.     nystatin  (MYCOSTATIN /NYSTOP ) powder Apply 1 Application topically 2 (two) times daily. (Patient taking differently: Apply 1 Application topically 2 (two) times daily as needed (skin irritation).) 60 g 1   ocrelizumab  (OCREVUS ) 300 MG/10ML injection Infuse 600 mg IV Every 6 months 20 mL 1   oxybutynin  (DITROPAN -XL) 10 MG 24 hr tablet Take 2 tablets (20 mg total) by mouth daily. 180 tablet 3   pantoprazole  (PROTONIX ) 40 MG tablet Take 1 tablet (40 mg total) by mouth daily. (Patient taking differently: Take 40 mg by mouth daily as needed (heartburn).) 90 tablet 3   polyethylene glycol powder (GLYCOLAX /MIRALAX ) 17 GM/SCOOP powder Take 17 g by mouth 2 (two) times daily as needed. 765 g 1   Potassium (POTASSIMIN PO) Take 1 tablet by mouth daily at 12 noon.     tiZANidine  (ZANAFLEX ) 4 MG tablet Take 1 tablet (4 mg total) by mouth 3 (three) times daily. 270 tablet 4   topiramate  (TOPAMAX ) 50 MG tablet TAKE 1 TABLET TWICE A DAY 180 tablet 3   Current Facility-Administered Medications on File Prior to  Visit  Medication Dose Route Frequency Provider Last Rate Last Admin   diphenhydrAMINE  (BENADRYL ) injection 50 mg  50 mg Intravenous Once Penumalli, Vikram R, MD       famotidine  (PEPCID ) 20 mg in sodium chloride  0.9 % 50 mL IVPB  20 mg Intravenous Once Penumalli, Vikram R, MD       methylPREDNISolone  sodium succinate (SOLU-MEDROL ) 125 mg in sodium chloride  0.9 % 50 mL IVPB  125 mg Intravenous Once Penumalli, Vikram R, MD       ocrelizumab  (OCREVUS ) 600 mg in sodium chloride  0.9 % 250 mL  600 mg Intravenous Once Penumalli, Eduard SAUNDERS, MD        Family History  Problem Relation Age of Onset   Hypertension Mother    Atrial fibrillation Mother    Thyroid  disease Mother    Endometrial cancer Mother    Obesity Mother    Colon polyps Mother    Hypertension Father    Atrial fibrillation Maternal Grandmother    Lung cancer Maternal Grandfather    Colon cancer Neg Hx    Esophageal cancer Neg Hx    Inflammatory bowel disease Neg Hx    Liver disease Neg Hx    Pancreatic cancer Neg Hx    Rectal cancer Neg Hx    Stomach cancer Neg Hx     Social History   Socioeconomic History   Marital status: Divorced    Spouse name: Not on file   Number of children: 1   Years of education: College   Highest education level: Not on file  Occupational History    Employer: OTHER    Comment: disabled  Tobacco Use   Smoking status: Former    Current packs/day: 0.00    Average packs/day: 0.4 packs/day for 3.0 years (1.1 ttl pk-yrs)    Types: Cigarettes    Start date: 12/08/2003    Quit date: 12/07/2006    Years since quitting: 17.8   Smokeless tobacco: Never  Vaping Use   Vaping status: Never Used  Substance and Sexual Activity   Alcohol use: No    Alcohol/week: 0.0 standard drinks of alcohol    Comment: quit: 2013 (socially)  Drug use: Not Currently    Types: Marijuana    Comment: Smokes approx 1-2 per day   Sexual activity: Not Currently  Other Topics Concern   Not on file  Social History  Narrative   ** Merged History Encounter **       Patient lives at home with family. Caffeine Use: drinks a cup of hot tea with tecfidera  in the am Patient is right handed. Patient has a college education   Social Drivers of Corporate Investment Banker Strain: Low Risk  (06/07/2024)   Overall Financial Resource Strain (CARDIA)    Difficulty of Paying Living Expenses: Not hard at all  Food Insecurity: No Food Insecurity (08/16/2024)   Hunger Vital Sign    Worried About Running Out of Food in the Last Year: Never true    Ran Out of Food in the Last Year: Never true  Transportation Needs: No Transportation Needs (08/16/2024)   PRAPARE - Administrator, Civil Service (Medical): No    Lack of Transportation (Non-Medical): No  Physical Activity: Inactive (06/07/2024)   Exercise Vital Sign    Days of Exercise per Week: 0 days    Minutes of Exercise per Session: 0 min  Stress: No Stress Concern Present (06/07/2024)   [employer] of Occupational Health - Occupational Stress Questionnaire    Feeling of Stress: Not at all  Social Connections: Socially Integrated (06/07/2024)   Social Connection and Isolation Panel    Frequency of Communication with Friends and Family: More than three times a week    Frequency of Social Gatherings with Friends and Family: More than three times a week    Attends Religious Services: More than 4 times per year    Active Member of [employer] or Organizations: Yes    Attends Engineer, Structural: More than 4 times per year    Marital Status: Married  Catering Manager Violence: Not At Risk (08/16/2024)   Humiliation, Afraid, Rape, and Kick questionnaire    Fear of Current or Ex-Partner: No    Emotionally Abused: No    Physically Abused: No    Sexually Abused: No    Review of Systems: Review of Systems  Respiratory:  Negative for shortness of breath.   Cardiovascular:  Negative for chest pain.  Gastrointestinal:  Negative for nausea and  vomiting.  Genitourinary:  Positive for dysuria, frequency and urgency. Negative for hematuria.     Vitals:   10/20/24 0933  BP: 124/86  Pulse: 62  Temp: 97.8 F (36.6 C)  TempSrc: Oral  Height: 5' 8 (1.727 m)    Physical Exam: Physical Exam Exam conducted with a chaperone present.  Constitutional:      Appearance: She is obese.     Comments: Laying on exam table  Pulmonary:     Effort: Pulmonary effort is normal.  Abdominal:     General: Bowel sounds are normal.     Palpations: Abdomen is soft.     Tenderness: There is abdominal tenderness (suprapubic).  Genitourinary:    General: Normal vulva.     Pubic Area: No rash.      Labia:        Right: No rash, lesion or injury.        Left: No rash, lesion or injury.   Skin:    General: Skin is warm and dry.  Neurological:     Mental Status: She is alert.      Assessment & Plan:   Patient  seen with Dr. Karna Assessment & Plan Dysuria Presents to clinic with 1 week of pain/burning with urination and urinary leakage. Wears diapers at baseline. Patient's mother helps take care of her at home. Last UTI 2017 treated with Abx and symptoms resolved. Denies: hematuria, abdominal pain, back pain (seen in clinic last for back weakness), fevers, chills, vaginal discharge, rash. Reports NKDA. During appointment, patient had a bowel movement and urinated in incontinence brief. Nursing staff helped clear her up but patient was unable to provide another urine sample. Straight cath was unfortunately unavailable at clinic site. With patient on exam table, consent was given to examine external genitalia to assess for any new lesions, rash, or hygiene concerns that could be contributing to symptoms (exam WNL). Patient had suprapubic tenderness on exam. Based on symptom presentation, ultimately decided to treat for UTI. Risks and benefits of treating without urine sample was discussed with patient. Return precautions were discussed such as if  symptoms do not resolve or patient were to develop fevers/chills.  - Nitrofurantoin 100 mg BID for 5 days ordered   No orders of the defined types were placed in this encounter.    Sallyanne Primas, D.O. [facility], PGY-1 Date 10/20/2024 Time 2:15 PM

## 2024-10-23 DIAGNOSIS — E878 Other disorders of electrolyte and fluid balance, not elsewhere classified: Secondary | ICD-10-CM | POA: Diagnosis not present

## 2024-10-23 NOTE — Progress Notes (Signed)
 Internal Medicine Clinic Attending  I was physically present during the key portions of the resident provided service and participated in the medical decision making of patient's management care. I reviewed pertinent patient test results.  The assessment, diagnosis, and plan were formulated together and I agree with the documentation in the resident's note.  Empirically treated for UTI given consistent symptoms and inability to collect urine specimen. Return precautions were discussed for worsening or non-improving symptoms.  Karna Fellows, MD

## 2024-10-24 ENCOUNTER — Ambulatory Visit (INDEPENDENT_AMBULATORY_CARE_PROVIDER_SITE_OTHER): Payer: Self-pay | Admitting: Internal Medicine

## 2024-10-24 ENCOUNTER — Encounter (INDEPENDENT_AMBULATORY_CARE_PROVIDER_SITE_OTHER): Payer: Self-pay | Admitting: Internal Medicine

## 2024-10-24 ENCOUNTER — Encounter: Payer: Self-pay | Admitting: Medical Oncology

## 2024-10-24 VITALS — BP 131/82 | HR 77 | Temp 98.3°F | Ht 68.0 in | Wt 219.0 lb

## 2024-10-24 DIAGNOSIS — Z6834 Body mass index (BMI) 34.0-34.9, adult: Secondary | ICD-10-CM | POA: Diagnosis not present

## 2024-10-24 DIAGNOSIS — G40909 Epilepsy, unspecified, not intractable, without status epilepticus: Secondary | ICD-10-CM | POA: Diagnosis not present

## 2024-10-24 DIAGNOSIS — E878 Other disorders of electrolyte and fluid balance, not elsewhere classified: Secondary | ICD-10-CM

## 2024-10-24 DIAGNOSIS — R7401 Elevation of levels of liver transaminase levels: Secondary | ICD-10-CM

## 2024-10-24 DIAGNOSIS — E66811 Obesity, class 1: Secondary | ICD-10-CM | POA: Diagnosis not present

## 2024-10-24 DIAGNOSIS — E88819 Insulin resistance, unspecified: Secondary | ICD-10-CM

## 2024-10-24 NOTE — Assessment & Plan Note (Signed)
 Stable.  She is now on topiramate  50 mg twice a day.  Follow-up bicarbonate levels are normal awaiting lactate results.  Continue AED

## 2024-10-24 NOTE — Assessment & Plan Note (Signed)
 Improved.  She has lost approximately 30% of total body weight.  Continue metformin  along with nutritional strategies.

## 2024-10-24 NOTE — Assessment & Plan Note (Signed)
 Her ALT is again mildly elevated she had elevations in the past.  This may be medication related but she is high risk for MASLD.  I reviewed old records and she had negative hepatitis serologies in the past.  She denies any risk factors.  She also does not drink alcohol.  She recently had an MRI of her abdomen which did not show the presence of hepatic steatosis.  Recommend ongoing monitoring for now.  She has lost 30% of total body weight which tends to improve fatty liver.

## 2024-10-24 NOTE — Assessment & Plan Note (Signed)
 Weight: decrease of 92.7 lb (29.7%) over 2 years  Start: 10/27/2022 311 lb 11.2 oz (141.4 kg) (H)  End: 10/24/2024 219 lb (99.3 kg)  Obesity and medical weight management Weight gain of four pounds attributed to decreased physical activity due to MS exacerbation and inability to track calories due to a broken phone. No significant water retention or swelling. Possible contribution from new transfer chair with additional weight. - Continue current weight management plan with topiramate  and metformin . - Monitor weight and physical activity levels.

## 2024-10-24 NOTE — Progress Notes (Signed)
 Office: [phone]  /  Fax: [phone]  Weight Summary and Body Composition Analysis (BIA)  Vitals Temp: 98.3 F (36.8 C) BP: 131/82 Pulse Rate: 77 SpO2: 100 %   Anthropometric Measurements Height: 5' 8 (1.727 m) Weight: 219 lb (99.3 kg) BMI (Calculated): 33.31 Weight at Last Visit: 215 lb Weight Lost Since Last Visit: 0 lb Weight Gained Since Last Visit: 4 lb Starting Weight: 309 lb Total Weight Loss (lbs): 90 lb (40.8 kg) Peak Weight: 312 lb   No data recorded  RMR: 1426  Today's Visit #: 22  Starting Date: 02/16/23   Subjective   Chief Complaint: Obesity  Interval History Discussed the use of AI scribe software for clinical note transcription with the patient, who gave verbal consent to proceed.  History of Present Illness Ebony Warren is a 48 year old female with multiple sclerosis who presents for medical weight management.  Since last office visit she has gained 4 pounds but there was a recent changes to her wheelchair to may be due to diet.  She is following a 1200-calorie nutrition plan 40% of the time.  She is doing physical therapy and has not been going to the gym so she notes decreases in physical activity.  She is currently taking topiramate  twice daily for weight management, which has led to a decrease in appetite. Previously, she was on gabapentin  but switched due to concerns about weight gain.  She has experienced a recent weight gain of four pounds. Her multiple sclerosis is currently flaring, preventing her from exercising, and she is receiving in-home occupational and physical therapy. Additionally, her broken phone has disrupted her ability to use an app for calorie tracking.  No new symptoms are reported. She does not feel she is retaining water, as her legs are not more swollen than usual. She mentions a new cushion with seatbelts for her chair, which may have contributed to the weight change.  Recent lab work showed  normal bicarbonate levels after restarting topiramate  and a mild elevation in liver enzymes. Previous tests for hepatitis B and C were negative, and an MRI of the abdomen showed a normal liver. Her blood sugar was 83, and lactate levels are pending.     Challenges affecting patient progress: Multiple sclerosis activity, phone not working so unable to track and journal.    Pharmacotherapy for weight management: She is currently taking Metformin  (off label use for weight management and / or insulin  resistance and / or diabetes prevention) with adequate clinical response  and without side effects. and Topiramate  (off label use, single agent) with adequate clinical response  and without side effects..   Assessment and Plan   Treatment Plan For Obesity:  Recommended Dietary Goals  [name] is currently in the action stage of change. As such, her goal is to continue weight management plan. She has agreed to: continue current plan  Behavioral Health and Counseling  We discussed the following behavioral modification strategies today: continue to work on maintaining a reduced calorie state, getting the recommended amount of protein, incorporating whole foods, making healthy choices, staying well hydrated and practicing mindfulness when eating. and increase protein intake, fibrous foods (25 grams per day for women, 30 grams for men) and water to improve satiety and decrease hunger signals. .  Additional education and resources provided today: Handout on traveling and holiday eating strategies  Recommended Physical Activity Goals  Emi has been advised to work up to 150 minutes of moderate intensity aerobic activity a week  and strengthening exercises 2-3 times per week for cardiovascular health, weight loss maintenance and preservation of muscle mass.  She has agreed to :  She is currently having a flare of her MS has been receiving occupational therapy at home but has not been able to go to  the gym  Medical Interventions and Pharmacotherapy  We discussed various medication options to help Meriel with her weight loss efforts and we both agreed to : Continue topiramate  50 mg twice daily for neurological symptoms of MS and weight management.  Continue metformin   Associated Conditions Impacted by Obesity Treatment  Assessment & Plan Low bicarbonate Seizure disorder (HCC) Stable.  She is now on topiramate  50 mg twice a day.  Follow-up bicarbonate levels are normal awaiting lactate results.  Continue AED Class 1 obesity with serious comorbidity and body mass index (BMI) of 34.0 to 34.9 in adult, unspecified obesity type Weight: decrease of 92.7 lb (29.7%) over 2 years  Start: 10/27/2022 311 lb 11.2 oz (141.4 kg) (H)  End: 10/24/2024 219 lb (99.3 kg)  Obesity and medical weight management Weight gain of four pounds attributed to decreased physical activity due to MS exacerbation and inability to track calories due to a broken phone. No significant water retention or swelling. Possible contribution from new transfer chair with additional weight. - Continue current weight management plan with topiramate  and metformin . - Monitor weight and physical activity levels.  Insulin  resistance Improved.  She has lost approximately 30% of total body weight.  Continue metformin  along with nutritional strategies. Elevated ALT measurement Her ALT is again mildly elevated she had elevations in the past.  This may be medication related but she is high risk for MASLD.  I reviewed old records and she had negative hepatitis serologies in the past.  She denies any risk factors.  She also does not drink alcohol.  She recently had an MRI of her abdomen which did not show the presence of hepatic steatosis.  Recommend ongoing monitoring for now.  She has lost 30% of total body weight which tends to improve fatty liver.         Objective   Physical Exam:  Blood pressure 131/82, pulse 77,  temperature 98.3 F (36.8 C), height 5' 8 (1.727 m), weight 219 lb (99.3 kg), SpO2 100%. Body mass index is 33.3 kg/m.  General: She is overweight, cooperative, alert, well developed, and in no acute distress. PSYCH: Has normal mood, affect and thought process.   HEENT: EOMI, sclerae are anicteric. Lungs: Normal breathing effort, no conversational dyspnea. Extremities: No edema.  Neurologic: No gross sensory or motor deficits. No tremors or fasciculations noted.    Diagnostic Data Reviewed:  BMET    Component Value Date/Time   NA 139 10/23/2024 1340   K 3.8 10/23/2024 1340   CL 105 10/23/2024 1340   CO2 21 10/23/2024 1340   GLUCOSE 83 10/23/2024 1340   GLUCOSE 82 05/19/2024 1455   BUN 10 10/23/2024 1340   CREATININE 0.70 10/23/2024 1340   CREATININE 0.70 05/19/2024 1455   CALCIUM 8.9 10/23/2024 1340   GFRNONAA >60 05/19/2024 1455   GFRAA 87 12/10/2020 1251   Lab Results  Component Value Date   HGBA1C 5.2 09/06/2024   HGBA1C 5.3 08/19/2022   Lab Results  Component Value Date   INSULIN  4.8 11/17/2023   INSULIN  25.0 (H) 02/23/2023   Lab Results  Component Value Date   TSH 1.690 02/23/2023   CBC    Component Value Date/Time   WBC  4.7 07/07/2024 0937   RBC 4.45 07/07/2024 0937   HGB 13.6 07/07/2024 0937   HGB 12.8 05/19/2024 1455   HGB 13.9 01/04/2024 1352   HCT 41.6 07/07/2024 0937   HCT 42.1 01/04/2024 1352   PLT 267.0 07/07/2024 0937   PLT 276 05/19/2024 1455   PLT 291 01/04/2024 1352   MCV 93.7 07/07/2024 0937   MCV 95 01/04/2024 1352   MCH 30.5 05/19/2024 1455   MCHC 32.6 07/07/2024 0937   RDW 14.3 07/07/2024 0937   RDW 13.0 01/04/2024 1352   Iron Studies    Component Value Date/Time   IRON 68 10/27/2022 1056   TIBC 313 10/27/2022 1056   FERRITIN 41 10/27/2022 1056   IRONPCTSAT 22 10/27/2022 1056   Lipid Panel     Component Value Date/Time   CHOL 163 09/06/2024 0955   TRIG 59 09/06/2024 0955   HDL 57 09/06/2024 0955   LDLCALC 94  09/06/2024 0955   Hepatic Function Panel     Component Value Date/Time   PROT 6.4 10/23/2024 1340   ALBUMIN 3.9 10/23/2024 1340   AST 20 10/23/2024 1340   AST 7 (L) 05/19/2024 1455   ALT 56 (H) 10/23/2024 1340   ALT 5 05/19/2024 1455   ALKPHOS 97 10/23/2024 1340   BILITOT 0.2 10/23/2024 1340   BILITOT 0.3 05/19/2024 1455      Component Value Date/Time   TSH 1.690 02/23/2023 1305   Nutritional Lab Results  Component Value Date   VD25OH 55.6 02/23/2023   VD25OH 38.1 09/23/2021   VD25OH 34.1 12/22/2016    Medications: Outpatient Encounter Medications as of 10/24/2024  Medication Sig   acetaminophen  (TYLENOL ) 160 MG/5ML elixir Take 20 mLs by mouth 2 (two) times daily as needed for fever.   ARIPiprazole  (ABILIFY ) 5 MG tablet Take 5 mg by mouth daily.   baclofen  (LIORESAL ) 20 MG tablet TAKE 1 TABLET 3 TIMES A DAY   cholecalciferol  (VITAMIN D ) 1000 UNITS tablet Take 1,000 Units by mouth daily.   diphenhydrAMINE  (BENADRYL ) 25 mg capsule Take 25 mg by mouth every 6 (six) hours as needed for allergies.   DULoxetine  (CYMBALTA ) 30 MG capsule Take 30 mg by mouth daily.   ELIQUIS  5 MG TABS tablet TAKE 1 TABLET BY MOUTH TWICE A DAY   LORazepam  (ATIVAN ) 1 MG tablet Take 1 tablet (1 mg total) by mouth daily.   metFORMIN  (GLUCOPHAGE -XR) 500 MG 24 hr tablet Take 1 tablet (500 mg total) by mouth 2 (two) times daily with a meal.   Multiple Vitamins-Minerals (MULTIVITAMIN ADULT PO) Take 1 tablet by mouth daily.   nitrofurantoin, macrocrystal-monohydrate, (MACROBID) 100 MG capsule Take 1 capsule (100 mg total) by mouth 2 (two) times daily for 5 days.   nystatin  (MYCOSTATIN /NYSTOP ) powder Apply 1 Application topically 2 (two) times daily. (Patient taking differently: Apply 1 Application topically 2 (two) times daily as needed (skin irritation).)   ocrelizumab  (OCREVUS ) 300 MG/10ML injection Infuse 600 mg IV Every 6 months   oxybutynin  (DITROPAN -XL) 10 MG 24 hr tablet Take 2 tablets (20 mg total)  by mouth daily.   pantoprazole  (PROTONIX ) 40 MG tablet Take 1 tablet (40 mg total) by mouth daily. (Patient taking differently: Take 40 mg by mouth daily as needed (heartburn).)   polyethylene glycol powder (GLYCOLAX /MIRALAX ) 17 GM/SCOOP powder Take 17 g by mouth 2 (two) times daily as needed.   Potassium (POTASSIMIN PO) Take 1 tablet by mouth daily at 12 noon.   tiZANidine  (ZANAFLEX ) 4 MG tablet Take 1 tablet (  4 mg total) by mouth 3 (three) times daily.   topiramate  (TOPAMAX ) 50 MG tablet TAKE 1 TABLET TWICE A DAY   Facility-Administered Encounter Medications as of 10/24/2024  Medication   diphenhydrAMINE  (BENADRYL ) injection 50 mg   famotidine  (PEPCID ) 20 mg in sodium chloride  0.9 % 50 mL IVPB   methylPREDNISolone  sodium succinate (SOLU-MEDROL ) 125 mg in sodium chloride  0.9 % 50 mL IVPB   ocrelizumab  (OCREVUS ) 600 mg in sodium chloride  0.9 % 250 mL     Follow-Up   Return in about 4 weeks (around 11/21/2024) for For Weight Mangement with Dr. [email or web address].[email or web address] She was informed of the importance of frequent follow up visits to maximize her success with intensive lifestyle modifications for her multiple health conditions.  Attestation Statement   Reviewed by clinician on day of visit: allergies, medications, problem list, medical history, surgical history, family history, social history, and previous encounter notes.     Lucas Francyne, MD

## 2024-10-25 LAB — CMP14+EGFR
ALT: 56 IU/L — ABNORMAL HIGH (ref 0–32)
AST: 20 IU/L (ref 0–40)
Albumin: 3.9 g/dL (ref 3.9–4.9)
Alkaline Phosphatase: 97 IU/L (ref 41–116)
BUN/Creatinine Ratio: 14 (ref 9–23)
BUN: 10 mg/dL (ref 6–24)
Bilirubin Total: 0.2 mg/dL (ref 0.0–1.2)
CO2: 21 mmol/L (ref 20–29)
Calcium: 8.9 mg/dL (ref 8.7–10.2)
Chloride: 105 mmol/L (ref 96–106)
Creatinine, Ser: 0.7 mg/dL (ref 0.57–1.00)
Globulin, Total: 2.5 g/dL (ref 1.5–4.5)
Glucose: 83 mg/dL (ref 70–99)
Potassium: 3.8 mmol/L (ref 3.5–5.2)
Sodium: 139 mmol/L (ref 134–144)
Total Protein: 6.4 g/dL (ref 6.0–8.5)
eGFR: 107 mL/min/1.73 (ref >59)

## 2024-10-25 LAB — LACTIC ACID, PLASMA: Lactate, Ven: 13.5 mg/dL (ref 4.8–25.7)

## 2024-10-26 ENCOUNTER — Ambulatory Visit (HOSPITAL_COMMUNITY)
Admission: RE | Admit: 2024-10-26 | Discharge: 2024-10-26 | Disposition: A | Discharge status: Home / Self Care | Source: Ambulatory Visit

## 2024-10-26 DIAGNOSIS — M48061 Spinal stenosis, lumbar region without neurogenic claudication: Secondary | ICD-10-CM | POA: Diagnosis not present

## 2024-10-26 DIAGNOSIS — M2548 Effusion, other site: Secondary | ICD-10-CM | POA: Diagnosis not present

## 2024-10-26 DIAGNOSIS — R29898 Other symptoms and signs involving the musculoskeletal system: Secondary | ICD-10-CM | POA: Diagnosis not present

## 2024-10-26 DIAGNOSIS — G35D Multiple sclerosis, unspecified: Secondary | ICD-10-CM | POA: Diagnosis not present

## 2024-10-26 DIAGNOSIS — M4804 Spinal stenosis, thoracic region: Secondary | ICD-10-CM | POA: Diagnosis not present

## 2024-10-26 DIAGNOSIS — M47814 Spondylosis without myelopathy or radiculopathy, thoracic region: Secondary | ICD-10-CM | POA: Diagnosis not present

## 2024-10-26 DIAGNOSIS — M51369 Other intervertebral disc degeneration, lumbar region without mention of lumbar back pain or lower extremity pain: Secondary | ICD-10-CM | POA: Diagnosis not present

## 2024-10-26 DIAGNOSIS — M4316 Spondylolisthesis, lumbar region: Secondary | ICD-10-CM | POA: Diagnosis not present

## 2024-10-26 DIAGNOSIS — M5134 Other intervertebral disc degeneration, thoracic region: Secondary | ICD-10-CM | POA: Diagnosis not present

## 2024-10-26 MED ORDER — GADOBUTROL 1 MMOL/ML IV SOLN
10.0000 mL | Freq: Once | INTRAVENOUS | Status: AC | PRN
  Administered 2024-10-26: 10 mL via INTRAVENOUS

## 2024-10-31 ENCOUNTER — Ambulatory Visit: Payer: Self-pay | Admitting: Student

## 2024-11-04 DIAGNOSIS — R300 Dysuria: Secondary | ICD-10-CM | POA: Diagnosis not present

## 2024-11-04 DIAGNOSIS — R82998 Other abnormal findings in urine: Secondary | ICD-10-CM | POA: Diagnosis not present

## 2024-11-06 NOTE — Telephone Encounter (Signed)
 Will OT with [facility] called to Authorized discharge order from last week to this week due to [facility] number is [phone]

## 2024-11-07 NOTE — Telephone Encounter (Signed)
 Contacted Ebony Warren back, gave VO to Authorized discharge order from last week to this week. He verbally understood and was appreciative.

## 2024-11-14 ENCOUNTER — Telehealth: Payer: Self-pay | Admitting: Adult Health

## 2024-11-14 NOTE — Telephone Encounter (Signed)
 [facility]) Patient Urgent Care last week found she has a UTI, they prescribed Ciprofloxacin 500 mg. There is a drug interaction between DULoxetine  (CYMBALTA ) 30 MG capsule and interaction with tiZANidine  (ZANAFLEX ) 4 MG tablet. Would like a call back

## 2024-11-15 DIAGNOSIS — G35B0 Primary progressive multiple sclerosis, unspecified: Secondary | ICD-10-CM | POA: Diagnosis not present

## 2024-11-15 NOTE — Telephone Encounter (Signed)
 She can follow back up with UC or PCP to discuss other treatment options. Otherwise, she should avoid use of tizanidine  while on cipro. She is on a low dose of duloxetine  and should be okay to continue this with cipro.

## 2024-11-15 NOTE — Telephone Encounter (Addendum)
 Do you have a concern for pt being on Cipro for UTI and Zanaflex ? Our office does not manage pt Cymbalta .

## 2024-11-16 NOTE — Telephone Encounter (Signed)
 Attempted to contact Charisse back, left confidential VM with NP recommendations. Number provided to call back with questions.

## 2024-11-22 ENCOUNTER — Encounter (INDEPENDENT_AMBULATORY_CARE_PROVIDER_SITE_OTHER): Payer: Self-pay | Admitting: Internal Medicine

## 2024-11-22 ENCOUNTER — Other Ambulatory Visit (HOSPITAL_COMMUNITY): Payer: Self-pay | Admitting: Diagnostic Neuroimaging

## 2024-11-22 ENCOUNTER — Ambulatory Visit (INDEPENDENT_AMBULATORY_CARE_PROVIDER_SITE_OTHER): Admitting: Internal Medicine

## 2024-11-22 VITALS — BP 104/66 | HR 75 | Temp 97.9°F | Ht 68.0 in | Wt 216.0 lb

## 2024-11-22 DIAGNOSIS — I2694 Multiple subsegmental pulmonary emboli without acute cor pulmonale: Secondary | ICD-10-CM | POA: Diagnosis not present

## 2024-11-22 DIAGNOSIS — G40909 Epilepsy, unspecified, not intractable, without status epilepticus: Secondary | ICD-10-CM

## 2024-11-22 DIAGNOSIS — Z6834 Body mass index (BMI) 34.0-34.9, adult: Secondary | ICD-10-CM

## 2024-11-22 DIAGNOSIS — E88819 Insulin resistance, unspecified: Secondary | ICD-10-CM | POA: Diagnosis not present

## 2024-11-22 DIAGNOSIS — R600 Localized edema: Secondary | ICD-10-CM | POA: Diagnosis not present

## 2024-11-22 DIAGNOSIS — E66811 Obesity, class 1: Secondary | ICD-10-CM | POA: Diagnosis not present

## 2024-11-22 NOTE — Progress Notes (Signed)
 Office: [phone]  /  Fax: [phone]  Weight Summary and Body Composition Analysis (BIA)  Vitals Temp: 97.9 F (36.6 C) BP: 104/66 Pulse Rate: 75 SpO2: 100 %   Anthropometric Measurements Height: 5' 8 (1.727 m) Weight: 216 lb (98 kg) BMI (Calculated): 32.85 Weight at Last Visit: 219lb Weight Lost Since Last Visit: 3lb Weight Gained Since Last Visit: 0lb Starting Weight: 309lb Total Weight Loss (lbs): 93 lb (42.2 kg) Peak Weight: 312lb   No data recorded  No data recorded Today's Visit #: 23  Starting Date: 02/16/23   Subjective   Chief Complaint: Obesity  Interval History  Discussed the use of AI scribe software for clinical note transcription with the patient, who gave verbal consent to proceed.  History of Present Illness Ebony Warren is a 48 year old female with multiple sclerosis who presents for medical weight management.  Ebony Warren has lost over 95 pounds from her peak weight of 311 pounds in November 2022, achieving this through dietary changes and increased physical activity. Ebony Warren has reduced her intake of sweets, candy, and cakes, opting for fruits and occasionally allowing herself a mini candy bar. Her mother assists by varying her meals to prevent boredom. Ebony Warren maintains a caloric intake of 1200-1300 calories per day, with a current metabolic rate of approximately 1700 calories. Ebony Warren tracks her protein intake to ensure adequate muscle preservation, aiming for 30-40 grams per meal.  Ebony Warren has completed both physical and occupational therapy, which allowed her to return to the gym. Her gym routine includes 30-45 minutes on the stationary bike, hand weights for arm exercises, and core exercises using machines. At home, Ebony Warren performs leg extensions, kickbacks, and foot flexes to strengthen her left leg.  Regarding her multiple sclerosis, Ebony Warren reports no recent symptoms such as pain, numbness, or shooting pains. However, Ebony Warren notes weakness in her  right leg and hand, though occupational therapy has improved her hand function. No seizures have occurred since starting topiramate . Her current medications include Ocrevus , dalteparin, oxybutynin , a blood thinner, baclofen  three times a day, Cymbalta , lorazepam  daily, metformin , and pantoprazole  as needed for heartburn. Ebony Warren reports good sleep and no issues with leg swelling.     Challenges affecting patient progress: orthopedic problems, medical conditions or chronic pain affecting mobility.    Pharmacotherapy for weight management: Ebony Warren is currently taking Metformin  (off label use for weight management and / or insulin  resistance and / or diabetes prevention) with adequate clinical response  and without side effects. and Topiramate  (off label use, single agent) with adequate clinical response  and without side effects..   Assessment and Plan   Treatment Plan For Obesity:  Recommended Dietary Goals  Ebony Warren is currently in the action stage of change. As such, her goal is to continue weight management plan. Ebony Warren has agreed to: follow the Category 2 plan - 1200 kcal per day  Behavioral Health and Counseling  We discussed the following behavioral modification strategies today: continue to work on maintaining a reduced calorie state, getting the recommended amount of protein, incorporating whole foods, making healthy choices, staying well hydrated and practicing mindfulness when eating. and increase protein intake, fibrous foods (25 grams per day for women, 30 grams for men) and water to improve satiety and decrease hunger signals. .  Additional education and resources provided today: None  Recommended Physical Activity Goals  Ebony Warren has been advised to work up to 150 minutes of moderate intensity aerobic activity a week and strengthening exercises 2-3 times per week for  cardiovascular health, weight loss maintenance and preservation of muscle mass.  Ebony Warren has agreed to :  Think about  enjoyable ways to increase daily physical activity and overcoming barriers to exercise, Increase physical activity in their day and reduce sedentary time (increase NEAT)., Increase volume of physical activity to a goal of 240 minutes a week, and Combine aerobic and strengthening exercises for efficiency and improved cardiometabolic health.  Medical Interventions and Pharmacotherapy  We discussed various medication options to help Ebony Warren with her weight loss efforts and we both agreed to : Adequate clinical response to anti-obesity medication, continue current regimen  Associated Conditions Impacted by Obesity Treatment  Assessment & Plan Seizure disorder (HCC) Stable on topiramate .  Bicarbonate has stabilized. Class 1 obesity with serious comorbidity and body mass index (BMI) of 34.0 to 34.9 in adult, unspecified obesity type Insulin  resistance Significant weight loss of 97 pounds over two years, from a peak weight of 311 pounds to 214 pounds. No plateau in weight loss curve. No challenges with diet or appetite control. Engages in regular exercise including cycling, hand weights, and core exercises. Hand grip strength is normal at 49 pounds on the left.  Protein intake is monitored to preserve muscle mass, especially important due to MS. - Continue current caloric intake of 1200-1300 calories per day. - Monitor protein intake to ensure 30-40 grams per meal. - Continue regular exercise routine at the gym, including cycling, hand weights, and core exercises. - Continue home exercises for left leg strengthening. - Will schedule follow-up appointments every four weeks. -Continue metformin  for weight management and insulin  resistance. Leg edema Improved Multiple subsegmental pulmonary emboli without acute cor pulmonale (HCC) On Eliquis  5 mg twice a day.  Stable blood counts no signs or symptoms of active bleeding          Objective   Physical Exam:  Blood pressure 104/66, pulse 75,  temperature 97.9 F (36.6 C), height 5' 8 (1.727 m), weight 216 lb (98 kg), SpO2 100%. Body mass index is 32.84 kg/m.  General: Ebony Warren is overweight, cooperative, alert, well developed, and in no acute distress. PSYCH: Has normal mood, affect and thought process.   HEENT: EOMI, sclerae are anicteric. Lungs: Normal breathing effort, no conversational dyspnea. Extremities: No edema.  Neurologic: No gross sensory or motor deficits. No tremors or fasciculations noted.    Diagnostic Data Reviewed:  BMET    Component Value Date/Time   NA 139 10/23/2024 1340   K 3.8 10/23/2024 1340   CL 105 10/23/2024 1340   CO2 21 10/23/2024 1340   GLUCOSE 83 10/23/2024 1340   GLUCOSE 82 05/19/2024 1455   BUN 10 10/23/2024 1340   CREATININE 0.70 10/23/2024 1340   CREATININE 0.70 05/19/2024 1455   CALCIUM 8.9 10/23/2024 1340   GFRNONAA >60 05/19/2024 1455   GFRAA 87 12/10/2020 1251   Lab Results  Component Value Date   HGBA1C 5.2 09/06/2024   HGBA1C 5.3 08/19/2022   Lab Results  Component Value Date   INSULIN  4.8 11/17/2023   INSULIN  25.0 (H) 02/23/2023   Lab Results  Component Value Date   TSH 1.690 02/23/2023   CBC    Component Value Date/Time   WBC 4.7 07/07/2024 0937   RBC 4.45 07/07/2024 0937   HGB 13.6 07/07/2024 0937   HGB 12.8 05/19/2024 1455   HGB 13.9 01/04/2024 1352   HCT 41.6 07/07/2024 0937   HCT 42.1 01/04/2024 1352   PLT 267.0 07/07/2024 0937   PLT 276 05/19/2024 1455  PLT 291 01/04/2024 1352   MCV 93.7 07/07/2024 0937   MCV 95 01/04/2024 1352   MCH 30.5 05/19/2024 1455   MCHC 32.6 07/07/2024 0937   RDW 14.3 07/07/2024 0937   RDW 13.0 01/04/2024 1352   Iron Studies    Component Value Date/Time   IRON 68 10/27/2022 1056   TIBC 313 10/27/2022 1056   FERRITIN 41 10/27/2022 1056   IRONPCTSAT 22 10/27/2022 1056   Lipid Panel     Component Value Date/Time   CHOL 163 09/06/2024 0955   TRIG 59 09/06/2024 0955   HDL 57 09/06/2024 0955   LDLCALC 94 09/06/2024  0955   Hepatic Function Panel     Component Value Date/Time   PROT 6.4 10/23/2024 1340   ALBUMIN 3.9 10/23/2024 1340   AST 20 10/23/2024 1340   AST 7 (L) 05/19/2024 1455   ALT 56 (H) 10/23/2024 1340   ALT 5 05/19/2024 1455   ALKPHOS 97 10/23/2024 1340   BILITOT 0.2 10/23/2024 1340   BILITOT 0.3 05/19/2024 1455      Component Value Date/Time   TSH 1.690 02/23/2023 1305   Nutritional Lab Results  Component Value Date   VD25OH 55.6 02/23/2023   VD25OH 38.1 09/23/2021   VD25OH 34.1 12/22/2016    Medications: Outpatient Encounter Medications as of 11/22/2024  Medication Sig   acetaminophen  (TYLENOL ) 160 MG/5ML elixir Take 20 mLs by mouth 2 (two) times daily as needed for fever.   ARIPiprazole  (ABILIFY ) 5 MG tablet Take 5 mg by mouth daily.   baclofen  (LIORESAL ) 20 MG tablet TAKE 1 TABLET 3 TIMES A DAY   cholecalciferol  (VITAMIN D ) 1000 UNITS tablet Take 1,000 Units by mouth daily.   diphenhydrAMINE  (BENADRYL ) 25 mg capsule Take 25 mg by mouth every 6 (six) hours as needed for allergies.   DULoxetine  (CYMBALTA ) 30 MG capsule Take 30 mg by mouth daily.   ELIQUIS  5 MG TABS tablet TAKE 1 TABLET BY MOUTH TWICE A DAY   LORazepam  (ATIVAN ) 1 MG tablet Take 1 tablet (1 mg total) by mouth daily.   metFORMIN  (GLUCOPHAGE -XR) 500 MG 24 hr tablet Take 1 tablet (500 mg total) by mouth 2 (two) times daily with a meal.   Multiple Vitamins-Minerals (MULTIVITAMIN ADULT PO) Take 1 tablet by mouth daily.   nystatin  (MYCOSTATIN /NYSTOP ) powder Apply 1 Application topically 2 (two) times daily. (Patient taking differently: Apply 1 Application topically 2 (two) times daily as needed (skin irritation).)   ocrelizumab  (OCREVUS ) 300 MG/10ML injection Infuse 600 mg IV Every 6 months   oxybutynin  (DITROPAN -XL) 10 MG 24 hr tablet Take 2 tablets (20 mg total) by mouth daily.   pantoprazole  (PROTONIX ) 40 MG tablet Take 1 tablet (40 mg total) by mouth daily. (Patient taking differently: Take 40 mg by mouth daily  as needed (heartburn).)   polyethylene glycol powder (GLYCOLAX /MIRALAX ) 17 GM/SCOOP powder Take 17 g by mouth 2 (two) times daily as needed.   Potassium (POTASSIMIN PO) Take 1 tablet by mouth daily at 12 noon.   tiZANidine  (ZANAFLEX ) 4 MG tablet Take 1 tablet (4 mg total) by mouth 3 (three) times daily.   topiramate  (TOPAMAX ) 50 MG tablet TAKE 1 TABLET TWICE A DAY   Facility-Administered Encounter Medications as of 11/22/2024  Medication   diphenhydrAMINE  (BENADRYL ) injection 50 mg   famotidine  (PEPCID ) 20 mg in sodium chloride  0.9 % 50 mL IVPB   methylPREDNISolone  sodium succinate (SOLU-MEDROL ) 125 mg in sodium chloride  0.9 % 50 mL IVPB   ocrelizumab  (OCREVUS ) 600 mg in  sodium chloride  0.9 % 250 mL     Follow-Up   Return in about 4 weeks (around 12/20/2024) for For Weight Mangement with Dr. Francyne.SABRA Ebony Warren was informed of the importance of frequent follow up visits to maximize her success with intensive lifestyle modifications for her multiple health conditions.  Attestation Statement   Reviewed by clinician on day of visit: allergies, medications, problem list, medical history, surgical history, family history, social history, and previous encounter notes.     Lucas Francyne, MD

## 2024-11-22 NOTE — Assessment & Plan Note (Signed)
 On Eliquis  5 mg twice a day.  Stable blood counts no signs or symptoms of active bleeding

## 2024-11-22 NOTE — Assessment & Plan Note (Signed)
 Improved.

## 2024-11-22 NOTE — Assessment & Plan Note (Signed)
 Significant weight loss of 97 pounds over two years, from a peak weight of 311 pounds to 214 pounds. No plateau in weight loss curve. No challenges with diet or appetite control. Engages in regular exercise including cycling, hand weights, and core exercises. Hand grip strength is normal at 49 pounds on the left.  Protein intake is monitored to preserve muscle mass, especially important due to MS. - Continue current caloric intake of 1200-1300 calories per day. - Monitor protein intake to ensure 30-40 grams per meal. - Continue regular exercise routine at the gym, including cycling, hand weights, and core exercises. - Continue home exercises for left leg strengthening. - Will schedule follow-up appointments every four weeks. -Continue metformin  for weight management and insulin  resistance.

## 2024-11-22 NOTE — Assessment & Plan Note (Signed)
 Stable on topiramate .  Bicarbonate has stabilized.

## 2024-11-24 ENCOUNTER — Telehealth (HOSPITAL_COMMUNITY): Payer: Self-pay | Admitting: Pharmacy Technician

## 2024-11-24 NOTE — Telephone Encounter (Signed)
 Auth Submission: APPROVED Site of care: [facility] Payer: [employer] MED[employer]ARE Medication & CPT/J Code(s) submitted: Ocrevus  (Ocrelizumab ) J2350 Diagnosis Code: G35.D (MS) Route of submission (phone, fax, portal): FAX Phone # Fax # Auth type: Buy/Bill HB Units/visits requested: 600MG  EVERY 6 MONTHS Reference number: [id number]   Approval from: 11/22/24 to 11/22/25    Dagoberto Armour, CPhT [facility] Phone: [phone] 11/24/2024

## 2024-12-04 ENCOUNTER — Encounter: Payer: Self-pay | Admitting: *Deleted

## 2024-12-20 ENCOUNTER — Ambulatory Visit (HOSPITAL_COMMUNITY)

## 2024-12-20 ENCOUNTER — Telehealth: Payer: Self-pay

## 2024-12-20 ENCOUNTER — Ambulatory Visit (HOSPITAL_COMMUNITY)
Admission: RE | Admit: 2024-12-20 | Discharge: 2024-12-20 | Disposition: A | Discharge status: Home / Self Care | Source: Ambulatory Visit | Attending: Internal Medicine | Admitting: Internal Medicine

## 2024-12-20 VITALS — BP 138/89 | HR 89 | Temp 98.2°F | Resp 20

## 2024-12-20 DIAGNOSIS — G35D Multiple sclerosis, unspecified: Secondary | ICD-10-CM | POA: Insufficient documentation

## 2024-12-20 MED ORDER — METHYLPREDNISOLONE SODIUM SUCC 125 MG IJ SOLR
125.0000 mg | Freq: Once | INTRAMUSCULAR | Status: AC
  Administered 2024-12-20: 125 mg via INTRAVENOUS
  Filled 2024-12-20: qty 2

## 2024-12-20 MED ORDER — DIPHENHYDRAMINE HCL 50 MG/ML IJ SOLN
50.0000 mg | Freq: Once | INTRAMUSCULAR | Status: AC
  Administered 2024-12-20: 50 mg via INTRAVENOUS
  Filled 2024-12-20: qty 1

## 2024-12-20 MED ORDER — SODIUM CHLORIDE 0.9 % IV SOLN
600.0000 mg | Freq: Once | INTRAVENOUS | Status: AC
  Administered 2024-12-20: 600 mg via INTRAVENOUS
  Filled 2024-12-20: qty 20

## 2024-12-20 MED ORDER — ACETAMINOPHEN 325 MG PO TABS
650.0000 mg | ORAL_TABLET | Freq: Once | ORAL | Status: AC
  Administered 2024-12-20: 650 mg via ORAL
  Filled 2024-12-20: qty 2

## 2024-12-20 MED ORDER — FAMOTIDINE IN NACL 20-0.9 MG/50ML-% IV SOLN
20.0000 mg | Freq: Two times a day (BID) | INTRAVENOUS | Status: DC
  Administered 2024-12-20: 20 mg via INTRAVENOUS
  Filled 2024-12-20: qty 50

## 2024-12-20 NOTE — Progress Notes (Signed)
 Diagnosis: multiple sclerosis    Provider: Margaret Carne    Procedure: Ocrevus  600 mg     Note: Patient given Ocrevus  600 mg via PIV after receiving her pre meds , Tylenol ,PO solu medrol , IVP pepcid  IVPBand Benadryl  PO. Patient tolerated infusion well.  Patient tolerated infusion without reaction.  Pt is alert, oriented and in her own motorized wheelchair at discharge.  She is advised to stop by checkout desk to make her next , since she receives every 6 months.  Patient declined AVS, patient declined post observation.

## 2024-12-20 NOTE — Telephone Encounter (Signed)
 Occupational Therapy orders faxed to [facility] on 12/20/2024

## 2024-12-21 ENCOUNTER — Telehealth: Payer: Self-pay | Admitting: *Deleted

## 2024-12-21 DIAGNOSIS — Z79899 Other long term (current) drug therapy: Secondary | ICD-10-CM

## 2024-12-21 DIAGNOSIS — G35A Relapsing-remitting multiple sclerosis: Secondary | ICD-10-CM

## 2024-12-21 NOTE — Telephone Encounter (Signed)
 Received message from patient care center noting pt's mobility decline overtime and possible transition to home infusions if this an option for her. The outpatient clinic does not have lift equipment there. They had to help her lift her legs in the bed and turn her.   Upon review of pt's last OV 09/12/24:  Patient returns for follow-up visit after prior visit most 1 year ago.  She remains on Ocrevus  infusions with last infusion in July.  Notes gradual progression over the past couple of months and currently unable to move her right leg, limited use of right hand with increased stiffness and incontinent of urine.  She uses electric wheelchair, previously able to stand/pivot for transfers and now needs assistance as she has no use of her right leg, her mother will move her leg for her.  She has had multiple falls because of this which requires EMS assistance.  She does live with her mother who assists as needed but unable to pick her up off the ground. She is requesting a floor chair lift to assist getting her up after a fall and requesting a foldable transport chair as sometimes it is difficult to use electric wheelchair in certain places and can be challenging with transport.  She remains on baclofen  and tizanidine  3 times daily, denies side effects.  She remains on Topamax  50 mg nightly, recently decreased due to low bicarb level.  Denies any changes in vision.  Will check with provider to see if we can transition patient to home infusion and then I'll call patient. I see she is already getting home PT & OT but this is through [facility] and it doesn't look like they do these infusions.

## 2024-12-21 NOTE — Telephone Encounter (Signed)
 Additional Occupational Therapy orders faxed to [facility] on 12/21/2024

## 2024-12-21 NOTE — Telephone Encounter (Signed)
 I have only seen her once but based on her level of debility, I think receiving home infusions would be reasonable but will forward to Dr. Margaret for further input.

## 2024-12-26 ENCOUNTER — Ambulatory Visit (INDEPENDENT_AMBULATORY_CARE_PROVIDER_SITE_OTHER): Admitting: Internal Medicine

## 2024-12-26 NOTE — Telephone Encounter (Signed)
 Pt called and LVM requesting for a call back.

## 2024-12-26 NOTE — Telephone Encounter (Signed)
 [facility] referral filled out. Ocrevus  for home infusion.  Please sign and do premeds.

## 2024-12-26 NOTE — Telephone Encounter (Signed)
 I called pt and relayed due to her mobility issues if she was ok to change her venue to home infusion for her Ocrevus .  She said that was  fine.  I relayed that [facility] specialty pharmacy that can service her.  They use local home infusion nurses.   Her DMT  Tecfidera  and Ocrevus .  She will need hepatitis labs (last done that I see 7 yrs ago).

## 2024-12-26 NOTE — Telephone Encounter (Signed)
 I called pt and LVM (ok per DPR) asking for call back. Would like to see if she would be interested in transitioning to home Ocrevus  infusions.

## 2024-12-27 NOTE — Telephone Encounter (Addendum)
 Order signed.  [facility] faxed order,demog/ insurance card/last 2 OV/ med list. 30pgs. [phone] fax, ov [phone].

## 2024-12-28 NOTE — Addendum Note (Signed)
 Addended by: NEYSA PARTICIA RAMAN on: 12/28/2024 02:09 PM   Modules accepted: Orders

## 2024-12-28 NOTE — Telephone Encounter (Signed)
 Received from [facility] that they are in need of Hep B and IgG levels for pt.  Ok to order then once resulted will get to them.  Her last IgG level was 06-2024.  Hep B 38yrs ago.

## 2025-01-01 NOTE — Addendum Note (Signed)
 Addended by: MARGARET CARNE R on: 01/01/2025 01:59 PM   Modules accepted: Orders

## 2025-01-01 NOTE — Telephone Encounter (Signed)
 Orders Placed This Encounter  Procedures   Hep B Surface Antigen   Hepatitis B Core AB, Total   IgG, IgA, IgM   Hepatitis B surface antibody,qualitative   Hep B Surface Antigen   Hepatitis B core antibody, total   Immunoglobulins, QN, A/E/G/M   EDUARD FABIENE HANLON, MD 01/01/2025, 1:59 PM Certified in Neurology, Neurophysiology and Neuroimaging  [facility] [address] [place], [place] [address] [phone]

## 2025-01-02 NOTE — Addendum Note (Signed)
 Addended by: NEYSA PARTICIA RAMAN on: 01/02/2025 10:51 AM   Modules accepted: Orders

## 2025-01-03 ENCOUNTER — Ambulatory Visit (INDEPENDENT_AMBULATORY_CARE_PROVIDER_SITE_OTHER): Admitting: Internal Medicine

## 2025-01-04 NOTE — Telephone Encounter (Signed)
 Received [facility] order for Dr Margaret to sign for Ocrevus  600 mg IV every 6 months. Order is in his office.

## 2025-01-05 ENCOUNTER — Other Ambulatory Visit: Payer: Self-pay | Admitting: Diagnostic Neuroimaging

## 2025-01-05 ENCOUNTER — Other Ambulatory Visit (INDEPENDENT_AMBULATORY_CARE_PROVIDER_SITE_OTHER): Payer: Self-pay | Admitting: Internal Medicine

## 2025-01-05 DIAGNOSIS — L304 Erythema intertrigo: Secondary | ICD-10-CM

## 2025-01-08 NOTE — Telephone Encounter (Signed)
 Last refilled by patient on : 04/06/24 Last office visit : 09/12/24 Next office visit : 03/26/25 Per last office note: continue tizanidine  4mg  three times per day for muscle spasms  Will call patient when back in office to see if she is still taking medication

## 2025-01-09 ENCOUNTER — Other Ambulatory Visit: Payer: Self-pay

## 2025-01-09 ENCOUNTER — Telehealth: Payer: Self-pay

## 2025-01-09 DIAGNOSIS — U071 COVID-19: Secondary | ICD-10-CM

## 2025-01-09 MED ORDER — PANTOPRAZOLE SODIUM 40 MG PO TBEC: 40.0000 mg | DELAYED_RELEASE_TABLET | Freq: Every day | ORAL | 2 refills | Status: AC | PRN

## 2025-01-17 ENCOUNTER — Ambulatory Visit: Payer: Self-pay

## 2025-01-18 ENCOUNTER — Ambulatory Visit (INDEPENDENT_AMBULATORY_CARE_PROVIDER_SITE_OTHER): Admitting: Internal Medicine

## 2025-03-26 ENCOUNTER — Ambulatory Visit: Admitting: Diagnostic Neuroimaging

## 2025-06-13 ENCOUNTER — Ambulatory Visit

## 2025-06-19 ENCOUNTER — Ambulatory Visit (HOSPITAL_COMMUNITY)
